# Patient Record
Sex: Male | Born: 1940 | Race: White | Hispanic: No | Marital: Married | Smoking: Never smoker
Health system: Southern US, Community
[De-identification: ages and names within clinical notes are randomized; demographics above are authoritative.]

## PROBLEM LIST (undated history)

## (undated) DIAGNOSIS — G600 Hereditary motor and sensory neuropathy: Secondary | ICD-10-CM

## (undated) DIAGNOSIS — IMO0002 Reserved for concepts with insufficient information to code with codable children: Secondary | ICD-10-CM

## (undated) DIAGNOSIS — E785 Hyperlipidemia, unspecified: Secondary | ICD-10-CM

## (undated) DIAGNOSIS — E119 Type 2 diabetes mellitus without complications: Secondary | ICD-10-CM

## (undated) DIAGNOSIS — N3281 Overactive bladder: Secondary | ICD-10-CM

## (undated) DIAGNOSIS — D509 Iron deficiency anemia, unspecified: Secondary | ICD-10-CM

## (undated) DIAGNOSIS — I10 Essential (primary) hypertension: Secondary | ICD-10-CM

## (undated) DIAGNOSIS — M1990 Unspecified osteoarthritis, unspecified site: Secondary | ICD-10-CM

## (undated) DIAGNOSIS — K259 Gastric ulcer, unspecified as acute or chronic, without hemorrhage or perforation: Secondary | ICD-10-CM

## (undated) DIAGNOSIS — K5790 Diverticulosis of intestine, part unspecified, without perforation or abscess without bleeding: Secondary | ICD-10-CM

## (undated) DIAGNOSIS — H9190 Unspecified hearing loss, unspecified ear: Secondary | ICD-10-CM

## (undated) DIAGNOSIS — I209 Angina pectoris, unspecified: Secondary | ICD-10-CM

## (undated) HISTORY — DX: Angina pectoris, unspecified: I20.9

## (undated) HISTORY — DX: Hyperlipidemia, unspecified: E78.5

## (undated) HISTORY — DX: Overactive bladder: N32.81

## (undated) HISTORY — DX: Iron deficiency anemia, unspecified: D50.9

## (undated) HISTORY — DX: Unspecified osteoarthritis, unspecified site: M19.90

## (undated) HISTORY — PX: FOOT AMPUTATION THROUGH METATARSAL: SHX644

## (undated) HISTORY — DX: Diverticulosis of intestine, part unspecified, without perforation or abscess without bleeding: K57.90

## (undated) HISTORY — PX: FOOT SURGERY: SHX648

## (undated) HISTORY — DX: Hereditary motor and sensory neuropathy: G60.0

## (undated) HISTORY — PX: TOTAL HIP ARTHROPLASTY: SHX124

## (undated) HISTORY — DX: Gastric ulcer, unspecified as acute or chronic, without hemorrhage or perforation: K25.9

## (undated) HISTORY — DX: Type 2 diabetes mellitus without complications: E11.9

## (undated) HISTORY — DX: Unspecified hearing loss, unspecified ear: H91.90

## (undated) HISTORY — DX: Essential (primary) hypertension: I10

## (undated) HISTORY — DX: Reserved for concepts with insufficient information to code with codable children: IMO0002

---

## 1997-09-11 HISTORY — PX: TOTAL KNEE ARTHROPLASTY: SHX125

## 1998-06-18 ENCOUNTER — Encounter: Payer: Self-pay | Admitting: Orthopedic Surgery

## 1998-06-18 ENCOUNTER — Inpatient Hospital Stay (HOSPITAL_COMMUNITY): Admission: RE | Admit: 1998-06-18 | Discharge: 1998-06-22 | Payer: Self-pay | Admitting: Orthopedic Surgery

## 1998-06-25 ENCOUNTER — Encounter (HOSPITAL_COMMUNITY): Admission: RE | Admit: 1998-06-25 | Discharge: 1998-09-23 | Payer: Self-pay | Admitting: Orthopedic Surgery

## 1998-07-21 ENCOUNTER — Encounter: Admission: RE | Admit: 1998-07-21 | Discharge: 1998-10-04 | Payer: Self-pay

## 1998-08-17 ENCOUNTER — Encounter: Payer: Self-pay | Admitting: Orthopedic Surgery

## 1998-08-24 ENCOUNTER — Inpatient Hospital Stay (HOSPITAL_COMMUNITY): Admission: RE | Admit: 1998-08-24 | Discharge: 1998-08-28 | Payer: Self-pay | Admitting: Orthopedic Surgery

## 1998-10-04 ENCOUNTER — Encounter: Admission: RE | Admit: 1998-10-04 | Discharge: 1998-11-05 | Payer: Self-pay | Admitting: Orthopedic Surgery

## 1999-10-14 ENCOUNTER — Encounter: Admission: RE | Admit: 1999-10-14 | Discharge: 1999-10-14 | Payer: Self-pay | Admitting: *Deleted

## 1999-10-14 ENCOUNTER — Encounter: Payer: Self-pay | Admitting: *Deleted

## 2000-04-25 ENCOUNTER — Encounter: Admission: RE | Admit: 2000-04-25 | Discharge: 2000-07-24 | Payer: Self-pay | Admitting: Internal Medicine

## 2000-06-12 ENCOUNTER — Encounter: Payer: Self-pay | Admitting: Surgery

## 2000-07-03 ENCOUNTER — Encounter: Payer: Self-pay | Admitting: Orthopedic Surgery

## 2000-07-17 ENCOUNTER — Encounter: Payer: Self-pay | Admitting: Orthopedic Surgery

## 2000-07-24 ENCOUNTER — Encounter: Admission: RE | Admit: 2000-07-24 | Discharge: 2000-10-22 | Payer: Self-pay | Admitting: Orthopedic Surgery

## 2000-10-18 ENCOUNTER — Encounter: Admission: RE | Admit: 2000-10-18 | Discharge: 2000-10-18 | Payer: Self-pay | Admitting: Internal Medicine

## 2000-10-18 ENCOUNTER — Encounter: Payer: Self-pay | Admitting: Internal Medicine

## 2000-10-23 ENCOUNTER — Encounter: Admission: RE | Admit: 2000-10-23 | Discharge: 2001-01-21 | Payer: Self-pay | Admitting: Orthopedic Surgery

## 2000-11-29 ENCOUNTER — Encounter: Payer: Self-pay | Admitting: Orthopedic Surgery

## 2000-11-29 ENCOUNTER — Ambulatory Visit (HOSPITAL_COMMUNITY): Admission: RE | Admit: 2000-11-29 | Discharge: 2000-11-29 | Payer: Self-pay | Admitting: Orthopedic Surgery

## 2001-01-20 ENCOUNTER — Encounter: Payer: Self-pay | Admitting: Orthopedic Surgery

## 2001-01-20 ENCOUNTER — Ambulatory Visit (HOSPITAL_COMMUNITY): Admission: RE | Admit: 2001-01-20 | Discharge: 2001-01-20 | Payer: Self-pay | Admitting: Orthopedic Surgery

## 2001-01-29 ENCOUNTER — Encounter: Admission: RE | Admit: 2001-01-29 | Discharge: 2001-04-10 | Payer: Self-pay | Admitting: Orthopedic Surgery

## 2001-02-01 ENCOUNTER — Encounter: Payer: Self-pay | Admitting: Orthopedic Surgery

## 2001-02-06 ENCOUNTER — Inpatient Hospital Stay (HOSPITAL_COMMUNITY): Admission: RE | Admit: 2001-02-06 | Discharge: 2001-02-08 | Payer: Self-pay | Admitting: Orthopedic Surgery

## 2001-02-06 ENCOUNTER — Encounter: Payer: Self-pay | Admitting: Orthopedic Surgery

## 2001-02-08 ENCOUNTER — Inpatient Hospital Stay (HOSPITAL_COMMUNITY)
Admission: RE | Admit: 2001-02-08 | Discharge: 2001-02-12 | Payer: Self-pay | Admitting: Physical Medicine & Rehabilitation

## 2001-02-20 ENCOUNTER — Encounter
Admission: RE | Admit: 2001-02-20 | Discharge: 2001-04-04 | Payer: Self-pay | Admitting: Physical Medicine & Rehabilitation

## 2001-05-09 ENCOUNTER — Ambulatory Visit (HOSPITAL_BASED_OUTPATIENT_CLINIC_OR_DEPARTMENT_OTHER): Admission: RE | Admit: 2001-05-09 | Discharge: 2001-05-09 | Payer: Self-pay | Admitting: Orthopedic Surgery

## 2001-08-06 ENCOUNTER — Ambulatory Visit: Admission: RE | Admit: 2001-08-06 | Discharge: 2001-08-06 | Payer: Self-pay | Admitting: Orthopedic Surgery

## 2001-08-16 ENCOUNTER — Encounter: Admission: RE | Admit: 2001-08-16 | Discharge: 2001-11-14 | Payer: Self-pay

## 2001-08-23 ENCOUNTER — Encounter: Admission: RE | Admit: 2001-08-23 | Discharge: 2001-11-21 | Payer: Self-pay

## 2001-10-31 ENCOUNTER — Encounter: Admission: RE | Admit: 2001-10-31 | Discharge: 2001-10-31 | Payer: Self-pay | Admitting: Internal Medicine

## 2001-10-31 ENCOUNTER — Encounter: Payer: Self-pay | Admitting: Internal Medicine

## 2002-01-20 ENCOUNTER — Encounter: Admission: RE | Admit: 2002-01-20 | Discharge: 2002-04-20 | Payer: Self-pay

## 2002-05-01 ENCOUNTER — Encounter: Admission: RE | Admit: 2002-05-01 | Discharge: 2002-06-10 | Payer: Self-pay

## 2002-06-18 ENCOUNTER — Encounter: Payer: Self-pay | Admitting: Orthopedic Surgery

## 2002-06-20 ENCOUNTER — Inpatient Hospital Stay (HOSPITAL_COMMUNITY): Admission: RE | Admit: 2002-06-20 | Discharge: 2002-06-25 | Payer: Self-pay | Admitting: Orthopedic Surgery

## 2002-06-20 ENCOUNTER — Encounter: Payer: Self-pay | Admitting: Orthopedic Surgery

## 2002-06-23 ENCOUNTER — Encounter: Payer: Self-pay | Admitting: Orthopedic Surgery

## 2003-06-09 ENCOUNTER — Encounter
Admission: RE | Admit: 2003-06-09 | Discharge: 2003-09-07 | Payer: Self-pay | Admitting: Physical Medicine & Rehabilitation

## 2003-06-16 ENCOUNTER — Encounter
Admission: RE | Admit: 2003-06-16 | Discharge: 2003-07-07 | Payer: Self-pay | Admitting: Physical Medicine & Rehabilitation

## 2003-11-16 ENCOUNTER — Encounter
Admission: RE | Admit: 2003-11-16 | Discharge: 2004-02-14 | Payer: Self-pay | Admitting: Physical Medicine & Rehabilitation

## 2003-12-30 ENCOUNTER — Encounter (HOSPITAL_BASED_OUTPATIENT_CLINIC_OR_DEPARTMENT_OTHER): Admission: RE | Admit: 2003-12-30 | Discharge: 2004-01-05 | Payer: Self-pay | Admitting: Internal Medicine

## 2004-03-11 ENCOUNTER — Encounter
Admission: RE | Admit: 2004-03-11 | Discharge: 2004-06-09 | Payer: Self-pay | Admitting: Physical Medicine & Rehabilitation

## 2008-03-06 ENCOUNTER — Encounter
Admission: RE | Admit: 2008-03-06 | Discharge: 2008-05-01 | Payer: Self-pay | Admitting: Physical Medicine & Rehabilitation

## 2008-03-10 ENCOUNTER — Ambulatory Visit: Payer: Self-pay | Admitting: Physical Medicine & Rehabilitation

## 2008-04-02 ENCOUNTER — Encounter
Admission: RE | Admit: 2008-04-02 | Discharge: 2008-04-02 | Payer: Self-pay | Admitting: Physical Medicine & Rehabilitation

## 2008-04-02 ENCOUNTER — Ambulatory Visit: Payer: Self-pay | Admitting: Physical Medicine & Rehabilitation

## 2008-05-01 ENCOUNTER — Ambulatory Visit (HOSPITAL_COMMUNITY)
Admission: RE | Admit: 2008-05-01 | Discharge: 2008-05-01 | Payer: Self-pay | Admitting: Physical Medicine & Rehabilitation

## 2008-05-01 ENCOUNTER — Ambulatory Visit: Payer: Self-pay | Admitting: Physical Medicine & Rehabilitation

## 2008-12-22 ENCOUNTER — Encounter: Admission: RE | Admit: 2008-12-22 | Discharge: 2008-12-22 | Payer: Self-pay | Admitting: Family Medicine

## 2009-12-03 ENCOUNTER — Encounter (INDEPENDENT_AMBULATORY_CARE_PROVIDER_SITE_OTHER): Payer: Self-pay | Admitting: Orthopedic Surgery

## 2009-12-03 ENCOUNTER — Inpatient Hospital Stay (HOSPITAL_COMMUNITY): Admission: RE | Admit: 2009-12-03 | Discharge: 2009-12-06 | Payer: Self-pay | Admitting: Orthopedic Surgery

## 2010-10-21 ENCOUNTER — Ambulatory Visit (HOSPITAL_BASED_OUTPATIENT_CLINIC_OR_DEPARTMENT_OTHER)
Admission: RE | Admit: 2010-10-21 | Discharge: 2010-10-21 | Disposition: A | Discharge status: Home / Self Care | Payer: MEDICARE | Source: Ambulatory Visit | Attending: Orthopedic Surgery | Admitting: Orthopedic Surgery

## 2010-10-21 DIAGNOSIS — M9080 Osteopathy in diseases classified elsewhere, unspecified site: Secondary | ICD-10-CM | POA: Insufficient documentation

## 2010-10-21 DIAGNOSIS — E1169 Type 2 diabetes mellitus with other specified complication: Secondary | ICD-10-CM | POA: Insufficient documentation

## 2010-10-21 DIAGNOSIS — M86649 Other chronic osteomyelitis, unspecified hand: Secondary | ICD-10-CM | POA: Insufficient documentation

## 2010-10-21 DIAGNOSIS — G600 Hereditary motor and sensory neuropathy: Secondary | ICD-10-CM | POA: Insufficient documentation

## 2010-10-24 LAB — WOUND CULTURE: Gram Stain: NONE SEEN

## 2010-10-26 LAB — ANAEROBIC CULTURE: Gram Stain: NONE SEEN

## 2010-10-31 NOTE — Op Note (Signed)
  NAMEILYAAS, MUSTO                ACCOUNT NO.:  [id number]  MEDICAL RECORD NO.:  [id number]          PATIENT TYPE:  LOCATION:                                 FACILITY:  PHYSICIAN:  Cindee Salt, M.D.            DATE OF BIRTH:  DATE OF PROCEDURE:  10/21/2010 DATE OF DISCHARGE:                              OPERATIVE REPORT   PREOPERATIVE DIAGNOSIS:  Chronic osteomyelitis, right middle finger.  POSTOPERATIVE DIAGNOSIS:  Chronic osteomyelitis, right middle finger.  OPERATION:  Incision and drainage with cultures, right middle finger.  SURGEON:  Cindee Salt, MD  ANESTHESIA:  Metacarpal block.  HISTORY:  The patient is a 70 year old male with a history of a puncture wound who has right middle finger.  He is diabetic and Charcot-Marie- Tooth, this has gone on to an open area with drainage.  A defect is present in his distal phalanx.  He is admitted now for I and D of the area with debridement, cultures for chronic osteomyelitis.  In the preoperative area, the patient was seen.  The extremity marked by both the patient and surgeon.  He was brought to the operating room where a metacarpal block was given with 1% Xylocaine without epinephrine after being prepped with ChloraPrep.  A Penrose drain was used for tourniquet control.  The area was opened, debrided with sharp dissection, the sinus track was debrided.  This was followed down to the distal phalanx.  A curette was then used to remove bone, cultures were taken, the area was copiously irrigated and packed open.  A sterile compressive dressing and splint of the finger was applied and the patient tolerated the procedure well.  Removal of the Penrose drain, he was taken to the recovery room for discharge to home on Vicodin and doxycycline which he has been taken.          ______________________________ Cindee Salt, M.D.     GK/MEDQ  D:  10/21/2010  T:  10/22/2010  Job:  [id number]  Electronically Signed by Cindee Salt M.D. on  10/31/2010 02:25:51 PM

## 2010-12-02 LAB — PROTIME-INR
INR: 1.12 (ref 0.00–1.49)
INR: 1.52 — ABNORMAL HIGH (ref 0.00–1.49)
Prothrombin Time: 13.8 seconds (ref 11.6–15.2)
Prothrombin Time: 14.3 seconds (ref 11.6–15.2)
Prothrombin Time: 15.4 seconds — ABNORMAL HIGH (ref 11.6–15.2)

## 2010-12-02 LAB — CBC
Hemoglobin: 13.1 g/dL (ref 13.0–17.0)
MCV: 87.8 fL (ref 78.0–100.0)
RBC: 4.45 MIL/uL (ref 4.22–5.81)
WBC: 12.8 10*3/uL — ABNORMAL HIGH (ref 4.0–10.5)

## 2010-12-02 LAB — COMPREHENSIVE METABOLIC PANEL
ALT: 25 U/L (ref 0–53)
AST: 24 U/L (ref 0–37)
CO2: 25 mEq/L (ref 19–32)
Chloride: 104 mEq/L (ref 96–112)
GFR calc Af Amer: >60 mL/min (ref >60)
GFR calc non Af Amer: >60 mL/min (ref >60)
Glucose, Bld: 154 mg/dL — ABNORMAL HIGH (ref 70–99)
Sodium: 137 mEq/L (ref 135–145)
Total Bilirubin: 0.6 mg/dL (ref 0.3–1.2)

## 2010-12-02 LAB — GLUCOSE, CAPILLARY
Glucose-Capillary: 118 mg/dL — ABNORMAL HIGH (ref 70–99)
Glucose-Capillary: 143 mg/dL — ABNORMAL HIGH (ref 70–99)
Glucose-Capillary: 151 mg/dL — ABNORMAL HIGH (ref 70–99)
Glucose-Capillary: 159 mg/dL — ABNORMAL HIGH (ref 70–99)
Glucose-Capillary: 195 mg/dL — ABNORMAL HIGH (ref 70–99)

## 2010-12-02 LAB — HEMOGLOBIN A1C
Hgb A1c MFr Bld: 10.5 % — ABNORMAL HIGH (ref 4.6–6.1)
Mean Plasma Glucose: 255 mg/dL

## 2010-12-07 LAB — AFB CULTURE WITH SMEAR (NOT AT ARMC)

## 2011-01-24 NOTE — Assessment & Plan Note (Signed)
HISTORY OF PRESENT ILLNESS:  Christian [name] is back after a 4-year absence.  I  followed him some time for his low back pain.  He had radiofrequency  ablations performed at L2-S1 bilaterally by Dr. Stevphen Rochester in the spring of  2005 and had excellent results.  I had him come back and see me on an as  needed basis.  His wife began to tell me that his pain has begun to  increase.  He reports over the last 2 years or so, the pain has  increased to the point where he is limited with his activities.  He has  packed off some of the things such as woodcutting and heavy-duty jobs,  but some other household activities and work in the yard seem to  irritate his back.  He also reports pain in both shoulders, particularly  with overhead movements, twisting of the shoulders, etc.  He had been  seen by Dr. Lajoyce Corners in the past for injections in the shoulders.  He rates  his pain as 7-9/10.  He describes it as sharp, tingling, aching, and  constant.  Pain interferes with his general activity, relations with  others, enjoyment of life on a moderate-to-severe level.  He tends to  have more pain with prolonged standing and then bending at times, as it  pertains to the low back.   CURRENT MEDICATIONS:  For pain include diclofenac 75 mg twice a day,  Flexeril 3 times a day as needed, Relafen 750 mg twice a day as needed,  and tramadol 50 mg 2 tablets 3 times a day as needed.   ALLERGIES:  None.   REVIEW OF SYSTEMS:  Notable for the above as well as occasional spasms,  high sugars, wheezing, and shortness of breath.  The patient remains  legally deaf.   SOCIAL HISTORY:  The patient lives with his wife, who is a patient of  mine, and they are very supportive to each other.   PHYSICAL EXAMINATION:  VITAL SIGNS:  Blood pressure is 147/66, pulse 85,  respiratory rate 20, and sating 96% on room air.  GENERAL:  The patient is pleasant, alert, and oriented x3.  Affect is  appropriate.  He has gained some weight, especially  abdominally, since I  last saw him.  EXTREMITIES:  Reflexes are 2+.  Sensation is intact.  Good coordination  overall.  The patient has definite pain with palpation of the paraspinal  facets of L4-L5 and L5-S1.  Facet maneuver seemed to provoke most of the  pain.  He had some pain with extension and mild pain with flexion today.  Strength was 5/5 in both legs with 2+ reflexes.  Sensation was normal.  On straight leg testing, he had tightness in his hamstring muscles only.  Pelvic, back posture was fair considering his gait deficits and stance  deficits.  The shoulders were slightly tender to palpation today,  particularly in the lateral/posterior aspect.  Left was more so than  right.  He had positive impingement signs in both shoulders today.  Strength was generally intact except for pain inhibition at the left  shoulder.  HEART:  Regular.  CHEST:  Clear.  ABDOMEN:  Soft and nontender.   ASSESSMENT:  1. History of lumbar spinal stenosis and facet arthropathy.  2. Charcot-Marie-Tooth disease.  3. Myofascial pain.  4. Leg length discrepancy.   PLAN:  1. We will set the patient up for medial branch blocks at L4-L5 and L5-      S1 bilaterally  with Dr. Wynn Banker.  I do not see the need to above      these levels at this time.  2. After informed consent, we injected both shoulders via posterior      approach with 40 mg of Kenalog and 3 mL of 1% lidocaine.  The      patient tolerated well.  3. Continue antiinflammatories and Ultram p.r.n.  4. I gave him shoulder exercises to proceed with.  He will likely      benefit from a specific low back therapy      program after his injections.  We will discuss this later.  5. I will see him back after his blocks with Dr. Wynn Banker.      Ranelle Oyster, M.D.  Electronically Signed     ZTS/MedQ  D:  03/10/2008 13:06:36  T:  03/11/2008 06:37:39  Job #:  [id number]

## 2011-01-24 NOTE — Assessment & Plan Note (Signed)
Christian [name] is back after his lumbar medial branch block at L4-L5 and L5-S1.  He has had great response.  His back pain is now 2/10.  He has had  resolution of his right shoulder pain but had return of his left  shoulder pain once again over the last few weeks.  He rates that pain  5/10.  He describes this as dull, constant.  Pain interferes with  general activity, relations with others, enjoyment of life on mild-to-  moderate level.  He does stay active, is outdoors a lot.  His wife is  involved with his every day activities as well.   REVIEW OF SYSTEMS:  The patient reports some trouble walking, weakness,  occasional high sugars, and urine retention.  Otherwise pertinent  positives as above and full review as in the written health and history  section.   SOCIAL HISTORY:  The patient is living with his wife who is supportive.   PHYSICAL EXAMINATION:  VITAL SIGNS:  Blood pressure is 129/60, pulse is  84, respiratory rate 18, and he is sating 95% on room air.  GENERAL:  The patient is pleasant, alert and oriented x3.  He remains  hard of hearing but is able to read lips.  Right shoulder is moving  nicely.  He is able to bend and extend the back without significant  tenderness.  He is walking without too much difficulty today with good  balance, weight shift, etc.  Strength is 5/5 throughout except the left  shoulder which is inhibited from pain.  I was able to passively move the  left shoulder to about 45-50 degrees of abduction.  He has minimal  external rotation and only 5-10 degrees of internal rotation today.  Extension is about 15 degrees.  He has pain over the medial and lateral  compartments of the shoulder.  HEART:  Regular.  CHEST:  Clear.  ABDOMEN:  Soft and nontender.   ASSESSMENT:  1. Lumbar spinal stenosis and facet arthropathy, responding well to      medial branch blocks at L4-L5 and L5-S1.  2. Charcot-Marie-Tooth disease.  3. 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   PLAN:  1. After informed consent, we injected via lateral approach, the left      shoulder to adjust the articular space as well as the subdeltoid      bursa.  The patient tolerated it well.  I will check x-rays as      well.  Depending upon his x-ray results, we will send him to      physical therapy to work on range of motion, as he needs to improve      this substantially.  I think this will improve his quality of life      overall.  2. Continue antiinflammatories and Ultram p.r.n. for breakthrough      pain.  3. I will see him back, pending his x-ray and therapy plan.      Christian [name], M.D.  Electronically Signed     ZTS/MedQ  D:  05/01/2008 13:54:25  T:  05/02/2008 03:50:08  Job #:  [id number]

## 2011-01-24 NOTE — Procedures (Signed)
NAMEDEJOHN, IBARRA              ACCOUNT NO.:  [id number]   MEDICAL RECORD NO.:  [id number]          PATIENT TYPE:  REC   LOCATION:  [facility]                          FACILITY:  [facility]   PHYSICIAN:  Erick Colace, M.D.DATE OF BIRTH:  05-Dec-1940   DATE OF PROCEDURE:  DATE OF DISCHARGE:                               OPERATIVE REPORT   PROCEDURE:  Bilateral L5 dorsal ramus injection, bilateral L4 medial  branch block, bilateral L3 medial branch block under fluoroscopic  guidance.   INDICATIONS:  Lumbar spondylosis without myelopathy.  Previous good  relief with radiofrequency procedures.  Pain persists despite  conservative care and interferes with self care and mobility.   Informed consent obtained after describing risks and benefits of the  procedure with the patient.  These include bleeding, bruising, and  infection.  He elected to proceed and has given written consent.  The  patient placed prone on fluoroscopy table.  Betadine prep, sterile drape  25-gauge, 1-1/2-inch needle was used to anesthetize the skin and subcu  tissue, 1% lidocaine x2 1.5 mL at each site.  Then a 22-gauge 5-inch  spinal needle was inserted under fluoroscopic guidance first starting  left S1 SAP sacral ala junction.  Bone contact made confirmed with  lateral imaging.  Omnipaque 180 x 0.5 mL demonstrated no intravascular  uptake.  Then 0.5 mL of solution containing 1 mL of 4 mg/mL,  dexamethasone 2 mL of 2% MPF lidocaine were injected.  In the left L5,  SAP transverse process junction targeted bone contact made confirmed  with lateral imaging.  Omnipaque 180 x 0.5 mL demonstrated no  intravascular uptake, then 0.5 mL of the dexamethasone lidocaine  solution was injected.  Then the left L4 SAP transverse process joint  junction targeted bone contact made confirmed with lateral imaging.  Omnipaque 180 x 0.5 mL demonstrated no intravascular uptake and 0.5 mL  dexamethasone lidocaine solution was injected.  The  same procedure was  repeated at the corresponding level on the right side using same needle,  injectate, and technique.  The patient tolerated procedure well.  Pre  and post injection vitals stable.  Postprocedure instructions are given.  Pre injection pain level 8 to 9 and post injection 7 to 8.  Follow up  Dr. Riley Kill.  Determine whether reinjection needed.  Previous good relief  with radiofrequency L2-S1.      Erick Colace, M.D.  Electronically Signed     AEK/MEDQ  D:  04/02/2008 13:27:46  T:  04/03/2008 04:55:58  Job:  [id number]

## 2011-01-27 NOTE — Consult Note (Signed)
[facility]  Patient:    Christian [name], Christian [name] Visit Number: [id number] MRN: [id number]          Service Type: PMG Location: [facility] Attending Physician:  Sondra Come Dictated by:   Sondra Come, D.O. Proc. Date: 02/17/02 Admit Date:  01/20/2002   CC:         Stefani Dama, M.D.   Consultation Report  HISTORY:  The patient returns to the clinic today as scheduled for lumbar epidural steroid injection for spinal stenosis of the lumbar spine without myelopathy.  The patient was last seen on Jan 27, 2002.  He has undergone three lumbar epidural steroid injections over the past six months, with significant improvement in his lower extremity neurogenic claudication.  he continues to complain of pain in his low back with lower extremity radicular pain on activity.  I reviewed the health and history form and 14-point review of systems.  The pain today is a 3-5/10 on a subjective scale.  Overall, his function and quality-of-life indices have improved, but he continues to have intermittent pain related to activity.  He continues taking hydrocodone 7.5 mg/325 mg up to three times daily as needed, and has plenty of pills remaining by his report.  He did not bring his medications with him today.  PHYSICAL EXAMINATION:  GENERAL:  Healthy male in no acute distress.  VITAL SIGNS:  Blood pressure 165/77, pulse 80, respirations 16, O2 saturation 96% on room air.  NEUROLOGIC:  No new changes in the lower extremities including motor, sensory and reflexes.  IMPRESSION: 1. 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 2. Degenerative disk disease of the lumbar spine, severe, without myelopathy. 3. Severe facet arthropathy of the lumbar spine with bilateral L5 pars    defects. 4. Charcot-Marie-Tooth disease with bilateral foot drop, stable.  PLAN: 1. Lumbar epidural steroid injection.  I discussed the implications of    repeated injections with the  patient and his wife to include    steroid-induced osteoporosis.  The patient understands the risks and wishes    to proceed. 2. Continue hydrocodone as needed. 3. The patient is to return to the clinic in one month for reevaluation.  DESCRIPTION OF PROCEDURE:  The procedure was described to the patient including the risks, benefits, limitations and alternatives.  The patient wished to proceed.  Informed consent was obtained.  The patient was brought back to the fluoroscopy suite and placed on the table in the prone position. The skin was prepped and draped in the usual sterile fashion.  The skin and subcutaneous tissues were anesthetized with 3 cc of preservative-free 1% lidocaine.  Under direct fluoroscopic guidance, an 18-gauge, 3-1/2-inch Tuohy needle was advanced into the left paramedian L5-S1 epidural space with loss of resistance technique.  No CSF, heme or paresthesias were noted.  This was then injected with 1 cc of Kenalog, 40 mg/cc plus 2 cc of normal saline with needle flush.  There were no complications.  The patient tolerated the procedure well.  Discharge instructions were given.  The patient was educated in the above findings and recommendations and understands.  There were no barriers to communication. Dictated by:   Sondra Come, D.O. Attending Physician:  Sondra Come DD:  02/17/02 TD:  02/19/02 Job: [id number]

## 2011-01-27 NOTE — Consult Note (Signed)
[facility]  Patient:    Christian [name], Christian [name] Visit Number: [id number] MRN: [id number]          Service Type: PMG Location: [facility] Attending Physician:  Sondra Come Dictated by:   Sondra Come, D.O. Proc. Date: 08/16/01 Admit Date:  08/16/2001   CC:         Christian [name], M.D.   Consultation Report  NEW PATIENT CONSULTATION  REFERRING Christian [name]:  Dr. Stefani [name].  Dear Dr. Danielle [name],  Thank you very much for kindly referring [name] to [facility] for pain management.  Mr. Colin was seen in our clinic today.  Please refer to the following details regarding the history, physical examination and treatment plan.  Once again, thank you for allowing [place] to participate in the care of [name].  CHIEF COMPLAINT:  Back pain.  HISTORY OF PRESENT ILLNESS:  [name] is a pleasant 70 year old right-hand-dominant male who has a significant hearing impairment and is accompanied by his wife who helps with signing.  Patient has a history of Charcot-Marie-Tooth disease with gait abnormality and a long history of low back pain.  Patients back pain is non-radicular in nature.  He has been evaluated by Dr. Danielle [name] and is not a surgical candidate at this point. Patients current symptoms are a 10/10 on a subjective scale and described as sharp.  His back pain is worse with bending, working and especially with standing and walking.  His symptoms are improved with rest and medications which include hydrocodone as needed, as well as OxyContin as needed.  The patient is unsure of the hydrocodone dose and did not bring his medicines with him.  He has taken OxyContin, approximately two pills per week.  He also takes diclofenac daily.  His function and quality-of-life indices have declined. His sleep is fair to poor.  He denies bowel and bladder dysfunction with the exception of urinary urgency.  Denies any radicular  pain, numbness or paresthesias.  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 There is mild-to-moderate spinal stenosis noted at L3-4 and L4-5.  There is facet joint degenerative changes bilaterally in the lower lumbar region.  I review health and history form and 14-point review of systems.  PAST MEDICAL HISTORY:  Charcot-Marie-Tooth disease with lower extremity weakness distally and history of falls.  Osteoarthritis, bilateral knees and ankles.  Diabetes and hypertension.  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  Bilateral first metatarsophalangeal fusions.  Bilateral Achilles lengthenings.  FAMILY HISTORY:  Charcot-Marie-Tooth disease, cancer, diabetes, disability and hypertension.  SOCIAL HISTORY:  Denies smoking or alcohol use.  He is married and is not currently working.  ALLERGIES:  No known drug allergies.  MEDICATIONS:  Glucophage, glyburide, Norvasc, diclofenac, lorazepam as needed, hydrocodone as needed and OxyContin as needed.  PHYSICAL EXAMINATION:  GENERAL:  Physical examination reveals a healthy male in no acute distress. He is able to read lips.  Mood and affect are appropriate.  VITAL SIGNS:  Blood pressure is 146/81, pulse 73, respirations 16, O2 saturation 97% on room air.  NEUROMUSCULAR:  Gait is wide-based and unsteady with mild toe drag bilaterally.  Patient has difficulty maintaining balance while standing. Examination of the back  reveals decreased lumbar lordosis with a level pelvis and no gross scoliosis.  Range of motion of the lumbar spine is somewhat difficult to evaluate secondary to poor balance, although patients range of motion is essentially full without  significant discomfort on flexion.  There is discomfort on extension.  Manual muscle testing is 5/5, bilateral hip flexors, knee extensors, with 1/5 ankle dorsiflexors, 3/6 ankle plantar flexors, and extensor hallucis longi are not tested secondary to toe fusions. Sensory examination reveals decreased light touch in a stocking distribution. Muscle stretch reflexes are 0/4, bilateral patellar, medial hamstrings and Achilles.  Straight leg raise is negative bilaterally.  FABER is negative bilaterally.  Distal pulses are diminished slightly bilaterally in the feet. There is no heat, erythema or edema in the lower extremities bilaterally. Range of motion of the ankles reveals 10 degrees of ankle dorsiflexion and 15 degrees of ankle plantar flexion bilaterally passively.  There is also noted intrinsic hand muscle atrophy bilaterally and intrinsic foot muscle atrophy.  IMPRESSION: 1. 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 2. 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 3. 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 4. Charcot-Marie-Tooth disease with bilateral foot drop and fall risk. 5. 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  PLAN: 1. Extensive consultation, greater than 45 minutes, with thorough discussion    regarding patients diagnoses and treatment options. 2. Will prescribe physical therapy for range of motion, neutral with    flexion-based lumbopelvic stabilization exercises, gait training and home    exercise program two or three times per week x 4 weeks. 3. Trial of lumbar epidural steroid injections.  Procedure was described to    patient in detail including risks, benefits, limitations and alternatives.    Patient understands and wishes to proceed in this fashion.  Will use    injections to help facilitate good therapy program. 4. Continue current medications at this time.  May be able to decrease    narcotic usage with the epidural steroids.   Would consider discontinuing     OxyContin in the future, since he is using it only on an as-needed basis.    Would consider alternative narcotic analgesia or nonnarcotic analgesia as    predicated upon patients response. 5. Recommend bilateral ankle orthoses, given patients ankle dorsiflexors    weakness and toe drag during gait.  Discussed this with patient at length    and he agrees to these.  Will give prescription for patient to be seen at    [facility] with fabrication of custom-molded ankle-foot orthoses. 6. Patient to return to clinic for trial of lumbar epidural steroid    injections.  Would also consider facet blocks in the future if he has no    significant response to the lumbar epidural steroids.  Patient was educated in the above findings and recommendations and understands.  Barriers to communication include impaired hearing, although patients wife was present and helped communicate with patient through sign language.  Patient also is able to read lips.  Patient had no further questions at the end of the exam and understood all that was discussed. Dictated by:   Sondra Come, D.O. Attending Physician:  Sondra Come DD:  08/16/01 TD:  08/17/01 Job: [id number]

## 2011-01-27 NOTE — Consult Note (Signed)
[place]. [facility]  Patient:    Christian [name], Christian [name] Visit Number: [id number] MRN: [id number]          Service Type: PMG Location: [facility] Attending Physician:  Sondra Come Dictated by:   Sondra Come, D.O. Proc. Date: 01/27/02 Admit Date:  01/20/2002   CC:         Stefani Dama, M.D.   Consultation Report  Christian [name] returns to clinic today as scheduled for reevaluation.  He was last seen on October 23, 2001.  In the interim he has been doing quite well in terms of his low back pain.  He has had significant improvement with three lumbar epidural steroid injections performed in December 2002 and January 2001.  Patients pain today is a 4-6/10 on a subjective scale.  His function and quality of life indexes have improved significantly, although he states he continues to get onset of his low back pain after walking approximately one-half mile.  He feels like his pain is slowly returning back towards baseline, although continues to be very much improved compared to his pain level prior to injections.  Patient requests to repeat his epidural steroid injections and we discussed this at length.  I think it would be beneficial to wait another three to four weeks which will place [place] at the sixth month mark. I reviewed the health and history form and 14 point review of systems.  No new neurologic complaints.  PHYSICAL EXAMINATION  GENERAL:  Healthy male in no acute distress.  VITAL SIGNS:  Blood pressure 166/90, pulse 81, respirations 16, O2 saturation 97% on room air.  BACK:  There is tenderness to palpation bilateral lumbar paraspinals.  NEUROLOGIC:  Manual muscle testing is 5/5 bilateral hip flexors, knee extensors, 1/5 bilateral ankle dorsiflexors, 3/5 ankle plantar flexors. Muscle stretch reflexes are 0/4 bilateral lower extremities.  Sensory examination reveals decreased light touch in the stocking distribution.  IMPRESSION: 1. 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 2. Degenerative disk disease of the lumbar spine, severe, without myelopathy. 3. 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 5. 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  PLAN: 1. Will hold off on lumbar epidural steroid injection at this time.  Patient    to return to clinic in three weeks for reevaluation.  Will consider    performing injection at that time if patients symptoms continue to worsen. 2. Continue hydrocodone just as needed for severe pain.  New prescription for    Norco 7.5/325 mg one p.o. up to t.i.d. p.r.n. #60 without refills.  Patient was educated on the above findings and recommendations and understands.  There were no barriers to communication. Dictated by:   Sondra Come, D.O. Attending Physician:  Sondra Come DD:  01/27/02 TD:  01/28/02 Job: [id number]

## 2011-01-27 NOTE — Discharge Summary (Signed)
[place]. [facility]  Patient:    Christian [name], Christian [name]                     MRN: [id number] Adm. Date:  [id number] Disc. Date: [id number] Attending:  Herold Harms Dictator:   Mcarthur Rossetti. Angiulli, P.A. CC:         Nadara Mustard, M.D.  Lilly Cove, M.D.   Discharge Summary  DISCHARGE DIAGNOSES: 1. 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 2. Anemia. 3. Charcot-Marie-Tooth disease. 4. Hypertension. 5. Diabetes mellitus. 6. Hard of hearing with deafness. 7. History of bilateral total knee replacements.  HISTORY OF PRESENT ILLNESS:  This is a 70 year old, white male with history of Charcot-Marie-Tooth bilateral total knee replacement three years ago who now presents with progressive right hip pain with no relief from conservative care.  She underwent a right total hip arthroplasty on May 29, per Dr. Lajoyce Corners. She was placed on Coumadin for deep venous thrombosis prophylaxis and touchdown weightbearing and postoperative fever with pain management.  She had minimal assistance for bed mobility, moderate assistance for ambulation, no chest pain or shortness of breath.  Latest INR was 1.6, hemoglobin 10.8. Chest x-ray with no active disease.  She was admitted for comprehensive rehabilitation program.  PAST MEDICAL HISTORY:  See discharge diagnoses.  PAST SURGICAL HISTORY: 1. Bilateral total knee replacement. 2. Tonsillectomy. 3. Appendectomy. 4. Primary doctor is Lilly Cove, M.D.  ALLERGIES:  No known drug allergies.  SOCIAL HISTORY:  No alcohol or tobacco use.  [place]  He used a walker prior to admission.  One-level home with three steps to entry.  Wife can assist on discharge.  MEDICATIONS: 1. Glucophage 850 mg daily. 2. Glyburide 5 mg daily. 3. Norvasc 5 mg daily. 4. Lorazepam 1 mg as needed. 5. Vioxx as needed.  HOSPITAL COURSE:  The patient did well on rehabilitation services with therapies initiated on  a b.i.d. basis.  The following issues were followed during patients rehabilitation course:  Pertaining to Mr. Speir right total hip arthroplasty, this remained stable.  Surgical site is healing and still had a small amount of drainage, no odor.  He remained afebrile.  His staples remained intact and he would follow up with Dr. Lajoyce Corners in one week.  He was touchdown weightbearing with hip precautions noted.  His wife had been through full family teaching.  He continued on Coumadin for deep venous thrombosis prophylaxis.  His primary physician, Dr. Karilyn Cota, had been contacted at [phone], who would follow his Coumadin per protocol.  He would follow up in his office for necessary prothrombin times.  His postoperative anemia was stable at 9.3 and 27 for hematocrit.  He continued on his iron supplement twice daily.  He had no nausea or vomiting while on this medication.  His blood pressures remained controlled on Norvasc 5 mg daily. This was his home regimen.  He would follow up with his primary M.D.  His blood sugars were relatively controlled at 118 and 121.  He was a diabetic diet.  His Glyburide and Glucophage were ongoing again as prior to hospital admission.  He will continue to check his blood sugars twice daily at Dr. Balinda Quails request.  He had no bowel or bladder disturbances.  He was using some laxatives felt to be constipation secondary to his narcotics that did resolve with laxative assistance.  The patient was hard of hearing and deafness.  He was able to read  lips to a certain extent.  His wife would provide the necessary care on discharge.  Overall for his functional mobility, he was ambulating independently in his room and essentially independent on standby assist with activities of daily living with dressing, grooming and homemaking.  The plan was for outpatient therapies as advised and follow with Dr. Lajoyce Corners for removal of staples.  LABORATORY DATA AND X-RAY FINDINGS:  On June 4,  labs showed INR of 2.6 with goal INR range of 2.0 to 3.0.  Chemistries with sodium 137, potassium 4.0, BUN 11, creatinine 0.8.  Liver function studies were unremarkable.  Latest hemoglobin was 9.3, hematocrit 27.  DISCHARGE MEDICATIONS: 1. Coumadin daily with dose to be established at the time of discharge.  He    would complete Coumadin protocol. 2. Norvasc 5 mg daily. 3. Trinsicon twice daily. 4. Glucophage 500 mg two tablets twice daily. 5. Glyburide 5 mg daily. 6. OxyContin CR 10 mg every 12 hours x 1 week. 7. Vicodin as needed for pain.  ACTIVITY:  Touchdown weightbearing with walker and hip precautions.  DIET:  1800 calorie ADA diet.  FOLLOWUP:  Follow up with Dr. Lajoyce Corners in one week for removal of staples. Dr. Balinda Quails office in three days for prothrombin time.  SPECIAL INSTRUCTIONS:  He was advised of no aspirin or ibuprofen while on Coumadin. DD:  02/12/01 TD:  02/12/01 Job: [id number]

## 2011-01-27 NOTE — Op Note (Signed)
[place]. [facility]  Patient:    Christian [name], Christian [name]                     MRN: [id number] Proc. Date: 02/06/01 Adm. Date:  [id number] Attending:  Aldean Baker V                           Operative Report  PREOPERATIVE DIAGNOSIS:  Osteoarthritis, right hip.  POSTOPERATIVE DIAGNOSIS:  Osteoarthritis, right hip.  OPERATION PERFORMED:  Right total hip arthroplasty with press-fit Osteonic secure-fit components with a 60 mm acetabulum, 10 degree poly liner, press-fit C-taper head, 32 mm Alumina ceramic head with a #9 press-fit stem.  SURGEON:  Nadara Mustard, M.D.  ANESTHESIA:  General endotracheal.  ESTIMATED BLOOD LOSS:  700 cc.  ANTIBIOTICS:  1 gm of Kefzol.  DRAINS:  None.  COMPLICATIONS:  None.  DISPOSITION:  To PACU in stable condition.  INDICATIONS FOR PROCEDURE:  The patient is a 70 year old gentleman who has had severe osteoarthritis in his right hip.  The patient was unable to work due to right hip pain.  He has had inability to ambulate without two crutches and presents at this time for total hip replacement.  The risks and benefits were discussed and the patient states that he understands and wishes to proceed at this time.  DESCRIPTION OF PROCEDURE:  The patient was brought to the operating room 5 and underwent a general anesthetic.  After adequate level of anesthesia was obtained, the patient was placed in the left lateral decubitus position with the right side up and the right lower extremity was prepped using DuraPrep and draped in a sterile field with Ioban covering all exposed skin.  A posterolateral incision was made and this was carried down to the tensor fascia lata.  A Charnley retractor was placed.  The piriformis was cut and tagged and retracted.  The short external rotators were cut and retracted. The capsule was Td and the capsule was tagged and preserved.  There was significant amount of large osteophytes laterally and these  were removed in order to dislocate the head.  Then a femoral neck cut was made and attention was first focused on the acetabulum.  There were large osteophytic bone spurs on both the anterior, posterior and superior.  The acetabulum was reamed to a 60 mm acetabulum.  The acetabulum was press-fit with a good fit.  The osteophytes were then removed flush with the acetabulum.  Attention was then focused on the femur.  The femur was sequentially reamed up to a 9.  This was distally reamed up to 15.5 for a 15 mm distal tip. The hip was placed with trials and was placed through a full range of motion.  He had 100 degrees of flexion, had full extension.  He had full extension.  He had full adduction and internal rotation of 45 degrees with a stable hip. He had external rotation, full extension.  The hip was stable.  There was a negative shuck. The temporary broaches were then removed.  The wound was irrigated with normal saline with pulse lavage.  The ____________ was placed center in the acetabular cap.  The 10 degree poly liner was placed.  Again the wound was irrigated with pulse lavage and normal saline.  The femoral component was placed with the ceramic 32 mm head.  The hip was reduced, placed through a full range of motion.  There was no  instability.  The remainder of the pulse lavage was used for irrigation.  The capsule was then repaired.  The short external rotators and the piriformis were repaired.  The tensor fascia lata was then repaired using #1 Vicryl.  Short external rotators were repair using #1 Ethibond.  The deep fascia was closed with #1 Vicryl.  Subcutaneous tissue was closed with 2-0 Vicryl.  Skin was closed with Proximate staples.  The wound was covered with Adaptic orthopedic sponges, ABD and Hypafix tape.  The patient was extubated and taken to PACU in stable condition. DD:  02/06/01 TD:  02/06/01 Job: [id number]

## 2011-01-27 NOTE — Procedures (Signed)
NAME:  Christian, [name]                        ACCOUNT NO.:  [id number]   MEDICAL RECORD NO.:  [id number]                   PATIENT TYPE:  REC   LOCATION:  [facility]                                  FACILITY:  [facility]   PHYSICIAN:  Celene Kras, MD                     DATE OF BIRTH:  1941-04-07   DATE OF PROCEDURE:  02/02/2004  DATE OF DISCHARGE:                                 OPERATIVE REPORT   [name].  I evaluated,  reviewed the health and history form and 14-point review of systems.   1. His wife is present, acting as an interpreter.  There are no barriers to     communication.  We have ascertained that he has improved significantly to     the left side, and we thought about holding off doing the right side but     he has problematic pain to the right.  He has noted degenerative disease     in the lower lumbar segments with a facetal overlay spurring, any     impairment in function and quality of life  indices secondary to pain.     He does not want to escalate narcotic based pain medications.  He wants     to remain active and asked about mowing his lawn today or tomorrow.  This     is the type of individual and will try to help with his function as much     as possible.  2. I will go ahead and proceed with radiofrequency neural ablation L5-S1, 4-     5, 3-4, 2-3, 1-2.  I choose 1-2 as  he does have contributory innervation     here, and reviewed this with him.  He does recall risks, complications     and options attached to chart.  3. I do not plan any more procedures, assisted by Dr. Wynn Banker.  Follow-up     with Dr. Wynn Banker and predicate any further injection based on need and     overall response, functional decline, and need for escalated control     substances.   OBJECTIVE:  No significant change in neurological, musculoskeletal  presentation with the exception of left-sided pain is improved, right side  is most  problematic.   IMPRESSION:  1. Degenerative spinal disease lumbar spine.  2. Facet syndrome.   PLAN:  Facet neurotomy, medial branch, radiofrequency neural ablation, L5-  S1, 4-5, 3-4, 2-3 and 1-2 independent needle access points, right side under  local anesthetic.  He is consented.  Dr. Wynn Banker assists.   The patient taken to fluoroscopy suite and placed in prone position with  back prepped and draped in usual fashion.  Using a 22-gauge 10 mm active tip  RF needle, I advanced the facet medial branch at L5-S1, 4-5, 3-4, 2-3 and 1-  2 right side independent  needle access points and confirmed placement in  multiple fluoroscopic positions.  Dr. Wynn Banker to assist at 1-2.  We used  Isovue 200.  We appropriately stimulate motor and sensory. We then follow  with 1 mL lidocaine, 1% NPF at each level with a total of 40 mg of  Aristocort in divided dose.   Lesion is performed at 70 degrees for 70 seconds with no patient discomfort,  and he tolerated this well.  Appropriate recovery.  No complications  identified.  We will see him in follow-up in one month.  Discharge  instructions communicated through wife and to the patient.                                                Celene Kras, MD    HH/MEDQ  D:  02/02/2004 12:06:06  T:  02/02/2004 13:40:41  Job:  [id number]

## 2011-01-27 NOTE — Discharge Summary (Signed)
   NAME:  Christian [name], Christian [name]                        ACCOUNT NO.:  [id number]   MEDICAL RECORD NO.:  [id number]                   PATIENT TYPE:  INP   LOCATION:  5037                                 FACILITY:  [facility]   PHYSICIAN:  Nadara Mustard, M.D.                DATE OF BIRTH:  07-30-1941   DATE OF ADMISSION:  06/20/2002  DATE OF DISCHARGE:  06/25/2002                                 DISCHARGE SUMMARY   DIAGNOSIS:  Left hip osteoarthritis.   PROCEDURE:  Left total hip arthroplasty.   DISPOSITION:  Discharged to home in stable condition.   FOLLOWUP:  In the office in two weeks.   HISTORY OF PRESENT ILLNESS:  The patient is a 70 year old gentleman with  chronic left hip pain.  He has failed conservative care.  He has been unable  to perform activities of daily living due to his left hip osteoarthritis and  presents at this time for a left total hip arthroplasty.  He is status post  a right total hip arthroplasty as well.   HOSPITAL COURSE:  Essentially unremarkable.  He underwent a left total hip  arthroplasty on 06/20/02, with a 36 mm ceramic head, 60 mm acetabular  component, with a 10 degree poly-line, #9 femur with a +0 neck.  The patient  was started on IV Kefzol for infection prophylaxis for 24 hours, and was  started on Coumadin for deep venous thrombosis prophylaxis.  The patient's  postoperative course was essentially unremarkable.  He progressed well with  physical therapy.  The patient did have an episode of temperature, and a  chest x-ray was ordered, and felt to be not consistent with pneumonia.  The  patient continued to progress well, was discharged to home in stable  condition on 06/25/02, with followup in the office in two weeks.                                                 Nadara Mustard, M.D.    MVD/MEDQ  D:  07/22/2002  T:  07/22/2002  Job:  [id number]

## 2011-01-27 NOTE — Consult Note (Signed)
[facility]  Patient:    ETAN, VASUDEVAN Visit Number: [id number] MRN: [id number]          Service Type: PMG Location: [facility] Attending Physician:  Sondra Come Dictated by:   Sondra Come, D.O. Proc. Date: 10/23/01 Admit Date:  08/16/2001   CC:         Stefani Dama, M.D.                          Consultation Report  REASON FOR FOLLOWUP:  Mr. Fecher returns to clinic today as scheduled for reevaluation.  He was last seen on September 19, 2001 and underwent his third lumbar epidural steroid injection.  Patient has had significant relief of his lower back and lower extremity pain overall.  Currently, his pain is a 2-3/10 on a subjective scale.  His function and quality-of-life indices have significantly improved.  His sleep is great.  He continues to take hydrocodone 7.5 mg infrequently; in fact, I gave him a prescription for 50 pills on September 05, 2001 and he states he has 12 to 14 of these pills remaining.  He typically takes the medication with increased activity as needed.  He denies any bowel and bladder dysfunction.  His gait is improved.  He still does not feel like he needs ankle-foot orthoses for his lower extremity weakness secondary to Charcot-Marie-Tooth but this should be a consideration in the future if his weakness progresses.  I review health and history form and 14-point review of systems.  PHYSICAL EXAMINATION:  GENERAL:  Physical examination reveals a healthy male in no acute distress. Patient is pleasant and happy.  VITAL SIGNS:  Blood pressure is 171/88, pulse 91, respirations 14, O2 saturation 97% on room air.  NEUROMUSCULAR:  Examination of the back revealed decreased lumbar lordosis with minimal tenderness to palpation in bilateral lumbar paraspinals.  Range of motion is somewhat limited by without significant pain.  Manual muscle testing reveals 5/5 hip flexion and knee extension with 1/5 ankle dorsiflexors and 3/5  ankle plantar flexors.  Muscle stretch reflexes are 0/4, bilateral lower extremities.  Sensory examination reveals decreased light touch in a stocking distribution.  IMPRESSION: 1. 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  Significantly    improved low back pain and lower extremity neurogenic claudication. 2. Degenerative disk disease of the lumbar spine, severe, without myelopathy. 3. 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 4. Charcot-Marie-Tooth disease with bilateral foot drop, stable. 5. 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  PLAN: 1. Patient to continue hydrocodone 7.5 mg as needed for severe pain.  I have    given him a prescription for one p.o. t.i.d., #90 without refills. 2. Patient to continue with activity modification and continue performing    home-based exercise program. 3. Patient to return to clinic in three months for reevaluation, sooner if    needed.  Patient was educated in the above findings and recommendations and understands.  There were no barriers to communication.Dictated by:   Sondra Come, D.O. Attending Physician:  Sondra Come DD:  10/23/01 TD:  10/24/01 Job: [id number]

## 2011-01-27 NOTE — Consult Note (Signed)
[facility]  Patient:    Christian [name], Christian [name] Visit Number: [id number] MRN: [id number]          Service Type: PMG Location: [facility] Attending Physician:  Sondra Come Dictated by:   Sondra Come, D.O. Proc. Date: 09/05/01 Admit Date:  08/16/2001   CC:         Stefani Dama, M.D.   Consultation Report  Mr. Croghan returns to clinic today as scheduled for reevaluation and possible second lumbar epidural steroid injection.  He underwent his first lumbar epidural steroid injection on August 22, 2001 and states he has had significant relief of pain.  His pain is currently a 4/10 on a subjective scale, previously 10/10.  His function and quality of life indexes have significantly improved.  His sleep is great.  He continues to take hydrocodone 10 mg just as needed and has quit taking OxyContin all together.  He has begun physical therapy and complains that some relative extension exercises of the lumbar spine are causing him some discomfort.  He has no problems with neutral spine or flexion based exercises.  His gait has improved significantly.  He was evaluated by the orthotist at [facility] and has decided to hold off on getting any braces at the current time, although we discussed this at length and I still recommend some ankle/foot orthoses for Mr. Cambre secondary to his Charcot Hilda Lias tooth disease and foot drop.  He has not fallen.  I review health and history form and 14 point review of systems.  No new neurologic complaints.  PHYSICAL EXAMINATION  GENERAL:  Healthy male in no acute distress.  VITAL SIGNS:  Blood pressure 163/91, pulse 81, respirations 16, O2 saturation 96%.  NEUROLOGIC:  No new neurologic findings in the lower extremities including motor, sensory, and reflexes.  IMPRESSION: 1. 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 2. Degenerative disk disease of the lumbar spine, severe, without myelopathy. 3. Facet  arthropathy with bilateral L5 pars defects. 4. Charcot Marie tooth disease with bilateral foot drop. 5. 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  PLAN: 1. Lumbar epidural steroid injection.  Procedure was described to the patient    and informed consent was obtained.  Patient was brought back to the    fluoroscopy suite and placed on the table in prone position.  Skin was    prepped and draped in the usual sterile fashion.  Skin and subcutaneous    tissue was anesthetized with 3 cc of preservative free 1% lidocaine.  Under    direct fluoroscopic guidance a 20 gauge 3.5 inch Tuohy needle was advanced    into the right paramedian L5-S1 epidural space with loss of resistance    technique.  No CSF, heme, or paraesthesias were noted.  This was injected    with 1 cc of preservative free Depo-Medrol 40 mg per cc plus 1 cc of    preservative free normal saline with needle flush.  No complications.    Patient tolerated the procedure well.  Discharge instructions given. 2. Continue hydrocodone as needed for pain.  New prescription for Lortab    7.5/500 mg one p.o. t.i.d. p.r.n. for pain #50 without refills. 3. Continue physical therapy.  A prescription written to avoid extension    exercises, flexion, and neutral exercises only. 4. Patient to return to clinic in two weeks for reevaluation and possible    third lumbar epidural steroid injection.  This will be predicated upon    patients response.  Patient was  educated on the above findings and recommendations and understands.  There were no barriers to communication. Dictated by:   Sondra Come, D.O. Attending Physician:  Sondra Come DD:  09/05/01 TD:  09/05/01 Job: [id number]

## 2011-01-27 NOTE — Assessment & Plan Note (Signed)
MEDICAL RECORD #[id number]   REASON FOR VISIT:  Christian [name] is here in follow-up after his lumbar facet  RF ablations essential at L2-S1 bilaterally.  The patient has had excellent  results with these ablations.  He reports his pain on average at a 2/10 at  this time.  He is walking and performing his activities of daily living  without any significant pain at this point.  He is pleased with his  progress.  He is walking a half to one mile every day.  He is using one to  two Ultram for pain.  He uses very rarely hydrocodone for pain at this  point.  He may use one hydrocodone a week, if that.  He uses Flexeril for  breakthrough spasms.  He notes that repetitive bending will exacerbate his  symptoms a bit but not nearly as much as before.   REVIEW OF SYSTEMS:  The patient denies any chest pain, shortness of breath,  cold, flu, wheezing, or coughing symptoms.  The patient notes occasional  numbness and spasms.  He has hearing loss as noted.  He reports occasional  fevers and sweating with exercise.  He has put on some weight.  The patient  denies any anxiety or depression, agitation, or headaches.  Denies nausea,  vomiting, reflux, diarrhea, or constipation.  No skin changes have been  noted.   PHYSICAL EXAMINATION TODAY:  Blood pressure is 146/81, pulse is 77,  respiratory rate is 18, he is saturating 98% on room air.  The patient had  excellent flexion today and was able to bend to near 90 degrees.  Extension  caused minimal discomfort today.  Facet maneuvers were negative to  equivocal.  The patient had mild palpatory pain along the lower lumbar  facets and paraspinal muscles.  There were no obvious muscle spasms noted  today.  Straight leg testing was negative, leg length was normal.  There was  some mild exoscoliosis of the lumbothoracic spine.   ASSESSMENT:  1. History of lumbar spinal stenosis.  2. 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  3. Degenerative disc disease.  4.  Charcot-Marie-Tooth disease.  5. Myofascial pain.  6. Leg length discrepancy corrected.   PLAN:  1. The patient has done extremely well at this point.  I believe he is ready     for discharge from the pain clinic at this point.  He may come back to     see [place] as needed in the future.  2. I stressed the importance of regular exercise and following his lower     back stretching program.  He is to continue with his shoe insert as well.  3. He may continue with his Ultram for breakthrough pain and use this on an     as-needed basis with hopeful taper to off over the next several months.  4. He may use the hydrocodone for severe pain although I discouraged this.  5. I will see the patient back on a p.r.n. basis.      Ranelle Oyster, M.D.   ZTS/MedQ  D:  03/16/2004 11:12:33  T:  03/16/2004 11:47:19  Job #:  [id number]

## 2011-01-27 NOTE — Consult Note (Signed)
NAME:  GRANT, SWAGER                        ACCOUNT NO.:  [id number]   MEDICAL RECORD NO.:  [id number]                   PATIENT TYPE:  REC   LOCATION:  FOOT                                 FACILITY:  [facility]   PHYSICIAN:  Jonelle Sports. Sevier, M.D.              DATE OF BIRTH:  08-31-1941   DATE OF CONSULTATION:  12/31/2003  DATE OF DISCHARGE:                                   CONSULTATION   REFERRING PHYSICIAN:  Dr. Demetrios Isaacs. [place]weck.   HISTORY:  This 70 year old male, who actually had been a patient in the foot  center a number of years ago, is re-referred now by Dr. Barbee Shropshire for  consideration of multiple calluses on the plantar aspects of the feet, one  of which recently contained a small ulcer which has healed.   The patient has a complex medical history centered around diabetes with  neuropathy and is said also to have the primary neurologic disorder of  Charcot-Marie-Tooth disease as well.  As a result, he has no protective  sensation in his lower extremities.  He has had some mechanical breakdown  secondary to this with Charcot-type deformities of his feet, but these are  currently stable.  He has remarkably had minimal difficulty with ulceration  and so forth in the past, being particularly careful in caring for his feet.   Recently, he has been only in over-the-counter-type inserts for his shoes  and has developed some plantar calluses.  One of these on the lateral aspect  of the right foot had ulcerated and drained briefly but is now covered by an  eschar and appears healed.   He is here now for our further evaluation and advise.   PAST MEDICAL HISTORY:  Past medical history is notable for those things  mentioned above as well as hyperlipidemia and hypertension.   ALLERGIES:  He is not noted to be allergic to any medications.   MEDICATIONS:  His regular medications include Crestor, Norvasc, Flexeril,  Glucophage, lorazepam, Avandia, tramadol, DiaBeta, diclofenac and  Nasacort.   EXAMINATION:  EXTREMITIES:  Examination today is limited to the distal lower  extremities.  The patient's feet are somewhat deformed with evidence of mild-  to-moderate mid-foot Charcot deformity which is inactive and stable  bilaterally.  There is no significant edema.  Skin temperatures are in the  low-normal range but symmetrical bilaterally.  All pulses are palpable and  adequate.  Monofilament testing shows that he is totally insensate  throughout both feet.  His toes are quite rigid and there is some  interdigital maceration.   On the plantar aspects of both feet are several callused areas in  approximately the first and third metatarsal head areas on the left and the  first and third and fifth metatarsal areas on the right, the latter of  which, that is, the fifth metatarsal head area on the right, is covered with  a small eschar.  DISPOSITION:  1. The patient is given a review of information regarding foot care and     diabetes with insensate feet by video with some nurse and physician     reinforcement.  2. The patient is given a prescription to get custom-molded inserts and     extra-depth shoes and we will pursue this.  It is also noted that he has     some inequality in leg length and he had the information to provide to     the orthotist to make appropriate correction for that as well.  3. The aforementioned calluses are all pared and then dremeled to smoothness     without difficulty.  Notably, the eschar underlying the fifth metatarsal     head area on the right, when pared away, reveals no persistent     ulceration.  4. The patient is given the recommendation to use lamb's wool interdigitally     because of his tendency towards stiffening of the toes and maceration in     those areas.  5. He is given nail care by the nurse and will be set up for followup here     on a every-58-month basis for ongoing nail care and reassessment of     calluses at those  times.                                               Jonelle Sports. Cheryll Cockayne, M.D.    RES/MEDQ  D:  12/31/2003  T:  01/01/2004  Job:  [id number]   cc:   Olene Craven, M.D.  [address]  [address]  [place]  [place] [address]  Fax: [phone]

## 2011-01-27 NOTE — Consult Note (Signed)
[facility]  Patient:    FAYETTE, HAMADA Visit Number: [id number] MRN: [id number]          Service Type: PMG Location: [facility] Attending Physician:  Sondra Come Dictated by:   Sondra Come, D.O. Admit Date:  08/16/2001   CC:         Stefani Dama, M.D.   Consultation Report  REASON FOR FOLLOWUP:  Mr. Dusenbery returns to clinic today as scheduled for reevaluation and possible third lumbar epidural steroid injection.  He was last seen on September 05, 2001 and underwent his second lumbar epidural steroid injection for spinal stenosis of the lumbar spine with neurogenic claudication.  Patient states that he has had significant relief with the two lumbar epidural steroid injections and requests a third.  His pain today is a 4/10 on a subjective scale.  His function and quality-of-life indices have significantly improved.  He states he is walking much better and tolerating it much better.  His sleep is great.  He continues to take Lortab as needed and typically with activity.  He has several pills remaining from a prescription written on September 05, 2001 for 50.  He has discontinued OxyContin.  I review health and history form and 14-point review of systems.  No new neurologic complaints.  PHYSICAL EXAMINATION:  GENERAL:  Physical examination reveals a healthy male in no acute distress. He is pleasant.  VITAL SIGNS:  Blood pressure is 151/90, pulse 81, respirations 16, O2 saturation 96% on room air.  NEUROLOGIC:  No new neurological findings in the lower extremities including motor, sensory and reflexes.  IMPRESSION: 1. 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  Improved    neurogenic claudication and low back pain. 2. Degenerative disk disease of the lumbar spine, severe, without myelopathy. 3. 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 4. Charcot-Marie-Tooth disease with bilateral foot drop. 5. 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  PLAN: 1. Lumbar epidural steroid injection.  Procedure was described to patient and    informed consent was obtained.  Patient was brought back to the fluoroscopy    suite and placed on the table in prone position.  Skin was prepped and    draped in the usual sterile fashion.  Skin and subcutaneous tissue were    anesthetized with 3 cc of preservative-free 1% lidocaine.  Under direct    fluoroscopic guidance, a 20-gauge 3-1/2-inch Tuohy needle was advanced into    the right paramedian L5-S1 epidural space with loss-of-resistance    technique.  No CSF, heme or paresthesias were noted.  This was injected    with 1 cc of preservative-free Depo-Medrol 40 mg/cc plus 1 cc of    preservative-free normal saline with needle flush.  No complications.    Patient tolerated the procedure well.  Discharge instructions given. 2. Continue hydrocodone as needed for pain. 3. Continue home exercise program. 4. Patient to return to clinic in four weeks for reevaluation.  If low back    pain symptoms persist or worsen, would consider a trial of lumbar facet    blocks to rule out facet component of patients pain.  Patient was educated in the above findings and recommendations and understands.  There were no barriers to communication.  Patients wife was present for interpretation through sign language as needed. Dictated by:   Sondra Come, D.O. Attending Physician:  Sondra Come DD:  09/19/01 TD:  09/19/01 Job: [id number]

## 2011-01-27 NOTE — H&P (Signed)
[facility]  Patient:    Christian [name], Christian [name]                       MRN: [id number] Attending:  Donnie Coffin. Samuella Cota, M.D.                         History and Physical  CHIEF COMPLAINT:  Neuropathic ulcer left foot.  HISTORY OF PRESENT ILLNESS:  This 70 year old white male is seen in the clinic because of a neurotropic ulcer of the plantar aspect of the left foot. The patient has been most recently treated by Richard C. Tuchman, D.P.M. The patient has had bilateral Charcot disease and has had surgery by Philips J. Montez Morita, M.D. in the past. He has also had bilateral knee surgery. The patient has had an ulcer now on the plantar surface of his left foot for perhaps two years. The patient has had some evidence of pressure point of the right foot, but he has had no breakdown of the skin on the right side. The patient is seen now regarding a total contact cast.  MEDICATIONS; 1. Glucophage 850 mg b.i.d. 2. Vioxx 25 mg daily. 3. Glyburide 5 mg daily. 4. Norvasc 5 mg daily. 5. Celebrex p.r.n.  ALLERGIES:  None known.  PAST SURGICAL HISTORY:  Bilateral knee replacements, multiple operations on his feet as noted in present illness.  PAST MEDICAL HISTORY:  The patient also is a non-insulin-dependent diabetic diagnosed in 28. He has had some bronchitis and hypertension.  PHYSICAL EXAMINATION:  MUSCULOSKELETAL:  Bilateral knee scars. His right foot is fairly rigid with several scars from previous surgery. He has an area on the plantar surface with a slight change in the skin with underlying bony prominence, but there is no breakdown of the skin. On the left foot, he has an ulcer which measures 20 x 18 x 1 cm. This was debrided. The patient also has an area of breakdown of the skin lateral to this which was debrided. There was really no fluid beneath that.  IMPRESSION: 1. Neurotrophic ulcers plantar surface left foot. 2. History of Charcot disease bilaterally in  the feet.  DISPOSITION:  The patient will be placed in a total contact cast and we will see him back in the office in one week. All of his nails were also trimmed while he was here today. DD:  04/25/00 TD:  04/25/00 Job: [id number]

## 2011-01-27 NOTE — Assessment & Plan Note (Signed)
Christian [name] is back regarding his low back pain.  He notes there has been some  moderate increase since we last talked.  Pain is mostly in the low back, is  worse with working and prolonged standing.  The pain is in the low back  region with some radiation to the flanks but not down to the legs or into  the buttock areas.  Pain improves with rest and with his pain medications  which include Tramadol and Flexeril p.r.n.  He rates his pain at a 4 to 6/10  overall.  He performs his stretches on a fairly regular basis.  He has not  been active with activities outside because of the weather recently.   REVIEW OF SYMPTOMS:  The patient denies any chest pain, shortness of breath,  wheezing or coughing symptoms.  He has had occasional spasms in the back.  He denies weakness and numbness, stroke, confusion, anxiety, depression,  problems with sleep.  He denies nausea, vomiting, reflux, heartburn or  constipation.  He has had no weight changes.  He has had occasional fever or  chills.   PHYSICAL EXAMINATION:  GENERAL APPEARANCE:  The patient is pleasant, in no  acute distress.  VITAL SIGNS:  Blood pressure 151/84, pulse 75, respiratory rate 16,  saturation 99% on room air.   The patient had good lumbar flexion today which was approximately 75 degrees  with minimal pain.  On extension, we were able to provoke low back pain in  his areas that have been generally symptomatic.  Facet maneuvers were  positive.  The patient had palpatory pain, primarily along L4-L5 but also L5-  S1.  Straight leg raising was negative.  Strength was normal.  The patient  had a elevation of his left hemipelvis when compared to the right side.  Lumbar and thoracic spine were bent to the right in compensation as well.  Noted leg length today from ASIS to medial malleolus on either leg and  measured approximately 95 cm on the left and 93 cm on the right side today.  Motor and sensory examinations were normal.   ASSESSMENT:  1. History of lumbar spinal stenosis.  2. Facet arthropathy at L4-L5, L5-S1, right greater than left.  3. Degenerative disc disease.  4. Charcot-Marie Tooth disease.  5. Myofascial pain.  6. Leg length discrepancy (right leg approximately 2 cm shorter than the     left).   PLAN:  1. I will send the patient to [facility] for a custom insert.  He     has an insert currently in the right shoe but essentially this is useless     as it compresses to his baseline  height when he puts weight through the     heel.  2. Will send the patient for facet injections in the low back at L4-L5, L5-     S1 bilaterally with Dr. Wynn Banker.  3. Will continue with Ultram for breakthrough pain at 50 mg q.6h. p.r.n. and     he also may use Flexeril for spasms 5 mg q.8h. p.r.n.  4. Will continue with his range of motion therapies.  5. I will see him back after completion of his injections.      Christian [name], M.D.   ZTS/MedQ  D:  11/18/2003 11:24:23  T:  11/18/2003 11:59:42  Job #:  [id number]   cc:   Christian [name], M.D.  [address]  [place]  [place] [address]  Fax: [phone]

## 2011-01-27 NOTE — H&P (Signed)
   NAME:  Christian [name], Christian [name]                        ACCOUNT NO.:  [id number]   MEDICAL RECORD NO.:  [id number]                   PATIENT TYPE:  INP   LOCATION:  5037                                 FACILITY:  [facility]   PHYSICIAN:  Nadara Mustard, M.D.                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  The patient has failed conservative  treatment and presents at this time for total hip arthroplasty. The patient  is status post bilateral total knees and a right total hip.  The patient  states he is unable to perform activities of daily living due to left hip  pain and presents at this time for left total hip arthroplasty.   ALLERGIES:  No known drug allergies.Marland Kitchen   MEDICATIONS:  Metformin 500 mg p.o. b.i.d., Norvasc 5 mg p.o. q.d., Tramadol  2 p.o. q.d., Glyburide 5 mg p.o. q.d. and diclofenac p.r.n.   PAST SURGICAL HISTORY:  Right total hip arthroplasty on 5/02 and metatarsal  head osteotomies on 04/12/01 for the first and fourth metatarsal head chronic  ulcers.   FAMILY HISTORY:  Positive for heart disease, lung cancer and hypertension.   SOCIAL HISTORY:  Negative for tobacco, negative for alcohol.   REVIEW OF SYSTEMS:  Positive for arthritis, bronchitis, diabetes,  hypertension.  Patient is hard of hearing.   PHYSICAL EXAMINATION:  VITALS : Temperature 98.3, pulse 80, respiratory rate  16, blood pressure 138/76.  GENERAL:  He is in no acute distress.  LUNGS:  Clear to auscultation.  CARDIOVASCULAR:  Regular rate and rhythm.  NECK:  Supple, no bruits.  LEFT HIP:  He does have an abductor lurch.  He has internal rotation of 0  degrees, external rotation to 20 degrees, extension to 0 degrees.  Radiographs show bone on bone contact of the left hip.   ASSESSMENT:  Left hip  osteoarthritis.   PLAN:  Patient is scheduled for a left total hip arthroplasty at this time.  The risks and benefits were discussed, including infection, neurovascular  injury, persistent pain, need for additional surgery, DVT, pulmonary embolus  and death, as well as the risks of dislocation.  Patient states he  understands and wishes to proceed at this time.                                                Nadara Mustard, M.D.    MVD/MEDQ  D:  06/20/2002  T:  06/23/2002  Job:  [id number]

## 2011-01-27 NOTE — Procedures (Signed)
NAME:  Christian [name], Christian [name]                        ACCOUNT NO.:  [id number]   MEDICAL RECORD NO.:  [id number]                   PATIENT TYPE:   LOCATION:                                       FACILITY:   PHYSICIAN:  Celene Kras, MD                     DATE OF BIRTH:  06/11/1941   DATE OF PROCEDURE:  01/05/2004  DATE OF DISCHARGE:                                 OPERATIVE REPORT   HISTORY:  The patient   is a patient referred to [place] by Dr. Erick Colace.  He is an individual with bilateral lumbar pain, left greater  than right, with a known facet syndrome and anterior compartment disease.  He is deaf, but has had a significant description of  the procedure, with  his wife acting as an interpreter, at Dr. Wynn Banker' elaboration.  Questions  were answered.  It was felt that there were no barriers to communication.  My goal today is to proceed with radiofrequency neural ablation, and he is  prepared for the procedure.  The risks, complications and options are  elaborated.  He is an individual who has had progressive paralumbar  discomfort with declining functional indices and quality of life indices,  demonstrating positive provocative block, bilateral, to the facet medial  branch.  The facet medial branch is blocked at L5-S1, L4-5, L3-4, with  contributory innervation questionably addressed.  This did relieve most of  his pain.  We will add L2-3 as a contributory component, as most of his  disease seems to be between the L3 and L5 level.  He relates no advancing  neurological features.   PHYSICAL EXAMINATION:  Objectively, concur with the previous examinations.  The pain is primarily paralumbar and myofascial.   IMPRESSION:  1. Degenerative spine disease of the lumbar spine.  2. Facet syndrome.   PLAN:  Facet neurotomy, most problematic side, left side at L5-S1, L4-5, L3-  4 and L2-3, contributory enervation addressed, independent needle access  points, under local anesthetic.   Predicate further contralateral injection  based on need.  He has consented.   DESCRIPTION OF PROCEDURE:  The patient is taken to the fluoroscopy suite and  placed in the prone position.  The back is prepped and draped in the usual  fashion using a #22 gauge 10 mm RF needle.  I advanced to L5-S1, L4-5, L3-4,  L2-3 medial branch and confirmed placement in multiple fluoroscopic  positions.  I used Isovue 200 to appropriate stimulate motor and sensory.  We then reconfirmed needle placement.  This was at multiple points during  the procedure.  I used Isovue 200.  I used 1 mL of lidocaine, 1% MPF was  introduced at each level, with a total of 40 mg of Aristocort in divided  dose.  The lesion is performed at 70 degrees for 70 seconds at each level,  and he tolerated this well.  Appropriate recovery, and no complications identified.   FOLLOWUP:  To follow up in one to two months for contralateral side if  warranted.  Discharged home.                                               Celene Kras, MD   HH/MEDQ  D:  01/05/2004 11:37:05  T:  01/05/2004 13:14:17  Job:  [id number]

## 2011-01-27 NOTE — Procedures (Signed)
NAME:  Christian [name], Christian [name]                        ACCOUNT NO.:  [id number]   MEDICAL RECORD NO.:  [id number]                   PATIENT TYPE:  REC   LOCATION:  TPC                                  FACILITY:  [facility]   PHYSICIAN:  Erick Colace, M.D.           DATE OF BIRTH:  1941/02/06   DATE OF PROCEDURE:  DATE OF DISCHARGE:                                 OPERATIVE REPORT   PROCEDURE:  Lumbar medial branch block, bilateral L3-4 and lumbar dorsal  ramus block, bilateral L5.   INDICATIONS:  Low back pain, worse with prolonged standing.  Unresponsive to  medications utilized.   Informed consent was obtained, and his wife, who is hearing impaired, as he  is but better at communicating, was able to reinforce instructions.  Patient  gave written informed consent.   Patient is not on any blood thinners and has no allergies to lidocaine or IV  contrast dye.   Patient was placed in a prone position on the fluoroscopy table.  Betadine  drape and sterile drape.  An AP view was utilized with a slight cephalad  tilt at 15 degrees to visualize the junction of the sacral ala with S1  periarticular process.  A 3-1/2 inch 22 gauge spinal needle was manipulated  under fluoroscopic guidance after skin and subcu tissues were infiltrated  with 2 cc of 1% lidocaine using a 25 gauge 1-1/4 needle.  The spinal needle  was then brought to the junction of the left S1 periarticular process at the  sacral ala.  Contrast injection confirmed no intravascular uptake.  Bony  contact was made, and 0.3 cc of 2% methylparaben and free lidocaine were  injected.  The same procedure was repeated exactly on the right side at the  same location.  Then an oblique image Scotty dog view of the right L5 SAP  transverse process junction was obtained.  Once again, skin and subcu  tissues anesthetized with lidocaine using the 25 gauge needle.  Then a 22  gauge 5 inch spinal needle was manipulated down to the eye of the  Scotty  dog.  Then 0.3 cc 2% methylparaben and free lidocaine were injected.  The  same procedure was repeated on the right side at L4 SAP transverse process  junction; however, at this site, intravascular uptake was noted, and the  needle repositioned so that no intravascular uptake was noted.  Negative  aspiration of blood was obtained.  The same 0.3 cc of 2% lidocaine was  injected.  Then the C arm was oblique towards the left side, and the left L4  SAP junction was identified.  A 5 inch 22 gauge spinal needle was  manipulated to that area under fluoroscopic guidance.  Bone contact was  made, and 3 cc of Omnipaque 180 was injected, demonstrating no intravascular  uptake and 0.3 cc of 2% methylparaben and free lidocaine were injected at  that site.  Last, the  same procedure was performed at left L5 SAP transverse  process junction.  Once again, Omnipaque 180 demonstrated no intravascular  uptake of the 0.3 cc injected and then 0.3 cc of 2% lidocaine was injected  after a negative drawback of blood.  Patient tolerated the procedure well.   Patient will follow up with Dr. Thersa Salt if he has positive results, that is,  greater than 50% improvement in his pain, then will set him up for lumbar  radiofrequency neurotomy with Dr. Suezanne Jacquet.                                                Erick Colace, M.D.   AEK/MEDQ  D:  12/03/2003 17:47:17  T:  12/04/2003 08:45:56  Job:  [id number]

## 2011-01-27 NOTE — Op Note (Signed)
NAME:  Christian [name], Christian [name]                        ACCOUNT NO.:  [id number]   MEDICAL RECORD NO.:  [id number]                   PATIENT TYPE:  INP   LOCATION:  5037                                 FACILITY:  [facility]   PHYSICIAN:  Christian [name] DICTATOR                    DATE OF BIRTH:  11-14-1940   DATE OF PROCEDURE:  06/20/2002  DATE OF DISCHARGE:                                 OPERATIVE REPORT   PREOPERATIVE DIAGNOSES:  Osteoarthritis of the left hip.   POSTOPERATIVE DIAGNOSES:  Osteoarthritis of the left hip.   PROCEDURE:  Left total hip arthroplasty with Osteonics components, 36 mm  ceramic head, 60 mm acetabular, 10-degree polyethylene lining, #9 femur with  a 15 mm distal tip and a +0 neck.   SURGEON:  Christian [name], M.D.   ANESTHESIA:  General.   ESTIMATED BLOOD LOSS:  300 cc.   ANTIBIOTICS:  1 gm of Kefzol.   DRAINS:  None.   COMPLICATIONS:  The patient had less than a 1 cm crack in the femoral neck  after impaction.  After the crack, the crack was stable and did not  propagate, and with rotation or torsion, the femoral component was stable  with no rotational instability, and this was left without any surgical  treatment.   DISPOSITION:  To PACU in stable condition.   INDICATIONS FOR PROCEDURE:  The patient is a 70 year old gentleman with  chronic osteoarthritis, who is status post bilateral total knee and a right  total hip and presents at this time for a left total hip arthroplasty.  The  risks and benefits were discussed.  The patient states that he understands  and wishes to proceed at this time.   DESCRIPTION OF PROCEDURE:  The patient was brought to OR room 5 and  underwent a general anesthetic.  After an adequate level of anesthesia was  obtained, the patient was placed in the right lateral decubitus position  with the left side up, and his left lower extremity was prepped using  DuraPrep and draped in a sterile field with Ioban covering all exposed skin.  A  posterolateral incision was made.  This was carried down through the  tensor fasciae latae, and a Charnley retractor was placed.  The piriformis  was tagged and retracted.  The short external rotators and the capsule were  also tagged, teed, and retracted.  The head was delivered.  The femoral neck  cut was made 1 cm proximal to the calcar.  Attention was then focused on the  acetabulum. The acetabulum rim was cleansed of labrum.  This was  sequentially reamed up to a 60 mm with good bleeding bone.  The trial and  then final acetabular component were inserted, and the trial 10-degree poly  liner was inserted.  Attention was then focused on the femur.  The femur was  sequentially reamed for a 9 stem.  This was broached up a 9 stem, and the 9  trial was placed with a 46 mm head, and the patient had a good range of  motion. He had flexion to 110 degrees, internal rotation of 60 degrees, with  full extension and external rotation with no impingement and no instability.  The trial components were removed.  The femur was then reamed up to a 15.5  for a 15 mm distal tip.  The centralizer plug was placed, and the 10-degree  pie liner was placed with the largest superoposteriorly.  The femoral  component was then impacted.  After impaction, a small 1 mm in diameter  crack had developed less than a cm in length.  After the crack developed,  further impaction was performed, and there was no movement of the femoral  component, and with rotational twisting, there was no rotational  instability. This was felt to be stable.  Again, the wound was irrigated.  A  36 mm ceramic head was inserted.  This was reduced and again placed through  a full range of motion.  He has full flexion, internal rotation, extension,  and external rotation with no instability.  Again, the wound was irrigated.  The capsule and the short external rotators were reapproximated using the #2  Ethibond.  The tensor fasciae latae was  closed using a running #1 Vicryl.  The deep fascia and subcu fascia were closed using 0 and 2-0 Vicryl,  respectively.  The skin was closed using proximate staples.  The wound was  covered with Adaptic orthopedic sponges, ABD dressing, and Hypofix tape.  A  knee immobilizer was applied.  The patient was extubated and taken to the  PACU in stable condition.                                               Christian [name] DICTATOR    DD/MEDQ  D:  06/20/2002  T:  06/20/2002  Job:  [id number]

## 2011-01-27 NOTE — Consult Note (Signed)
[facility]  Patient:    Christian [name], Christian [name] Visit Number: [id number] MRN: [id number]          Service Type: PMG Location: TPC Attending Physician:  Sondra Come Dictated by:   Sondra Come, D.O. Proc. Date: 08/22/01 Admit Date:  08/16/2001   CC:         Stefani Dama, M.D.   Consultation Report  [name] returns to clinic today as scheduled for a lumbar epidural steroid injection.  Patient was seen initially on August 16, 2001.  He has had no significant changes in the interim.  He complains of severe pain in his low back rating it a 10/10 on a subjective scale.  His function and quality of life indexes remain declined.  His sleep remains poor.  He denies any new neurologic complaints.  Denies bowel and bladder dysfunction.  Continues to take hydrocodone and OxyContin just as needed.  I review health and history form and 14 point review of systems.  PHYSICAL EXAMINATION  GENERAL:  Healthy male in no acute distress.  VITAL SIGNS:  Blood pressure 152/84, pulse 84, respirations 16, O2 saturation 97%.  NEUROLOGIC:  No new neurologic findings in the lower extremity including motor, sensory, reflexes.  IMPRESSION: 1. 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 2. Degenerative disk disease of the lumbar spine, severe, without myelopathy. 3. 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 4. 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 5. 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  PLAN: 1. Lumbar epidural steroid injection.  Procedure was described to patient and    informed consent was obtained.  Patient was brought back to the fluoroscopy    suite and placed on the table in prone position.  Skin was prepped and    draped in the usual sterile fashion.  Skin and subcutaneous tissue was    anesthetized with 3 cc of preservative free 1% lidocaine.  Under direct    fluoroscopic guidance an 18 gauge 3.5 inch Tuohy  needle was advanced into    the left paramedian L5-S1 epidural space with loss of resistance technique.    No CSF, heme, or paraesthesias were noted.  This was injected with 1 cc of    preservative free Depo-Medrol 40 mg/cc plus 2 cc of preservative free    normal saline with needle flush.  Patient tolerated the procedure well.  No    complications.  Discharge instructions given. 2. Patient to continue current medications for now.  Would consider    discontinuing OxyContin since the patient is only taking it as needed. 3. Patient to return to clinic in two weeks for reevaluation and possible    second lumbar epidural steroid injection. 4. Await physical therapy.  Patient was educated on the above findings and recommendations and understands.  Communication was with the assistance of his wife for sign language as well as lip reading.  The patient has no questions. Dictated by:   Sondra Come, D.O. Attending Physician:  Sondra Come DD:  08/22/01 TD:  08/22/01 Job: [id number]

## 2011-01-27 NOTE — H&P (Signed)
[place]  Patient:    Christian [name], Christian [name]                     MRN: [id number] Adm. Date:  [id number] Attending:  Nadara Mustard                         History and Physical  HISTORY OF PRESENT ILLNESS:  Patient is a 70 year old gentleman who is status post bilateral total knees with diabetic insensate neuropathy with severe spinal pathology as well who has severe osteoarthritis of his right hip. Patient is unable to ambulate without crutches.  He has been unable to work due to hip pain.  He has other complicating medical diseases which include Charcot-Marie-Tooth disease.  Patient has failed conservative care and presents at this time for surgical intervention.  The risks and benefits were discussed including infection, neurovascular injury, persistent pain, failure of the implant, fracture of the bone, injury to the sciatic nerve, DVT, pulmonary embolus and death as well as dislocation.  Patient states he understands and wishes to proceed at this time.  PAST MEDICAL HISTORY:  Significant for type 2 diabetes, Charcot-Marie-Tooth and hypertension.  ALLERGIES:  No known drug allergies.  PAST MEDICAL HISTORY:  Dr. Lilly Cove.  MEDICATIONS:  Glucophage, glyburide, Norvasc, cyclobenzaprine, lorazepam, Vioxx and Voltaren.  REVIEW OF SYSTEMS:  Negative for substernal chest pain.  FAMILY HISTORY:  Positive for Charcot-Marie-Tooth, cancer, heart disease and hypertension.  REVIEW OF SYSTEMS:  Positive for hypertension, diabetes, bronchitis and Charcot-Marie-Tooth.  MRI scan shows degenerative disk disease and spinal stenosis at L5-S1, L3-4 and T12 to L1.  Radiographs show severe osteoarthritis with bone-on-bone contact of the right hip.  PHYSICAL EXAMINATION:  VITAL SIGNS:  Temperature 97.6, pulse 100, respiratory rate 20, blood pressure 138/82.  GENERAL:  He is in no acute distress.  NECK:  Supple.  No bruits.  LUNGS:  Clear to  auscultation.  CARDIOVASCULAR:  Regular rate and rhythm.  EXTREMITIES:  Hips:  He has 10 degrees of external rotation on the right, internal rotation of 0 degrees.  He has full extension and flexion to 90 degrees.  ASSESSMENT:  Severe osteoarthritis, right hip.  PLAN:  The patient presents at this time for a right total hip arthroplasty. He currently works as a Counsellor and we are going to get him set up for disability paperwork.  With his multiple medical problems and severe arthritic joint problems, patient will be permanently disabled. DD:  02/06/01 TD:  02/06/01 Job: [id number]

## 2011-01-27 NOTE — Consult Note (Signed)
NAME:  Christian [name], Christian [name]                        ACCOUNT NO.:  [id number]   MEDICAL RECORD NO.:  [id number]                   PATIENT TYPE:  REC   LOCATION:  TPC                                  FACILITY:  [facility]   PHYSICIAN:  Sondra Come, D.O.                 DATE OF BIRTH:  07-24-1941   DATE OF CONSULTATION:  05/05/2002  DATE OF DISCHARGE:                  PHYSICAL MEDICINE & REHABILITATION CONSULTATION   The patient returns to clinic today as scheduled for reevaluation.  He was  last seen on March 03, 2002.  He has been doing very well in the interim.  He  continues on Ultram 50 mg two to three times per day.  He was taking  Ultracet prior to this and felt that the Ultracet helped him a little bit  more for his pain.  Unfortunately, his wife states they are unable to afford  the Ultracet.  She requests samples today and we discussed possibly getting  them on a patient assistance program.  His pain is primarily in his low  back.  He denies any radicular component at this time.  He has undergone  four lumbar epidural steroid injections since December 2002 with significant  improvement in neurogenic claudication.  Currently, his pain is between 2-  3/10 on a subjective scale.  He typically needs medications for pain when he  is active and working.  I review health and history form and 14 point review  of systems.  Function and quality of life indexes have improved.  Sleep is  great.  He denies any tripping, stumbling, or worsening of his weakness due  to the Charcot-Marie-Tooth disease.   PHYSICAL EXAMINATION:  GENERAL:  Healthy male in no acute distress.  VITAL SIGNS:  Blood pressure 149/82, pulse 82, respirations 18, O2  saturation 97% on room air.  NEUROLOGIC:  Gait is stable with single point cane.  He does have some foot  drop without dragging his toes.  He has a steppage type gait pattern  compensating for his ankle dorsiflexor weakness.  No new neurologic changes  in the lower  extremities including motor, sensory, and reflexes.   IMPRESSION:  1. Spinal stenosis of the lumbar spine with improved neurogenic     claudication.  2. Degenerative disk disease of the lumbar spine, severe, without     myelopathy.  3. Severe facet arthropathy of the lumbar spine with bilateral L5 pars     defects.  4. Charcot-Marie-Tooth disease with bilateral foot drop, stable.   PLAN:  1. Continue conservative measures for now including Ultracet one to two up     to q.i.d. or Ultram one to two up to q.i.d. for pain.  Number 30 Ultracet     samples given today.  Will check and see if patient qualifies for patient     assistance program.  If he does not we will continue with the Ultram     generic.  2. Consider  lumbar facet blocks if patient's symptoms seem to worsen.  Also,     would consider repeating lumbar epidural steroid injection at some point     if symptoms of neurogenic claudication return.  3. The patient is to return to clinic in six months for reevaluation or     sooner as needed.   The patient was educated on the above findings and recommendations and  understands.  No barriers on communication.                                              Sondra Come, D.O.   JJW/MEDQ  D:  05/05/2002  T:  05/05/2002  Job:  [id number]   cc:   Stefani Dama, M.D.

## 2011-01-27 NOTE — Consult Note (Signed)
[facility]  Patient:    RECARDO, LINN Visit Number: [id number] MRN: [id number]          Service Type: PMG Location: TPC Attending Physician:  Sondra Come Dictated by:   Sondra Come, D.O. Proc. Date: 03/03/02 Admit Date:  01/20/2002   CC:         Stefani Dama, M.D.   Consultation Report  Mr. Gary returns to clinic today as scheduled for reevaluation.  He underwent a lumbar epidural steroid injection on February 17, 2002 for spinal stenosis of the lumbar spine with neurogenic claudication.  The patient currently has essentially complete relief of his low back and lower extremity pain.  He rates his pain as a 0/10 on a subjective scale today.  His function and quality of life indexes have improved significantly.  His sleep is great. He continues taking hydrocodone 7.5/325 mg just as needed sparingly.  He did not bring his pill bottles with him today, but states he has pills remaining. I review health and history form and 14 point review of systems.  PHYSICAL EXAMINATION  GENERAL:  Healthy male in no acute distress.  VITAL SIGNS:  Blood pressure 162/82, pulse 78, respirations 12, O2 saturation 97% on room air.  NEUROLOGIC:  Gait is stable with single point cane.  The patient compensates for his distal lower extremity weakness from Charcot-Marie-Tooth disease with a steppage type gait.  No new neurologic findings in the lower extremities.  IMPRESSION: 1. Spinal stenosis of the lumbar spine with improved neurogenic claudication. 2. Degenerative disk disease of the lumbar spine, severe, without myelopathy. 3. Severe facet arthropathy of the lumbar spine with bilateral L5 pars    defects. 4. Charcot-Marie-Tooth disease with bilateral foot drop, stable.  PLAN: 1. Given patients significant improvement and essentially resolved symptoms,    will forego repeat lumbar epidural steroid injection at this time.  We do    have this as an option  if needed at a further date.  Again, I am cautious    in that patient has received four epidural steroid injections over the past    seven months.  Again, we discussed potential for steroid induced    osteoporosis. 2. Will give patient a trial of Ultracet one to two up to q.i.d. as needed for    pain.  I provide him with 10 sample pills.  If this is helpful, we will    call in a prescription for him.  If this is not helpful, he can resume    taking the hydrocodone 7.5 mg as needed. 3. The patient is to follow up in our clinic in two months for reevaluation,    sooner as needed.  The patient was educated on the above findings and recommendations and understands.  No barriers to communication. Dictated by:   Sondra Come, D.O. Attending Physician:  Sondra Come DD:  03/03/02 TD:  03/04/02 Job: [id number]

## 2011-01-27 NOTE — Op Note (Signed)
[place]. [facility]  Patient:    Christian [name], Christian [name] Visit Number: [id number] MRN: [id number]          Service Type: DSU Location: [facility] Attending Physician:  Nadara Mustard Proc. Date: 05/09/01 Admit Date:  05/09/2001                             Operative Report  PREOPERATIVE DIAGNOSIS: 1. 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 2. Heel cord contracture, left lower extremity.  POSTOPERATIVE DIAGNOSIS: 1. 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 2. Heel cord contracture, left lower extremity.  OPERATION PERFORMED: 1. Shortening osteotomy with open reduction internal fixation, left first and    fourth metatarsals. 2. Heel cord lengthening, left Achilles tendon.  SURGEON:  Nadara Mustard, M.D.  ANESTHESIA:  Ankle block.  ESTIMATED BLOOD LOSS:  Minimal.  ANTIBIOTICS:  1 gm of Kefzol.  DRAINS:  None.  COMPLICATIONS:  None.  TOURNIQUET TIME:  53 minutes at 250 mmHg.  DISPOSITION:  To PACU in stable condition.  INDICATIONS FOR PROCEDURE:  The patient is a 70 year old gentleman with diabetic insensate neuropathy who has had multiple surgical procedures on his feet, who has residual prominence underneath the first and fourth metatarsal heads, who has been treated conservatively with pressure unloading, wound care, antibiotics and has been treated for over six months to resolve the ulcers.  Once the ulcers have healed, the patient presents at this time for shortening osteotomies to prevent recurrent ulceration.  The risks and benefits were discussed, including infection, neurovascular injury, recurrence of the ulcers, fracture of the bone, nonhealing of the wound, need for additional surgery.  The patient states that he understands and wishes to proceed at this time.  DESCRIPTION OF PROCEDURE:  The patient was brought to operating room 5 after undergoing an  ankle block.  After adequate level of anesthesia obtained, patients left lower extremity was prepped first with Hibiclens scrub.  This was then dried and he was then prepped using DuraPrep and draped into sterile field.  A dorsal incision was made over the first metatarsal and this was made over a previous surgical incision.  He had other previous surgical incisions where approximately 1 cm away but these appeared to have all healed well. He had good pulses and it was felt that going through an old surgical incision did have a good chance of healing.  The incision was carried down sharply to bone.   Subperiosteal dissection was carried out.  An oblique 0.5 cm wedge was taken out of the first metatarsal.  This wedge was oblique so that there could be further shortening.  The metatarsal was then shortened so it felt level with the plantar aspect of the second and third metatarsal heads.  This then underwent lag screw fixation with a 40 mm 3.5 cortical screw.  AP and lateral radiographs verified reduction.  The patient then underwent this wound was then irrigated and closed using 2-0 nylon.  Attention was then focused over the fourth metatarsal.  An incision was made dorsally over the fourth metatarsal.  This was also made over a previous surgical incision.  There was another surgical incision that was close to this incision but it was felt that this did have a good chance to heal.  This was carried sharply down to bone. Subperiosteal dissection was carried out.  Again  a 0.5 cm oblique wedge was taken out of the bone and this was allowed to shorten to the level of the third metatarsal.  He has absence of the fifth metatarsal and it is felt that this would not do well with internal fixation and needed mobility.  He had ankylosis of the other joints and this would essentially act as a joint to minimize ulceration laterally.  The wound was irrigated and the skin was closed with 2-0 vertical  mattress.  The wounds were covered with Adaptic dressing.  Attention was then focused to the heel cord.  A z-lengthening of the heel cord was then performed using a percutaneous technique with a 64 Beaver blade.  Medial, distal, lateral and then proximal medial incisions were made at the Achilles.  The patient preoperatively had dorsiflexion to neutral, postoperatively had dorsiflexion to about 25 degrees.  These wounds were then covered with Adaptic as well.  The wounds were covered with 4 x 4s, sterile Webril, ABD dressing and a Coban dressing was applied.  The tourniquet was deflated after 53 minutes.  The patient was then taken to PACU in stable condition.  Plan for touch-down weightbearing with rest, ice and elevation. He received 1 gm of Kefzol intraoperatively. Attending Physician:  Nadara Mustard DD:  05/09/01 TD:  05/09/01 Job: [phone] [id number]

## 2013-05-15 ENCOUNTER — Other Ambulatory Visit: Payer: Self-pay | Admitting: Internal Medicine

## 2013-05-15 DIAGNOSIS — R109 Unspecified abdominal pain: Secondary | ICD-10-CM

## 2013-05-16 ENCOUNTER — Ambulatory Visit
Admission: RE | Admit: 2013-05-16 | Discharge: 2013-05-16 | Disposition: A | Discharge status: Home / Self Care | Payer: Medicare Other | Source: Ambulatory Visit | Attending: Internal Medicine | Admitting: Internal Medicine

## 2013-05-16 DIAGNOSIS — R109 Unspecified abdominal pain: Secondary | ICD-10-CM

## 2013-08-13 ENCOUNTER — Encounter: Payer: Self-pay | Admitting: Cardiology

## 2013-08-13 ENCOUNTER — Encounter: Payer: Self-pay | Admitting: Interventional Cardiology

## 2013-08-13 ENCOUNTER — Ambulatory Visit (INDEPENDENT_AMBULATORY_CARE_PROVIDER_SITE_OTHER): Payer: Medicare Other | Admitting: Interventional Cardiology

## 2013-08-13 VITALS — BP 140/78 | HR 80 | Ht 69.0 in | Wt 245.0 lb

## 2013-08-13 DIAGNOSIS — E782 Mixed hyperlipidemia: Secondary | ICD-10-CM

## 2013-08-13 DIAGNOSIS — I209 Angina pectoris, unspecified: Secondary | ICD-10-CM

## 2013-08-13 DIAGNOSIS — I10 Essential (primary) hypertension: Secondary | ICD-10-CM

## 2013-08-13 DIAGNOSIS — R079 Chest pain, unspecified: Secondary | ICD-10-CM

## 2013-08-13 LAB — CBC WITH DIFFERENTIAL/PLATELET
Basophils Absolute: 0 10*3/uL (ref 0.0–0.1)
Basophils Relative: 0 % (ref 0–1)
Eosinophils Absolute: 0.2 10*3/uL (ref 0.0–0.7)
Eosinophils Relative: 3 % (ref 0–5)
HCT: 31 % — ABNORMAL LOW (ref 39.0–52.0)
Hemoglobin: 9.8 g/dL — ABNORMAL LOW (ref 13.0–17.0)
Lymphs Abs: 2.7 10*3/uL (ref 0.7–4.0)
MCH: 27.5 pg (ref 26.0–34.0)
MCHC: 31.6 g/dL (ref 30.0–36.0)
MCV: 87.1 fL (ref 78.0–100.0)
Monocytes Absolute: 0.7 10*3/uL (ref 0.1–1.0)
Monocytes Relative: 8 % (ref 3–12)
Neutro Abs: 4.9 10*3/uL (ref 1.7–7.7)
RBC: 3.56 MIL/uL — ABNORMAL LOW (ref 4.22–5.81)
RDW: 14.5 % (ref 11.5–15.5)

## 2013-08-13 MED ORDER — ISOSORBIDE MONONITRATE ER 30 MG PO TB24: 30.0000 mg | ORAL_TABLET | Freq: Every day | ORAL | Status: DC

## 2013-08-13 NOTE — Progress Notes (Signed)
Patient ID: Christian [name], male   DOB: [birth year], 72 y.o.   MRN: [id number]     Patient ID: Christian [name] MRN: [id number] DOB/AGE: 08/17/1941 72 y.o.   Referring Physician  Dr.  Donette Larry   Reason for Consultation  Exertional chest pain  HPI: 72 y/o who has had some exertional chest pain with walking over the past 2 months.  It happens after 5 minutes of walking.  It is in the center of his cehst.  No associated sweating or nausea.  A few years ago, he had a negative LE arterial Doppler.  No chest pain at rest.  He had a negative cardiolite many years ago.  Short walks do not bother him if he goes slow.  Going from the parking lot to the ofice has not caused problems today, but my if he tried to walk faster.  Long walks caused more trouble.  If he stays still, he can avoid the chest discomfort. He has used sublingual nitroglycerin several times. It has always taken care of his chest discomfort. He knows that if he has discomfort that does not go away with 3 sublingual nitroglycerin, he needs to go to the hospital.   Current Outpatient Prescriptions  Medication Sig Dispense Refill  . amLODipine-benazepril (LOTREL) 5-20 MG per capsule Take 1 capsule by mouth daily.      Marland Kitchen aspirin 81 MG tablet Take 81 mg by mouth daily.      . cyclobenzaprine (FLEXERIL) 10 MG tablet Take 10 mg by mouth 3 (three) times daily.      . diclofenac (VOLTAREN) 75 MG EC tablet Take 75 mg by mouth 2 (two) times daily.      . fexofenadine (ALLEGRA) 180 MG tablet Take 180 mg by mouth daily.      . insulin glargine (LANTUS) 100 UNIT/ML injection Inject 100 Units into the skin at bedtime.      Marland Kitchen LORazepam (ATIVAN) 1 MG tablet Take 1 mg by mouth every 8 (eight) hours as needed for anxiety.      . metFORMIN (GLUCOPHAGE) 1000 MG tablet Take 1,000 mg by mouth 2 (two) times daily with a meal.      . NITROSTAT 0.4 MG SL tablet       . oxybutynin (DITROPAN-XL) 10 MG 24 hr tablet Take 10 mg by mouth 2 (two) times daily.        Marland Kitchen oxyCODONE-acetaminophen (ROXICET) 5-325 MG/5ML solution Take by mouth every 8 (eight) hours as needed for severe pain.      . pioglitazone (ACTOS) 30 MG tablet Take 30 mg by mouth daily.      . polyethylene glycol (MIRALAX / GLYCOLAX) packet Take 17 g by mouth daily.      . rosuvastatin (CRESTOR) 10 MG tablet Take 10 mg by mouth daily.       No current facility-administered medications for this visit.   No past medical history on file.  No family history on file.  History   Social History  . Marital Status: Married    Spouse Name: N/A    Number of Children: N/A  . Years of Education: N/A   Occupational History  . Not on file.   Social History Main Topics  . Smoking status: Not on file  . Smokeless tobacco: Not on file  . Alcohol Use: Not on file  . Drug Use: Not on file  . Sexual Activity: Not on file   Other Topics Concern  . Not on file  Social History Narrative  . No narrative on file    No past surgical history on file.    (Not in a hospital admission)  Review of systems complete and found to be negative unless listed above .  No nausea, vomiting.  No fever chills, No focal weakness,  No palpitations.  Physical Exam: Filed Vitals:   08/13/13 1155  BP: 140/78  Pulse: 80    Weight: 245 lb (111.131 kg)  Physical exam:  [place]/AT, uses sign language EOMI No JVD, No carotid bruit RRR S1S2  No wheezing Soft. NT, nondistended Bilateral lower extremity edema.  Brace on left leg No focal motor or sensory deficits Normal affect  Labs:   Lab Results  Component Value Date   WBC 12.8* 12/03/2009   HGB 13.1 12/03/2009   HCT 39.1 12/03/2009   MCV 87.8 12/03/2009   PLT 288 12/03/2009   No results found for this basename: NA, K, CL, CO2, BUN, CREATININE, CALCIUM, LABALBU, PROT, BILITOT, ALKPHOS, ALT, AST, GLUCOSE,  in the last 168 hours No results found for this basename: CKTOTAL, CKMB, CKMBINDEX, TROPONINI    No results found for this basename: CHOL   No results  found for this basename: HDL   No results found for this basename: LDLCALC   No results found for this basename: TRIG   No results found for this basename: CHOLHDL   No results found for this basename: LDLDIRECT       EKG: Normal sinus rhythm, nonspecific diffuse ST segment depressions  ASSESSMENT AND PLAN: Recent onset angina, class III 1) angina: We discussed stress test versus catheterization to evaluate. He had a negative stress test several years ago. He has multiple risk factors for coronary artery disease. I would not believe a negative stress test result. The risks and benefits of cardiac catheterization were explained to the patient and he is willing to proceed. We'll start him door 30 mg daily to try and decrease his symptoms. Could also add beta blocker if him door is unsuccessful at treating angina. Will give him a week to try medical therapy before the catheterization.  2) hypertension: Borderline control today. Continue current antihypertensive medicines. He is also on amlodipine which should help reduce angina.  3) hyperlipidemia: Continue lipid-lowering therapy. LDL target is less than 100. Based on the results of his catheterization, the target LDL may decrease. Signed:   Fredric Mare, MD, [facility] 08/13/2013, 12:17 PM

## 2013-08-13 NOTE — Patient Instructions (Addendum)
Your physician has requested that you have a cardiac catheterization. Cardiac catheterization is used to diagnose and/or treat various heart conditions. Doctors may recommend this procedure for a number of different reasons. The most common reason is to evaluate chest pain. Chest pain can be a symptom of coronary artery disease (CAD), and cardiac catheterization can show whether plaque is narrowing or blocking your heart's arteries. This procedure is also used to evaluate the valves, as well as measure the blood flow and oxygen levels in different parts of your heart. For further information please visit [email or web address] Please follow instruction sheet, as given.  Your physician has recommended you make the following change in your medication:   1. Start Imdur 30 mg 1 tablet by mouth daily.  Your physician recommends that you return for lab work today at [facility].

## 2013-08-14 ENCOUNTER — Encounter (HOSPITAL_COMMUNITY): Payer: Self-pay | Admitting: Pharmacy Technician

## 2013-08-14 LAB — BASIC METABOLIC PANEL
CO2: 25 mEq/L (ref 19–32)
Calcium: 9.3 mg/dL (ref 8.4–10.5)
Chloride: 106 mEq/L (ref 96–112)
Sodium: 140 mEq/L (ref 135–145)

## 2013-08-14 LAB — PROTIME-INR
INR: 1.04 (ref <1.50)
Prothrombin Time: 13.6 seconds (ref 11.6–15.2)

## 2013-08-18 ENCOUNTER — Telehealth: Payer: Self-pay | Admitting: Interventional Cardiology

## 2013-08-18 DIAGNOSIS — D649 Anemia, unspecified: Secondary | ICD-10-CM

## 2013-08-18 NOTE — Telephone Encounter (Signed)
New message    Pt having cath this week and was told that his iron was low.  He has been eating chicken livers.  Will he need to have another blood test prior to cath to see if iron is better?

## 2013-08-18 NOTE — Telephone Encounter (Signed)
lmtrc to see how pt is feeling per Dr. Eldridge Dace.

## 2013-08-18 NOTE — Telephone Encounter (Signed)
Follow up    Pt retuned your call please call him back.

## 2013-08-18 NOTE — Telephone Encounter (Deleted)
Pt denies CP and SOB

## 2013-08-18 NOTE — Telephone Encounter (Signed)
Pt has some tightness in his chest if he looses his balance or becomes startled, but otherise no pain or SOB.

## 2013-08-18 NOTE — Telephone Encounter (Addendum)
Returned pts call. Appt made for pt to see Bedelia Person NP at [facility] [place] at 10 am tomorrow morning. Darel Hong will call me with hemocult results.

## 2013-08-19 ENCOUNTER — Encounter: Payer: Self-pay | Admitting: Interventional Cardiology

## 2013-08-19 NOTE — Telephone Encounter (Signed)
A stent require use of blood thinners. Given that his blood count is dropping, it would not be safe to start him on a blood thinner at this time. We need to workup why his hemoglobin is dropping before doing the cardiac catheterization.

## 2013-08-19 NOTE — Telephone Encounter (Signed)
Noted, pt is being referred to GI. Awaiting appt.

## 2013-08-20 ENCOUNTER — Encounter: Payer: Self-pay | Admitting: Internal Medicine

## 2013-08-21 ENCOUNTER — Encounter: Payer: Self-pay | Admitting: Internal Medicine

## 2013-08-21 ENCOUNTER — Encounter (HOSPITAL_COMMUNITY): Admission: RE | Payer: Self-pay | Source: Ambulatory Visit

## 2013-08-21 ENCOUNTER — Ambulatory Visit (HOSPITAL_COMMUNITY)
Admission: RE | Admit: 2013-08-21 | Payer: Medicare Other | Source: Ambulatory Visit | Admitting: Interventional Cardiology

## 2013-08-21 SURGERY — LEFT HEART CATHETERIZATION WITH CORONARY ANGIOGRAM
Anesthesia: LOCAL

## 2013-08-21 NOTE — Telephone Encounter (Signed)
Pt has appt with Gi for 08/22/13.

## 2013-08-21 NOTE — Telephone Encounter (Signed)
Fyi to Dr. Varanasi.  

## 2013-08-22 ENCOUNTER — Encounter: Payer: Self-pay | Admitting: Internal Medicine

## 2013-08-22 ENCOUNTER — Other Ambulatory Visit (INDEPENDENT_AMBULATORY_CARE_PROVIDER_SITE_OTHER): Payer: Medicare Other

## 2013-08-22 ENCOUNTER — Ambulatory Visit (INDEPENDENT_AMBULATORY_CARE_PROVIDER_SITE_OTHER): Payer: Medicare Other | Admitting: Internal Medicine

## 2013-08-22 VITALS — BP 128/50 | HR 80 | Ht 68.0 in | Wt 250.4 lb

## 2013-08-22 DIAGNOSIS — D649 Anemia, unspecified: Secondary | ICD-10-CM

## 2013-08-22 DIAGNOSIS — I209 Angina pectoris, unspecified: Secondary | ICD-10-CM

## 2013-08-22 DIAGNOSIS — D509 Iron deficiency anemia, unspecified: Secondary | ICD-10-CM

## 2013-08-22 LAB — CBC
MCHC: 32.5 g/dL (ref 30.0–36.0)
Platelets: 283 10*3/uL (ref 150.0–400.0)
RDW: 15.9 % — ABNORMAL HIGH (ref 11.5–14.6)

## 2013-08-22 LAB — IBC PANEL
Saturation Ratios: 15.5 % — ABNORMAL LOW (ref 20.0–50.0)
Transferrin: 359.4 mg/dL (ref 212.0–360.0)

## 2013-08-22 MED ORDER — MOVIPREP 100 G PO SOLR: ORAL | Status: DC

## 2013-08-22 NOTE — Patient Instructions (Signed)
You have been scheduled for an endoscopy/Colonoscopy with propofol. Please follow written instructions given to you at your visit today. If you use inhalers (even only as needed), please bring them with you on the day of your procedure. Your physician has requested that you go to [email or web address] and enter the access code given to you at your visit today. This web site gives a general overview about your procedure. However, you should still follow specific instructions given to you by our office regarding your preparation for the procedure.   Your physician has requested that you go to the basement for  lab work before leaving today.  You have been given FOBT cards pleae follow the instructions and send them back to our lab                                               We are excited to introduce MyChart, a new best-in-class service that provides you online access to important information in your electronic medical record. We want to make it easier for you to view your health information - all in one secure location - when and where you need it. We expect MyChart will enhance the quality of care and service we provide.  When you register for MyChart, you can:    View your test results.    Request appointments and receive appointment reminders via email.    Request medication renewals.    View your medical history, allergies, medications and immunizations.    Communicate with your physician's office through a password-protected site.    Conveniently print information such as your medication lists.  To find out if MyChart is right for you, please talk to a member of our clinical staff today. We will gladly answer your questions about this free health and wellness tool.  If you are age 70 or older and want a member of your family to have access to your record, you must provide written consent by completing a proxy form available at our office. Please speak to our clinical staff about  guidelines regarding accounts for patients younger than age 26.  As you activate your MyChart account and need any technical assistance, please call the MyChart technical support line at [phone]) or email your question to [email or web address]. If you email your question(s), please include your name, a return phone number and the best time to reach you.  If you have non-urgent health-related questions, you can send a message to our office through MyChart at [email or web address]. If you have a medical emergency, call 911.  Thank you for using MyChart as your new health and wellness resource!   MyChart licensed from [employer],  [id number]. Patents Pending.

## 2013-08-25 ENCOUNTER — Encounter: Payer: Self-pay | Admitting: Internal Medicine

## 2013-08-25 DIAGNOSIS — D509 Iron deficiency anemia, unspecified: Secondary | ICD-10-CM | POA: Insufficient documentation

## 2013-08-25 NOTE — Progress Notes (Signed)
Patient ID: Christian [name], male   DOB: 09/15/1940, 72 y.o.   MRN: [id number] HPI: Christian [name] is a 72-year-old male with a past medical history of Charcot-Marie-Tooth disease, deafness, hypertension, hyperlipidemia, diabetes, arthritis, who is seen in consultation at the request of Dr. Husain for evaluation of iron deficiency anemia.  He is here today with his wife and a sign language interpreter.  He reports that he has developed anemia and was told he needs this further evaluated. Also with his anemia he has developed angina which for him is chest pain on exertion along with dyspnea. He has seen cardiology who has recommended cardiac catheterization, but given his iron deficiency anemia they would like his GI tract evaluated first. Overall he reports he feels well. He denies overt bleeding including melena or blood per rectum. He is having 1-2 bowel movements a day without diarrhea or significant constipation. He reports he is eating well. No nausea or vomiting. No heartburn. No dysphagia. Occasionally he does have regurgitation. He started on oral iron tablets over-the-counter 2 weeks ago.  He recalls a previous colonoscopy performed 7 or 8 years ago. He feels this was done with Dr. Hung, and he was told this was normal and recommended a 10 year followup.  Past Medical History  Diagnosis Date  . Arthritis   . DM (diabetes mellitus)   . HTN (hypertension)   . HLD (hyperlipidemia)   . Overactive bladder   . CMT (Charcot-Marie-Tooth disease)     Past Surgical History  Procedure Laterality Date  . Total knee arthroplasty Bilateral 1999  . Total hip arthroplasty Bilateral 2002, 2003  . Foot surgery      age 15  . Foot amputation through metatarsal Left     Current Outpatient Prescriptions  Medication Sig Dispense Refill  . amLODipine-benazepril (LOTREL) 5-20 MG per capsule Take 1 capsule by mouth daily.      . aspirin 81 MG chewable tablet Chew 81 mg by mouth daily.      .  Cholecalciferol (VITAMIN D) 1000 UNITS capsule Take 1,000 Units by mouth 3 (three) times a week. Monday, Wednesday, Saturday      . diclofenac (VOLTAREN) 75 MG EC tablet Take 75 mg by mouth 2 (two) times daily with a meal.       . fexofenadine (ALLEGRA) 180 MG tablet Take 180 mg by mouth daily as needed for allergies.       . GuaiFENesin (MUCINEX PO) Take 1 tablet by mouth daily as needed (to loosen phlegm).      . insulin glargine (LANTUS) 100 units/mL SOLN Inject 30 Units into the skin at bedtime.      . isosorbide mononitrate (IMDUR) 30 MG 24 hr tablet Take 1 tablet (30 mg total) by mouth daily.  90 tablet  3  . LORazepam (ATIVAN) 1 MG tablet Take 1 mg by mouth at bedtime.       . Magnesium Salicylate Tetrahyd 580 MG TABS Take 580 mg by mouth daily as needed (pain). Takes only once a week when mowing.      . metFORMIN (GLUCOPHAGE) 1000 MG tablet Take 1,000 mg by mouth 2 (two) times daily with a meal.      . NITROSTAT 0.4 MG SL tablet Place 0.4 mg under the tongue every 5 (five) minutes as needed for chest pain.       . oxybutynin (DITROPAN-XL) 10 MG 24 hr tablet Take 10 mg by mouth daily as needed (for overactive bladder.).      .   oxyCODONE-acetaminophen (PERCOCET/ROXICET) 5-325 MG per tablet Take 1 tablet by mouth daily as needed (back pain).      . pioglitazone (ACTOS) 30 MG tablet Take 30 mg by mouth daily.      . polyethylene glycol (MIRALAX / GLYCOLAX) packet Take 17 g by mouth 2 (two) times daily as needed (constipation).       . rosuvastatin (CRESTOR) 10 MG tablet Take 10 mg by mouth at bedtime.       . MOVIPREP 100 G SOLR Use per prep instruction  1 kit  0   No current facility-administered medications for this visit.    No Known Allergies  Family History  Problem Relation Age of Onset  . Cervical cancer Mother     mets  . Breast cancer Sister     mets  . Diabetes Sister   . Diabetes Brother   . Heart disease Sister   . Cervical cancer Sister     History  Substance Use  Topics  . Smoking status: Never Smoker   . Smokeless tobacco: Never Used  . Alcohol Use: No    ROS: As per history of present illness, otherwise negative  BP 128/50  Pulse 80  Ht 5' 8" (1.727 m)  Wt 250 lb 6 oz (113.569 kg)  BMI 38.08 kg/m2 Constitutional: Well-developed and well-nourished. No distress. HEENT: Normocephalic and atraumatic. Oropharynx is clear and moist. No oropharyngeal exudate. Conjunctivae are normal.  No scleral icterus. Neck: Neck supple. Trachea midline. Cardiovascular: Normal rate, regular rhythm and intact distal pulses. Pulmonary/chest: Effort normal and breath sounds normal. No wheezing, rales or rhonchi. Abdominal: Soft, nontender, nondistended. Bowel sounds active throughout Extremities: no clubbing, cyanosis, or edema Neurological: Alert and oriented to person place and time. Skin: Skin is warm and dry. No rashes noted. Psychiatric: Normal mood and affect. Behavior is normal.  RELEVANT LABS AND IMAGING: CBC    Component Value Date/Time   WBC 10.1 08/22/2013 1656   RBC 3.44* 08/22/2013 1656   HGB 9.5* 08/22/2013 1656   HCT 29.3* 08/22/2013 1656   PLT 283.0 08/22/2013 1656   MCV 85.1 08/22/2013 1656   MCH 27.5 08/13/2013 1330   MCHC 32.5 08/22/2013 1656   RDW 15.9* 08/22/2013 1656   LYMPHSABS 2.7 08/13/2013 1330   MONOABS 0.7 08/13/2013 1330   EOSABS 0.2 08/13/2013 1330   BASOSABS 0.0 08/13/2013 1330    CMP     Component Value Date/Time   NA 140 08/13/2013 1330   K 4.7 08/13/2013 1330   CL 106 08/13/2013 1330   CO2 25 08/13/2013 1330   GLUCOSE 54* 08/13/2013 1330   BUN 24* 08/13/2013 1330   CREATININE 1.08 08/13/2013 1330   CREATININE 0.94 12/03/2009 1122   CALCIUM 9.3 08/13/2013 1330   PROT 7.1 12/03/2009 1122   ALBUMIN 3.3* 12/03/2009 1122   AST 24 12/03/2009 1122   ALT 25 12/03/2009 1122   ALKPHOS 192* 12/03/2009 1122   BILITOT 0.6 12/03/2009 1122   GFRNONAA >60 12/03/2009 1122   GFRAA  Value: >60        The eGFR has been calculated using the  MDRD equation. This calculation has not been validated in all clinical situations. eGFR's persistently <60 mL/min signify possible Chronic Kidney Disease. 12/03/2009 1122   Iron/TIBC/Ferritin    Component Value Date/Time   IRON 78 08/22/2013 1656   FERRITIN 12.3* 08/22/2013 1656    ASSESSMENT/PLAN: 72-year-old male with a past medical history of Charcot-Marie-Tooth disease, deafness, hypertension, hyperlipidemia, diabetes, arthritis, who is seen   in consultation at the request of Dr. Husain for evaluation of iron deficiency anemia.  1.  Iron def. Anemia, new -- we have discussed the evaluation workup of iron deficiency anemia in a male such as he.  He has not had any overt symptoms other than intermittent angina which is nitroglycerin responsive to this point. This is likely secondary to the anemia and increase in demand associated with his anemia.  He has not had any overt bleeding to his knowledge. I have recommended an upper endoscopy and colonoscopy. We will perform these in the hospital setting, next week. Cardiology is planning cardiac catheterization after GI evaluation. I recommend he continue iron supplementation. I will also send him home with FOBT cards for completeness. If upper endoscopy and colonoscopy are negative, capsule endoscopy will be considered.  2.  Angina -- intermittent. He has nitroglycerin at home. He will followup with cardiology for this issue. Given the high probability of CAD and his comorbidities, we are performing his procedures in hospital setting with monitored anesthesia care. The procedures were discussed in detail including the risks and benefits and he is agreeable to proceed.      

## 2013-08-27 ENCOUNTER — Encounter (HOSPITAL_COMMUNITY): Payer: Self-pay

## 2013-08-27 ENCOUNTER — Other Ambulatory Visit: Payer: Self-pay | Admitting: Internal Medicine

## 2013-08-27 ENCOUNTER — Encounter (HOSPITAL_COMMUNITY)
Admission: RE | Disposition: A | Discharge status: Home / Self Care | Payer: Self-pay | Source: Ambulatory Visit | Attending: Internal Medicine

## 2013-08-27 ENCOUNTER — Ambulatory Visit (HOSPITAL_COMMUNITY): Admit: 2013-08-27 | Payer: Self-pay | Admitting: Internal Medicine

## 2013-08-27 ENCOUNTER — Ambulatory Visit (HOSPITAL_COMMUNITY)
Admission: RE | Admit: 2013-08-27 | Discharge: 2013-08-27 | Disposition: A | Discharge status: Home / Self Care | Payer: Medicare Other | Source: Ambulatory Visit | Attending: Internal Medicine | Admitting: Internal Medicine

## 2013-08-27 DIAGNOSIS — Z794 Long term (current) use of insulin: Secondary | ICD-10-CM | POA: Insufficient documentation

## 2013-08-27 DIAGNOSIS — D509 Iron deficiency anemia, unspecified: Secondary | ICD-10-CM | POA: Insufficient documentation

## 2013-08-27 DIAGNOSIS — Z96659 Presence of unspecified artificial knee joint: Secondary | ICD-10-CM | POA: Insufficient documentation

## 2013-08-27 DIAGNOSIS — E119 Type 2 diabetes mellitus without complications: Secondary | ICD-10-CM | POA: Insufficient documentation

## 2013-08-27 DIAGNOSIS — G600 Hereditary motor and sensory neuropathy: Secondary | ICD-10-CM | POA: Insufficient documentation

## 2013-08-27 DIAGNOSIS — Z96649 Presence of unspecified artificial hip joint: Secondary | ICD-10-CM | POA: Insufficient documentation

## 2013-08-27 DIAGNOSIS — K5730 Diverticulosis of large intestine without perforation or abscess without bleeding: Secondary | ICD-10-CM | POA: Insufficient documentation

## 2013-08-27 DIAGNOSIS — K2940 Chronic atrophic gastritis without bleeding: Secondary | ICD-10-CM | POA: Insufficient documentation

## 2013-08-27 DIAGNOSIS — K259 Gastric ulcer, unspecified as acute or chronic, without hemorrhage or perforation: Secondary | ICD-10-CM | POA: Insufficient documentation

## 2013-08-27 DIAGNOSIS — S98919A Complete traumatic amputation of unspecified foot, level unspecified, initial encounter: Secondary | ICD-10-CM | POA: Insufficient documentation

## 2013-08-27 DIAGNOSIS — R0609 Other forms of dyspnea: Secondary | ICD-10-CM | POA: Insufficient documentation

## 2013-08-27 DIAGNOSIS — R0989 Other specified symptoms and signs involving the circulatory and respiratory systems: Secondary | ICD-10-CM | POA: Insufficient documentation

## 2013-08-27 DIAGNOSIS — K269 Duodenal ulcer, unspecified as acute or chronic, without hemorrhage or perforation: Secondary | ICD-10-CM

## 2013-08-27 DIAGNOSIS — I10 Essential (primary) hypertension: Secondary | ICD-10-CM | POA: Insufficient documentation

## 2013-08-27 DIAGNOSIS — R0789 Other chest pain: Secondary | ICD-10-CM | POA: Insufficient documentation

## 2013-08-27 DIAGNOSIS — Z79899 Other long term (current) drug therapy: Secondary | ICD-10-CM | POA: Insufficient documentation

## 2013-08-27 DIAGNOSIS — H9190 Unspecified hearing loss, unspecified ear: Secondary | ICD-10-CM | POA: Insufficient documentation

## 2013-08-27 DIAGNOSIS — K253 Acute gastric ulcer without hemorrhage or perforation: Secondary | ICD-10-CM

## 2013-08-27 DIAGNOSIS — Q438 Other specified congenital malformations of intestine: Secondary | ICD-10-CM | POA: Insufficient documentation

## 2013-08-27 DIAGNOSIS — E785 Hyperlipidemia, unspecified: Secondary | ICD-10-CM | POA: Insufficient documentation

## 2013-08-27 HISTORY — PX: ESOPHAGOGASTRODUODENOSCOPY: SHX5428

## 2013-08-27 HISTORY — PX: COLONOSCOPY: SHX5424

## 2013-08-27 LAB — GLUCOSE, CAPILLARY: Glucose-Capillary: 79 mg/dL (ref 70–99)

## 2013-08-27 SURGERY — EGD (ESOPHAGOGASTRODUODENOSCOPY)
Anesthesia: Moderate Sedation

## 2013-08-27 SURGERY — COLONOSCOPY
Anesthesia: Moderate Sedation

## 2013-08-27 MED ORDER — FENTANYL CITRATE 0.05 MG/ML IJ SOLN
INTRAMUSCULAR | Status: DC | PRN
  Administered 2013-08-27 (×3): 25 ug via INTRAVENOUS

## 2013-08-27 MED ORDER — DEXTROSE 5 % IV SOLN: INTRAVENOUS | Status: DC

## 2013-08-27 MED ORDER — PANTOPRAZOLE SODIUM 40 MG PO TBEC: 40.0000 mg | DELAYED_RELEASE_TABLET | Freq: Two times a day (BID) | ORAL | Status: DC

## 2013-08-27 MED ORDER — MIDAZOLAM HCL 10 MG/2ML IJ SOLN
INTRAMUSCULAR | Status: AC
  Filled 2013-08-27: qty 2

## 2013-08-27 MED ORDER — FENTANYL CITRATE 0.05 MG/ML IJ SOLN
INTRAMUSCULAR | Status: AC
  Filled 2013-08-27: qty 2

## 2013-08-27 MED ORDER — DEXTROSE 50 % IV SOLN
50.0000 mL | Freq: Once | INTRAVENOUS | Status: AC
  Administered 2013-08-27: 25 mL via INTRAVENOUS
  Filled 2013-08-27: qty 50

## 2013-08-27 MED ORDER — SODIUM CHLORIDE 0.9 % IV SOLN
INTRAVENOUS | Status: DC
  Administered 2013-08-27: 11:00:00 via INTRAVENOUS

## 2013-08-27 MED ORDER — MIDAZOLAM HCL 10 MG/2ML IJ SOLN
INTRAMUSCULAR | Status: DC | PRN
  Administered 2013-08-27 (×4): 2 mg via INTRAVENOUS

## 2013-08-27 MED ORDER — BUTAMBEN-TETRACAINE-BENZOCAINE 2-2-14 % EX AERO
INHALATION_SPRAY | CUTANEOUS | Status: DC | PRN
  Administered 2013-08-27: 2 via TOPICAL

## 2013-08-27 NOTE — Op Note (Signed)
[facility] [address] [place], [address]   ENDOSCOPY PROCEDURE REPORT  PATIENT: Christian, [name]  MR#: [id number] BIRTHDATE: [birth year] , 72  yrs. old GENDER: Male ENDOSCOPIST: Beverley Fiedler, MD REFERRED BY:  Georgann Housekeeper PROCEDURE DATE:  08/27/2013 PROCEDURE:  EGD w/ biopsy ASA CLASS:     Class III INDICATIONS:  Iron deficiency anemia. MEDICATIONS: These medications were titrated to patient response per physician's verbal order, Versed 8 mg IV, and Fentanyl 75 mcg IV TOPICAL ANESTHETIC: Cetacaine Spray  DESCRIPTION OF PROCEDURE: After the risks benefits and alternatives of the procedure were thoroughly explained, informed consent was obtained.  The Pentax Gastroscope [id number] endoscope was introduced through the mouth and advanced to the second portion of the duodenum. Without limitations.  The instrument was slowly withdrawn as the mucosa was fully examined.      ESOPHAGUS: The mucosa of the esophagus appeared normal.  Z-line regular at 40 cm from the incisors.  STOMACH: Four, scattered, non-bleeding irregular shaped and clean-based ulcers, ranging between 3-6 mm in size, were found in the gastric antrum. There was associated gastritis in the gastric body and antrum.  Multiple biopsies were taken at the edges of several ulcers and also at the areas of gastritis.  DUODENUM: Two non-bleeding round and clean-based ulcers, ranging between 3-27mm in size, with surrounding edema were found in the duodenal sweep.  Minimal duodenitis in the duodenal bulb.  The second portion of the duodenum was unremarkable.  Retroflexed views revealed no abnormalities.     The scope was then withdrawn from the patient and the procedure completed.  COMPLICATIONS: There were no complications.  ENDOSCOPIC IMPRESSION: 1.   The mucosa of the esophagus appeared normal 2.   Scattered gastric ulcers, ranging between 3-5 mm in size, were found in the gastric antrum with  associated acute gastritis. Multiple biopsies obtained 3.   Two non-bleeding ulcers, ranging between 3-59mm in size, were found at the duodenal sweep with minimal associated duodenitis  RECOMMENDATIONS: 1.  Await pathology results; H. pylori followup, treat if positive 2.  Twice daily PPI 3.  Avoid NSAIDs 4.  Continued iron supplementation while following hemoglobin and iron stores to ensure improvement 5.  GI office followup in about 6 weeks  eSigned:  Beverley Fiedler, MD 08/27/2013 1:21 PM   CC:The Patient and Georgann Housekeeper MD; Everette Rank, MD  PATIENT NAME:  Christian, [name] MR#: [id number]

## 2013-08-27 NOTE — Interval H&P Note (Signed)
History and Physical Interval Note: Patient presents for upper endoscopy and colonoscopy to evaluate iron deficiency anemia The nature of the procedure, as well as the risks, benefits, and alternatives were carefully and thoroughly reviewed with the patient. Ample time for discussion and questions allowed. The patient understood, was satisfied, and agreed to proceed.     08/27/2013 12:09 PM  Christian [name]  has presented today for surgery, with the diagnosis of Anemia [285.9] Colon cancer screening [V76.51]  The various methods of treatment have been discussed with the patient and family. After consideration of risks, benefits and other options for treatment, the patient has consented to  Procedure(s): ESOPHAGOGASTRODUODENOSCOPY (EGD) (N/A) COLONOSCOPY (N/A) as a surgical intervention .  The patient's history has been reviewed, patient examined, no change in status, stable for surgery.  I have reviewed the patient's chart and labs.  Questions were answered to the patient's satisfaction.     PYRTLE, JAY M

## 2013-08-27 NOTE — H&P (View-Only) (Signed)
Patient ID: Christian [name], male   DOB: 04-13-1941, 72 y.o.   MRN: [id number] HPI: Christian [name] is a 72 year old male with a past medical history of Charcot-Marie-Tooth disease, deafness, hypertension, hyperlipidemia, diabetes, arthritis, who is seen in consultation at the request of Dr. Donette Larry for evaluation of iron deficiency anemia.  He is here today with his wife and a sign language interpreter.  He reports that he has developed anemia and was told he needs this further evaluated. Also with his anemia he has developed angina which for him is chest pain on exertion along with dyspnea. He has seen cardiology who has recommended cardiac catheterization, but given his iron deficiency anemia they would like his GI tract evaluated first. Overall he reports he feels well. He denies overt bleeding including melena or blood per rectum. He is having 1-2 bowel movements a day without diarrhea or significant constipation. He reports he is eating well. No nausea or vomiting. No heartburn. No dysphagia. Occasionally he does have regurgitation. He started on oral iron tablets over-the-counter 2 weeks ago.  He recalls a previous colonoscopy performed 7 or 8 years ago. He feels this was done with Dr. Elnoria Howard, and he was told this was normal and recommended a 10 year followup.  Past Medical History  Diagnosis Date  . Arthritis   . DM (diabetes mellitus)   . HTN (hypertension)   . HLD (hyperlipidemia)   . Overactive bladder   . CMT (Charcot-Marie-Tooth disease)     Past Surgical History  Procedure Laterality Date  . Total knee arthroplasty Bilateral 1999  . Total hip arthroplasty Bilateral 2002, 2003  . Foot surgery      age 59  . Foot amputation through metatarsal Left     Current Outpatient Prescriptions  Medication Sig Dispense Refill  . amLODipine-benazepril (LOTREL) 5-20 MG per capsule Take 1 capsule by mouth daily.      Marland Kitchen aspirin 81 MG chewable tablet Chew 81 mg by mouth daily.      .  Cholecalciferol (VITAMIN D) 1000 UNITS capsule Take 1,000 Units by mouth 3 (three) times a week. Monday, Wednesday, Saturday      . diclofenac (VOLTAREN) 75 MG EC tablet Take 75 mg by mouth 2 (two) times daily with a meal.       . fexofenadine (ALLEGRA) 180 MG tablet Take 180 mg by mouth daily as needed for allergies.       . GuaiFENesin (MUCINEX PO) Take 1 tablet by mouth daily as needed (to loosen phlegm).      . insulin glargine (LANTUS) 100 units/mL SOLN Inject 30 Units into the skin at bedtime.      . isosorbide mononitrate (IMDUR) 30 MG 24 hr tablet Take 1 tablet (30 mg total) by mouth daily.  90 tablet  3  . LORazepam (ATIVAN) 1 MG tablet Take 1 mg by mouth at bedtime.       . Magnesium Salicylate Tetrahyd 580 MG TABS Take 580 mg by mouth daily as needed (pain). Takes only once a week when mowing.      . metFORMIN (GLUCOPHAGE) 1000 MG tablet Take 1,000 mg by mouth 2 (two) times daily with a meal.      . NITROSTAT 0.4 MG SL tablet Place 0.4 mg under the tongue every 5 (five) minutes as needed for chest pain.       Marland Kitchen oxybutynin (DITROPAN-XL) 10 MG 24 hr tablet Take 10 mg by mouth daily as needed (for overactive bladder.).      Marland Kitchen  oxyCODONE-acetaminophen (PERCOCET/ROXICET) 5-325 MG per tablet Take 1 tablet by mouth daily as needed (back pain).      . pioglitazone (ACTOS) 30 MG tablet Take 30 mg by mouth daily.      . polyethylene glycol (MIRALAX / GLYCOLAX) packet Take 17 g by mouth 2 (two) times daily as needed (constipation).       . rosuvastatin (CRESTOR) 10 MG tablet Take 10 mg by mouth at bedtime.       Marland Kitchen MOVIPREP 100 G SOLR Use per prep instruction  1 kit  0   No current facility-administered medications for this visit.    No Known Allergies  Family History  Problem Relation Age of Onset  . Cervical cancer Mother     mets  . Breast cancer Sister     mets  . Diabetes Sister   . Diabetes Brother   . Heart disease Sister   . Cervical cancer Sister     History  Substance Use  Topics  . Smoking status: Never Smoker   . Smokeless tobacco: Never Used  . Alcohol Use: No    ROS: As per history of present illness, otherwise negative  BP 128/50  Pulse 80  Ht 5\' 8"  (1.727 m)  Wt 250 lb 6 oz (113.569 kg)  BMI 38.08 kg/m2 Constitutional: Well-developed and well-nourished. No distress. HEENT: Normocephalic and atraumatic. Oropharynx is clear and moist. No oropharyngeal exudate. Conjunctivae are normal.  No scleral icterus. Neck: Neck supple. Trachea midline. Cardiovascular: Normal rate, regular rhythm and intact distal pulses. Pulmonary/chest: Effort normal and breath sounds normal. No wheezing, rales or rhonchi. Abdominal: Soft, nontender, nondistended. Bowel sounds active throughout Extremities: no clubbing, cyanosis, or edema Neurological: Alert and oriented to person place and time. Skin: Skin is warm and dry. No rashes noted. Psychiatric: Normal mood and affect. Behavior is normal.  RELEVANT LABS AND IMAGING: CBC    Component Value Date/Time   WBC 10.1 08/22/2013 1656   RBC 3.44* 08/22/2013 1656   HGB 9.5* 08/22/2013 1656   HCT 29.3* 08/22/2013 1656   PLT 283.0 08/22/2013 1656   MCV 85.1 08/22/2013 1656   MCH 27.5 08/13/2013 1330   MCHC 32.5 08/22/2013 1656   RDW 15.9* 08/22/2013 1656   LYMPHSABS 2.7 08/13/2013 1330   MONOABS 0.7 08/13/2013 1330   EOSABS 0.2 08/13/2013 1330   BASOSABS 0.0 08/13/2013 1330    CMP     Component Value Date/Time   NA 140 08/13/2013 1330   K 4.7 08/13/2013 1330   CL 106 08/13/2013 1330   CO2 25 08/13/2013 1330   GLUCOSE 54* 08/13/2013 1330   BUN 24* 08/13/2013 1330   CREATININE 1.08 08/13/2013 1330   CREATININE 0.94 12/03/2009 1122   CALCIUM 9.3 08/13/2013 1330   PROT 7.1 12/03/2009 1122   ALBUMIN 3.3* 12/03/2009 1122   AST 24 12/03/2009 1122   ALT 25 12/03/2009 1122   ALKPHOS 192* 12/03/2009 1122   BILITOT 0.6 12/03/2009 1122   GFRNONAA >60 12/03/2009 1122   GFRAA  Value: >60        The eGFR has been calculated using the  MDRD equation. This calculation has not been validated in all clinical situations. eGFR's persistently <60 mL/min signify possible Chronic Kidney Disease. 12/03/2009 1122   Iron/TIBC/Ferritin    Component Value Date/Time   IRON 78 08/22/2013 1656   FERRITIN 12.3* 08/22/2013 1656    ASSESSMENT/PLAN: 72 year old male with a past medical history of Charcot-Marie-Tooth disease, deafness, hypertension, hyperlipidemia, diabetes, arthritis, who is seen  in consultation at the request of Dr. Donette Larry for evaluation of iron deficiency anemia.  1.  Iron def. Anemia, new -- we have discussed the evaluation workup of iron deficiency anemia in a male such as he.  He has not had any overt symptoms other than intermittent angina which is nitroglycerin responsive to this point. This is likely secondary to the anemia and increase in demand associated with his anemia.  He has not had any overt bleeding to his knowledge. I have recommended an upper endoscopy and colonoscopy. We will perform these in the hospital setting, next week. Cardiology is planning cardiac catheterization after GI evaluation. I recommend he continue iron supplementation. I will also send him home with FOBT cards for completeness. If upper endoscopy and colonoscopy are negative, capsule endoscopy will be considered.  2.  Angina -- intermittent. He has nitroglycerin at home. He will followup with cardiology for this issue. Given the high probability of CAD and his comorbidities, we are performing his procedures in hospital setting with monitored anesthesia care. The procedures were discussed in detail including the risks and benefits and he is agreeable to proceed.

## 2013-08-27 NOTE — Op Note (Signed)
[facility] [address] [place], [address]   COLONOSCOPY PROCEDURE REPORT  PATIENT: Christian [name], Christian [name]  MR#: [id number] BIRTHDATE: August 21, 1941 , 72  yrs. old GENDER: Male ENDOSCOPIST: Beverley Fiedler, MD REFERRED NG:EXBMWU Husain, M.D. PROCEDURE DATE:  08/27/2013 PROCEDURE:   Colonoscopy, diagnostic First Screening Colonoscopy - Avg.  risk and is 50 yrs.  old or older - No.  Prior Negative Screening - Now for repeat screening. N/A  History of Adenoma - Now for follow-up colonoscopy & has been > or = to 3 yrs.  N/A  Polyps Removed Today? No.  Recommend repeat exam, <10 yrs? No. ASA CLASS:   Class III INDICATIONS:Iron Deficiency Anemia. MEDICATIONS: Fentanyl 75 mcg IV, Versed 8 mg IV, and These medications were titrated to patient response per physician's verbal order  DESCRIPTION OF PROCEDURE:   After the risks benefits and alternatives of the procedure were thoroughly explained, informed consent was obtained.  A digital rectal exam revealed external hemorrhoids.   The Pentax adult colonoscope was introduced through the anus and advanced to the cecum, which was identified by both the appendix and ileocecal valve. No adverse events experienced. The quality of the prep was good, using MoviPrep  The instrument was then slowly withdrawn as the colon was fully examined.   COLON FINDINGS: Tortuous and redundant left colon with looping requiring manual pressure.  The cecal base was incompletely visualized due to inability to seat the scope in this area. Moderate diverticulosis was noted in the ascending colon, descending colon, and sigmoid colon.   The colon was otherwise normal.  There was no inflammation, polyps or cancers seen. Retroflexed views revealed external hemorrhoids.        The scope was withdrawn and the procedure completed.  COMPLICATIONS: There were no complications.  ENDOSCOPIC IMPRESSION: Moderate diverticulosis was noted in the ascending colon,  descending colon, and sigmoid colon Tortuous left colon; cecal base not completely visualized despite best attempt No colonic source found to explain iron deficiency (see EGD report)  RECOMMENDATIONS: 1.  High fiber diet 2.  Consider barium enema or CT colonography to examined portions of the cecum which were incompletely visualized due to tortuosity and redundancy. 3.  GI Office followup in 6 weeks  eSigned:  Beverley Fiedler, MD 08/27/2013 1:31 PM  cc: The Patient and Georgann Housekeeper MD; Everette Rank, MD

## 2013-08-28 ENCOUNTER — Encounter (HOSPITAL_COMMUNITY): Payer: Self-pay | Admitting: Internal Medicine

## 2013-09-01 ENCOUNTER — Telehealth: Payer: Self-pay | Admitting: Cardiology

## 2013-09-01 NOTE — Telephone Encounter (Addendum)
Per Dr. Eldridge Dace call pt and see how he is feeling after recent GI work-up.

## 2013-09-02 ENCOUNTER — Telehealth: Payer: Self-pay | Admitting: Interventional Cardiology

## 2013-09-02 NOTE — Telephone Encounter (Signed)
New Problem:  Pt has questions for Amy. Sign Language interpreter on the phone.. Trying to connect interpreter to Amy.

## 2013-09-02 NOTE — Telephone Encounter (Signed)
Spoke with pt and he is feeling fine. Pt denies CP and SOB. Pt hasn't been working he has been at home.

## 2013-09-02 NOTE — Telephone Encounter (Signed)
Spoke with pts wife and she would like to know if pt should continue imdur since he is not having pain. I told her to tell pt to continue imdur until I speak with Dr. Eldridge Dace next week in Clinic.

## 2013-09-03 NOTE — Telephone Encounter (Signed)
OK to stop imdur

## 2013-09-03 NOTE — Telephone Encounter (Signed)
See how [facility] is doing before pursuing cardiac w/u.

## 2013-09-08 NOTE — Telephone Encounter (Signed)
Pt notified to stop imdur.

## 2013-09-08 NOTE — Addendum Note (Signed)
Addended byOrlene Plum H on: 09/08/2013 08:03 AM   Modules accepted: Orders, Medications

## 2013-09-08 NOTE — Telephone Encounter (Signed)
Noted, pt has f/u with Dr. Donette Larry today. Hopefully he will check hgb.

## 2013-09-09 ENCOUNTER — Telehealth: Payer: Self-pay | Admitting: Internal Medicine

## 2013-09-09 MED ORDER — TRAMADOL HCL 50 MG PO TABS: ORAL_TABLET | ORAL | Status: DC

## 2013-09-09 NOTE — Telephone Encounter (Signed)
Pt is having joint pain; arthritis? He is off his NSAIDS d/t Ulcers. He has an old script for Percocet; has never taken Ultram before. Please advise. Thanks.

## 2013-09-09 NOTE — Telephone Encounter (Signed)
Per Dr Rhea Belton, please order Ultram for pt; 50 mg, 1-2 TID prn for pain. Informed pt thru the interpreter and he voiced understanding. Informed him we will close tomorrow at 2 pm until Friday, please call if no better.

## 2013-09-12 ENCOUNTER — Telehealth: Payer: Self-pay | Admitting: Internal Medicine

## 2013-09-15 ENCOUNTER — Telehealth: Payer: Self-pay | Admitting: Gastroenterology

## 2013-09-15 ENCOUNTER — Other Ambulatory Visit: Payer: Self-pay | Admitting: Gastroenterology

## 2013-09-15 MED ORDER — OXYCODONE-ACETAMINOPHEN 5-325 MG PO TABS: 1.0000 | ORAL_TABLET | Freq: Every day | ORAL | Status: DC | PRN

## 2013-09-15 NOTE — Telephone Encounter (Signed)
Through interpretor left voicemail letting pt know Dr. Hilarie Fredrickson will refill medication if he is experiencing joint pain.

## 2013-09-15 NOTE — Telephone Encounter (Signed)
Pt called back through interpretor, and said he was having a lot of back pain. i told him I will leave the Rx for him to pick up at our front desk. Pt verbalized understanding.

## 2013-09-18 ENCOUNTER — Encounter: Payer: Self-pay | Admitting: Interventional Cardiology

## 2013-09-18 ENCOUNTER — Encounter (INDEPENDENT_AMBULATORY_CARE_PROVIDER_SITE_OTHER): Payer: Self-pay

## 2013-09-18 ENCOUNTER — Ambulatory Visit (INDEPENDENT_AMBULATORY_CARE_PROVIDER_SITE_OTHER): Payer: Medicare Other | Admitting: Interventional Cardiology

## 2013-09-18 VITALS — BP 148/73 | HR 88 | Ht 70.0 in | Wt 225.0 lb

## 2013-09-18 DIAGNOSIS — R079 Chest pain, unspecified: Secondary | ICD-10-CM

## 2013-09-18 DIAGNOSIS — I10 Essential (primary) hypertension: Secondary | ICD-10-CM

## 2013-09-18 DIAGNOSIS — K253 Acute gastric ulcer without hemorrhage or perforation: Secondary | ICD-10-CM

## 2013-09-18 DIAGNOSIS — I209 Angina pectoris, unspecified: Secondary | ICD-10-CM

## 2013-09-18 NOTE — Progress Notes (Signed)
Patient ID: Christian [name], male   DOB: [birth year], 73 y.o.   MRN: [id number] ECG shows NSR, Non specific ST segment changes Serafina Topham S.

## 2013-09-18 NOTE — Patient Instructions (Signed)
Your physician wants you to follow-up in: 4 MONTHS with Dr. Irish Lack. You will receive a reminder letter in the mail two months in advance. If you don't receive a letter, please call our office to schedule the follow-up appointment.  Your physician recommends that you continue on your current medications as directed. Please refer to the Current Medication list given to you today.

## 2013-09-18 NOTE — Progress Notes (Signed)
Patient ID: Christian [name], male   DOB: [birth year], 73 y.o.   MRN: [id number]    [facility] [place] [place], [place]  [address] Phone: [phone] Fax:  [phone]  Date:  09/18/2013   ID:  Christian, [name] Nov 26, 1940, MRN [id number]  PCP:  Wenda Low, MD      History of Present Illness: Christian [name] is a 73 y.o. male who had angina several weeks ago. He was scheduled for cardiac catheterization. During the workup for cath, he was found to be anemic. He underwent a GI evaluation. He had Hemoccult positive stool. He underwent EGD which showed ulcers but there was no obvious source of bleeding noted. His hemoglobin has increased with iron supplementation.  Since his blood count has been back in the normal range, he has not had any exertional chest discomfort. He continues to walk. His wife reports that he is not walking as much as he use to. He has not had a use any nitroglycerin under his tongue. Overall, he is feeling better.   Wt Readings from Last 3 Encounters:  09/18/13 225 lb (102.059 kg)  08/22/13 250 lb 6 oz (113.569 kg)  08/13/13 245 lb (111.131 kg)     Past Medical History  Diagnosis Date  . Arthritis   . DM (diabetes mellitus)   . HTN (hypertension)   . HLD (hyperlipidemia)   . Overactive bladder   . CMT (Charcot-Marie-Tooth disease)     Current Outpatient Prescriptions  Medication Sig Dispense Refill  . amLODipine-benazepril (LOTREL) 5-20 MG per capsule Take 1 capsule by mouth daily.      Marland Kitchen aspirin 81 MG chewable tablet Chew 81 mg by mouth daily.      . Cholecalciferol (VITAMIN D) 1000 UNITS capsule Take 1,000 Units by mouth 3 (three) times a week. Monday, Wednesday, Saturday      . fexofenadine (ALLEGRA) 180 MG tablet Take 180 mg by mouth daily as needed for allergies.       . GuaiFENesin (MUCINEX PO) Take 1 tablet by mouth daily as needed (to loosen phlegm).      . insulin glargine (LANTUS) 100 units/mL SOLN Inject 30 Units into the skin at  bedtime.      . isosorbide mononitrate (IMDUR) 30 MG 24 hr tablet       . LORazepam (ATIVAN) 1 MG tablet Take 1 mg by mouth at bedtime.       . metFORMIN (GLUCOPHAGE) 1000 MG tablet Take 1,000 mg by mouth 2 (two) times daily with a meal.      . MOVIPREP 100 G SOLR Use per prep instruction  1 kit  0  . NITROSTAT 0.4 MG SL tablet Place 0.4 mg under the tongue every 5 (five) minutes as needed for chest pain.       Marland Kitchen oxybutynin (DITROPAN-XL) 10 MG 24 hr tablet Take 10 mg by mouth daily as needed (for overactive bladder.).      Marland Kitchen oxyCODONE-acetaminophen (PERCOCET/ROXICET) 5-325 MG per tablet Take 1 tablet by mouth daily as needed (back pain).  30 tablet  0  . pantoprazole (PROTONIX) 40 MG tablet Take 1 tablet (40 mg total) by mouth 2 (two) times daily before a meal.  120 tablet  2  . pioglitazone (ACTOS) 30 MG tablet Take 30 mg by mouth daily.      . polyethylene glycol (MIRALAX / GLYCOLAX) packet Take 17 g by mouth 2 (two) times daily as needed (constipation).       Marland Kitchen  rosuvastatin (CRESTOR) 10 MG tablet Take 10 mg by mouth at bedtime.       . traMADol (ULTRAM) 50 MG tablet Take 1-2 tablets by mouth up to 3 times daily when needed for pain.  100 tablet  0   No current facility-administered medications for this visit.    Allergies:   No Known Allergies  Social History:  The patient  reports that he has never smoked. He has never used smokeless tobacco. He reports that he does not drink alcohol or use illicit drugs.   Family History:  The patient's family history includes Breast cancer in his sister; Cervical cancer in his mother and sister; Diabetes in his brother and sister; Heart disease in his sister.   ROS:  Please see the history of present illness.  No nausea, vomiting.  No fevers, chills.  No focal weakness.  No dysuria.    All other systems reviewed and negative.   PHYSICAL EXAM: VS:  BP 148/73  Pulse 88  Ht _0  (1.778 m)  Wt 225 lb (102.059 kg)  BMI 32.28 kg/m2 Well nourished,  well developed, in no acute distress HEENT: normal Neck: no JVD, no carotid bruits Cardiac:  normal S1, S2; RRR;  Lungs:  clear to auscultation bilaterally, no wheezing, rhonchi or rales Abd: soft, nontender, no hepatomegaly Ext: Bilateral pitting edema, left leg brace Skin: warm and dry Neuro:   no focal abnormalities noted     ASSESSMENT AND PLAN:  1. Angina: We discussed cardiac cath again. Since his symptoms have resolved, we'll hold off. He will try to increase his activity level. If he has more symptoms, I would plan for cardiac cath given his multiple risk factors for coronary artery disease.  He will contact us if symptoms return. Otherwise, I will see him back in 4 months. He is following up with GI in the next couple of weeks. 2.  Hypertension: Borderline control today. Continue current antihypertensive medicines. He is also on amlodipine which should help reduce angina.  3. Ulcers: Would need to be sure that he is ok for dual antiplatelet therapy prior to a cath.   Signed, Mina Marble, MD, [facility] 09/18/2013 1:51 PM

## 2013-10-10 ENCOUNTER — Ambulatory Visit: Payer: Medicare Other | Admitting: Internal Medicine

## 2013-10-15 ENCOUNTER — Encounter: Payer: Self-pay | Admitting: Internal Medicine

## 2013-10-20 ENCOUNTER — Other Ambulatory Visit (INDEPENDENT_AMBULATORY_CARE_PROVIDER_SITE_OTHER): Payer: Medicare Other

## 2013-10-20 ENCOUNTER — Encounter: Payer: Self-pay | Admitting: Internal Medicine

## 2013-10-20 ENCOUNTER — Ambulatory Visit (INDEPENDENT_AMBULATORY_CARE_PROVIDER_SITE_OTHER): Payer: Medicare Other | Admitting: Internal Medicine

## 2013-10-20 VITALS — BP 158/68 | HR 100 | Ht 68.0 in | Wt 227.2 lb

## 2013-10-20 DIAGNOSIS — K259 Gastric ulcer, unspecified as acute or chronic, without hemorrhage or perforation: Secondary | ICD-10-CM

## 2013-10-20 DIAGNOSIS — D509 Iron deficiency anemia, unspecified: Secondary | ICD-10-CM

## 2013-10-20 LAB — CBC
HCT: 37.5 % — ABNORMAL LOW (ref 39.0–52.0)
HEMOGLOBIN: 11.8 g/dL — AB (ref 13.0–17.0)
MCHC: 31.6 g/dL (ref 30.0–36.0)
MCV: 82.3 fl (ref 78.0–100.0)
Platelets: 264 10*3/uL (ref 150.0–400.0)
RBC: 4.55 Mil/uL (ref 4.22–5.81)
RDW: 16 % — ABNORMAL HIGH (ref 11.5–14.6)
WBC: 11.1 10*3/uL — AB (ref 4.5–10.5)

## 2013-10-20 LAB — IBC PANEL
Iron: 33 ug/dL — ABNORMAL LOW (ref 42–165)
SATURATION RATIOS: 7.6 % — AB (ref 20.0–50.0)
Transferrin: 311.3 mg/dL (ref 212.0–360.0)

## 2013-10-20 LAB — FERRITIN: FERRITIN: 13.6 ng/mL — AB (ref 22.0–322.0)

## 2013-10-20 MED ORDER — PANTOPRAZOLE SODIUM 40 MG PO TBEC
40.0000 mg | DELAYED_RELEASE_TABLET | Freq: Two times a day (BID) | ORAL | Status: DC
Start: 2013-10-20 — End: 2013-12-24

## 2013-10-20 NOTE — Progress Notes (Signed)
Subjective:    Patient ID: Christian [name], male    DOB: June 09, 1941, 73 y.o.   MRN: [id number]  HPI Christian [name] is a 73 year old male with a past medical history of Charcot-Marie-Tooth disease, deafness, hypertension, hyperlipidemia, diabetes, arthritis, and iron deficiency anemia who is seen for followup. After being seen initially for iron deficiency anemia he came for upper endoscopy and colonoscopy performed on 08/27/2013. Upper endoscopy revealed scattered gastric ulcers with acute gastritis. There were 2 nonbleeding ulcers found in the duodenal sweep.  Colonoscopy performed on the same day showed moderate diverticulosis in the ascending and left colon, tortuous left colon and incompletely visualize cecal base. Biopsies of the stomach were negative for H. pylori. He was taking diclofenac which was discontinued. He was started on pantoprazole 40 mg twice daily. He returns today with his wife and 2 sign language medical interpreters.  He reports he is feeling well. He is using MiraLax off and on to help with history of constipation. Constipation is not an issue of late. He denies blood in his stool or melena. Energy levels have improved somewhat. He is using tramadol for joint pains and has avoided NSAIDs. He denies epigastric pain, nausea or vomiting. No significant heartburn. Using pantoprazole 40 mg twice daily.  For the last one to 2 days he's had a mild sore throat without significant cough.   Review of Systems As per history of present illness, otherwise negative    Objective:   Physical Exam BP 158/68  Pulse 100  Ht _0  (1.727 m)  Wt 227 lb 4 oz (103.08 kg)  BMI 34.56 kg/m2 Constitutional: Well-developed and well-nourished. No distress. HEENT: Normocephalic and atraumatic. Oropharynx is clear and moist. No oropharyngeal exudate. Conjunctivae are normal.  No scleral icterus. Neck: Neck supple. Trachea midline. Cardiovascular: Normal rate, regular rhythm and intact distal  pulses. No M/R/G Pulmonary/chest: Effort normal and breath sounds normal. No wheezing, rales or rhonchi. Abdominal: Soft, nontender, nondistended. Bowel sounds active throughout.  Extremities: no clubbing, cyanosis, or edema Neurological: Alert and oriented to person place and time. Skin: Skin is warm and dry. No rashes noted. Psychiatric: Normal mood and affect. Behavior is normal.  CBC    Component Value Date/Time   WBC 10.1 08/22/2013 1656   RBC 3.44* 08/22/2013 1656   HGB 9.5* 08/22/2013 1656   HCT 29.3* 08/22/2013 1656   PLT 283.0 08/22/2013 1656   MCV 85.1 08/22/2013 1656   MCH 27.5 08/13/2013 1330   MCHC 32.5 08/22/2013 1656   RDW 15.9* 08/22/2013 1656   LYMPHSABS 2.7 08/13/2013 1330   MONOABS 0.7 08/13/2013 1330   EOSABS 0.2 08/13/2013 1330   BASOSABS 0.0 08/13/2013 1330    CMP     Component Value Date/Time   NA 140 08/13/2013 1330   K 4.7 08/13/2013 1330   CL 106 08/13/2013 1330   CO2 25 08/13/2013 1330   GLUCOSE 54* 08/13/2013 1330   BUN 24* 08/13/2013 1330   CREATININE 1.08 08/13/2013 1330   CREATININE 0.94 12/03/2009 1122   CALCIUM 9.3 08/13/2013 1330   PROT 7.1 12/03/2009 1122   ALBUMIN 3.3* 12/03/2009 1122   AST 24 12/03/2009 1122   ALT 25 12/03/2009 1122   ALKPHOS 192* 12/03/2009 1122   BILITOT 0.6 12/03/2009 1122   GFRNONAA >60 12/03/2009 1122   GFRAA  Value: >60        The eGFR has been calculated using the MDRD equation. This calculation has not been validated in all clinical situations. eGFR's persistently <  60 mL/min signify possible Chronic Kidney Disease. 12/03/2009 1122    Iron/TIBC/Ferritin    Component Value Date/Time   IRON 78 08/22/2013 1656   FERRITIN 12.3* 08/22/2013 1656       Assessment & Plan:   73 year old male with a past medical history of Charcot-Marie-Tooth disease, deafness, hypertension, hyperlipidemia, diabetes, arthritis, and iron deficiency anemia who is seen for followup.  1.  IDA -- I do feel that his iron deficiency anemia was secondary  to gastric and duodenal ulcer disease. This is felt to be NSAID related as biopsies were negative for H. pylori. He has remained on twice a day PPI, which I recommended he continue for 12 weeks total. After this I recommended daily PPI. I would like for him to continue to avoid NSAIDs if possible. He is not convinced that these ulcers were definitely the cause of his anemia, and given the possibility of occult small bowel bleeding, we will proceed to be a capsule endoscopy. We discussed the risks and benefits and he is agreeable to proceed. I will repeat CBC and iron studies today.  2.  Gastric and duodenal ulcer disease -- see #1  3.  CRC screening -- colonoscopy recently completed. Cecal base is not completely visualized. Barium enema or CT colonoscopy discussed at this time he would like to complete the video capsule endoscopy before proceeding any further colonic testing.

## 2013-10-20 NOTE — Patient Instructions (Signed)
We have sent the following medications to your pharmacy for you to pick up at your convenience: Protonix, take 1 capsule twice daily for 30 days then start taking 1 capsule daily.    Your physician has requested that you go to the basement for the following lab work before leaving today: CBC, Iron panel

## 2013-10-20 NOTE — Addendum Note (Signed)
Addended by: Marlon Pel on: 10/20/2013 02:04 PM   Modules accepted: Orders

## 2013-11-12 ENCOUNTER — Ambulatory Visit (INDEPENDENT_AMBULATORY_CARE_PROVIDER_SITE_OTHER): Payer: Medicare Other | Admitting: Internal Medicine

## 2013-11-12 DIAGNOSIS — D500 Iron deficiency anemia secondary to blood loss (chronic): Secondary | ICD-10-CM

## 2013-11-12 NOTE — Progress Notes (Signed)
Pt here for capsule endo, completed prep as instructed. Pt tolerated procedure well without difficulty.   Capsule lot #[id number] Exp: 11/2014

## 2013-11-14 ENCOUNTER — Telehealth: Payer: Self-pay

## 2013-11-14 NOTE — Telephone Encounter (Signed)
I have left a message via the relay service to please call back to discuss results of capsule endoscopy

## 2013-11-14 NOTE — Telephone Encounter (Signed)
Patient aware of the results.  He is scheduled for follow up 12/24/13

## 2013-11-17 ENCOUNTER — Encounter: Payer: Self-pay | Admitting: Internal Medicine

## 2013-12-23 ENCOUNTER — Encounter: Payer: Self-pay | Admitting: Internal Medicine

## 2013-12-24 ENCOUNTER — Encounter: Payer: Self-pay | Admitting: Internal Medicine

## 2013-12-24 ENCOUNTER — Ambulatory Visit (INDEPENDENT_AMBULATORY_CARE_PROVIDER_SITE_OTHER): Payer: Medicare Other | Admitting: Internal Medicine

## 2013-12-24 ENCOUNTER — Other Ambulatory Visit (INDEPENDENT_AMBULATORY_CARE_PROVIDER_SITE_OTHER): Payer: Medicare Other

## 2013-12-24 VITALS — BP 130/78 | HR 71 | Ht 69.0 in | Wt 243.0 lb

## 2013-12-24 DIAGNOSIS — D509 Iron deficiency anemia, unspecified: Secondary | ICD-10-CM

## 2013-12-24 DIAGNOSIS — K259 Gastric ulcer, unspecified as acute or chronic, without hemorrhage or perforation: Secondary | ICD-10-CM

## 2013-12-24 DIAGNOSIS — Z1211 Encounter for screening for malignant neoplasm of colon: Secondary | ICD-10-CM

## 2013-12-24 LAB — CBC
HCT: 35.5 % — ABNORMAL LOW (ref 39.0–52.0)
Hemoglobin: 11.6 g/dL — ABNORMAL LOW (ref 13.0–17.0)
MCHC: 32.8 g/dL (ref 30.0–36.0)
MCV: 79 fl (ref 78.0–100.0)
Platelets: 250 10*3/uL (ref 150.0–400.0)
RBC: 4.49 Mil/uL (ref 4.22–5.81)
RDW: 20 % — ABNORMAL HIGH (ref 11.5–14.6)
WBC: 8.1 10*3/uL (ref 4.5–10.5)

## 2013-12-24 LAB — IBC PANEL
IRON: 75 ug/dL (ref 42–165)
Saturation Ratios: 16.1 % — ABNORMAL LOW (ref 20.0–50.0)
TRANSFERRIN: 333.3 mg/dL (ref 212.0–360.0)

## 2013-12-24 LAB — FERRITIN: Ferritin: 22.7 ng/mL (ref 22.0–322.0)

## 2013-12-24 MED ORDER — PANTOPRAZOLE SODIUM 40 MG PO TBEC: 40.0000 mg | DELAYED_RELEASE_TABLET | Freq: Every day | ORAL | Status: DC

## 2013-12-24 NOTE — Patient Instructions (Signed)
Your physician has requested that you go to the basement for the following lab work before leaving today: iron Studies  Continue taking Pantoprazole 40 mg daily  You have filled out a Cologuard from. [facility] will contact you to send out the kit you will need along with instructions.     

## 2013-12-24 NOTE — Progress Notes (Signed)
Subjective:    Patient ID: Christian [name], male    DOB: 1941-01-04, 73 y.o.   MRN: [id number]  HPI [name] is a 73 year old male with a past medical history of Charcot-Marie-Tooth disease, deafness, hypertension, hyperlipidemia, diabetes, arthritis, and gastric ulcers with iron deficiency anemia who is seen for followup. Since being seen here last in February he had a video capsule endoscopy which showed some very mild bulbar duodenitis but was otherwise normal throughout the small bowel. No other source for iron iron deficiency or chronic blood loss was seen. He reports he is feeling well. He has decreased pantoprazole 40 mg once daily. He denies abdominal pain. Reports a good appetite. Better energy level. No fatigue. No nausea or vomiting. No trouble with heartburn, dysphagia or odynophagia. Bowel habits have improved for him and he is no longer having constipation. He denies visible blood in his stool or melena. He is avoiding NSAIDs and using tramadol for chronic joint pains.  Review of Systems As per history of present illness, otherwise negative  Current Medications, Allergies, Past Medical History, Past Surgical History, Family History and Social History were reviewed in [employer] record.     Objective:   Physical Exam BP 130/78  Pulse 71  Ht 5\' 9"  (1.753 m)  Wt 243 lb (110.224 kg)  BMI 35.87 kg/m2 Constitutional: Well-developed and well-nourished. No distress. HEENT: Normocephalic and atraumatic. Oropharynx is clear and moist. No oropharyngeal exudate. Conjunctivae are normal.  No scleral icterus. Cardiovascular: Normal rate, regular rhythm and intact distal pulses.  Pulmonary/chest: Effort normal and breath sounds normal. No wheezing, rales or rhonchi. Abdominal: Soft, nontender, nondistended. Bowel sounds active throughout. There are no masses palpable. No hepatosplenomegaly. Extremities: no clubbing, cyanosis, or edema Neurological: Alert and  oriented to person place and time. Skin: Skin is warm and dry. No rashes noted. Psychiatric: Normal mood and affect. Behavior is normal.  CBC    Component Value Date/Time   WBC 11.1* 10/20/2013 1137   RBC 4.55 10/20/2013 1137   HGB 11.8* 10/20/2013 1137   HCT 37.5* 10/20/2013 1137   PLT 264.0 10/20/2013 1137   MCV 82.3 10/20/2013 1137   MCH 27.5 08/13/2013 1330   MCHC 31.6 10/20/2013 1137   RDW 16.0* 10/20/2013 1137   LYMPHSABS 2.7 08/13/2013 1330   MONOABS 0.7 08/13/2013 1330   EOSABS 0.2 08/13/2013 1330   BASOSABS 0.0 08/13/2013 1330    Iron/TIBC/Ferritin    Component Value Date/Time   IRON 33* 10/20/2013 1137   FERRITIN 13.6* 10/20/2013 1137       Assessment & Plan:  73 year old male with a past medical history of Charcot-Marie-Tooth disease, deafness, hypertension, hyperlipidemia, diabetes, arthritis, and gastric ulcers with iron deficiency anemia who is seen for followup.  1.  Gastric ulcers/IDA -- hopefully his iron deficiency anemia has improved further. His hemoglobin improved as of February 2015 but had not normalized. We will recheck CBC and iron studies today. He has remained on daily iron supplement and is trying to be an iron rich diet. Gastric ulcers is felt to explain his iron deficiency anemia, which have now been treated. This was H. pylori negative. I asked that he continued to avoid NSAIDs if possible.  2.  CRC screening/incomplete colonoscopy -- he had an incomplete colonoscopy due to tortuosity, and the cecum was incompletely visualized. He had moderate diverticulosis. Given his incomplete colonoscopy, while I feel it is low risk for a missed lesion of any significance, I recommended an additional screening test  with Cologuard.  This test was discussed, he is agreeable to proceed, and it will be mailed to his home.  Await labs, follow-up as needed.  He will return to PCP

## 2013-12-25 ENCOUNTER — Other Ambulatory Visit: Payer: Self-pay

## 2013-12-25 DIAGNOSIS — D649 Anemia, unspecified: Secondary | ICD-10-CM

## 2013-12-30 ENCOUNTER — Telehealth: Payer: Self-pay | Admitting: Internal Medicine

## 2013-12-30 NOTE — Telephone Encounter (Signed)
Left message for patient to call back  

## 2013-12-31 ENCOUNTER — Telehealth: Payer: Self-pay | Admitting: Internal Medicine

## 2013-12-31 NOTE — Telephone Encounter (Signed)
I have explained to the patient that the procedure was incomplete and why cologuard was ordered.  He verbalized understanding and will complete the test when he gets it in the mail.  We have refaxed the order to [facility].  He is not sure, but may have declined the test with the company

## 2013-12-31 NOTE — Telephone Encounter (Signed)
I have explained to the patient that he will need to contact his primary care MD about his actos.

## 2014-01-05 ENCOUNTER — Telehealth: Payer: Self-pay | Admitting: Interventional Cardiology

## 2014-01-05 NOTE — Telephone Encounter (Signed)
Called pt to let him know that his wife has an appt on Thursday not him.

## 2014-01-05 NOTE — Telephone Encounter (Signed)
New message    Patient calling stating he has an appt on Thursday . Please advise.

## 2014-01-08 ENCOUNTER — Other Ambulatory Visit: Payer: Self-pay | Admitting: Cardiology

## 2014-01-08 MED ORDER — NITROGLYCERIN 0.4 MG SL SUBL: 0.4000 mg | SUBLINGUAL_TABLET | SUBLINGUAL | Status: DC | PRN

## 2014-01-19 ENCOUNTER — Other Ambulatory Visit: Payer: Self-pay | Admitting: Internal Medicine

## 2014-01-21 ENCOUNTER — Other Ambulatory Visit: Payer: Self-pay | Admitting: Gastroenterology

## 2014-01-22 ENCOUNTER — Other Ambulatory Visit: Payer: Self-pay | Admitting: Gastroenterology

## 2014-01-22 ENCOUNTER — Other Ambulatory Visit: Payer: Self-pay | Admitting: Internal Medicine

## 2014-02-24 ENCOUNTER — Other Ambulatory Visit: Payer: Self-pay | Admitting: Internal Medicine

## 2014-02-27 ENCOUNTER — Other Ambulatory Visit: Payer: Self-pay | Admitting: *Deleted

## 2014-02-27 MED ORDER — TRAMADOL HCL 50 MG PO TABS: ORAL_TABLET | ORAL | Status: DC

## 2014-02-27 NOTE — Telephone Encounter (Signed)
Called the patient using the deaf service that answered.  I asked if he was completely out of the Tramadol 50 mg and he said yes.  I advised in Dr. Vena Rua abscence, Tye Savoy NP-C agreed to do a refill for # 30 only.  He will have to call our office for further refills from Dr. Hilarie Fredrickson.

## 2014-03-02 ENCOUNTER — Other Ambulatory Visit: Payer: Self-pay | Admitting: *Deleted

## 2014-03-10 ENCOUNTER — Other Ambulatory Visit: Payer: Self-pay | Admitting: Nurse Practitioner

## 2014-03-30 ENCOUNTER — Telehealth: Payer: Self-pay | Admitting: *Deleted

## 2014-03-30 MED ORDER — TRAMADOL HCL 50 MG PO TABS: ORAL_TABLET | ORAL | Status: DC

## 2014-03-30 NOTE — Telephone Encounter (Signed)
Dr Hilarie Fredrickson- Patient requests refills on tramadol 50 mg. It appears that this was originally given to him on 09/09/13 for joint pain. We were trying to avoid him taking NSAIDs due to his history of gastric and duodenal ulcers. He has got #100 on 09/09/13, #100 on 01/21/14 and #30 on 02/27/14, so he is not taking a ton of it, but just want to make sure you are okay with continuing to prescribe this rx for him.Marland KitchenMarland KitchenMarland Kitchen

## 2014-03-30 NOTE — Telephone Encounter (Signed)
I have spoken to patient (with help of a sign language interpreter) to advise that we have sent an additional script of tramadol to his pharmacy. However, Dr Hilarie Fredrickson would like for him to speak with his PCP or orthopedic specialist to see if there are any better options for him than tramadol for pain. Patient does not understand why Dr Hilarie Fredrickson told him he couldn't take the medication he was originally on for shoulder pain. I explained that due to his history of gastric ulcers, he should not take NSAIDs as they can cause worsening problems with ulcers and bleeding. He tells me he has never had an ulcer. I explained that his last endoscopy actually did show ulcerations. He also has anemia which indicates he could have some bleeding due to ulcers etc related to the medication he was taking. Patient verbalizes understanding and states that he is going to a pain clinic this week and will discuss options with them.

## 2014-03-30 NOTE — Telephone Encounter (Signed)
Thank you, okay to refill.  Please advise him to ask his PCP or orthopedic for another or potentially better option for chronic joint pain

## 2014-04-03 ENCOUNTER — Telehealth: Payer: Self-pay | Admitting: Internal Medicine

## 2014-04-03 NOTE — Telephone Encounter (Signed)
I have left a message that unfortunately, we are unable to give hydrocodone in place of Tramadol. He will need to get pain medications for his joint pain from a PCP or orthopedist as recommended by Dr Hilarie Fredrickson. Dr Hilarie Fredrickson is also out of the office for 10 days. I have advised that patient may call back with questions.

## 2014-05-06 ENCOUNTER — Ambulatory Visit (INDEPENDENT_AMBULATORY_CARE_PROVIDER_SITE_OTHER): Payer: Medicare Other | Admitting: Interventional Cardiology

## 2014-05-06 ENCOUNTER — Encounter: Payer: Self-pay | Admitting: Interventional Cardiology

## 2014-05-06 VITALS — BP 132/82 | HR 50 | Ht 69.0 in | Wt 245.8 lb

## 2014-05-06 DIAGNOSIS — K253 Acute gastric ulcer without hemorrhage or perforation: Secondary | ICD-10-CM

## 2014-05-06 DIAGNOSIS — D649 Anemia, unspecified: Secondary | ICD-10-CM | POA: Insufficient documentation

## 2014-05-06 DIAGNOSIS — I10 Essential (primary) hypertension: Secondary | ICD-10-CM

## 2014-05-06 DIAGNOSIS — R0602 Shortness of breath: Secondary | ICD-10-CM

## 2014-05-06 DIAGNOSIS — I209 Angina pectoris, unspecified: Secondary | ICD-10-CM

## 2014-05-06 LAB — BASIC METABOLIC PANEL
BUN: 23 mg/dL (ref 6–23)
CO2: 23 mEq/L (ref 19–32)
Calcium: 8.9 mg/dL (ref 8.4–10.5)
Chloride: 106 mEq/L (ref 96–112)
Creatinine, Ser: 1.2 mg/dL (ref 0.4–1.5)
GFR: 64.08 mL/min (ref >60.00)
GLUCOSE: 89 mg/dL (ref 70–99)
Potassium: 5.1 mEq/L (ref 3.5–5.1)
SODIUM: 138 meq/L (ref 135–145)

## 2014-05-06 LAB — CBC
HCT: 38.1 % — ABNORMAL LOW (ref 39.0–52.0)
Hemoglobin: 12.6 g/dL — ABNORMAL LOW (ref 13.0–17.0)
MCHC: 32.9 g/dL (ref 30.0–36.0)
MCV: 85.9 fl (ref 78.0–100.0)
Platelets: 252 10*3/uL (ref 150.0–400.0)
RBC: 4.44 Mil/uL (ref 4.22–5.81)
RDW: 14.8 % (ref 11.5–15.5)
WBC: 7.6 10*3/uL (ref 4.0–10.5)

## 2014-05-06 NOTE — Progress Notes (Signed)
Patient ID: RAMAJ FRANGOS, male   DOB: 10/22/1940, 73 y.o.   MRN: [id number] Patient ID: COPE MARTE, male   DOB: Feb 01, 1941, 73 y.o.   MRN: [id number]    [place], [place]  [address] Phone: [phone] Fax:  [phone]  Date:  05/06/2014   ID:  Emelio, Schneller 1940/11/03, MRN [id number]  PCP:  Wenda Low, MD      History of Present Illness: KINSLER SOEDER is a 73 y.o. male who had angina in 2014. He was scheduled for cardiac catheterization. During the workup for cath, he was found to be anemic. He underwent a GI evaluation. He had Hemoccult positive stool. He underwent EGD which showed ulcers but there was no obvious source of bleeding noted. His hemoglobin has increased with iron supplementation.  Since his blood count has been back in the normal range, he has not had any exertional chest discomfort. He continues to walk. His wife reports that he is not walking as much as he use to. He has not had a use any nitroglycerin under his tongue. Overall, he is feeling better.   Wt Readings from Last 3 Encounters:  05/06/14 245 lb 12.8 oz (111.494 kg)  12/24/13 243 lb (110.224 kg)  10/20/13 227 lb 4 oz (103.08 kg)     Past Medical History  Diagnosis Date  . Arthritis   . DM (diabetes mellitus)   . HTN (hypertension)   . HLD (hyperlipidemia)   . Overactive bladder   . CMT (Charcot-Marie-Tooth disease)   . Ulcer     Current Outpatient Prescriptions  Medication Sig Dispense Refill  . amLODipine-benazepril (LOTREL) 5-20 MG per capsule Take 1 capsule by mouth daily.      . Cholecalciferol (VITAMIN D) 1000 UNITS capsule Take 1,000 Units by mouth 3 (three) times a week. Monday, Wednesday, Saturday      . cyclobenzaprine (FLEXERIL) 10 MG tablet       . fexofenadine (ALLEGRA) 180 MG tablet Take 180 mg by mouth daily as needed for allergies.       . GuaiFENesin (MUCINEX PO) Take 1 tablet by mouth daily as needed (to loosen phlegm).      .  insulin glargine (LANTUS) 100 units/mL SOLN Inject 30 Units into the skin at bedtime.      . isosorbide mononitrate (IMDUR) 30 MG 24 hr tablet Taking one to two tablets daily      . LORazepam (ATIVAN) 1 MG tablet Take 1 mg by mouth 2 (two) times daily.       . metFORMIN (GLUCOPHAGE) 1000 MG tablet Take 1,000 mg by mouth 2 (two) times daily with a meal.      . nitroGLYCERIN (NITROSTAT) 0.4 MG SL tablet Place 1 tablet (0.4 mg total) under the tongue every 5 (five) minutes as needed for chest pain.  25 tablet  5  . oxybutynin (DITROPAN-XL) 10 MG 24 hr tablet Take 10 mg by mouth daily as needed (for overactive bladder.).      Marland Kitchen oxyCODONE-acetaminophen (PERCOCET) 10-325 MG per tablet Take 1 tablet by mouth as needed for pain.      . pantoprazole (PROTONIX) 40 MG tablet Take 1 tablet (40 mg total) by mouth daily.  90 tablet  3  . pioglitazone (ACTOS) 30 MG tablet Take 30 mg by mouth daily.      . polyethylene glycol (MIRALAX / GLYCOLAX) packet Take 17 g by mouth 2 (two) times daily as needed (constipation).       Marland Kitchen  rosuvastatin (CRESTOR) 10 MG tablet Take 10 mg by mouth at bedtime.       . traMADol (ULTRAM) 50 MG tablet Take 1-2 tablet up to 3 times daly as needed for pain  100 tablet  0   No current facility-administered medications for this visit.    Allergies:   No Known Allergies  Social History:  The patient  reports that he has never smoked. He has never used smokeless tobacco. He reports that he does not drink alcohol or use illicit drugs.   Family History:  The patient's family history includes Breast cancer in his sister; Cervical cancer in his mother and sister; Diabetes in his brother and sister; Heart disease in his sister.   ROS:  Please see the history of present illness.  No nausea, vomiting.  No fevers, chills.  No focal weakness.  No dysuria.    All other systems reviewed and negative.   PHYSICAL EXAM: VS:  BP 132/82  Pulse 50  Ht 5\' 9"  (1.753 m)  Wt 245 lb 12.8 oz (111.494 kg)   BMI 36.28 kg/m2 Well nourished, well developed, in no acute distress HEENT: normal Neck: no JVD, no carotid bruits Cardiac:  normal S1, S2; RRR;  Lungs:  clear to auscultation bilaterally, no wheezing, rhonchi or rales Abd: soft, nontender, no hepatomegaly Ext: Bilateral pitting edema-improved, left leg brace Skin: warm and dry Neuro:   no focal abnormalities noted     ASSESSMENT AND PLAN:  1. Angina: We discussed cardiac cath again. Since his symptoms have improved, we'll hold off. He has increased his activity level.He has used NTG several times since the last visit. He will contact us if symptoms worsen.  WIll check echo to evaluate LV function. I will see him back in 2 months.  2.  Hypertension: Controlled today. Continue current antihypertensive medicines. He is also on amlodipine which should help reduce angina.  3. Ulcers: Would need to be sure that he is ok for dual antiplatelet therapy prior to a cath.  Check CBC given persistent anemia, Hbg 11.6 in 4/15.  Continue iron supplementation.    History obtained with a sign language interpreter.   Signed, Mina Marble, MD, [facility] 05/06/2014 9:42 AM

## 2014-05-06 NOTE — Patient Instructions (Addendum)
Your physician recommends that you continue on your current medications as directed. Please refer to the Current Medication list given to you today.  Your physician recommends that you return for lab work today for [place] and B[place].   Your physician has requested that you have an echocardiogram. Echocardiography is a painless test that uses sound waves to create images of your heart. It provides your doctor with information about the size and shape of your heart and how well your heart's chambers and valves are working. This procedure takes approximately one hour. There are no restrictions for this procedure.  Your physician recommends that you schedule a follow-up appointment in 2 months with Dr. Irish Lack.

## 2014-05-07 ENCOUNTER — Other Ambulatory Visit: Payer: Self-pay | Admitting: Internal Medicine

## 2014-05-07 ENCOUNTER — Other Ambulatory Visit: Payer: Self-pay | Admitting: Nurse Practitioner

## 2014-05-07 DIAGNOSIS — R599 Enlarged lymph nodes, unspecified: Secondary | ICD-10-CM

## 2014-05-08 ENCOUNTER — Telehealth: Payer: Self-pay | Admitting: Interventional Cardiology

## 2014-05-08 NOTE — Telephone Encounter (Signed)
Spoke w/ pt via [employer].  States he has been having some chest tightness and congestion today.  Drinking a lot of water. Has a cough that is productive in AM's and is having some SOB when walks. No temp. No CP.  Advised for him to see PCP.  States he saw PCP yesterday for a pain in his neck.  Did not mention to him about the congestion.  Advised for him to call PCP back and advise him of the chest congestion. He will call his PCP and follow up with him.

## 2014-05-08 NOTE — Telephone Encounter (Signed)
New message    Want to make dr aware pt had chest pain this am--took a nitro.  He is fine now--having echo on Monday.  Going to [employer] now--please call after 2pm

## 2014-05-11 ENCOUNTER — Ambulatory Visit (HOSPITAL_COMMUNITY): Payer: Medicare Other | Attending: Cardiovascular Disease | Admitting: Cardiology

## 2014-05-11 ENCOUNTER — Telehealth: Payer: Self-pay | Admitting: Interventional Cardiology

## 2014-05-11 DIAGNOSIS — E119 Type 2 diabetes mellitus without complications: Secondary | ICD-10-CM | POA: Insufficient documentation

## 2014-05-11 DIAGNOSIS — E785 Hyperlipidemia, unspecified: Secondary | ICD-10-CM | POA: Diagnosis not present

## 2014-05-11 DIAGNOSIS — R0602 Shortness of breath: Secondary | ICD-10-CM | POA: Diagnosis not present

## 2014-05-11 DIAGNOSIS — I10 Essential (primary) hypertension: Secondary | ICD-10-CM | POA: Diagnosis not present

## 2014-05-11 NOTE — Progress Notes (Signed)
Echo performed. 

## 2014-05-11 NOTE — Telephone Encounter (Signed)
Pt here for echo with wife. Wife told registation personnel about episode of pain this AM as outlined below. I was called to evaluate. Pt, wife and sign language interpreter taken to exam room.  Pt reports he is feeling fine now.  No chest pain or shortness of breath. Through interpreter pt described pain this AM while bathing. This was worse when moving his arms to wash. Pain across chest and to both shoulders. Pain worse on right side. Felt very flushed with this. Felt short of breath--describes as feeling like he was underwater. He continued bathing and pain went away after about an hour. He did take tramadol after his bath. Per phone note on May 08, 2014 pt with recent congestion. He states congestion has improved. He reports pain this AM different than what he felt on Friday. Pt is feeling fine now and was taken to echo room.  Will forward to Dr. Irish Lack for recommendations.

## 2014-05-11 NOTE — Telephone Encounter (Signed)
Pt arms were hurting, different kind of pain then usually. Took a bath today and was having hard time breathing and wife ( was taking a nap) woke up from him having those pains in chest area. Wife is scared she would have taken him to ER but knew he was coming for Echo. When water came on body he felt like it was shocking.

## 2014-05-12 ENCOUNTER — Ambulatory Visit
Admission: RE | Admit: 2014-05-12 | Discharge: 2014-05-12 | Disposition: A | Discharge status: Home / Self Care | Payer: Medicare Other | Source: Ambulatory Visit | Attending: Internal Medicine | Admitting: Internal Medicine

## 2014-05-12 DIAGNOSIS — R599 Enlarged lymph nodes, unspecified: Secondary | ICD-10-CM

## 2014-05-13 NOTE — Telephone Encounter (Signed)
Spoke with pt today and gave him echo and lab results. Pt states he is doing fine without CP or SOB today.

## 2014-07-08 ENCOUNTER — Encounter: Payer: Self-pay | Admitting: Interventional Cardiology

## 2014-07-08 ENCOUNTER — Ambulatory Visit (INDEPENDENT_AMBULATORY_CARE_PROVIDER_SITE_OTHER): Payer: Medicare Other | Admitting: Interventional Cardiology

## 2014-07-08 VITALS — BP 120/76 | HR 89 | Ht 70.0 in | Wt 227.0 lb

## 2014-07-08 DIAGNOSIS — I209 Angina pectoris, unspecified: Secondary | ICD-10-CM

## 2014-07-08 DIAGNOSIS — E782 Mixed hyperlipidemia: Secondary | ICD-10-CM

## 2014-07-08 DIAGNOSIS — I10 Essential (primary) hypertension: Secondary | ICD-10-CM

## 2014-07-08 NOTE — Patient Instructions (Signed)
Your physician wants you to follow-up in: 6 months with Dr. Varanasi. You will receive a reminder letter in the mail two months in advance. If you don't receive a letter, please call our office to schedule the follow-up appointment.  Your physician recommends that you continue on your current medications as directed. Please refer to the Current Medication list given to you today.  

## 2014-07-08 NOTE — Progress Notes (Signed)
Patient ID: Christian [name], male   DOB: 06/25/1941, 73 y.o.   MRN: [id number]    [place], [address], [place]  [address] Phone: [phone] Fax:  [phone]  Date:  07/08/2014   ID:  Christian, [name] 1941/01/30, MRN [id number]  PCP:  Christian Low, MD      History of Present Illness: Christian [name] is a 73 y.o. male who had angina in 2014. He was scheduled for cardiac catheterization. During the workup for cath, he was found to be anemic. He underwent a GI evaluation. He had Hemoccult positive stool. He underwent EGD which showed ulcers but there was no obvious source of bleeding noted. His hemoglobin has increased with iron supplementation.  Since his blood count has been back in the normal range, he has not had any exertional chest discomfort. He continues to walk. His wife reports that he is not walking as much as he use to. He has not had a use any nitroglycerin under his tongue. Overall, he is feeling better.  [facility] did go [facility] few weeks [facility].  He was diagnosed with anxiety.  He felt that he was drowning.  He felt nervous and could not sleep.  Sx are much better on Anxiety meds.  No chest pain since that time.  He remains active working in the yard.  He does not want to pursue cath at this time.    Wt Readings from Last 3 Encounters:  07/08/14 227 lb (102.967 kg)  05/06/14 245 lb 12.8 oz (111.494 kg)  12/24/13 243 lb (110.224 kg)     Past Medical History  Diagnosis Date  . Arthritis   . DM (diabetes mellitus)   . HTN (hypertension)   . HLD (hyperlipidemia)   . Overactive bladder   . CMT (Charcot-Marie-Tooth disease)   . Ulcer     Current Outpatient Prescriptions  Medication Sig Dispense Refill  . amLODipine-benazepril (LOTREL) 5-20 MG per capsule Take 1 capsule by mouth daily.      . Cholecalciferol (VITAMIN D) 1000 UNITS capsule Take 1,000 Units by mouth 3 (three) times a week. Monday, Wednesday, Saturday      . cyclobenzaprine  (FLEXERIL) 10 MG tablet       . fexofenadine (ALLEGRA) 180 MG tablet Take 180 mg by mouth daily as needed for allergies.       . GuaiFENesin (MUCINEX PO) Take 1 tablet by mouth daily as needed (to loosen phlegm).      . insulin glargine (LANTUS) 100 units/mL SOLN Inject 30 Units into the skin at bedtime.      . isosorbide mononitrate (IMDUR) 30 MG 24 hr tablet Taking one to two tablets daily      . LORazepam (ATIVAN) 1 MG tablet Take 1 mg by mouth 2 (two) times daily.       . metFORMIN (GLUCOPHAGE) 1000 MG tablet Take 1,000 mg by mouth 2 (two) times daily with a meal.      . nitroGLYCERIN (NITROSTAT) 0.4 MG SL tablet Place 1 tablet (0.4 mg total) under the tongue every 5 (five) minutes as needed for chest pain.  25 tablet  5  . oxybutynin (DITROPAN-XL) 10 MG 24 hr tablet Take 10 mg by mouth daily as needed (for overactive bladder.).      Marland Kitchen oxyCODONE-acetaminophen (PERCOCET) 10-325 MG per tablet Take 1 tablet by mouth as needed for pain.      . pantoprazole (PROTONIX) 40 MG tablet Take 1  tablet (40 mg total) by mouth daily.  90 tablet  3  . pioglitazone (ACTOS) 30 MG tablet Take 30 mg by mouth daily.      . polyethylene glycol (MIRALAX / GLYCOLAX) packet Take 17 g by mouth 2 (two) times daily as needed (constipation).       . rosuvastatin (CRESTOR) 10 MG tablet Take 10 mg by mouth at bedtime.       . traMADol (ULTRAM) 50 MG tablet Take 1-2 tablet up to 3 times daly as needed for pain  100 tablet  0   No current facility-administered medications for this visit.    Allergies:   No Known Allergies  Social History:  The patient  reports that he has never smoked. He has never used smokeless tobacco. He reports that he does not drink alcohol or use illicit drugs.   Family History:  The patient's family history includes Breast cancer in his sister; Cervical cancer in his mother and sister; Diabetes in his brother and sister; Heart disease in his sister.   ROS:  Please see the history of present  illness.  No nausea, vomiting.  No fevers, chills.  No focal weakness.  No dysuria.    All other systems reviewed and negative.   PHYSICAL EXAM: VS:  BP 120/76  Pulse 89  Ht 5\' 10"  (1.778 m)  Wt 227 lb (102.967 kg)  BMI 32.57 kg/m2 Well nourished, well developed, in no acute distress HEENT: normal Neck: no JVD, no carotid bruits Cardiac:  normal S1, S2; RRR;  Lungs:  clear to auscultation bilaterally, no wheezing, rhonchi or rales Abd: soft, nontender, no hepatomegaly Ext: Bilateral pitting edema-improved, left leg brace Skin: warm and dry Neuro:   no focal abnormalities noted     ASSESSMENT AND PLAN:  1. Angina: We discussed cardiac cath again. Since his symptoms have improved, we'll hold off. He has increased his activity level.  No recent NTG use. He will contact us if symptoms worsen.  Sx better with higher Hbg. 2.  Hypertension: Controlled today. Continue current antihypertensive medicines. He is also on amlodipine which should help reduce angina.  3. Ulcers: Would need to be sure that he is ok for dual antiplatelet therapy prior to a cath if this was ever needed.  H/o anemia.  History obtained with a sign language interpreter.   Signed, Christian Marble, MD, [facility] 07/08/2014 3:05 PM

## 2014-09-25 ENCOUNTER — Telehealth: Payer: Self-pay | Admitting: Internal Medicine

## 2014-09-25 NOTE — Telephone Encounter (Signed)
No answer

## 2014-09-30 NOTE — Telephone Encounter (Signed)
Called pt 09/25/14 but there was no answer.  Called pt and left message for pt to call back 09/30/14 @11 :16am.  Pt has been taking pantoprazole, has history of stomach ulcers. Pt wants to know how long he needs to continue taking the pantoprazole. Please advise.

## 2014-10-06 NOTE — Telephone Encounter (Signed)
Pt aware.

## 2014-10-06 NOTE — Telephone Encounter (Signed)
Would continue daily PPI if he remains on NSAIDs of any type given history of ulcers otherwise PPI could be discontinued Blood counts should be monitored by his primary care provider

## 2014-10-08 ENCOUNTER — Other Ambulatory Visit: Payer: Self-pay | Admitting: Internal Medicine

## 2014-10-12 ENCOUNTER — Other Ambulatory Visit: Payer: Self-pay | Admitting: Internal Medicine

## 2015-01-28 ENCOUNTER — Ambulatory Visit (INDEPENDENT_AMBULATORY_CARE_PROVIDER_SITE_OTHER): Payer: Medicare Other | Admitting: Interventional Cardiology

## 2015-01-28 ENCOUNTER — Encounter: Payer: Self-pay | Admitting: Interventional Cardiology

## 2015-01-28 VITALS — BP 130/70 | HR 82 | Ht 70.0 in | Wt 226.0 lb

## 2015-01-28 DIAGNOSIS — I209 Angina pectoris, unspecified: Secondary | ICD-10-CM | POA: Diagnosis not present

## 2015-01-28 DIAGNOSIS — I10 Essential (primary) hypertension: Secondary | ICD-10-CM | POA: Diagnosis not present

## 2015-01-28 DIAGNOSIS — R600 Localized edema: Secondary | ICD-10-CM | POA: Diagnosis not present

## 2015-01-28 NOTE — Patient Instructions (Signed)

## 2015-01-28 NOTE — Progress Notes (Signed)
Patient ID: Christian [name], male   DOB: 03/20/1941, 74 y.o.   MRN: [id number]     Cardiology Office Note   Date:  01/28/2015   ID:  Christian [name], DOB 1940/11/05, MRN [id number]  PCP:  Wenda Low, MD    No chief complaint on file. RF for CAD   Wt Readings from Last 3 Encounters:  01/28/15 226 lb (102.513 kg)  07/08/14 227 lb (102.967 kg)  05/06/14 245 lb 12.8 oz (111.494 kg)       History of Present Illness: Christian [name] is a 74 y.o. male   who had angina in 2014. He was scheduled for cardiac catheterization. During the workup for cath, he was found to be anemic. He underwent a GI evaluation. He had Hemoccult positive stool. He underwent EGD which showed ulcers but there was no obvious source of bleeding noted. His hemoglobin has increased with iron supplementation.  Since his blood count has been back in the normal range, he has not had any exertional chest discomfort. He continues to walk. His wife reports that he is back to regular walking, as much as he use to. He has not had a use any nitroglycerin under his tongue. Overall, he is feeling better.     Past Medical History  Diagnosis Date  . Arthritis   . DM (diabetes mellitus)   . HTN (hypertension)   . HLD (hyperlipidemia)   . Overactive bladder   . CMT (Charcot-Marie-Tooth disease)   . Ulcer     Past Surgical History  Procedure Laterality Date  . Total knee arthroplasty Bilateral 1999  . Total hip arthroplasty Bilateral 2002, 2003  . Foot surgery      age 20  . Foot amputation through metatarsal Left   . Esophagogastroduodenoscopy N/A 08/27/2013    Procedure: ESOPHAGOGASTRODUODENOSCOPY (EGD);  Surgeon: Jerene Bears, MD;  Location: [facility];  Service: Gastroenterology;  Laterality: N/A;  . Colonoscopy N/A 08/27/2013    Procedure: COLONOSCOPY;  Surgeon: Jerene Bears, MD;  Location: [facility];  Service: Gastroenterology;  Laterality: N/A;     Current Outpatient Prescriptions  Medication Sig  Dispense Refill  . amLODipine-benazepril (LOTREL) 5-20 MG per capsule Take 1 capsule by mouth daily.    . Cholecalciferol (VITAMIN D) 1000 UNITS capsule Take 1,000 Units by mouth 3 (three) times a week. Monday, Wednesday, Saturday    . cyclobenzaprine (FLEXERIL) 10 MG tablet Take 10 mg by mouth 3 (three) times daily as needed for muscle spasms.     . fexofenadine (ALLEGRA) 180 MG tablet Take 180 mg by mouth daily as needed for allergies.     . GuaiFENesin (MUCINEX PO) Take 1 tablet by mouth daily as needed (to loosen phlegm).    . insulin glargine (LANTUS) 100 units/mL SOLN Inject 30 Units into the skin at bedtime.    . isosorbide mononitrate (IMDUR) 30 MG 24 hr tablet Taking one to two tablets daily    . LORazepam (ATIVAN) 1 MG tablet Take 1 mg by mouth 2 (two) times daily.     Marland Kitchen LORazepam (ATIVAN) 2 MG tablet Take 1 mg by mouth every 6 (six) hours as needed for anxiety. Take one half tablet by mouth in the morning and one half tablet by mouth in the evening    . metFORMIN (GLUCOPHAGE) 1000 MG tablet Take 1,000 mg by mouth 2 (two) times daily with a meal.    . nitroGLYCERIN (NITROSTAT) 0.4 MG SL tablet Place 1 tablet (0.4 mg total) under  the tongue every 5 (five) minutes as needed for chest pain. 25 tablet 5  . oxybutynin (DITROPAN-XL) 10 MG 24 hr tablet Take 10 mg by mouth daily as needed (for overactive bladder.). Take one tablet by mouth twice daily as needed for overactive bladder    . oxyCODONE-acetaminophen (PERCOCET) 10-325 MG per tablet Take 1 tablet by mouth as needed for pain.    . pantoprazole (PROTONIX) 40 MG tablet Take 1 tablet (40 mg total) by mouth daily. 90 tablet 3  . pioglitazone (ACTOS) 30 MG tablet Take 30 mg by mouth daily.    . polyethylene glycol (MIRALAX / GLYCOLAX) packet Take 17 g by mouth 2 (two) times daily as needed (constipation).     . rosuvastatin (CRESTOR) 10 MG tablet Take 10 mg by mouth at bedtime.     . traMADol (ULTRAM) 50 MG tablet Take 1-2 tablet up to 3  times daly as needed for pain 100 tablet 0   No current facility-administered medications for this visit.    Allergies:   Review of patient's allergies indicates no known allergies.    Social History:  The patient  reports that he has never smoked. He has never used smokeless tobacco. He reports that he does not drink alcohol or use illicit drugs.   Family History:  The patient's *family history includes Breast cancer in his sister; Cervical cancer in his mother and sister; Diabetes in his brother and sister; Heart disease in his sister.    ROS:  Please see the history of present illness.   Otherwise, review of systems are positive for lower extremity swelling*.   All other systems are reviewed and negative.    PHYSICAL EXAM: VS:  BP 130/70 mmHg  Pulse 82  Ht 5\' 10"  (1.778 m)  Wt 226 lb (102.513 kg)  BMI 32.43 kg/m2 , BMI Body mass index is 32.43 kg/(m^2). GEN: Well nourished, well developed, in no acute distress HEENT: normal Neck: no JVD, carotid bruits, or masses Cardiac: RRR; no murmurs, rubs, or gallops,no edema  Respiratory:  clear to auscultation bilaterally, normal work of breathing GI: soft, nontender, nondistended, + BS MS: no deformity or atrophy Skin: warm and dry, no rash Neuro:  Strength and sensation are intact Psych: euthymic mood, full affect   EKG:   The ekg ordered today demonstrates NSR. NSST   Recent Labs: 05/06/2014: BUN 23; Creatinine 1.2; Hemoglobin 12.6*; Platelets 252.0; Potassium 5.1; Sodium 138   Lipid Panel No results found for: CHOL, TRIG, HDL, CHOLHDL, VLDL, LDLCALC, LDLDIRECT   Other studies Reviewed: Additional studies/ records that were reviewed today with results demonstrating: .   ASSESSMENT AND PLAN:  1. Angina: Since his symptoms have improved, we'll hold off. He has increased his activity level. No recent NTG use. He will contact us if symptoms worsen. Sx better with higher Hbg. 2.  Hypertension: Controlled today. Continue  current antihypertensive medicines. He is also on amlodipine which should help reduce angina.  3. Ulcers: Would need to be sure that he is ok for dual antiplatelet therapy prior to a cath if this was ever needed. H/o anemia.  4. Lower extremity edema: Bilateral. Recommended elevating his legs whenever possible.  History obtained with a sign language interpreter.   Current medicines are reviewed at length with the patient today.  The patient concerns regarding his medicines were addressed.  The following changes have been made:  No change  Labs/ tests ordered today include:  No orders of the defined types were placed  in this encounter.    Recommend 150 minutes/week of aerobic exercise Low fat, low carb, high fiber diet recommended  Disposition:   FU in 1 yr   Teresita Madura., MD  01/28/2015 12:05 [facility] [address], [place], [place]  [address] Phone: [phone]; Fax: [phone]

## 2015-01-30 ENCOUNTER — Other Ambulatory Visit: Payer: Self-pay | Admitting: Internal Medicine

## 2015-08-10 ENCOUNTER — Other Ambulatory Visit: Payer: Self-pay | Admitting: Interventional Cardiology

## 2015-09-03 ENCOUNTER — Other Ambulatory Visit: Payer: Self-pay | Admitting: Interventional Cardiology

## 2016-01-06 ENCOUNTER — Ambulatory Visit: Payer: Medicare Other | Admitting: Physical Therapy

## 2016-07-19 ENCOUNTER — Other Ambulatory Visit: Payer: Self-pay | Admitting: Dermatology

## 2016-07-19 ENCOUNTER — Other Ambulatory Visit: Payer: Self-pay | Admitting: Interventional Cardiology

## 2016-07-20 ENCOUNTER — Telehealth (INDEPENDENT_AMBULATORY_CARE_PROVIDER_SITE_OTHER): Payer: Self-pay | Admitting: *Deleted

## 2016-07-20 NOTE — Telephone Encounter (Signed)
Pt. Called stat ing the tramadol is not working and is needing something different for pain. Best call back number is [phone].

## 2016-07-21 NOTE — Telephone Encounter (Signed)
Called and lm for pt through service to advise that the pt needs to call the office and make an appt for eval so we can assess what is going

## 2016-07-24 ENCOUNTER — Ambulatory Visit (INDEPENDENT_AMBULATORY_CARE_PROVIDER_SITE_OTHER): Payer: Medicare Other | Admitting: Family

## 2016-07-24 ENCOUNTER — Encounter (INDEPENDENT_AMBULATORY_CARE_PROVIDER_SITE_OTHER): Payer: Self-pay | Admitting: Orthopedic Surgery

## 2016-07-24 VITALS — Ht 70.0 in | Wt 226.0 lb

## 2016-07-24 DIAGNOSIS — M5442 Lumbago with sciatica, left side: Secondary | ICD-10-CM

## 2016-07-24 DIAGNOSIS — G8929 Other chronic pain: Secondary | ICD-10-CM | POA: Diagnosis not present

## 2016-07-24 NOTE — Progress Notes (Signed)
Office Visit Note   Patient: Christian [name]           Date of Birth: [birth year]           MRN: [id number] Visit Date: 07/24/2016              Requested by: Wenda Low, MD [address] [place], [place] [address] PCP: Wenda Low, MD   Assessment & Plan: Visit Diagnoses: No diagnosis found.  Plan: Declined prednisone today. We will have him get an with Dr. Ernestina Patches for [facility] of the lumbar spine.  Follow-Up Instructions: No Follow-up on file.   Orders:  No orders of the defined types were placed in this encounter.  No orders of the defined types were placed in this encounter.     Procedures: No procedures performed   Clinical Data: No additional findings.   Subjective: Chief Complaint  Patient presents with  . Lower Back - Pain    Patient is here to discuss pain management. He does not want to take Vicodin 5/325 or tramadol 50 mg because " it doesn't work" Pt states that he needs Percocet 10/325 mg and that this is the only thing that works. He states that the back pain left sided tightness and numbness that is radicular down the left side. He wants to work and states that he is not able to do this because he is in so much pain. That he is having balance loss and is suing a cane for ambulation because of the instabiity due to the pain.  Requesting more percocet today for low back pain. Reports increased low back pain with standing.  States no relief from different muscle relaxers. States has tried Prednisone in the past for low back pain. This was not helpful. Does report historical ESI which was helpful, many years ago. Is interested in trying this again.  No recent injury.  Last MRI about a year ago.  Review of Systems  Constitutional: Negative for chills and fever.  Musculoskeletal: Positive for back pain and gait problem.     Objective: Vital Signs: Ht 5\' 10"  (1.778 m)   Wt 226 lb (102.5 kg)   BMI 32.43 kg/m   Physical Exam    Constitutional: He is oriented to person, place, and time. He appears well-developed and well-nourished.  Pulmonary/Chest: Effort normal.  Neurological: He is alert and oriented to person, place, and time.  Psychiatric: He has a normal mood and affect.  Nursing note reviewed.   Back Exam   Tenderness  The patient is experiencing tenderness in the lumbar.  Range of Motion  The patient has normal back ROM.  Muscle Strength  The patient has normal back strength.  Tests  Straight leg raise right: negative Straight leg raise left: negative      Specialty Comments:  No specialty comments available.  Imaging: No results found. 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 PLEASE SEE ABOVE WITH REGARD TO DETAIL AT [facility] OF THESE LEVELS. IN SUMMARY, THE MOST SIGNIFICANT NEURAL FORAMINAL NARROWING IS AT THE L5-S1 LEVEL WHERE BILATERAL PARS DEFECTS AND ANTERIOR SLIPPAGE CONTRIBUTE TO THIS NARROWING AND NERVE ROOT COMPRESSION.  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  PMFS History: Patient Active Problem List   Diagnosis Date Noted  . Lower extremity edema 01/28/2015  . Angina pectoris ([place]) 07/08/2014  . Anemia, unspecified 05/06/2014  . Acute gastric ulcer 08/27/2013  .  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 IDA (iron deficiency anemia) 08/25/2013  . Mixed hyperlipidemia 08/13/2013  . Essential hypertension, benign 08/13/2013  . Other and unspecified angina pectoris 08/13/2013   Past Medical History:  Diagnosis Date  . Arthritis   . CMT (Charcot-Marie-Tooth disease)   . DM (diabetes mellitus) ([place])   . HLD (hyperlipidemia)   . HTN (hypertension)   . Overactive bladder   . Ulcer ([place])     Family History  Problem Relation Age of Onset  . Cervical cancer Mother     mets  . Breast cancer Sister     mets  . Diabetes Sister   . Diabetes Brother   . Heart  disease Sister   . Cervical cancer Sister     Past Surgical History:  Procedure Laterality Date  . COLONOSCOPY N/A 08/27/2013   Procedure: COLONOSCOPY;  Surgeon: Jerene Bears, MD;  Location: [facility];  Service: Gastroenterology;  Laterality: N/A;  . ESOPHAGOGASTRODUODENOSCOPY N/A 08/27/2013   Procedure: ESOPHAGOGASTRODUODENOSCOPY (EGD);  Surgeon: Jerene Bears, MD;  Location: [facility];  Service: Gastroenterology;  Laterality: N/A;  . FOOT AMPUTATION THROUGH METATARSAL Left   . FOOT SURGERY     age 12  . TOTAL HIP ARTHROPLASTY Bilateral 2002, 2003  . TOTAL KNEE ARTHROPLASTY Bilateral 1999   Social History   Occupational History  . retired Personal assistant    Social History Main Topics  . Smoking status: Never Smoker  . Smokeless tobacco: Never Used  . Alcohol use No  . Drug use: No  . Sexual activity: Not on file

## 2016-07-26 ENCOUNTER — Telehealth (INDEPENDENT_AMBULATORY_CARE_PROVIDER_SITE_OTHER): Payer: Self-pay | Admitting: *Deleted

## 2016-07-26 NOTE — Telephone Encounter (Signed)
Pt. Wife called stating there was a missed call from our office. She asked if next time we can leave a message since they are using a translator service it will be less headache for her if she knows why someone is calling.

## 2016-07-27 NOTE — Telephone Encounter (Signed)
I think you were calling this pt to make appt for [facility]

## 2016-08-07 ENCOUNTER — Encounter (INDEPENDENT_AMBULATORY_CARE_PROVIDER_SITE_OTHER): Payer: Self-pay | Admitting: Physical Medicine and Rehabilitation

## 2016-08-07 ENCOUNTER — Ambulatory Visit (INDEPENDENT_AMBULATORY_CARE_PROVIDER_SITE_OTHER): Payer: Medicare Other | Admitting: Physical Medicine and Rehabilitation

## 2016-08-07 VITALS — BP 152/85 | HR 79 | Temp 98.2°F

## 2016-08-07 DIAGNOSIS — Q762 Congenital spondylolisthesis: Secondary | ICD-10-CM | POA: Diagnosis not present

## 2016-08-07 DIAGNOSIS — M5416 Radiculopathy, lumbar region: Secondary | ICD-10-CM | POA: Diagnosis not present

## 2016-08-07 DIAGNOSIS — M4316 Spondylolisthesis, lumbar region: Secondary | ICD-10-CM | POA: Diagnosis not present

## 2016-08-07 MED ORDER — LIDOCAINE HCL (PF) 1 % IJ SOLN
0.3300 mL | Freq: Once | INTRAMUSCULAR | Status: AC
  Administered 2016-08-07: 0.3 mL

## 2016-08-07 MED ORDER — METHYLPREDNISOLONE ACETATE 80 MG/ML IJ SUSP
80.0000 mg | Freq: Once | INTRAMUSCULAR | Status: AC
  Administered 2016-08-07: 80 mg

## 2016-08-07 NOTE — Procedures (Signed)
Lumbosacral Transforaminal Epidural Steroid Injection - Infraneural Approach with Fluoroscopic Guidance  Patient: Christian [name]      Date of Birth: 06/26/1941 MRN: [id number] PCP: Wenda Low, MD      Visit Date: 08/07/2016   Universal Protocol:    Date/Time: 11/27/178:43 AM  Consent Given By: the patient  Position: PRONE   Additional Comments: Vital signs were monitored before and after the procedure. Patient was prepped and draped in the usual sterile fashion. The correct patient, procedure, and site was verified.   Injection Procedure Details:  Procedure Site One Meds Administered:  Meds ordered this encounter  Medications  . lidocaine (PF) (XYLOCAINE) 1 % injection 0.3 mL  . methylPREDNISolone acetate (DEPO-MEDROL) injection 80 mg      Laterality: Left  Location/Site:  L5-S1  Needle size: 22 G  Needle type: Spinal  Needle Placement: Transforaminal  Findings:  -Contrast Used: 1 mL iohexol 180 mg iodine/mL   -Comments: Excellent flow of contrast along the nerve and into the epidural space.  Procedure Details: After squaring off the end-plates of the desired vertebral level to get a true AP view, the C-arm was obliqued to the painful side so that the superior articulating process is positioned about 1/3 the length of the inferior endplate.  The needle was aimed toward the junction of the superior articular process and the transverse process of the inferior vertebrae. The needle's initial entry is in the lower third of the foramen through Kambin's triangle. The soft tissues overlying this target were infiltrated with 2-3 ml. of 1% Lidocaine without Epinephrine.  The spinal needle was then inserted and advanced toward the target using a "trajectory" view along the fluoroscope beam.  Under AP and lateral visualization, the needle was advanced so it did not puncture dura and did not traverse medially beyond the 6 o'clock position of the pedicle. Bi-planar projections  were used to confirm position. Aspiration was confirmed to be negative for CSF and/or blood. A 1-2 ml. volume of Isovue-250 was injected and flow of contrast was noted at each level. Radiographs were obtained for documentation purposes.   After attaining the desired flow of contrast documented above, a 0.5 to 1.0 ml test dose of 0.25% Marcaine was injected into each respective transforaminal space.  The patient was observed for 90 seconds post injection.  After no sensory deficits were reported, and normal lower extremity motor function was noted,   the above injectate was administered so that equal amounts of the injectate were placed at each foramen (level) into the transforaminal epidural space.   Additional Comments:  The patient tolerated the procedure well Dressing: Band-Aid    Post-procedure details: Patient was observed during the procedure. Post-procedure instructions were reviewed.  Patient left the clinic in stable condition.

## 2016-08-07 NOTE — Progress Notes (Signed)
2 [id number] Christian [name] - 75 y.o. male MRN [id number]  Date of birth: 12/01/1940  Office Visit Note: Visit Date: 08/07/2016 PCP: Wenda Low, MD Referred by: Wenda Low, MD  Subjective: Chief Complaint  Patient presents with  . Lower Back - Pain   HPI: Christian [name] is a 75 year old gentleman with hearing impairment who is present today with his wife who also is hearing impaired. He has been having low back and radicular leg pain left more than right. He has failed conservative care including opioids. He is taking oxycodone and hydrocodone and tramadol at various times. The tramadol is from his primary care physician and Dr. Sharol Given has been given him oxycodone off and on. It looks like recently he had some hydrocodone from the emergency room and [place]. He has had epidural injections in the past with some relief. MRIs have shown bilateral pars defects as well as chronic disc herniation.    Lower back pain sometimes radiating down legs- left more than right. Loss of balance- ambulates with cane. Sometimes numbness in left leg if sitting too long. Back pain for years.  No blood thinners, no contrast allergy, driver present. ROS Otherwise per HPI.  Assessment & Plan: Visit Diagnoses:  1. Lumbar radiculopathy   2. Spondylolisthesis of lumbar region   3. Congenital spondylolysis     Plan: Findings:  I am going to complete a diagnostic of the therapeutic left L5-S1 intralaminar epidural steroid injection. If he does not get much relief from a transforaminal approach would be considered. He is on opioids as well as anti-anxiety medications which are fairly high risk. If his opioids were to increase he would likely be better served in a comprehensive pain management program.    Meds & Orders:  Meds ordered this encounter  Medications  . lidocaine (PF) (XYLOCAINE) 1 % injection 0.3 mL  . methylPREDNISolone acetate (DEPO-MEDROL) injection 80 mg    Orders Placed  This Encounter  Procedures  . Epidural Steroid injection    Follow-up: Return if symptoms worsen or fail to improve.   Procedures: No procedures performed  Lumbosacral Transforaminal Epidural Steroid Injection - Infraneural Approach with Fluoroscopic Guidance  Patient: Christian [name]      Date of Birth: [birth year] MRN: [id number] PCP: Wenda Low, MD      Visit Date: 08/07/2016   Universal Protocol:    Date/Time: 11/27/738:43 AM  Consent Given By: the patient  Position: PRONE   Additional Comments: Vital signs were monitored before and after the procedure. Patient was prepped and draped in the usual sterile fashion. The correct patient, procedure, and site was verified.   Injection Procedure Details:  Procedure Site One Meds Administered:  Meds ordered this encounter  Medications  . lidocaine (PF) (XYLOCAINE) 1 % injection 0.3 mL  . methylPREDNISolone acetate (DEPO-MEDROL) injection 80 mg      Laterality: Left  Location/Site:  L5-S1  Needle size: 22 G  Needle type: Spinal  Needle Placement: Transforaminal  Findings:  -Contrast Used: 1 mL iohexol 180 mg iodine/mL   -Comments: Excellent flow of contrast along the nerve and into the epidural space.  Procedure Details: After squaring off the end-plates of the desired vertebral level to get a true AP view, the C-arm was obliqued to the painful side so that the superior articulating process is positioned about 1/3 the length of the inferior endplate.  The needle was aimed toward the junction of the superior articular process and the transverse process of the inferior vertebrae. The needle's  initial entry is in the lower third of the foramen through Kambin's triangle. The soft tissues overlying this target were infiltrated with 2-3 ml. of 1% Lidocaine without Epinephrine.  The spinal needle was then inserted and advanced toward the target using a "trajectory" view along the fluoroscope beam.  Under AP and lateral  visualization, the needle was advanced so it did not puncture dura and did not traverse medially beyond the 6 o'clock position of the pedicle. Bi-planar projections were used to confirm position. Aspiration was confirmed to be negative for CSF and/or blood. A 1-2 ml. volume of Isovue-250 was injected and flow of contrast was noted at each level. Radiographs were obtained for documentation purposes.   After attaining the desired flow of contrast documented above, a 0.5 to 1.0 ml test dose of 0.25% Marcaine was injected into each respective transforaminal space.  The patient was observed for 90 seconds post injection.  After no sensory deficits were reported, and normal lower extremity motor function was noted,   the above injectate was administered so that equal amounts of the injectate were placed at each foramen (level) into the transforaminal epidural space.   Additional Comments:  The patient tolerated the procedure well Dressing: Band-Aid    Post-procedure details: Patient was observed during the procedure. Post-procedure instructions were reviewed.  Patient left the clinic in stable condition.    Clinical History: No specialty comments available.  He reports that he has never smoked. He has never used smokeless tobacco. No results for input(s): HGBA1C, LABURIC in the last 8760 hours.  Objective:  VS:  HT:    WT:   BMI:     BP:(!) 152/85  HR:79bpm  TEMP:98.2 F (36.8 C)(Oral)  RESP:98 % Physical Exam  Musculoskeletal:  Patient ambulates with a cane. Hands show some wasting of FDI bilaterally and some intrinsic hand atrophy. He has good distal lower extremity strength.    Ortho Exam Imaging: No results found.  Past Medical/Family/Surgical/Social History: Medications & Allergies reviewed per EMR Patient Active Problem List   Diagnosis Date Noted  . Chronic left-sided low back pain with left-sided sciatica 07/24/2016  . Lower extremity edema 01/28/2015  . Angina pectoris  ([place]) 07/08/2014  . Anemia, unspecified 05/06/2014  . Acute gastric ulcer 08/27/2013  . 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 IDA (iron deficiency anemia) 08/25/2013  . Mixed hyperlipidemia 08/13/2013  . Essential hypertension, benign 08/13/2013  . Other and unspecified angina pectoris 08/13/2013   Past Medical History:  Diagnosis Date  . Arthritis   . CMT (Charcot-Marie-Tooth disease)   . DM (diabetes mellitus) ([place])   . HLD (hyperlipidemia)   . HTN (hypertension)   . Overactive bladder   . Ulcer ([place])    Family History  Problem Relation Age of Onset  . Cervical cancer Mother     mets  . Breast cancer Sister     mets  . Diabetes Sister   . Diabetes Brother   . Heart disease Sister   . Cervical cancer Sister    Past Surgical History:  Procedure Laterality Date  . COLONOSCOPY N/A 08/27/2013   Procedure: COLONOSCOPY;  Surgeon: Jerene Bears, MD;  Location: [facility];  Service: Gastroenterology;  Laterality: N/A;  . ESOPHAGOGASTRODUODENOSCOPY N/A 08/27/2013   Procedure: ESOPHAGOGASTRODUODENOSCOPY (EGD);  Surgeon: Jerene Bears, MD;  Location: [facility];  Service: Gastroenterology;  Laterality: N/A;  . FOOT AMPUTATION THROUGH METATARSAL Left   . FOOT SURGERY     age 78  . TOTAL HIP ARTHROPLASTY  Bilateral 2002, 2003  . TOTAL KNEE ARTHROPLASTY Bilateral 1999   Social History   Occupational History  . retired Personal assistant    Social History Main Topics  . Smoking status: Never Smoker  . Smokeless tobacco: Never Used  . Alcohol use No  . Drug use: No  . Sexual activity: Not on file

## 2016-08-07 NOTE — Patient Instructions (Signed)

## 2016-08-17 ENCOUNTER — Telehealth (INDEPENDENT_AMBULATORY_CARE_PROVIDER_SITE_OTHER): Payer: Self-pay | Admitting: *Deleted

## 2016-08-17 NOTE — Telephone Encounter (Incomplete)
Please tell him that the injection was done at L5-S1 level on the left for left low back hip and leg pain with imaging showing pars defect at that level with slippage. If the ER doctor can tell us which spot is better I would be happy to listen.  He can also return to see Theodosia Paling. For f/up if no help from injection

## 2016-08-17 NOTE — Telephone Encounter (Signed)
Called and left message through sign language interpreter for patient to call back to discuss.

## 2016-08-17 NOTE — Telephone Encounter (Signed)
Please tell him that the injection was done at L5-S1 level on the left for left low back hip and leg pain with imaging showing pars defect at that level with slippage. If the ER doctor can tell us which spot is better I would be happy to listen.  He can also return to see Theodosia Paling. For f/up if no help from injection. The other area of stenosis is L3-4 and L4-5 and an injection there might help if the first did not.

## 2016-08-17 NOTE — Telephone Encounter (Signed)
Pt called stating he got an inj. About a month ago and pt stated he was still in pain and went to the ER. He stated the dr there said the shot was given in the wrong place? And pt is asking why it was put where it was. Pt requesting call back [phone] pt stated you can leave a voice mail if he is not there.

## 2016-08-28 NOTE — Telephone Encounter (Signed)
Patient has not returned my call. Will wait for him to call back.

## 2016-10-21 ENCOUNTER — Other Ambulatory Visit: Payer: Self-pay | Admitting: Interventional Cardiology

## 2016-12-05 ENCOUNTER — Telehealth: Payer: Self-pay | Admitting: Interventional Cardiology

## 2016-12-05 ENCOUNTER — Other Ambulatory Visit: Payer: Self-pay | Admitting: Interventional Cardiology

## 2016-12-05 NOTE — Telephone Encounter (Signed)
New Message     *STAT* If patient is at the pharmacy, call can be transferred to refill team.   1. Which medications need to be refilled? (please list name of each medication and dose if known) nitroGLYCERIN (NITROSTAT) 0.4 MG SL tablet  2. Which pharmacy/location (including street and city if local pharmacy) is medication to be sent to? [id number] - [place], [place] - [address]  3. Do they need a 30 day or 90 day supply? 90 day supply

## 2016-12-06 MED ORDER — NITROGLYCERIN 0.4 MG SL SUBL: SUBLINGUAL_TABLET | SUBLINGUAL | 1 refills | Status: DC

## 2017-04-17 ENCOUNTER — Telehealth: Payer: Self-pay | Admitting: Interventional Cardiology

## 2017-04-17 MED ORDER — NITROGLYCERIN 0.4 MG SL SUBL: SUBLINGUAL_TABLET | SUBLINGUAL | 0 refills | Status: DC

## 2017-04-17 NOTE — Telephone Encounter (Signed)
New Message      *STAT* If patient is at the pharmacy, call can be transferred to refill team.   1. Which medications need to be refilled? (please list name of each medication and dose if known)  nitroGLYCERIN (NITROSTAT) 0.4 MG SL tablet PLACE 1 TABLET (0.4 MG TOTAL) UNDER THE TONGUE EVERY 5 (FIVE) MINUTES AS NEEDED FOR CHEST PAIN.      2. Which pharmacy/location (including street and city if local pharmacy) is medication to [facility] sent to? [facility] [address]   3. Do they need a 30 day or 90 day supply? [place]

## 2017-04-17 NOTE — Telephone Encounter (Signed)
Pt's medication was sent to pt's pharmacy as requested. Confirmation received.  °

## 2017-04-18 ENCOUNTER — Ambulatory Visit (INDEPENDENT_AMBULATORY_CARE_PROVIDER_SITE_OTHER): Payer: Medicare Other | Admitting: Orthopedic Surgery

## 2017-04-18 ENCOUNTER — Telehealth (INDEPENDENT_AMBULATORY_CARE_PROVIDER_SITE_OTHER): Payer: Self-pay

## 2017-04-18 DIAGNOSIS — S92514A Nondisplaced fracture of proximal phalanx of right lesser toe(s), initial encounter for closed fracture: Secondary | ICD-10-CM | POA: Diagnosis not present

## 2017-04-18 NOTE — Telephone Encounter (Signed)
Patient coming in today for right toe.  Patient had a fall on Monday night and think he may have broken his right toe.

## 2017-04-19 NOTE — Progress Notes (Signed)
Office Visit Note   Patient: Christian [name]           Date of Birth: 1940/11/09           MRN: [id number] Visit Date: 04/18/2017              Requested by: Wenda Low, MD [address] [place], [place] [address] PCP: Wenda Low, MD  No chief complaint on file.     HPI: Patient is a 76 year old gentleman who states that Monday night he was walking in the kitchen and lost his balance and fell. He states that the fourth toe popped and was straight up and down he reduced this himself he is presenting at this time for evaluation of the fourth toe fracture.  Assessment & Plan: Visit Diagnoses:  1. Closed nondisplaced fracture of proximal phalanx of lesser toe of right foot, initial encounter     Plan: Recommended he use his closed toed shoe wear at all times the toe is straight no need for further reduction or realignment.  Follow-Up Instructions: Return if symptoms worsen or fail to improve.   Ortho Exam  Patient is alert, oriented, no adenopathy, well-dressed, normal affect, normal respiratory effort. Examination patient ambulates with an extra-depth shoe the bleb} full weightbearing with a cane. Patient does have an antalgic gait. Examination his toe is straight there is some mild ecchymosis and bruising.  Two-view radiographs of the right foot shows fusion throughout the PIP joints of all toes however there is a fracture through the PIP fusion of the fourth toe. Patient has severe deformities to the midfoot with no acute fractures.  Imaging: No results found.  Labs: Lab Results  Component Value Date   HGBA1C (H) 12/04/2009    10.5 (NOTE) The ADA recommends the following therapeutic goal for glycemic control related to Hgb A1c measurement: Goal of therapy: <6.5 Hgb A1c  Reference: American Diabetes Association: Clinical Practice Recommendations 2010, Diabetes Care, 2010, 33: (Suppl  1).   REPTSTATUS 12/07/2010 FINAL 10/21/2010   REPTSTATUS  10/24/2010 FINAL 10/21/2010   REPTSTATUS 10/26/2010 FINAL 10/21/2010   GRAMSTAIN  10/21/2010    NO WBC SEEN NO SQUAMOUS EPITHELIAL CELLS SEEN NO ORGANISMS SEEN   GRAMSTAIN  10/21/2010    NO WBC SEEN NO SQUAMOUS EPITHELIAL CELLS SEEN NO ORGANISMS SEEN   CULT NO ACID FAST BACILLI ISOLATED IN 6 WEEKS 10/21/2010   CULT  10/21/2010    FEW STAPHYLOCOCCUS AUREUS Note: RIFAMPIN AND GENTAMICIN SHOULD NOT BE USED AS SINGLE DRUGS FOR TREATMENT OF STAPH INFECTIONS. DOXYCLINE SENSITIVE   CULT NO ANAEROBES ISOLATED 10/21/2010   LABORGA STAPHYLOCOCCUS AUREUS 10/21/2010    Orders:  No orders of the defined types were placed in this encounter.  No orders of the defined types were placed in this encounter.    Procedures: No procedures performed  Clinical Data: No additional findings.  ROS:  All other systems negative, except as noted in the HPI. Review of Systems  Objective: Vital Signs: There were no vitals taken for this visit.  Specialty Comments:  No specialty comments available.  PMFS History: Patient Active Problem List   Diagnosis Date Noted  . Chronic left-sided low back pain with left-sided sciatica 07/24/2016  . Lower extremity edema 01/28/2015  . Angina pectoris ([place]) 07/08/2014  . Anemia, unspecified 05/06/2014  . Acute gastric ulcer 08/27/2013  . 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 IDA (iron deficiency anemia) 08/25/2013  . Mixed hyperlipidemia 08/13/2013  . Essential hypertension, benign 08/13/2013  .  Other and unspecified angina pectoris 08/13/2013   Past Medical History:  Diagnosis Date  . Arthritis   . CMT (Charcot-Marie-Tooth disease)   . DM (diabetes mellitus) ([place])   . HLD (hyperlipidemia)   . HTN (hypertension)   . Overactive bladder   . Ulcer ([place])     Family History  Problem Relation Age of Onset  . Cervical cancer Mother        mets  . Breast cancer Sister        mets  . Diabetes Sister   . Diabetes Brother   . Heart disease Sister   .  Cervical cancer Sister     Past Surgical History:  Procedure Laterality Date  . COLONOSCOPY N/A 08/27/2013   Procedure: COLONOSCOPY;  Surgeon: Jerene Bears, MD;  Location: [facility];  Service: Gastroenterology;  Laterality: N/A;  . ESOPHAGOGASTRODUODENOSCOPY N/A 08/27/2013   Procedure: ESOPHAGOGASTRODUODENOSCOPY (EGD);  Surgeon: Jerene Bears, MD;  Location: [facility];  Service: Gastroenterology;  Laterality: N/A;  . FOOT AMPUTATION THROUGH METATARSAL Left   . FOOT SURGERY     age 33  . TOTAL HIP ARTHROPLASTY Bilateral 2002, 2003  . TOTAL KNEE ARTHROPLASTY Bilateral 1999   Social History   Occupational History  . retired Personal assistant    Social History Main Topics  . Smoking status: Never Smoker  . Smokeless tobacco: Never Used  . Alcohol use No  . Drug use: No  . Sexual activity: Not on file

## 2017-04-24 ENCOUNTER — Ambulatory Visit (INDEPENDENT_AMBULATORY_CARE_PROVIDER_SITE_OTHER): Payer: Medicare Other | Admitting: Physician Assistant

## 2017-04-24 ENCOUNTER — Encounter: Payer: Self-pay | Admitting: Physician Assistant

## 2017-04-24 ENCOUNTER — Encounter (INDEPENDENT_AMBULATORY_CARE_PROVIDER_SITE_OTHER): Payer: Self-pay

## 2017-04-24 VITALS — BP 138/62 | HR 87 | Ht 70.0 in | Wt 211.9 lb

## 2017-04-24 DIAGNOSIS — E782 Mixed hyperlipidemia: Secondary | ICD-10-CM | POA: Diagnosis not present

## 2017-04-24 DIAGNOSIS — Z794 Long term (current) use of insulin: Secondary | ICD-10-CM | POA: Diagnosis not present

## 2017-04-24 DIAGNOSIS — E119 Type 2 diabetes mellitus without complications: Secondary | ICD-10-CM | POA: Diagnosis not present

## 2017-04-24 DIAGNOSIS — I209 Angina pectoris, unspecified: Secondary | ICD-10-CM

## 2017-04-24 DIAGNOSIS — I701 Atherosclerosis of renal artery: Secondary | ICD-10-CM

## 2017-04-24 DIAGNOSIS — I10 Essential (primary) hypertension: Secondary | ICD-10-CM

## 2017-04-24 DIAGNOSIS — R0602 Shortness of breath: Secondary | ICD-10-CM

## 2017-04-24 MED ORDER — ISOSORBIDE MONONITRATE ER 60 MG PO TB24: 60.0000 mg | ORAL_TABLET | Freq: Every day | ORAL | 3 refills | Status: DC

## 2017-04-24 NOTE — Patient Instructions (Signed)
Medication Instructions:  1. INCREASE IMDUR TO 60 MG  DAILY  Labwork: TODAY BMET, CBC, PRO BNP  Testing/Procedures: Your physician has requested that you have a lexiscan myoview. For further information please visit [email or web address]. Please follow instruction sheet, as given.    Follow-Up: DR. VARANASI IN 3-4 WEEKS   Any Other Special Instructions Will Be Listed Below (If Applicable).     If you need a refill on your cardiac medications before your next appointment, please call your pharmacy.

## 2017-04-24 NOTE — Progress Notes (Signed)
Cardiology Office Note:    Date:  04/24/2017   ID:  Christian [name], DOB [birth year], MRN [id number]  PCP:  Christian Low, MD  Cardiologist:  Dr. Casandra [name]    Referring MD: Christian Low, MD   Chief Complaint  Patient presents with  . Follow-up    hx of angina tx medically  . Chest Pain  . 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  He was evaluated for angina in 2014. He was set up for cardiac catheterization but was found to be anemic. Instead, he underwent GI evaluation with EGD which demonstrated gastric ulcers. His symptoms improved with improved hemoglobin. Last seen by Dr. Irish [name] 5/16.  Christian [name] is here for Cardiology follow up.  He is seen along with a sign language interpreter.  He has a lot of issues with his balance and weakness.  He has a hx of L transmet amputation.  He is having progressive issues with ambulating.  He has noted chest pain and shortness of breath with certain activities.  He denies associated symptoms.  He takes NTG with relief at times. He does not feel that his symptoms are getting any worse.  He denies paroxysmal nocturnal dyspnea, syncope.  He does have some LE edema that is worse with standing and better when he props up his legs.  He denies any bleeding issues.  He does not smoke.  He does have a hx of productive cough in the mornings for years.   Prior CV studies:   The following studies were reviewed today:  Echo 8/15 Mild LVH, EF 55-60, normal wall motion, grade 2 diastolic dysfunction  Past Medical History:  Diagnosis Date  . Arthritis   . CMT (Charcot-Marie-Tooth disease)   . DM (diabetes mellitus) ([place])   . HLD (hyperlipidemia)   . HTN (hypertension)   . Overactive bladder   . Ulcer     Past Surgical History:  Procedure Laterality Date  . COLONOSCOPY N/A 08/27/2013   Procedure:  COLONOSCOPY;  [name]: Christian Bears, MD;  Location: [facility];  Service: Gastroenterology;  Laterality: N/A;  . ESOPHAGOGASTRODUODENOSCOPY N/A 08/27/2013   Procedure: ESOPHAGOGASTRODUODENOSCOPY (EGD);  [name]: Christian Bears, MD;  Location: [facility];  Service: Gastroenterology;  Laterality: N/A;  . FOOT AMPUTATION THROUGH METATARSAL Left   . FOOT SURGERY     age 73  . TOTAL HIP ARTHROPLASTY Bilateral 2002, 2003  . TOTAL KNEE ARTHROPLASTY Bilateral 1999    Current Medications: Current Meds  Medication Sig  . amLODipine-benazepril (LOTREL) 5-20 MG per capsule Take 1 capsule by mouth daily.  . Cholecalciferol (VITAMIN D) 1000 UNITS capsule Take 1,000 Units by mouth 3 (three) times a week. Monday, Wednesday, Saturday  . cyclobenzaprine (FLEXERIL) 10 MG tablet Take 10 mg by mouth 3 (three) times daily as needed for muscle spasms.   . fexofenadine (ALLEGRA) 180 MG tablet Take 180 mg by mouth daily as needed for allergies.   . GuaiFENesin (MUCINEX PO) Take 1 tablet by mouth daily as needed (to loosen phlegm).  . insulin glargine (LANTUS) 100 units/mL SOLN Inject 30 Units into the skin at bedtime.  Marland Kitchen LORazepam (ATIVAN) 2 MG tablet Take 1 mg by mouth every 6 (six) hours as needed for anxiety. Take one half tablet by mouth in the morning and one half tablet by mouth in the evening  .  metFORMIN (GLUCOPHAGE) 1000 MG tablet Take 1,000 mg by mouth 2 (two) times daily with a meal.  . nitroGLYCERIN (NITROSTAT) 0.4 MG SL tablet PLACE 1 TABLET (0.4 MG TOTAL) UNDER THE TONGUE EVERY 5 (FIVE) MINUTES AS NEEDED FOR CHEST PAIN.  Marland Kitchen oxybutynin (DITROPAN-XL) 10 MG 24 hr tablet Take 10 mg by mouth daily as needed (for overactive bladder.). Take one tablet by mouth twice daily as needed for overactive bladder  . pantoprazole (PROTONIX) 40 MG tablet Take 1 tablet (40 mg total) by mouth daily.  . pioglitazone (ACTOS) 30 MG tablet Take 30 mg by mouth daily.  . polyethylene glycol (MIRALAX / GLYCOLAX) packet Take 17  g by mouth 2 (two) times daily as needed (constipation).   . rosuvastatin (CRESTOR) 10 MG tablet Take 10 mg by mouth at bedtime.   . traMADol (ULTRAM) 50 MG tablet Take 1-2 tablet up to 3 times daly as needed for pain  . [DISCONTINUED] isosorbide mononitrate (IMDUR) 30 MG 24 hr tablet TAKE 1 TABLET EVERY DAY     Allergies:   Atorvastatin   Social History   Social History  . Marital status: Married    Spouse name: N/A  . Number of children: 4  . Years of education: N/A   Occupational History  . retired Personal assistant    Social History Main Topics  . Smoking status: Never Smoker  . Smokeless tobacco: Never Used  . Alcohol use No  . Drug use: No  . Sexual activity: Not Asked   Other Topics Concern  . None   Social History Narrative  . None     Family Hx: The patient's family history includes Breast cancer in his sister; Cervical cancer in his mother and sister; Diabetes in his brother and sister; Heart disease in his sister.  ROS:   Please see the history of present illness.    ROS All other systems reviewed and are negative.   EKGs/Labs/Other Test Reviewed:    EKG:  EKG is  ordered today.  The ekg ordered today demonstrates NSR, HR 87, normal axis, nonspecific ST-T wave changes, QTc 423 ms, no change since prior tracing  Recent Labs: No results found for requested labs within last 8760 hours.   Recent Lipid Panel No results found for: CHOL, TRIG, HDL, CHOLHDL, LDLCALC, LDLDIRECT  Physical Exam:    VS:  BP 138/62   Pulse 87   Ht 5\' 10"  (1.778 m)   Wt 211 lb 14.4 oz (96.1 kg)   SpO2 96%   BMI 30.40 kg/m     Wt Readings from Last 3 Encounters:  04/24/17 211 lb 14.4 oz (96.1 kg)  07/24/16 226 lb (102.5 kg)  01/28/15 226 lb (102.5 kg)     Physical Exam  Constitutional: He is oriented to person, place, and time. He appears well-developed and well-nourished. No distress.  HENT:  Head: Normocephalic and atraumatic.  Eyes: No scleral icterus.  Neck: No JVD  present.  Cardiovascular: Normal rate and regular rhythm.   No murmur heard. Pulmonary/Chest: He has no wheezes. He has no rales.  Abdominal: Soft. There is no tenderness.  Musculoskeletal: He exhibits edema (trace - 1+ bilateral LEedema. ).  Neurological: He is alert and oriented to person, place, and time.  Skin: Skin is warm and dry.  Psychiatric: He has a normal mood and affect.    ASSESSMENT:    1. Angina pectoris ([place])   2. Shortness of breath   3. 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 Essential hypertension, benign   5. Mixed hyperlipidemia   6. Type 2 diabetes mellitus without complication, with long-term current use of insulin (HCC)    PLAN:    In order of problems listed above:  1. Angina pectoris ([place]) -  He has a prior hx of angina that improved with improved Hgb.  He has been tx medically since that time.  He notes exertional chest pain and shortness of breath over the past several months.  It is not getting any worse.  His ECG does not show any acute changes.  He has been taking NTG with relief of his symptoms.  He has a CAD risk equivalent with diabetes and has a prior CT with evidence of diffuse atherosclerosis especially in the R renal artery.  Therefore, he is high risk for CAD. However, his symptoms have typical and atypical features.  He also has symptoms of COPD.    -  Arrange Lexiscan Myoview.  If high risk, he will need Cardiac Catheterization   -  BMET, BNP, CBC today  -  Increase Imdur to 60 mg QD.  -  Consider beta-blocker if BP will allow at follow up   -  He is not on ASA - I presume b/c of prior suspected GI bleed  2. Shortness of breath -  He notes fairly chronic shortness of breath with exertion and a long hx of a morning cough with sputum production.  He smoked once when he was a teenager.  But, he has had a hx of 2nd hand smoke exposure.  His last echo did demonstrate mod diastolic dysfunction.  He also notes a hx of snoring.  His wife has not  witnessed apnea.    -  Proceed with Nuclear stress test as noted   -  Obtain BMET, BNP, CBC  -  Add Lasix if BNP elevated  -  If cardiac w/u unremarkable or dyspnea and cough continue, consider referral to Pulmonology  -  If his wife notes apnea, he will need sleep study  3. 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  Continue statin.   4. Essential hypertension, benign The patient's blood pressure is controlled on his current regimen.     5. Mixed hyperlipidemia Managed by PCP. Continue statin.  6. Type 2 diabetes mellitus without complication, with long-term current use of insulin (HCC)  A1c 6.7 in 1/18.  FU with PCP.   Dispo:  Return in about 4 weeks (around 05/22/2017) for Follow up after testing w/ Dr. Irish [name].   Medication Adjustments/Labs and Tests Ordered: Current medicines are reviewed at length with the patient today.  Concerns regarding medicines are outlined above.  Tests Ordered: Orders Placed This Encounter  Procedures  . Basic Metabolic Panel (BMET)  . CBC  . Pro b natriuretic peptide  . Myocardial Perfusion Imaging  . EKG 12-Lead   Medication Changes: Meds ordered this encounter  Medications  . isosorbide mononitrate (IMDUR) 60 MG 24 hr tablet    Sig: Take 1 tablet (60 mg total) by mouth daily.    Dispense:  90 tablet    Refill:  3    Signed, Richardson Dopp, PA-C  04/24/2017 5:06 PM    [facility] [place], [place], [place]  [address] Phone: [phone]; Fax: [phone]

## 2017-04-25 ENCOUNTER — Telehealth: Payer: Self-pay | Admitting: *Deleted

## 2017-04-25 ENCOUNTER — Telehealth: Payer: Self-pay | Admitting: Interventional Cardiology

## 2017-04-25 DIAGNOSIS — I10 Essential (primary) hypertension: Secondary | ICD-10-CM

## 2017-04-25 LAB — BASIC METABOLIC PANEL
BUN/Creatinine Ratio: 21 (ref 10–24)
BUN: 32 mg/dL — ABNORMAL HIGH (ref 8–27)
CALCIUM: 9 mg/dL (ref 8.6–10.2)
CHLORIDE: 104 mmol/L (ref 96–106)
CO2: 19 mmol/L — AB (ref 20–29)
Creatinine, Ser: 1.52 mg/dL — ABNORMAL HIGH (ref 0.76–1.27)
GFR calc Af Amer: 52 mL/min/{1.73_m2} — ABNORMAL LOW (ref >59)
GFR calc non Af Amer: 45 mL/min/{1.73_m2} — ABNORMAL LOW (ref >59)
GLUCOSE: 95 mg/dL (ref 65–99)
POTASSIUM: 5.4 mmol/L — AB (ref 3.5–5.2)
SODIUM: 139 mmol/L (ref 134–144)

## 2017-04-25 LAB — CBC
HEMOGLOBIN: 13.1 g/dL (ref 13.0–17.7)
Hematocrit: 40 % (ref 37.5–51.0)
MCH: 29.2 pg (ref 26.6–33.0)
MCHC: 32.8 g/dL (ref 31.5–35.7)
MCV: 89 fL (ref 79–97)
PLATELETS: 301 10*3/uL (ref 150–379)
RBC: 4.49 x10E6/uL (ref 4.14–5.80)
RDW: 14.4 % (ref 12.3–15.4)
WBC: 8.8 10*3/uL (ref 3.4–10.8)

## 2017-04-25 LAB — PRO B NATRIURETIC PEPTIDE: NT-Pro BNP: 44 pg/mL (ref 0–376)

## 2017-04-25 NOTE — Telephone Encounter (Signed)
-----   Message from Liliane Shi, [place] sent at 04/25/2017  4:49 PM EDT ----- Please call the patient Kidney function is worse compared to 2 years ago (creatinine 1.2 >> 1.52). Potassium elevated. Hemoglobin, BNP normal PLAN:  1. Stop Lotrel 2. Start Amlodipine 5 mg QD. 3. BMET 1 week. 4. Fax labs to PCP.  He should follow up with PCP for elevated Creatinine/discontinuation of ACE inhibitor in setting of diabetes. 5. Monitor BP and call if consistently > 140/90. Richardson Dopp, PA-C    04/25/2017 4:48 PM

## 2017-04-25 NOTE — Telephone Encounter (Signed)
New message      Wife is calling because pt has a nuclear stress test scheduled on 05-01-17.  She states that he cannot lift his arm over his head.  Is this going to be a problem?  Please call

## 2017-04-25 NOTE — Telephone Encounter (Signed)
Called patient through hearing impairment interpreter and advised that per Pam in nuclear medicine at our [place] office, the test can be completed if the patient is unable to raise his arm above his head. Per the interpreter, patient verbalized understanding.

## 2017-04-25 NOTE — Telephone Encounter (Signed)
Lmtcb to go over lab results and recommendations.  

## 2017-04-26 ENCOUNTER — Telehealth (HOSPITAL_COMMUNITY): Payer: Self-pay | Admitting: *Deleted

## 2017-04-26 MED ORDER — AMLODIPINE BESYLATE 5 MG PO TABS: 5.0000 mg | ORAL_TABLET | Freq: Every day | ORAL | 3 refills | Status: DC

## 2017-04-26 NOTE — Telephone Encounter (Signed)
-----   Message from Liliane Shi, [place] sent at 04/25/2017  4:49 PM EDT ----- Please call the patient Kidney function is worse compared to 2 years ago (creatinine 1.2 >> 1.52). Potassium elevated. Hemoglobin, BNP normal PLAN:  1. Stop Lotrel 2. Start Amlodipine 5 mg QD. 3. BMET 1 week. 4. Fax labs to PCP.  He should follow up with PCP for elevated Creatinine/discontinuation of ACE inhibitor in setting of diabetes. 5. Monitor BP and call if consistently > 140/90. Richardson Dopp, PA-C    04/25/2017 4:48 PM

## 2017-04-26 NOTE — Telephone Encounter (Signed)
Follow up    Pt is calling back for [facility]. He asked when she calls back please leave a message cause he is outside working in the yard. He said if he doesn't answer today the best time to reach him is between 8-9am.

## 2017-04-26 NOTE — Telephone Encounter (Signed)
Lmtcb to go over lab results. Lmom that I will call pt back later after I have finished clinic. Relay interpreter [phone]

## 2017-04-26 NOTE — Telephone Encounter (Signed)
Pt and I kept missing each other's calls to go over pt's lab results and medication changes. Pt's wife answered and said pt was still out and that I could talk with her. I explained to her that I know she is on as emergency contact, though I saw on the DPR for their daughter Christian [name]. I asked if Christian [name] was available and she said no. I explained to her since we needed to make these medication changes and it has been hard to reach the pt I will go over the results though advised Christian [name] is going to need to update his DPR to also give permission ok to with her (wife) as well. Wife was agreeable to this and will let the pt know as well.  Pt's wife has been notified through interpreter for the [place] of results and medication changes and recommendations.  PLAN:  1. Stop Lotrel 2. Start Amlodipine 5 mg QD. 3. BMET 1 week. This will be done when pt comes in for his                              Myoview 8/21 4. Fax labs to PCP. He should follow up with PCP for elevated Creatinine/discontinuation of ACE inhibitor in setting of diabetes. 5. Monitor BP and call if consistently > 140/90.  Pt's wife thanked me for the call and is agreeable to plan of care for the pt. I advised if pt has any questions after she goes over this with him to please call the office [phone]. I told her that I will not be in the office tomorrow though they can speak with any of the nurses in Triage. Pt's wife thanked me again for my help. I reminded her again to make sure pt fills out new DPR to include her name on there as well.

## 2017-04-26 NOTE — Telephone Encounter (Signed)
Left message on voicemail 05/01/17 in reference to upcoming appointment scheduled on  with detailed instructions given per Myocardial Perfusion Study Information Sheet for the test. LM to arrive 15 minutes early, and that it is imperative to arrive on time for appointment to keep from having the test rescheduled. If you need to cancel or reschedule your appointment, please call the office within 24 hours of your appointment. Failure to do so may result in a cancellation of your appointment, and a $50 no show fee. Phone number given for call back for any questions.  Kirstie Peri

## 2017-04-26 NOTE — Telephone Encounter (Signed)
Patient calling, states that he is returning your call. Thanks.

## 2017-05-01 ENCOUNTER — Other Ambulatory Visit: Payer: Medicare Other | Admitting: *Deleted

## 2017-05-01 ENCOUNTER — Ambulatory Visit (HOSPITAL_COMMUNITY): Payer: Medicare Other | Attending: Cardiovascular Disease

## 2017-05-01 DIAGNOSIS — R0602 Shortness of breath: Secondary | ICD-10-CM | POA: Insufficient documentation

## 2017-05-01 DIAGNOSIS — R9431 Abnormal electrocardiogram [ECG] [EKG]: Secondary | ICD-10-CM | POA: Diagnosis not present

## 2017-05-01 DIAGNOSIS — I10 Essential (primary) hypertension: Secondary | ICD-10-CM | POA: Diagnosis not present

## 2017-05-01 DIAGNOSIS — R9439 Abnormal result of other cardiovascular function study: Secondary | ICD-10-CM | POA: Diagnosis not present

## 2017-05-01 DIAGNOSIS — I209 Angina pectoris, unspecified: Secondary | ICD-10-CM | POA: Diagnosis present

## 2017-05-01 DIAGNOSIS — E119 Type 2 diabetes mellitus without complications: Secondary | ICD-10-CM | POA: Insufficient documentation

## 2017-05-01 LAB — MYOCARDIAL PERFUSION IMAGING
CHL CUP NUCLEAR SRS: 8
CHL CUP NUCLEAR SSS: 8
LHR: 0.27
LV sys vol: 41 mL
LVDIAVOL: 101 mL (ref 62–150)
NUC STRESS TID: 0.96
Peak HR: 94 {beats}/min
Rest HR: 80 {beats}/min
SDS: 0

## 2017-05-01 LAB — BASIC METABOLIC PANEL
BUN / CREAT RATIO: 27 — AB (ref 10–24)
BUN: 35 mg/dL — AB (ref 8–27)
CALCIUM: 8.9 mg/dL (ref 8.6–10.2)
CHLORIDE: 103 mmol/L (ref 96–106)
CO2: 17 mmol/L — ABNORMAL LOW (ref 20–29)
Creatinine, Ser: 1.29 mg/dL — ABNORMAL HIGH (ref 0.76–1.27)
GFR, EST AFRICAN AMERICAN: 63 mL/min/{1.73_m2} (ref >59)
GFR, EST NON AFRICAN AMERICAN: 55 mL/min/{1.73_m2} — AB (ref >59)
Glucose: 106 mg/dL — ABNORMAL HIGH (ref 65–99)
POTASSIUM: 4.9 mmol/L (ref 3.5–5.2)
Sodium: 136 mmol/L (ref 134–144)

## 2017-05-01 MED ORDER — TECHNETIUM TC 99M TETROFOSMIN IV KIT
10.1000 | PACK | Freq: Once | INTRAVENOUS | Status: AC | PRN
  Administered 2017-05-01: 10.1 via INTRAVENOUS
  Filled 2017-05-01: qty 11

## 2017-05-01 MED ORDER — TECHNETIUM TC 99M TETROFOSMIN IV KIT
32.7000 | PACK | Freq: Once | INTRAVENOUS | Status: AC | PRN
  Administered 2017-05-01: 32.7 via INTRAVENOUS
  Filled 2017-05-01: qty 33

## 2017-05-01 MED ORDER — REGADENOSON 0.4 MG/5ML IV SOLN
0.4000 mg | Freq: Once | INTRAVENOUS | Status: AC
  Administered 2017-05-01: 0.4 mg via INTRAVENOUS

## 2017-05-01 NOTE — Progress Notes (Unsigned)
Christian [name] here as interpreter.  Christian [name]

## 2017-05-02 ENCOUNTER — Encounter: Payer: Self-pay | Admitting: Physician Assistant

## 2017-05-02 ENCOUNTER — Telehealth: Payer: Self-pay | Admitting: Interventional Cardiology

## 2017-05-02 NOTE — Telephone Encounter (Signed)
Follow up    Pt returning call about lab work.

## 2017-05-02 NOTE — Telephone Encounter (Signed)
-----   Message from Liliane Shi, [place] sent at 05/01/2017  5:19 PM EDT ----- Please call the patient Kidney function is somewhat improved. Continue with current treatment plan. Richardson Dopp, PA-C   05/01/2017 5:19 PM

## 2017-05-02 NOTE — Telephone Encounter (Signed)
-----   Message from Liliane Shi, [place] sent at 05/02/2017  7:16 AM EDT ----- Please call the patient. The stress test is normal.  Continue current treatment plan.  Please route a copy of this study result to his PCP:  Wenda Low, MD  Richardson Dopp, PA-C    05/02/2017 7:14 AM

## 2017-05-02 NOTE — Telephone Encounter (Signed)
Patient made aware of results via sign language interpreter. Patient understands per interpreter.   Also had a staff message stating that the patient needed a F/U appointment with Dr. Irish Lack after his testing. Appointment made for patient on 8/24 at 1:40 PM. Patient made aware via interpreter.

## 2017-05-02 NOTE — Telephone Encounter (Signed)
Christian [name] is returning a call about his test and labs . Please call

## 2017-05-04 ENCOUNTER — Encounter: Payer: Self-pay | Admitting: Interventional Cardiology

## 2017-05-04 ENCOUNTER — Ambulatory Visit (INDEPENDENT_AMBULATORY_CARE_PROVIDER_SITE_OTHER): Payer: Medicare Other | Admitting: Interventional Cardiology

## 2017-05-04 VITALS — BP 130/70 | HR 79 | Ht 70.0 in | Wt 206.0 lb

## 2017-05-04 DIAGNOSIS — E118 Type 2 diabetes mellitus with unspecified complications: Secondary | ICD-10-CM | POA: Diagnosis not present

## 2017-05-04 DIAGNOSIS — E782 Mixed hyperlipidemia: Secondary | ICD-10-CM

## 2017-05-04 DIAGNOSIS — Z794 Long term (current) use of insulin: Secondary | ICD-10-CM

## 2017-05-04 DIAGNOSIS — R0602 Shortness of breath: Secondary | ICD-10-CM | POA: Diagnosis not present

## 2017-05-04 DIAGNOSIS — I10 Essential (primary) hypertension: Secondary | ICD-10-CM

## 2017-05-04 NOTE — Progress Notes (Signed)
Cardiology Office Note   Date:  05/04/2017   ID:  Christian, [name] 02-24-1941, MRN [id number]  PCP:  Christian Low, MD    Chief Complaint  Patient presents with  . Follow-up    after test per Christian Dopp, PA-C     Wt Readings from Last 3 Encounters:  05/04/17 206 lb (93.4 kg)  05/01/17 211 lb (95.7 kg)  04/24/17 211 lb 14.4 oz (96.1 kg)       History of Present Illness: Christian [name] is a 76 y.o. male  with a hx of Diabetes, HTN, HL, Charcot-Marie-Tooth disease, peptic ulcer disease.  He was evaluated for angina in 2014. He was set up for cardiac catheterization but was found to be anemic. Instead, he underwent GI evaluation with EGD which demonstrated gastric ulcers. His symptoms improved with improved hemoglobin.  History per last visit with Christian [name].   He reported some chest pain and SHOB and was seen by Christian [name] a week ago.  A stress test was done in 8/18 which was negative for ischemia.   The left ventricular ejection fraction is normal (55-65%).  Nuclear stress EF: 59%.  There was no ST segment deviation noted during stress.  This is a [name] risk study.  Denies : Chest pain. Dizziness.  Orthopnea. Palpitations. Paroxysmal nocturnal dyspnea. Syncope.      Past Medical History:  Diagnosis Date  . Angina pectoris ([place])    Nuclear stress test 8/18: EF 59, fixed inf-lat defect likely attenuation, no ischemia, [name] Risk  . Arthritis   . CMT (Charcot-Marie-Tooth disease)   . DM (diabetes mellitus) ([place])   . HLD (hyperlipidemia)   . HTN (hypertension)   . Overactive bladder   . Ulcer     Past Surgical History:  Procedure Laterality Date  . COLONOSCOPY N/A 08/27/2013   Procedure: COLONOSCOPY;  Surgeon: Christian Bears, MD;  Location: [facility];  Service: Gastroenterology;  Laterality: N/A;  . ESOPHAGOGASTRODUODENOSCOPY N/A 08/27/2013   Procedure: ESOPHAGOGASTRODUODENOSCOPY (EGD);  Surgeon: Christian Bears, MD;  Location: [facility];  Service:  Gastroenterology;  Laterality: N/A;  . FOOT AMPUTATION THROUGH METATARSAL Left   . FOOT SURGERY     age 88  . TOTAL HIP ARTHROPLASTY Bilateral 2002, 2003  . TOTAL KNEE ARTHROPLASTY Bilateral 1999     Current Outpatient Prescriptions  Medication Sig Dispense Refill  . amLODipine (NORVASC) 5 MG tablet Take 1 tablet (5 mg total) by mouth daily. 90 tablet 3  . Cholecalciferol (VITAMIN D) 1000 UNITS capsule Take 1,000 Units by mouth 3 (three) times a week. Monday, Wednesday, Saturday    . cyclobenzaprine (FLEXERIL) 10 MG tablet Take 10 mg by mouth 3 (three) times daily as needed for muscle spasms.     . diclofenac (VOLTAREN) 75 MG EC tablet Take 75 mg by mouth 2 (two) times daily as needed for pain.    . fexofenadine (ALLEGRA) 180 MG tablet Take 180 mg by mouth daily as needed for allergies.     . GuaiFENesin (MUCINEX PO) Take 1 tablet by mouth daily as needed (to loosen phlegm).    . insulin glargine (LANTUS) 100 units/mL SOLN Inject 20 Units into the skin at bedtime.     . isosorbide mononitrate (IMDUR) 60 MG 24 hr tablet Take 1 tablet (60 mg total) by mouth daily. 90 tablet 3  . LORazepam (ATIVAN) 2 MG tablet Take 2 mg by mouth at bedtime. Take one half tablet by mouth in the morning and  one half tablet by mouth in the evening     . metFORMIN (GLUCOPHAGE) 1000 MG tablet Take 1,000 mg by mouth 2 (two) times daily with a meal.    . nitroGLYCERIN (NITROSTAT) 0.4 MG SL tablet PLACE 1 TABLET (0.4 MG TOTAL) UNDER THE TONGUE EVERY 5 (FIVE) MINUTES AS NEEDED FOR CHEST PAIN. 25 tablet 0  . pioglitazone (ACTOS) 30 MG tablet Take 30 mg by mouth daily.    . polyethylene glycol (MIRALAX / GLYCOLAX) packet Take 17 g by mouth 2 (two) times daily as needed (constipation).     . rosuvastatin (CRESTOR) 10 MG tablet Take 10 mg by mouth at bedtime.     . traMADol (ULTRAM) 50 MG tablet Take 1-2 tablet up to 3 times daly as needed for pain 100 tablet 0   No current facility-administered medications for this  visit.     Allergies:   Atorvastatin    Social History:  The patient  reports that he has never smoked. He has never used smokeless tobacco. He reports that he does not drink alcohol or use drugs.   Family History:  The patient's family history includes Breast cancer in his sister; Cervical cancer in his mother and sister; Diabetes in his brother and sister; Heart disease in his sister.    ROS:  Please see the history of present illness.   Otherwise, review of systems are positive for .   All other systems are reviewed and negative.    PHYSICAL EXAM: VS:  BP 130/70   Pulse 79   Ht 5\' 10"  (1.778 m)   Wt 206 lb (93.4 kg)   BMI 29.56 kg/m  , BMI Body mass index is 29.56 kg/m. GEN: Well nourished, well developed, in no acute distress  HEENT: normal  Neck: no JVD, carotid bruits, or masses Cardiac: RRR; no murmurs, rubs, or gallops,; bilateral lower extremity pitting edema  Respiratory:  clear to auscultation bilaterally, normal work of breathing GI: soft, nontender, nondistended, + BS MS: no deformity or atrophy  Skin: warm and dry, no rash Neuro:  Strength and sensation are intact Psych: euthymic mood, full affect     Recent Labs: 04/24/2017: Hemoglobin 13.1; NT-Pro BNP 44; Platelets 301 05/01/2017: BUN 35; Creatinine, Ser 1.29; Potassium 4.9; Sodium 136   Lipid Panel No results found for: CHOL, TRIG, HDL, CHOLHDL, VLDL, LDLCALC, LDLDIRECT   Other studies Reviewed: Additional studies/ records that were reviewed today with results demonstrating: stress test reviewed.   ASSESSMENT AND PLAN:  1. Angina pectoris: If sx arise, would have to pursue cath.  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  Cath if he has worsening sx.  2. SHOB: Sx stable.  He is able to work outside.  Normal LV function by stress test.   3. HTN: BP controlled.  Continue current meds. 4. Hyperlipidemia: LDL 88  in May 2018.  5. 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 COntinue current therapy.     Current medicines are reviewed at length with the patient today.  The patient concerns regarding his medicines were addressed.  The following changes have been made:  No change  Labs/ tests ordered today include:  No orders of the defined types were placed in this encounter.   Recommend 150 minutes/week of aerobic exercise [name] fat, [name] carb, high fiber diet recommended  Disposition:   FU in 1 year   Signed, Conception Oms  Irish Lack, MD  05/04/2017 2:14 PM    [facility] [place], [place], [place]  [address] Phone: [phone]; Fax: [phone]

## 2017-05-04 NOTE — Patient Instructions (Signed)

## 2017-05-16 ENCOUNTER — Other Ambulatory Visit: Payer: Self-pay | Admitting: Interventional Cardiology

## 2017-05-23 ENCOUNTER — Ambulatory Visit: Payer: Medicare Other | Admitting: Interventional Cardiology

## 2017-07-12 ENCOUNTER — Other Ambulatory Visit: Payer: Self-pay | Admitting: Interventional Cardiology

## 2017-07-12 MED ORDER — AMLODIPINE BESYLATE 5 MG PO TABS: 5.0000 mg | ORAL_TABLET | Freq: Every day | ORAL | 2 refills | Status: DC

## 2017-08-09 ENCOUNTER — Encounter (INDEPENDENT_AMBULATORY_CARE_PROVIDER_SITE_OTHER): Payer: Self-pay | Admitting: Orthopedic Surgery

## 2017-08-09 ENCOUNTER — Ambulatory Visit (INDEPENDENT_AMBULATORY_CARE_PROVIDER_SITE_OTHER): Payer: Medicare Other | Admitting: Orthopedic Surgery

## 2017-08-09 DIAGNOSIS — M25531 Pain in right wrist: Secondary | ICD-10-CM | POA: Diagnosis not present

## 2017-08-09 DIAGNOSIS — M25512 Pain in left shoulder: Secondary | ICD-10-CM

## 2017-08-09 DIAGNOSIS — G8929 Other chronic pain: Secondary | ICD-10-CM | POA: Diagnosis not present

## 2017-08-09 DIAGNOSIS — M5442 Lumbago with sciatica, left side: Secondary | ICD-10-CM | POA: Diagnosis not present

## 2017-08-09 DIAGNOSIS — M25511 Pain in right shoulder: Secondary | ICD-10-CM | POA: Diagnosis not present

## 2017-08-09 MED ORDER — PREDNISONE 10 MG PO TABS: 20.0000 mg | ORAL_TABLET | Freq: Every day | ORAL | 3 refills | Status: DC

## 2017-08-09 NOTE — Progress Notes (Signed)
Office Visit Note   Patient: Christian [name]           Date of Birth: [birth year]           MRN: [id number] Visit Date: 08/09/2017              Requested by: Wenda Low, MD [address] [place], [place] [address] PCP: Wenda Low, MD  Chief Complaint  Patient presents with  . Lower Back - Pain  . Right Shoulder - Pain  . Left Shoulder - Pain  . Right Wrist - Pain      HPI: Patient is a 76 year old gentleman with multiple medical conditions he complains of bilateral shoulder pain from chronic rotator cuff pathology and osteoarthritis he complains of right wrist pain lower back pain he states that currently is midline and getting more painful he states he can only stand for about 15 minutes at a time he states that he is fallen several times secondary to loss of balance currently ambulating with a cane  Assessment & Plan: Visit Diagnoses:  1. Chronic left-sided low back pain with left-sided sciatica   2. Chronic pain of both shoulders   3. Pain in right wrist     Plan: We will try him on a low-dose prednisone.  He will take 20 mg a day with breakfast recommended once his symptoms improved to decrease to 10 mg and then wean off.  Patient states he would like to follow-up as needed.  Patient is seen with a sign language interpreter  Follow-Up Instructions: Return if symptoms worsen or fail to improve.   Ortho Exam  Patient is alert, oriented, no adenopathy, well-dressed, normal affect, normal respiratory effort. Examination patient has an antalgic gait he is double upright brace on the left.  He has destructive arthritic changes throughout the PIP and DIP joints of his hand he has extension of the wrist with destructive changes across the wrist he does have a lower back pain but he has a negative sciatic tension sign.  He has no acute focal motor weakness.  Examination of both shoulders he only has about 45 degrees of abduction and flexion active range of  motion there is pain with range of motion of both shoulders.  There is crepitation with range of motion of both shoulders.  Imaging: No results found. No images are attached to the encounter.  Labs: Lab Results  Component Value Date   HGBA1C (H) 12/04/2009    10.5 (NOTE) The ADA recommends the following therapeutic goal for glycemic control related to Hgb A1c measurement: Goal of therapy: <6.5 Hgb A1c  Reference: American Diabetes Association: Clinical Practice Recommendations 2010, Diabetes Care, 2010, 33: (Suppl  1).   REPTSTATUS 12/07/2010 FINAL 10/21/2010   REPTSTATUS 10/24/2010 FINAL 10/21/2010   REPTSTATUS 10/26/2010 FINAL 10/21/2010   GRAMSTAIN  10/21/2010    NO WBC SEEN NO SQUAMOUS EPITHELIAL CELLS SEEN NO ORGANISMS SEEN   GRAMSTAIN  10/21/2010    NO WBC SEEN NO SQUAMOUS EPITHELIAL CELLS SEEN NO ORGANISMS SEEN   CULT NO ACID FAST BACILLI ISOLATED IN 6 WEEKS 10/21/2010   CULT  10/21/2010    FEW STAPHYLOCOCCUS AUREUS Note: RIFAMPIN AND GENTAMICIN SHOULD NOT BE USED AS SINGLE DRUGS FOR TREATMENT OF STAPH INFECTIONS. DOXYCLINE SENSITIVE   CULT NO ANAEROBES ISOLATED 10/21/2010   LABORGA STAPHYLOCOCCUS AUREUS 10/21/2010    @LABSALLVALUES (HGBA1)@  @BMI1 @  Orders:  No orders of the defined types were placed in this encounter.  No orders of  the defined types were placed in this encounter.    Procedures: No procedures performed  Clinical Data: No additional findings.  ROS:  All other systems negative, except as noted in the HPI. Review of Systems  Objective: Vital Signs: There were no vitals taken for this visit.  Specialty Comments:  No specialty comments available.  PMFS History: Patient Active Problem List   Diagnosis Date Noted  . Chronic pain of both shoulders 08/09/2017  . Pain in right wrist 08/09/2017  . Atherosclerosis of renal artery ([place]) 04/24/2017  . Type 2 diabetes mellitus without complications ([place]) 17/61/6073  . Chronic left-sided low  back pain with left-sided sciatica 07/24/2016  . Lower extremity edema 01/28/2015  . Angina pectoris ([place]) 07/08/2014  . Anemia, unspecified 05/06/2014  . Acute gastric ulcer 08/27/2013  . 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 IDA (iron deficiency anemia) 08/25/2013  . Mixed hyperlipidemia 08/13/2013  . Essential hypertension, benign 08/13/2013   Past Medical History:  Diagnosis Date  . Angina pectoris ([place])    Nuclear stress test 8/18: EF 59, fixed inf-lat defect likely attenuation, no ischemia, Low Risk  . Arthritis   . CMT (Charcot-Marie-Tooth disease)   . DM (diabetes mellitus) ([place])   . HLD (hyperlipidemia)   . HTN (hypertension)   . Overactive bladder   . Ulcer     Family History  Problem Relation Age of Onset  . Cervical cancer Mother        mets  . Breast cancer Sister        mets  . Diabetes Sister   . Diabetes Brother   . Heart disease Sister   . Cervical cancer Sister     Past Surgical History:  Procedure Laterality Date  . COLONOSCOPY N/A 08/27/2013   Procedure: COLONOSCOPY;  Surgeon: Jerene Bears, MD;  Location: [facility];  Service: Gastroenterology;  Laterality: N/A;  . ESOPHAGOGASTRODUODENOSCOPY N/A 08/27/2013   Procedure: ESOPHAGOGASTRODUODENOSCOPY (EGD);  Surgeon: Jerene Bears, MD;  Location: [facility];  Service: Gastroenterology;  Laterality: N/A;  . FOOT AMPUTATION THROUGH METATARSAL Left   . FOOT SURGERY     age 45  . TOTAL HIP ARTHROPLASTY Bilateral 2002, 2003  . TOTAL KNEE ARTHROPLASTY Bilateral 1999   Social History   Occupational History  . Occupation: retired Personal assistant  Tobacco Use  . Smoking status: Never Smoker  . Smokeless tobacco: Never Used  Substance and Sexual Activity  . Alcohol use: No  . Drug use: No  . Sexual activity: Not on file

## 2017-08-22 ENCOUNTER — Telehealth (INDEPENDENT_AMBULATORY_CARE_PROVIDER_SITE_OTHER): Payer: Self-pay | Admitting: Orthopedic Surgery

## 2017-08-22 NOTE — Telephone Encounter (Signed)
Patient called asking for a [place] plate faxed to [phone].

## 2017-08-24 NOTE — Telephone Encounter (Signed)
Faxed with demographics to [place].

## 2017-10-19 ENCOUNTER — Telehealth: Payer: Self-pay | Admitting: Internal Medicine

## 2017-10-19 ENCOUNTER — Other Ambulatory Visit: Payer: Self-pay | Admitting: Interventional Cardiology

## 2017-10-19 NOTE — Telephone Encounter (Signed)
Left voicemail through sign language interpreter that we are unable to fill pantoprazole as we have not seen him in 3 years (it also appears patient saw another provider in 2017 in the interm of seeing Dr Hilarie Fredrickson) but that if appropriate when we see him at his upcoming visit, we can provide him refills at that time.

## 2017-11-14 ENCOUNTER — Telehealth: Payer: Self-pay | Admitting: Internal Medicine

## 2017-11-14 NOTE — Telephone Encounter (Signed)
Left sign language assisted voicemail advising that we cannot refill pantoprazole until patient is actually seen in the office in April as we have not seen patient in over 3 years. Patient may call back with any questions.

## 2017-11-21 ENCOUNTER — Ambulatory Visit (INDEPENDENT_AMBULATORY_CARE_PROVIDER_SITE_OTHER): Payer: Medicare Other | Admitting: Orthopedic Surgery

## 2017-11-22 ENCOUNTER — Ambulatory Visit (INDEPENDENT_AMBULATORY_CARE_PROVIDER_SITE_OTHER): Payer: Medicare Other | Admitting: Orthopedic Surgery

## 2017-11-22 ENCOUNTER — Encounter (INDEPENDENT_AMBULATORY_CARE_PROVIDER_SITE_OTHER): Payer: Self-pay | Admitting: Orthopedic Surgery

## 2017-11-22 VITALS — Ht 70.0 in | Wt 206.0 lb

## 2017-11-22 DIAGNOSIS — M25511 Pain in right shoulder: Secondary | ICD-10-CM

## 2017-11-22 DIAGNOSIS — G8929 Other chronic pain: Secondary | ICD-10-CM

## 2017-11-22 DIAGNOSIS — M25512 Pain in left shoulder: Secondary | ICD-10-CM

## 2017-11-22 NOTE — Progress Notes (Signed)
Office Visit Note   Patient: Christian [name]           Date of Birth: 1941/08/09           MRN: [id number] Visit Date: 11/22/2017              Requested by: Wenda Low, MD [address] [place], [place] [address] PCP: Wenda Low, MD  Chief Complaint  Patient presents with  . Right Shoulder - Pain  . Left Shoulder - Pain  . Lower Back - Pain    S/p ESI with Dr. Ernestina Patches 08/07/16      HPI: Patient is a 77 year old gentleman who presents with chronic bilateral shoulder pain as well as lower back pain.  Patient requests Percocet to help with his back pain.  He states he works in his yard a lot and he states the pain medicine should help.  He has had epidural steroid injections with Dr. Ernestina Patches however at this time he denies any radicular symptoms.  Patient states he has pain in both shoulders which radiates into his back.  Patient states he is taken prednisone 20 mg in the morning and this does help with some of his symptoms but does not help with his back pain.  Assessment & Plan: Visit Diagnoses:  1. Chronic pain of both shoulders     Plan: The both shoulders were injected from the posterior portal he tolerated this well recommended against Percocet for chronic lower back pain.  Recommended heat and weight loss.  Follow-up as needed.  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    Follow-Up Instructions: Return if symptoms worsen or fail to improve.   Ortho Exam  Patient is alert, oriented, no adenopathy, well-dressed, normal affect, normal respiratory effort. On examination patient has abduction and flexion of both shoulders to about 70 degrees.  He has pain with Neer and Hawkins impingement test bilaterally.  Patient has no radicular symptoms in either lower extremity.  He has multiple scratches on his legs and arms.  No focal motor weakness.  Imaging: No results found. No images are attached to the encounter.  Labs: Lab Results  Component Value Date   HGBA1C  (H) 12/04/2009    10.5 (NOTE) The ADA recommends the following therapeutic goal for glycemic control related to Hgb A1c measurement: Goal of therapy: <6.5 Hgb A1c  Reference: American Diabetes Association: Clinical Practice Recommendations 2010, Diabetes Care, 2010, 33: (Suppl  1).   REPTSTATUS 12/07/2010 FINAL 10/21/2010   REPTSTATUS 10/24/2010 FINAL 10/21/2010   REPTSTATUS 10/26/2010 FINAL 10/21/2010   GRAMSTAIN  10/21/2010    NO WBC SEEN NO SQUAMOUS EPITHELIAL CELLS SEEN NO ORGANISMS SEEN   GRAMSTAIN  10/21/2010    NO WBC SEEN NO SQUAMOUS EPITHELIAL CELLS SEEN NO ORGANISMS SEEN   CULT NO ACID FAST BACILLI ISOLATED IN 6 WEEKS 10/21/2010   CULT  10/21/2010    FEW STAPHYLOCOCCUS AUREUS Note: RIFAMPIN AND GENTAMICIN SHOULD NOT BE USED AS SINGLE DRUGS FOR TREATMENT OF STAPH INFECTIONS. DOXYCLINE SENSITIVE   CULT NO ANAEROBES ISOLATED 10/21/2010   LABORGA STAPHYLOCOCCUS AUREUS 10/21/2010    @LABSALLVALUES (HGBA1)@  Body mass index is 29.56 kg/m.  Orders:  No orders of the defined types were placed in this encounter.  No orders of the defined types were placed in this encounter.    Procedures: Large Joint Inj: bilateral subacromial bursa on 11/22/2017 1:35 PM Indications: diagnostic evaluation and pain Details: 22 G 1.5 in needle, posterior approach  Arthrogram: No  Outcome: tolerated well, no immediate complications Procedure, treatment alternatives, risks and benefits explained, specific risks discussed. Consent was given by the patient. Immediately prior to procedure a time out was called to verify the correct patient, procedure, equipment, support staff and site/side marked as required. Patient was prepped and draped in the usual sterile fashion.      Clinical Data: No additional findings.  ROS:  All other systems negative, except as noted in the HPI. Review of Systems  Objective: Vital Signs: Ht 5\' 10"  (1.778 m)   Wt 206 lb (93.4 kg)   BMI 29.56 kg/m    Specialty Comments:  No specialty comments available.  PMFS History: Patient Active Problem List   Diagnosis Date Noted  . Chronic pain of both shoulders 08/09/2017  . Pain in right wrist 08/09/2017  . 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 Type 2 diabetes mellitus without complications ([place]) 01/65/5374  . Chronic left-sided low back pain with left-sided sciatica 07/24/2016  . Lower extremity edema 01/28/2015  . Angina pectoris ([place]) 07/08/2014  . Anemia, unspecified 05/06/2014  . Acute gastric ulcer 08/27/2013  . 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 IDA (iron deficiency anemia) 08/25/2013  . Mixed hyperlipidemia 08/13/2013  . Essential hypertension, benign 08/13/2013   Past Medical History:  Diagnosis Date  . Angina pectoris ([place])    Nuclear stress test 8/18: EF 59, fixed inf-lat defect likely attenuation, no ischemia, Low Risk  . Arthritis   . CMT (Charcot-Marie-Tooth disease)   . DM (diabetes mellitus) ([place])   . HLD (hyperlipidemia)   . HTN (hypertension)   . Overactive bladder   . Ulcer     Family History  Problem Relation Age of Onset  . Cervical cancer Mother        mets  . Breast cancer Sister        mets  . Diabetes Sister   . Diabetes Brother   . Heart disease Sister   . Cervical cancer Sister     Past Surgical History:  Procedure Laterality Date  . COLONOSCOPY N/A 08/27/2013   Procedure: COLONOSCOPY;  Surgeon: Jerene Bears, MD;  [facility];  Service: Gastroenterology;  Laterality: N/A;  . ESOPHAGOGASTRODUODENOSCOPY N/A 08/27/2013   Procedure: ESOPHAGOGASTRODUODENOSCOPY (EGD);  Surgeon: Jerene Bears, MD;  Location: [facility];  Service: Gastroenterology;  Laterality: N/A;  . FOOT AMPUTATION THROUGH METATARSAL Left   . FOOT SURGERY     age 29  . TOTAL HIP ARTHROPLASTY Bilateral 2002, 2003  . TOTAL KNEE ARTHROPLASTY Bilateral 1999   Social History   Occupational History  . Occupation: retired Personal assistant  Tobacco Use  .  Smoking status: Never Smoker  . Smokeless tobacco: Never Used  Substance and Sexual Activity  . Alcohol use: No  . Drug use: No  . Sexual activity: Not on file

## 2017-12-17 ENCOUNTER — Encounter: Payer: Self-pay | Admitting: *Deleted

## 2017-12-27 ENCOUNTER — Ambulatory Visit: Payer: Medicare Other | Admitting: Internal Medicine

## 2017-12-27 ENCOUNTER — Encounter: Payer: Self-pay | Admitting: Internal Medicine

## 2017-12-27 VITALS — BP 136/80 | HR 84 | Ht 68.0 in | Wt 230.0 lb

## 2017-12-27 DIAGNOSIS — Z8711 Personal history of peptic ulcer disease: Secondary | ICD-10-CM

## 2017-12-27 DIAGNOSIS — Z791 Long term (current) use of non-steroidal anti-inflammatories (NSAID): Secondary | ICD-10-CM | POA: Diagnosis not present

## 2017-12-27 DIAGNOSIS — Z8719 Personal history of other diseases of the digestive system: Secondary | ICD-10-CM

## 2017-12-27 DIAGNOSIS — Z8639 Personal history of other endocrine, nutritional and metabolic disease: Secondary | ICD-10-CM | POA: Diagnosis not present

## 2017-12-27 DIAGNOSIS — Z1211 Encounter for screening for malignant neoplasm of colon: Secondary | ICD-10-CM | POA: Diagnosis not present

## 2017-12-27 MED ORDER — PANTOPRAZOLE SODIUM 40 MG PO TBEC: 40.0000 mg | DELAYED_RELEASE_TABLET | Freq: Every day | ORAL | 2 refills | Status: DC

## 2017-12-27 NOTE — Patient Instructions (Signed)
We have sent the following medications to your pharmacy for you to pick up at your convenience: Pantoprazole 40 mg daily  Your provider has ordered Cologuard testing as an option for colon cancer screening. This is performed by [employer] and may be out of network with your insurance. PRIOR to completing the test, it is YOUR responsibility to contact your insurance about covered benefits for this test. Your out of pocket expense could be anywhere from $0.00 to $649.00.   When you call to check coverage with your insurer, please provide the following information:   -The ONLY provider of Cologuard is [place] code for Cologuard is [phone].  [employer] NPI # [id number]  -[employer] Tax ID # [id number]   We have already sent your demographic and insurance information to [employer] [facility] (phone number [phone]) and they should contact you within the next week regarding your test. If you have not heard from them within the next week, please call our office at [phone].   If you are age 80 or older, your body mass index should be between 23-30. Your Body mass index is 34.97 kg/m. If this is out of the aforementioned range listed, please consider follow up with your Primary Care Provider.  If you are age 51 or younger, your body mass index should be between 19-25. Your Body mass index is 34.97 kg/m. If this is out of the aformentioned range listed, please consider follow up with your Primary Care Provider.

## 2017-12-28 ENCOUNTER — Encounter: Payer: Self-pay | Admitting: Internal Medicine

## 2017-12-28 NOTE — Progress Notes (Signed)
Patient ID: Christian [name], male   DOB: 29-Aug-1941, 77 y.o.   MRN: [id number] HPI: Christian [name] is a 77 year old male with a past medical history of iron deficiency anemia found to be due to gastric ulcer disease in 2015, history of Charcot-Marie-Tooth disease, arthritis, diabetes, hypertension, hyperlipidemia who is seen in follow-up.  He has not been seen in our office since April 2015 and presents today with his wife and a sign language interpreter.  He reports he has been feeling well and wishes to discuss whether or not he needs pantoprazole.  He has remained on this regularly since being diagnosed with gastric ulcer disease and 2015.  This did lead to an iron deficiency and was felt secondary to NSAIDs.  For his arthritis he has continued diclofenac 75 mg twice daily.  Today he denies GI complaint.  Denies heartburn, dysphagia, nausea, vomiting.  Denies abdominal pain.  Denies bowel habit change including diarrhea and constipation.  He will occasionally use MiraLAX may be 1 or 2 times per month if he feels his stools are "too hard".  He denies seeing red blood per rectum, blood in his stool and melena.  On review of records it appears that he was seen in [facility] gastroenterology in November 2017 for abdominal pain.  He does not recall this visit but per this record he was felt to have a functional type abdominal pain at that time.  He had an upper endoscopy which diagnosed the ulcer disease in 2015 but also a colonoscopy and video capsule endoscopy.  The colonoscopy was incomplete and that the base of the cecum was not seen due to tortuosity.  There was moderate diverticulosis in the ascending colon, descending colon and sigmoid.  I had recommended barium enema or CT colonoscopy due to incomplete colonoscopy but he preferred Cologuard.  I ordered Cologuard but this was never returned.  There is also  Report in the chart about possible blood per rectum.  We discussed this and sometime ago he had  bright red blood with wiping only after hard stool which she attributes to hemorrhoids.  He reports that no further bleeding.  Past Medical History:  Diagnosis Date  . Angina pectoris ([place])    Nuclear stress test 8/18: EF 59, fixed inf-lat defect likely attenuation, no ischemia, Low Risk  . Arthritis   . CMT (Charcot-Marie-Tooth disease)   . Deafness   . Diverticulosis   . DM (diabetes mellitus) ([place])   . HLD (hyperlipidemia)   . HTN (hypertension)   . Iron deficiency anemia   . Multiple gastric ulcers   . Overactive bladder   . Ulcer     Past Surgical History:  Procedure Laterality Date  . COLONOSCOPY N/A 08/27/2013   Procedure: COLONOSCOPY;  Surgeon: Jerene Bears, MD;  [facility];  Service: Gastroenterology;  Laterality: N/A;  . ESOPHAGOGASTRODUODENOSCOPY N/A 08/27/2013   Procedure: ESOPHAGOGASTRODUODENOSCOPY (EGD);  Surgeon: Jerene Bears, MD;  Location: [facility];  Service: Gastroenterology;  Laterality: N/A;  . FOOT AMPUTATION THROUGH METATARSAL Left   . FOOT SURGERY     age 16  . TOTAL HIP ARTHROPLASTY Bilateral 2002, 2003  . TOTAL KNEE ARTHROPLASTY Bilateral 1999    Outpatient Medications Prior to Visit  Medication Sig Dispense Refill  . Cholecalciferol (VITAMIN D) 1000 UNITS capsule Take 1,000 Units by mouth 3 (three) times a week. Monday, Wednesday, Saturday    . cyclobenzaprine (FLEXERIL) 10 MG tablet Take 10 mg by mouth 3 (three) times daily as  needed for muscle spasms.     . diclofenac (VOLTAREN) 75 MG EC tablet Take 75 mg by mouth 2 (two) times daily as needed for pain.    Marland Kitchen dicyclomine (BENTYL) 20 MG tablet Take 1 tablet by mouth daily.    . fexofenadine (ALLEGRA) 180 MG tablet Take 180 mg by mouth daily as needed for allergies.     . GuaiFENesin (MUCINEX PO) Take 1 tablet by mouth daily as needed (to loosen phlegm).    . insulin glargine (LANTUS) 100 units/mL SOLN Inject 10 Units into the skin at bedtime.     Marland Kitchen LORazepam (ATIVAN) 2 MG tablet Take  2 mg by mouth at bedtime. Take one half tablet by mouth in the morning and one half tablet by mouth in the evening     . metFORMIN (GLUCOPHAGE) 1000 MG tablet Take 1,000 mg by mouth 2 (two) times daily with a meal.    . nitroGLYCERIN (NITROSTAT) 0.4 MG SL tablet PLACE 1 TABLET (0.4 MG TOTAL) UNDER THE TONGUE EVERY 5 (FIVE) MINUTES AS NEEDED FOR CHEST PAIN. 25 tablet 4  . pioglitazone (ACTOS) 30 MG tablet Take 30 mg by mouth daily.    . polyethylene glycol (MIRALAX / GLYCOLAX) packet Take 17 g by mouth 2 (two) times daily as needed (constipation).     . predniSONE (DELTASONE) 10 MG tablet Take 2 tablets (20 mg total) by mouth daily with breakfast. 60 tablet 3  . rosuvastatin (CRESTOR) 10 MG tablet Take 10 mg by mouth at bedtime.     . traMADol (ULTRAM) 50 MG tablet Take 1-2 tablet up to 3 times daly as needed for pain 696 tablet 0  . TRULICITY 2.95 MW/4.1LK SOPN Inject 1 Dose into the skin once a week.    Marland Kitchen amLODipine (NORVASC) 5 MG tablet Take 1 tablet (5 mg total) by mouth daily. 90 tablet 2  . isosorbide mononitrate (IMDUR) 60 MG 24 hr tablet Take 1 tablet (60 mg total) by mouth daily. 90 tablet 3   No facility-administered medications prior to visit.     Allergies  Allergen Reactions  . Lipitor [Atorvastatin] Other (See Comments)    Family History  Problem Relation Age of Onset  . Cervical cancer Mother        mets  . Breast cancer Sister        mets  . Diabetes Sister   . Diabetes Brother   . Heart disease Sister   . Cervical cancer Sister     Social History   Tobacco Use  . Smoking status: Never Smoker  . Smokeless tobacco: Never Used  Substance Use Topics  . Alcohol use: No  . Drug use: No    ROS: As per history of present illness, otherwise negative  BP 136/80 (Cuff Size: Normal)   Pulse 84   Ht 5\' 8"  (1.727 m)   Wt 230 lb (104.3 kg)   BMI 34.97 kg/m  Constitutional: Well-developed and well-nourished. No distress. HEENT: Normocephalic and atraumatic.   conjunctivae are normal.  No scleral icterus. Neck: Neck supple. Trachea midline. Cardiovascular: Normal rate, regular rhythm and intact distal pulses. No M/R/G Pulmonary/chest: Effort normal and breath sounds normal. No wheezing, rales or rhonchi. Abdominal: Soft, nontender, nondistended. Bowel sounds active throughout.  Extremities: no clubbing, cyanosis, or edema, changes of arthritis bilateral hands Neurological: Alert and oriented to person place and time. Skin: Skin is warm and dry.  Psychiatric: Normal mood and affect. Behavior is normal.  RELEVANT LABS AND IMAGING: CBC  Component Value Date/Time   WBC 8.8 04/24/2017 1704   WBC 7.6 05/06/2014 1007   RBC 4.49 04/24/2017 1704   RBC 4.44 05/06/2014 1007   HGB 13.1 04/24/2017 1704   HCT 40.0 04/24/2017 1704   PLT 301 04/24/2017 1704   MCV 89 04/24/2017 1704   MCH 29.2 04/24/2017 1704   MCH 27.5 08/13/2013 1330   MCHC 32.8 04/24/2017 1704   MCHC 32.9 05/06/2014 1007   RDW 14.4 04/24/2017 1704   LYMPHSABS 2.7 08/13/2013 1330   MONOABS 0.7 08/13/2013 1330   EOSABS 0.2 08/13/2013 1330   BASOSABS 0.0 08/13/2013 1330    CMP     Component Value Date/Time   NA 136 05/01/2017 1032   K 4.9 05/01/2017 1032   CL 103 05/01/2017 1032   CO2 17 (L) 05/01/2017 1032   GLUCOSE 106 (H) 05/01/2017 1032   GLUCOSE 89 05/06/2014 1007   BUN 35 (H) 05/01/2017 1032   CREATININE 1.29 (H) 05/01/2017 1032   CREATININE 1.08 08/13/2013 1330   CALCIUM 8.9 05/01/2017 1032   PROT 7.1 12/03/2009 1122   ALBUMIN 3.3 (L) 12/03/2009 1122   AST 24 12/03/2009 1122   ALT 25 12/03/2009 1122   ALKPHOS 192 (H) 12/03/2009 1122   BILITOT 0.6 12/03/2009 1122   GFRNONAA 55 (L) 05/01/2017 1032   GFRAA 63 05/01/2017 1032    ASSESSMENT/PLAN: 77 year old male with a past medical history of iron deficiency anemia found to be due to gastric ulcer disease in 2015, history of Charcot-Marie-Tooth disease, arthritis, diabetes, hypertension, hyperlipidemia who is  seen in follow-up.  1.  History of gastric ulcers/ongoing NSAID use --we had a discussion regarding continue pantoprazole therapy.  He is not having significant GERD or peptic/dyspeptic symptoms.  However, given daily ongoing NSAID use and his history of NSAID induced gastric ulcers which led to an iron deficiency anemia, I recommended he continue pantoprazole 40 mg once daily.  We discussed the risk, benefits and alternatives to this medication and after our discussion he understands and wishes to continue pantoprazole on a daily basis.  2.  history of incomplete colonoscopy --his colonoscopy was incomplete but only the base of the cecum was not visualized.  There were no other polyps or neoplasia found in the remaining colon.  We discussed additional screening based on incomplete colonoscopy and after our discussion he prefers to try Cologuard.  We reviewed this test again together.  He understands that if this is positive he would need a colonoscopy and should this be the case he would be willing to proceed with colonoscopy.  He can return to the office every 1-2 years and as needed    PN:TIRWER, [place], [place] [address] [place], [place] [id number]

## 2018-01-10 ENCOUNTER — Encounter (INDEPENDENT_AMBULATORY_CARE_PROVIDER_SITE_OTHER): Payer: Self-pay | Admitting: Orthopedic Surgery

## 2018-01-10 ENCOUNTER — Ambulatory Visit (INDEPENDENT_AMBULATORY_CARE_PROVIDER_SITE_OTHER): Payer: Medicare Other | Admitting: Orthopedic Surgery

## 2018-01-10 VITALS — Ht 68.0 in | Wt 230.0 lb

## 2018-01-10 DIAGNOSIS — I87332 Chronic venous hypertension (idiopathic) with ulcer and inflammation of left lower extremity: Secondary | ICD-10-CM

## 2018-01-10 DIAGNOSIS — L97521 Non-pressure chronic ulcer of other part of left foot limited to breakdown of skin: Secondary | ICD-10-CM | POA: Diagnosis not present

## 2018-01-10 DIAGNOSIS — L97929 Non-pressure chronic ulcer of unspecified part of left lower leg with unspecified severity: Principal | ICD-10-CM

## 2018-01-10 NOTE — Progress Notes (Signed)
Office Visit Note   Patient: Christian [name]           Date of Birth: August 17, 1941           MRN: [id number] Visit Date: 01/10/2018              Requested by: Wenda Low, MD [address] [place], [place] [address] PCP: Wenda Low, MD  Chief Complaint  Patient presents with  . Left Foot - Wound Check      HPI: Patient is a 77 year old gentleman left transmetatarsal amputation with venous insufficiency with traumatic venous ulcers to the left leg.  Patient states developed an ulcer after mowing his yard.  Patient states he has been very active around the house states his wife had total knee replacement she went to skilled nursing for postoperative care and fell and broke her hip the day she went to skilled nursing.  Assessment & Plan: Visit Diagnoses:  1. 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 Ulcer of toe of left foot, limited to breakdown of skin ([place])     Plan: The ulcer was debrided of skin and soft tissue Iodosorb dressing was applied we will apply Dynaflex wrap to the left leg.  Patient does not have the ability to apply compression stockings or wound care.  Follow-Up Instructions: Return in about 1 week (around 01/17/2018).   Ortho Exam  Patient is alert, oriented, no adenopathy, well-dressed, normal affect, normal respiratory effort. 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  After informed consent a 10 blade knife was used to debride the skin and soft tissue back to healthy viable granulation tissue this was touched with silver nitrate the ulcer is 2 cm in diameter and 5 mm deep there is no exposed bone or tendon no abscess no cellulitis.  Examination patient has pitting edema up to the tibial tubercle with venous insufficiency and traumatic venous stasis ulcers x2.  Imaging: No results found. No images are attached to the  encounter.  Labs: Lab Results  Component Value Date   HGBA1C (H) 12/04/2009    10.5 (NOTE) The ADA recommends the following therapeutic goal for glycemic control related to Hgb A1c measurement: Goal of therapy: <6.5 Hgb A1c  Reference: American Diabetes Association: Clinical Practice Recommendations 2010, Diabetes Care, 2010, 33: (Suppl  1).   REPTSTATUS 12/07/2010 FINAL 10/21/2010   REPTSTATUS 10/24/2010 FINAL 10/21/2010   REPTSTATUS 10/26/2010 FINAL 10/21/2010   GRAMSTAIN  10/21/2010    NO WBC SEEN NO SQUAMOUS EPITHELIAL CELLS SEEN NO ORGANISMS SEEN   GRAMSTAIN  10/21/2010    NO WBC SEEN NO SQUAMOUS EPITHELIAL CELLS SEEN NO ORGANISMS SEEN   CULT NO ACID FAST BACILLI ISOLATED IN 6 WEEKS 10/21/2010   CULT  10/21/2010    FEW STAPHYLOCOCCUS AUREUS Note: RIFAMPIN AND GENTAMICIN SHOULD NOT BE USED AS SINGLE DRUGS FOR TREATMENT OF STAPH INFECTIONS. DOXYCLINE SENSITIVE   CULT NO ANAEROBES ISOLATED 10/21/2010   LABORGA STAPHYLOCOCCUS AUREUS 10/21/2010    Lab Results  Component Value Date/Time   HGBA1C (H) 12/04/2009 05:18 AM    10.5 (NOTE) The ADA recommends the following therapeutic goal for glycemic control related to Hgb A1c measurement: Goal of therapy: <6.5 Hgb A1c  Reference: American Diabetes Association: Clinical Practice Recommendations 2010, Diabetes Care, 2010, 33: (Suppl  1).    Body mass index is 34.97 kg/m.  Orders:  No orders of the defined types were  placed in this encounter.  No orders of the defined types were placed in this encounter.    Procedures: No procedures performed  Clinical Data: No additional findings.  ROS:  All other systems negative, except as noted in the HPI. Review of Systems  Objective: Vital Signs: Ht 5\' 8"  (1.727 m)   Wt 230 lb (104.3 kg)   BMI 34.97 kg/m   Specialty Comments:  No specialty comments available.  PMFS History: Patient Active Problem List   Diagnosis Date Noted  . Chronic pain of both shoulders 08/09/2017   . Pain in right wrist 08/09/2017  . Atherosclerosis of renal artery ([place]) 04/24/2017  . 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 Chronic left-sided low back pain with left-sided sciatica 07/24/2016  . Lower extremity edema 01/28/2015  . Angina pectoris ([place]) 07/08/2014  . Anemia, unspecified 05/06/2014  . Acute gastric ulcer 08/27/2013  . 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 IDA (iron deficiency anemia) 08/25/2013  . Mixed hyperlipidemia 08/13/2013  . Essential hypertension, benign 08/13/2013   Past Medical History:  Diagnosis Date  . Angina pectoris ([place])    Nuclear stress test 8/18: EF 59, fixed inf-lat defect likely attenuation, no ischemia, Low Risk  . Arthritis   . CMT (Charcot-Marie-Tooth disease)   . Deafness   . Diverticulosis   . DM (diabetes mellitus) ([place])   . HLD (hyperlipidemia)   . HTN (hypertension)   . Iron deficiency anemia   . Multiple gastric ulcers   . Overactive bladder   . Ulcer     Family History  Problem Relation Age of Onset  . Cervical cancer Mother        mets  . Breast cancer Sister        mets  . Diabetes Sister   . Diabetes Brother   . Heart disease Sister   . Cervical cancer Sister     Past Surgical History:  Procedure Laterality Date  . COLONOSCOPY N/A 08/27/2013   Procedure: COLONOSCOPY;  Surgeon: Jerene Bears, MD;  Location: [facility];  Service: Gastroenterology;  Laterality: N/A;  . ESOPHAGOGASTRODUODENOSCOPY N/A 08/27/2013   Procedure: ESOPHAGOGASTRODUODENOSCOPY (EGD);  Surgeon: Jerene Bears, MD;  Location: [facility];  Service: Gastroenterology;  Laterality: N/A;  . FOOT AMPUTATION THROUGH METATARSAL Left   . FOOT SURGERY     age 33  . TOTAL HIP ARTHROPLASTY Bilateral 2002, 2003  . TOTAL KNEE ARTHROPLASTY Bilateral 1999   Social History   Occupational History  . Occupation: retired Personal assistant  Tobacco Use  . Smoking status: Never Smoker  . Smokeless tobacco: Never Used  Substance and Sexual  Activity  . Alcohol use: No  . Drug use: No  . Sexual activity: Not on file

## 2018-01-11 ENCOUNTER — Other Ambulatory Visit: Payer: Self-pay | Admitting: Interventional Cardiology

## 2018-01-17 ENCOUNTER — Encounter (INDEPENDENT_AMBULATORY_CARE_PROVIDER_SITE_OTHER): Payer: Self-pay | Admitting: Orthopedic Surgery

## 2018-01-17 ENCOUNTER — Ambulatory Visit (INDEPENDENT_AMBULATORY_CARE_PROVIDER_SITE_OTHER): Payer: Medicare Other | Admitting: Orthopedic Surgery

## 2018-01-17 DIAGNOSIS — L97521 Non-pressure chronic ulcer of other part of left foot limited to breakdown of skin: Secondary | ICD-10-CM | POA: Diagnosis not present

## 2018-01-17 DIAGNOSIS — L97929 Non-pressure chronic ulcer of unspecified part of left lower leg with unspecified severity: Principal | ICD-10-CM

## 2018-01-17 DIAGNOSIS — I87332 Chronic venous hypertension (idiopathic) with ulcer and inflammation of left lower extremity: Secondary | ICD-10-CM

## 2018-01-17 NOTE — Progress Notes (Signed)
Office Visit Note   Patient: Christian [name]           Date of Birth: 18-Jan-1941           MRN: [id number] Visit Date: 01/17/2018              Requested by: Wenda Low, MD [address] [place], [place] [address] PCP: Wenda Low, MD  Chief Complaint  Patient presents with  . Left Foot - Wound Check  . Left Leg - Wound Check      HPI: Patient is a 77 year old gentleman diabetic insensate neuropathy venous insufficiency left lower extremity with venous stasis ulcers with a Waggoner grade 1 ulcer over the plantar aspect of the transmetatarsal amputation.  Patient states that his sugars have been doing much better recently and that they are running in the 80s.  Assessment & Plan: Visit Diagnoses:  1. 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 Ulcer of toe of left foot, limited to breakdown of skin ([place])     Plan: Ulcer was debrided of skin and soft tissue.  Will apply a felt relieving donut apply a Dynaflex wrap to the left leg he is to keep the dressing dry.  Follow-Up Instructions: Return in about 1 week (around 01/24/2018).   Ortho Exam  Patient is alert, oriented, no adenopathy, well-dressed, normal affect, normal respiratory effort. Examination patient has significant decreased swelling in the left leg with the compression wrap.  The skin is wrinkling well the venous ulcers are healing nicely.  He has a Waggoner grade 1 ulcer on the plantar aspect of the left foot.  After informed consent a 10 blade knife was used to debride the skin and soft tissue back to bleeding viable granulation tissue this was touched with silver nitrate the ulcer is 15 mm in diameter 1 mm deep there is no exposed bone or tendon no signs of infection no drainage.  Imaging: No results found. No images are attached to the encounter.  Labs: Lab Results  Component Value Date   HGBA1C (H) 12/04/2009    10.5 (NOTE) The  ADA recommends the following therapeutic goal for glycemic control related to Hgb A1c measurement: Goal of therapy: <6.5 Hgb A1c  Reference: American Diabetes Association: Clinical Practice Recommendations 2010, Diabetes Care, 2010, 33: (Suppl  1).   REPTSTATUS 12/07/2010 FINAL 10/21/2010   REPTSTATUS 10/24/2010 FINAL 10/21/2010   REPTSTATUS 10/26/2010 FINAL 10/21/2010   GRAMSTAIN  10/21/2010    NO WBC SEEN NO SQUAMOUS EPITHELIAL CELLS SEEN NO ORGANISMS SEEN   GRAMSTAIN  10/21/2010    NO WBC SEEN NO SQUAMOUS EPITHELIAL CELLS SEEN NO ORGANISMS SEEN   CULT NO ACID FAST BACILLI ISOLATED IN 6 WEEKS 10/21/2010   CULT  10/21/2010    FEW STAPHYLOCOCCUS AUREUS Note: RIFAMPIN AND GENTAMICIN SHOULD NOT BE USED AS SINGLE DRUGS FOR TREATMENT OF STAPH INFECTIONS. DOXYCLINE SENSITIVE   CULT NO ANAEROBES ISOLATED 10/21/2010   LABORGA STAPHYLOCOCCUS AUREUS 10/21/2010     Lab Results  Component Value Date   ALBUMIN 3.3 (L) 12/03/2009    There is no height or weight on file to calculate BMI.  Orders:  No orders of the defined types were placed in this encounter.  No orders of the defined types were placed in this encounter.    Procedures: No procedures performed  Clinical Data: No additional findings.  ROS:  All other systems negative, except as noted in the HPI.  Review of Systems  Objective: Vital Signs: There were no vitals taken for this visit.  Specialty Comments:  No specialty comments available.  PMFS History: Patient Active Problem List   Diagnosis Date Noted  . Chronic pain of both shoulders 08/09/2017  . Pain in right wrist 08/09/2017  . Atherosclerosis of renal artery ([place]) 04/24/2017  . 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 Chronic left-sided low back pain with left-sided sciatica 07/24/2016  . Lower extremity edema 01/28/2015  . Angina pectoris ([place]) 07/08/2014  . Anemia, unspecified 05/06/2014  . Acute gastric ulcer 08/27/2013  .  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 IDA (iron deficiency anemia) 08/25/2013  . Mixed hyperlipidemia 08/13/2013  . Essential hypertension, benign 08/13/2013   Past Medical History:  Diagnosis Date  . Angina pectoris ([place])    Nuclear stress test 8/18: EF 59, fixed inf-lat defect likely attenuation, no ischemia, Low Risk  . Arthritis   . CMT (Charcot-Marie-Tooth disease)   . Deafness   . Diverticulosis   . DM (diabetes mellitus) ([place])   . HLD (hyperlipidemia)   . HTN (hypertension)   . Iron deficiency anemia   . Multiple gastric ulcers   . Overactive bladder   . Ulcer     Family History  Problem Relation Age of Onset  . Cervical cancer Mother        mets  . Breast cancer Sister        mets  . Diabetes Sister   . Diabetes Brother   . Heart disease Sister   . Cervical cancer Sister     Past Surgical History:  Procedure Laterality Date  . COLONOSCOPY N/A 08/27/2013   Procedure: COLONOSCOPY;  Surgeon: Jerene Bears, MD;  [facility];  Service: Gastroenterology;  Laterality: N/A;  . ESOPHAGOGASTRODUODENOSCOPY N/A 08/27/2013   Procedure: ESOPHAGOGASTRODUODENOSCOPY (EGD);  Surgeon: Jerene Bears, MD;  Location: [facility];  Service: Gastroenterology;  Laterality: N/A;  . FOOT AMPUTATION THROUGH METATARSAL Left   . FOOT SURGERY     age 86  . TOTAL HIP ARTHROPLASTY Bilateral 2002, 2003  . TOTAL KNEE ARTHROPLASTY Bilateral 1999   Social History   Occupational History  . Occupation: retired Personal assistant  Tobacco Use  . Smoking status: Never Smoker  . Smokeless tobacco: Never Used  Substance and Sexual Activity  . Alcohol use: No  . Drug use: No  . Sexual activity: Not on file

## 2018-01-28 ENCOUNTER — Ambulatory Visit (INDEPENDENT_AMBULATORY_CARE_PROVIDER_SITE_OTHER): Payer: Medicare Other | Admitting: Orthopedic Surgery

## 2018-01-28 ENCOUNTER — Encounter (INDEPENDENT_AMBULATORY_CARE_PROVIDER_SITE_OTHER): Payer: Self-pay | Admitting: Orthopedic Surgery

## 2018-01-28 DIAGNOSIS — I87332 Chronic venous hypertension (idiopathic) with ulcer and inflammation of left lower extremity: Secondary | ICD-10-CM | POA: Diagnosis not present

## 2018-01-28 DIAGNOSIS — L97929 Non-pressure chronic ulcer of unspecified part of left lower leg with unspecified severity: Secondary | ICD-10-CM

## 2018-01-28 DIAGNOSIS — L97521 Non-pressure chronic ulcer of other part of left foot limited to breakdown of skin: Secondary | ICD-10-CM

## 2018-01-28 NOTE — Progress Notes (Signed)
Office Visit Note   Patient: Christian [name]           Date of Birth: Jun 25, 1941           MRN: [id number] Visit Date: 01/28/2018              Requested by: Wenda Low, MD [address] [place], [place] [address] PCP: Wenda Low, MD  Chief Complaint  Patient presents with  . Left Foot - Follow-up, Wound Check, Pain      HPI: Patient is a 77 year old gentleman transmetatarsal amputation the left who developed an ulcer on the midfoot left foot as well as ulceration from venous insufficiency.  Patient has been in a compression wrap.  Assessment & Plan: Visit Diagnoses:  1. 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 Ulcer of toe of left foot, limited to breakdown of skin ([place])     Plan: We will plan to replace a felt relieving donut to unload pressure from the ulcer a silver alginate dressing over the wound a Dynaflex wrap for the leg follow-up in 1 week.  Follow-Up Instructions: Return in about 1 week (around 02/04/2018).   Ortho Exam  Patient is alert, oriented, no adenopathy, well-dressed, normal affect, normal respiratory effort. Examination patient has brawny skin color changes from the venous insufficiency left leg the venous ulcers are healing nicely.  Patient has a persistent Waggoner grade 1 ulcer with healthy granulation tissue the base this does not probe to bone there is no cellulitis.  The ulcer is 10 x 13 mm and 3 mm deep.  Imaging: No results found. No images are attached to the encounter.  Labs: Lab Results  Component Value Date   HGBA1C (H) 12/04/2009    10.5 (NOTE) The ADA recommends the following therapeutic goal for glycemic control related to Hgb A1c measurement: Goal of therapy: <6.5 Hgb A1c  Reference: American Diabetes Association: Clinical Practice Recommendations 2010, Diabetes Care, 2010, 33: (Suppl  1).   REPTSTATUS 12/07/2010 FINAL 10/21/2010   REPTSTATUS  10/24/2010 FINAL 10/21/2010   REPTSTATUS 10/26/2010 FINAL 10/21/2010   GRAMSTAIN  10/21/2010    NO WBC SEEN NO SQUAMOUS EPITHELIAL CELLS SEEN NO ORGANISMS SEEN   GRAMSTAIN  10/21/2010    NO WBC SEEN NO SQUAMOUS EPITHELIAL CELLS SEEN NO ORGANISMS SEEN   CULT NO ACID FAST BACILLI ISOLATED IN 6 WEEKS 10/21/2010   CULT  10/21/2010    FEW STAPHYLOCOCCUS AUREUS Note: RIFAMPIN AND GENTAMICIN SHOULD NOT BE USED AS SINGLE DRUGS FOR TREATMENT OF STAPH INFECTIONS. DOXYCLINE SENSITIVE   CULT NO ANAEROBES ISOLATED 10/21/2010   LABORGA STAPHYLOCOCCUS AUREUS 10/21/2010     Lab Results  Component Value Date   ALBUMIN 3.3 (L) 12/03/2009    There is no height or weight on file to calculate BMI.  Orders:  No orders of the defined types were placed in this encounter.  No orders of the defined types were placed in this encounter.    Procedures: No procedures performed  Clinical Data: No additional findings.  ROS:  All other systems negative, except as noted in the HPI. Review of Systems  Objective: Vital Signs: There were no vitals taken for this visit.  Specialty Comments:  No specialty comments available.  PMFS History: Patient Active Problem List   Diagnosis Date Noted  . Chronic pain of both shoulders 08/09/2017  . Pain in right wrist 08/09/2017  . Atherosclerosis of renal artery ([place]) 04/24/2017  .  Type 2 diabetes mellitus without complications ([place]) 52/77/8242  . Chronic left-sided low back pain with left-sided sciatica 07/24/2016  . Lower extremity edema 01/28/2015  . Angina pectoris ([place]) 07/08/2014  . Anemia, unspecified 05/06/2014  . Acute gastric ulcer 08/27/2013  . 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 IDA (iron deficiency anemia) 08/25/2013  . Mixed hyperlipidemia 08/13/2013  . Essential hypertension, benign 08/13/2013   Past Medical History:  Diagnosis Date  . Angina pectoris ([place])    Nuclear stress test 8/18: EF 59, fixed inf-lat defect likely  attenuation, no ischemia, Low Risk  . Arthritis   . CMT (Charcot-Marie-Tooth disease)   . Deafness   . Diverticulosis   . DM (diabetes mellitus) ([place])   . HLD (hyperlipidemia)   . HTN (hypertension)   . Iron deficiency anemia   . Multiple gastric ulcers   . Overactive bladder   . Ulcer     Family History  Problem Relation Age of Onset  . Cervical cancer Mother        mets  . Breast cancer Sister        mets  . Diabetes Sister   . Diabetes Brother   . Heart disease Sister   . Cervical cancer Sister     Past Surgical History:  Procedure Laterality Date  . COLONOSCOPY N/A 08/27/2013   Procedure: COLONOSCOPY;  Surgeon: Jerene Bears, MD;  Location: [facility];  Service: Gastroenterology;  Laterality: N/A;  . ESOPHAGOGASTRODUODENOSCOPY N/A 08/27/2013   Procedure: ESOPHAGOGASTRODUODENOSCOPY (EGD);  Surgeon: Jerene Bears, MD;  Location: [facility];  Service: Gastroenterology;  Laterality: N/A;  . FOOT AMPUTATION THROUGH METATARSAL Left   . FOOT SURGERY     age 51  . TOTAL HIP ARTHROPLASTY Bilateral 2002, 2003  . TOTAL KNEE ARTHROPLASTY Bilateral 1999   Social History   Occupational History  . Occupation: retired Personal assistant  Tobacco Use  . Smoking status: Never Smoker  . Smokeless tobacco: Never Used  Substance and Sexual Activity  . Alcohol use: No  . Drug use: No  . Sexual activity: Not on file

## 2018-02-06 ENCOUNTER — Encounter (INDEPENDENT_AMBULATORY_CARE_PROVIDER_SITE_OTHER): Payer: Self-pay | Admitting: Family

## 2018-02-06 ENCOUNTER — Ambulatory Visit (INDEPENDENT_AMBULATORY_CARE_PROVIDER_SITE_OTHER): Payer: Medicare Other | Admitting: Family

## 2018-02-06 VITALS — Ht 68.0 in | Wt 230.0 lb

## 2018-02-06 DIAGNOSIS — L97521 Non-pressure chronic ulcer of other part of left foot limited to breakdown of skin: Secondary | ICD-10-CM

## 2018-02-06 DIAGNOSIS — R600 Localized edema: Secondary | ICD-10-CM

## 2018-02-07 ENCOUNTER — Encounter (INDEPENDENT_AMBULATORY_CARE_PROVIDER_SITE_OTHER): Payer: Self-pay | Admitting: Family

## 2018-02-07 DIAGNOSIS — L97521 Non-pressure chronic ulcer of other part of left foot limited to breakdown of skin: Secondary | ICD-10-CM | POA: Insufficient documentation

## 2018-02-07 NOTE — Progress Notes (Signed)
Office Visit Note   Patient: Christian [name]           Date of Birth: 10/08/1940           MRN: [id number] Visit Date: 02/06/2018              Requested by: Wenda Low, MD [address] [place] [address] [place], [place] [address] PCP: Wenda Low, MD  Chief Complaint  Patient presents with  . Left Foot - Follow-up    dynaflex compression wrap left transmet.       HPI: Patient is a 77 year old gentleman transmetatarsal amputation the left who developed an ulcer on the midfoot left foot as well as ulceration from venous insufficiency.  Patient has been in a compression wrap.  The lower extremity ulceration has healed however he continues to have chronic ulceration to his foot.  He is with resolution of swelling.  Today is in a double upright brace on the left.  Does use a doughnut to offload pressure in his shoe.  Assessment & Plan: Visit Diagnoses:  No diagnosis found.  Plan: He will continue with his double upright brace as well as with donuts for pressure relieving.  Daily dressing changes.  Minimize weightbearing on the left ulceration.  We will follow-up in the office in 4 weeks with Dr. Sharol Given.  Advised to call or return sooner for any worsening.  Follow-Up Instructions: No follow-ups on file.   Ortho Exam  Patient is alert, oriented, no adenopathy, well-dressed, normal affect, normal respiratory effort. Examination patient has brawny skin color changes from the venous insufficiency left leg the venous ulcers are well-healed.  Great wrinkling of the skin of the left lower extremity with resolution of edema. patient has a persistent Wagner grade 1 ulcer with healthy granulation tissue the base this does not probe to bone there is no cellulitis.  The ulcer is 10 x 13 mm and 3 mm deep.  There is surrounding tissue this was debrided with a 10 blade knife purulence no surrounding erythema no sign of infection.  Imaging: No results found. No images are attached to the  encounter.  Labs: Lab Results  Component Value Date   HGBA1C (H) 12/04/2009    10.5 (NOTE) The ADA recommends the following therapeutic goal for glycemic control related to Hgb A1c measurement: Goal of therapy: <6.5 Hgb A1c  Reference: American Diabetes Association: Clinical Practice Recommendations 2010, Diabetes Care, 2010, 33: (Suppl  1).   REPTSTATUS 12/07/2010 FINAL 10/21/2010   REPTSTATUS 10/24/2010 FINAL 10/21/2010   REPTSTATUS 10/26/2010 FINAL 10/21/2010   GRAMSTAIN  10/21/2010    NO WBC SEEN NO SQUAMOUS EPITHELIAL CELLS SEEN NO ORGANISMS SEEN   GRAMSTAIN  10/21/2010    NO WBC SEEN NO SQUAMOUS EPITHELIAL CELLS SEEN NO ORGANISMS SEEN   CULT NO ACID FAST BACILLI ISOLATED IN 6 WEEKS 10/21/2010   CULT  10/21/2010    FEW STAPHYLOCOCCUS AUREUS Note: RIFAMPIN AND GENTAMICIN SHOULD NOT BE USED AS SINGLE DRUGS FOR TREATMENT OF STAPH INFECTIONS. DOXYCLINE SENSITIVE   CULT NO ANAEROBES ISOLATED 10/21/2010   LABORGA STAPHYLOCOCCUS AUREUS 10/21/2010     Lab Results  Component Value Date   ALBUMIN 3.3 (L) 12/03/2009    Body mass index is 34.97 kg/m.  Orders:  No orders of the defined types were placed in this encounter.  No orders of the defined types were placed in this encounter.    Procedures: No procedures performed  Clinical Data: No additional findings.  ROS:  All other  systems negative, except as noted in the HPI. Review of Systems  Constitutional: Negative for chills and fever.  Cardiovascular: Positive for leg swelling.    Objective: Vital Signs: Ht 5\' 8"  (1.727 m)   Wt 230 lb (104.3 kg)   BMI 34.97 kg/m   Specialty Comments:  No specialty comments available.  PMFS History: Patient Active Problem List   Diagnosis Date Noted  . Chronic pain of both shoulders 08/09/2017  . Pain in right wrist 08/09/2017  . Atherosclerosis of renal artery ([place]) 04/24/2017  . Type 2 diabetes mellitus without complications ([place]) 62/37/6283  . Chronic left-sided  low back pain with left-sided sciatica 07/24/2016  . Lower extremity edema 01/28/2015  . Angina pectoris ([place]) 07/08/2014  . Anemia, unspecified 05/06/2014  . Acute gastric ulcer 08/27/2013  . 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 IDA (iron deficiency anemia) 08/25/2013  . Mixed hyperlipidemia 08/13/2013  . Essential hypertension, benign 08/13/2013   Past Medical History:  Diagnosis Date  . Angina pectoris ([place])    Nuclear stress test 8/18: EF 59, fixed inf-lat defect likely attenuation, no ischemia, Low Risk  . Arthritis   . CMT (Charcot-Marie-Tooth disease)   . Deafness   . Diverticulosis   . DM (diabetes mellitus) ([place])   . HLD (hyperlipidemia)   . HTN (hypertension)   . Iron deficiency anemia   . Multiple gastric ulcers   . Overactive bladder   . Ulcer     Family History  Problem Relation Age of Onset  . Cervical cancer Mother        mets  . Breast cancer Sister        mets  . Diabetes Sister   . Diabetes Brother   . Heart disease Sister   . Cervical cancer Sister     Past Surgical History:  Procedure Laterality Date  . COLONOSCOPY N/A 08/27/2013   Procedure: COLONOSCOPY;  Surgeon: Jerene Bears, MD;  [facility];  Service: Gastroenterology;  Laterality: N/A;  . ESOPHAGOGASTRODUODENOSCOPY N/A 08/27/2013   Procedure: ESOPHAGOGASTRODUODENOSCOPY (EGD);  Surgeon: Jerene Bears, MD;  Location: [facility];  Service: Gastroenterology;  Laterality: N/A;  . FOOT AMPUTATION THROUGH METATARSAL Left   . FOOT SURGERY     age 15  . TOTAL HIP ARTHROPLASTY Bilateral 2002, 2003  . TOTAL KNEE ARTHROPLASTY Bilateral 1999   Social History   Occupational History  . Occupation: retired Personal assistant  Tobacco Use  . Smoking status: Never Smoker  . Smokeless tobacco: Never Used  Substance and Sexual Activity  . Alcohol use: No  . Drug use: No  . Sexual activity: Not on file

## 2018-02-22 ENCOUNTER — Other Ambulatory Visit: Payer: Self-pay | Admitting: Physician Assistant

## 2018-02-27 ENCOUNTER — Telehealth: Payer: Self-pay | Admitting: *Deleted

## 2018-02-27 NOTE — Telephone Encounter (Signed)
Per [employer], patient has not returned cologaurd kit. I contacted patient who stated at first that he had heard nothing from [facility] although [facility] indicates that they made several attempts to contact patient and that patient told them on 01/23/18 that his wife recently had hip surgery and he was taking care of her so Cologaurd was not a priority at that time but he would do it when he could. He also contacted them on 01/28/18 asking about a [employer] pick up site and that was the last indication of any contact with patient. In addition, they indicate that 3 letters were sent to patient. I called patient back and he states he sent box back 1 month ago. [employer] states [employer] tracking number shows no movement.

## 2018-02-28 NOTE — Telephone Encounter (Signed)
Patient has been advised that Colog[employer]ard has not been received through [employer]. He indicates that he will contact them (I have given him the phone number) and will request a new test.

## 2018-03-07 ENCOUNTER — Ambulatory Visit (INDEPENDENT_AMBULATORY_CARE_PROVIDER_SITE_OTHER): Payer: Medicare Other | Admitting: Orthopedic Surgery

## 2018-03-07 ENCOUNTER — Encounter (INDEPENDENT_AMBULATORY_CARE_PROVIDER_SITE_OTHER): Payer: Self-pay | Admitting: Orthopedic Surgery

## 2018-03-07 DIAGNOSIS — I87332 Chronic venous hypertension (idiopathic) with ulcer and inflammation of left lower extremity: Secondary | ICD-10-CM

## 2018-03-07 DIAGNOSIS — L97929 Non-pressure chronic ulcer of unspecified part of left lower leg with unspecified severity: Secondary | ICD-10-CM

## 2018-03-07 DIAGNOSIS — L97521 Non-pressure chronic ulcer of other part of left foot limited to breakdown of skin: Secondary | ICD-10-CM | POA: Diagnosis not present

## 2018-03-07 MED ORDER — CADEXOMER IODINE 0.9 % EX GEL: CUTANEOUS | Status: DC

## 2018-03-07 NOTE — Progress Notes (Signed)
Office Visit Note   Patient: Christian [name]           Date of Birth: 1940-11-07           MRN: [id number] Visit Date: 03/07/2018              Requested by: Wenda Low, MD [address] [place], [place] [address] PCP: Wenda Low, MD  No chief complaint on file.     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  Patient presents in follow-up for ulceration the plantar aspect the left foot as well as venous stasis ulcers.  Assessment & Plan: Visit Diagnoses:  1. Ulcer of left foot, limited to breakdown of skin ([facility])   2. 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  Recommended minimizing weightbearing for the ulcer to heal discussed that with his current weightbearing and large ulcer he is at increased risk of infection and potential transtibial amputation.  Follow-Up Instructions: Return in about 1 month (around 04/04/2018).   Ortho Exam  Patient is alert, oriented, no adenopathy, well-dressed, normal affect, normal respiratory effort. Examination patient has brawny skin color changes pitting edema and ulceration in the left leg.  He has a large Waggoner grade 1 ulcer to the plantar aspect of the transmetatarsal amputation.  After informed consent a 10 blade knife was used to debride the skin and soft tissue back to bleeding viable granulation tissue.  Silver nitrate was used for hemostasis Iodosorb and gauze was applied.  The ulcer is 3 cm in diameter and 2 mm deep.  A prescription is sent in for Iodosorb.  Imaging: No results found. No images are attached to the encounter.  Labs: Lab Results  Component Value Date   HGBA1C (H) 12/04/2009    10.5 (NOTE) The ADA  recommends the following therapeutic goal for glycemic control related to Hgb A1c measurement: Goal of therapy: <6.5 Hgb A1c  Reference: American Diabetes Association: Clinical Practice Recommendations 2010, Diabetes Care, 2010, 33: (Suppl  1).   REPTSTATUS 12/07/2010 FINAL 10/21/2010   REPTSTATUS 10/24/2010 FINAL 10/21/2010   REPTSTATUS 10/26/2010 FINAL 10/21/2010   GRAMSTAIN  10/21/2010    NO WBC SEEN NO SQUAMOUS EPITHELIAL CELLS SEEN NO ORGANISMS SEEN   GRAMSTAIN  10/21/2010    NO WBC SEEN NO SQUAMOUS EPITHELIAL CELLS SEEN NO ORGANISMS SEEN   CULT NO ACID FAST BACILLI ISOLATED IN 6 WEEKS 10/21/2010   CULT  10/21/2010    FEW STAPHYLOCOCCUS AUREUS Note: RIFAMPIN AND GENTAMICIN SHOULD NOT BE USED AS SINGLE DRUGS FOR TREATMENT OF STAPH INFECTIONS. DOXYCLINE SENSITIVE   CULT NO ANAEROBES ISOLATED 10/21/2010   LABORGA STAPHYLOCOCCUS AUREUS 10/21/2010     Lab Results  Component Value Date   ALBUMIN 3.3 (L) 12/03/2009    There is no height or weight on file to calculate BMI.  Orders:  Orders Placed This Encounter  Procedures  . AMB referral to wound care center   No orders of the defined types were placed in this encounter.    Procedures: No procedures performed  Clinical Data: No additional findings.  ROS:  All other systems negative, except as noted in the HPI. Review of Systems  Objective: Vital Signs: There were no vitals taken for this visit.  Specialty Comments:  No specialty comments available.  PMFS History: Patient Active Problem List   Diagnosis Date Noted  . Ulcer of left foot, limited to breakdown of skin ([facility]) 02/07/2018  . Chronic pain of both shoulders 08/09/2017  . Pain in right wrist 08/09/2017  . Atherosclerosis of renal artery ([place]) 04/24/2017  . Type 2 diabetes mellitus without complications ([place]) 47/82/9562  . Chronic left-sided low back pain with left-sided sciatica 07/24/2016  . Lower extremity edema 01/28/2015  . Angina pectoris ([facility])  07/08/2014  . Anemia, unspecified 05/06/2014  . Acute gastric ulcer 08/27/2013  . 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 IDA (iron deficiency anemia) 08/25/2013  . Mixed hyperlipidemia 08/13/2013  . Essential hypertension, benign 08/13/2013   Past Medical History:  Diagnosis Date  . Angina pectoris ([place])    Nuclear stress test 8/18: EF 59, fixed inf-lat defect likely attenuation, no ischemia, Low Risk  . Arthritis   . CMT (Charcot-Marie-Tooth disease)   . Deafness   . Diverticulosis   . DM (diabetes mellitus) ([place])   . HLD (hyperlipidemia)   . HTN (hypertension)   . Iron deficiency anemia   . Multiple gastric ulcers   . Overactive bladder   . Ulcer     Family History  Problem Relation Age of Onset  . Cervical cancer Mother        mets  . Breast cancer Sister        mets  . Diabetes Sister   . Diabetes Brother   . Heart disease Sister   . Cervical cancer Sister     Past Surgical History:  Procedure Laterality Date  . COLONOSCOPY N/A 08/27/2013   Procedure: COLONOSCOPY;  Surgeon: Jerene Bears, MD;  Location: [facility];  Service: Gastroenterology;  Laterality: N/A;  . ESOPHAGOGASTRODUODENOSCOPY N/A 08/27/2013   Procedure: ESOPHAGOGASTRODUODENOSCOPY (EGD);  Surgeon: Jerene Bears, MD;  Location: [facility];  Service: Gastroenterology;  Laterality: N/A;  . FOOT AMPUTATION THROUGH METATARSAL Left   . FOOT SURGERY     age 76  . TOTAL HIP ARTHROPLASTY Bilateral 2002, 2003  . TOTAL KNEE ARTHROPLASTY Bilateral 1999   Social History   Occupational History  . Occupation: retired Personal assistant  Tobacco Use  . Smoking status: Never Smoker  . Smokeless tobacco: Never Used  Substance and Sexual Activity  . Alcohol use: No  . Drug use: No  . Sexual activity: Not on file

## 2018-03-21 ENCOUNTER — Other Ambulatory Visit: Payer: Self-pay | Admitting: Interventional Cardiology

## 2018-03-21 NOTE — Telephone Encounter (Signed)
Prescribing Provider Encounter Provider  Jettie Booze, MD Jettie Booze, MD  Outpatient Medication Detail    Disp Refills Start End   amLODipine (NORVASC) 5 MG tablet 90 tablet 0 01/11/2018    Sig - Route: Take 1 tablet (5 mg total) by mouth daily. Please make yearly appt with Dr. Irish Lack for August before anymore refills. 1st attempt - Oral   Sent to pharmacy as: amLODipine (NORVASC) 5 MG tablet   Notes to Pharmacy: Please call our office to schedule an yearly appointment with Dr. Irish Lack for August before anymore refills. [phone]. Thank you 1st attempt   E-Prescribing Status: Receipt confirmed by pharmacy (01/11/2018 1:28 PM EDT)   Pharmacy   [facility] - [place], [place]

## 2018-03-23 ENCOUNTER — Other Ambulatory Visit: Payer: Self-pay | Admitting: Interventional Cardiology

## 2018-04-02 ENCOUNTER — Telehealth: Payer: Self-pay | Admitting: Internal Medicine

## 2018-04-02 NOTE — Telephone Encounter (Signed)
Pt wants to do cologuard and was calling to see when his kit would be sent out. Per [employer]olog[employer] ([employer]) the kit was delivered 01/01/18@12 :31 on his porch. Attempted to call pt back several times and receive message pt is unavailable at this time. Will try again later.

## 2018-04-02 NOTE — Telephone Encounter (Signed)
Returned call and left message for pt to call back. Cologuard on hold due to inactivity.

## 2018-04-03 NOTE — Telephone Encounter (Signed)
Attempted to call pt again, still unable to reach pt.

## 2018-04-05 NOTE — Telephone Encounter (Signed)
Still unable to reach pt by phone.

## 2018-04-08 NOTE — Telephone Encounter (Signed)
After multiple attempts have been unable to reach pt by phone. Letter mailed to pt.

## 2018-06-03 ENCOUNTER — Ambulatory Visit (INDEPENDENT_AMBULATORY_CARE_PROVIDER_SITE_OTHER): Payer: Medicare Other | Admitting: Internal Medicine

## 2018-06-03 ENCOUNTER — Ambulatory Visit
Admission: RE | Admit: 2018-06-03 | Discharge: 2018-06-03 | Disposition: A | Discharge status: Home / Self Care | Payer: Medicare Other | Source: Ambulatory Visit | Attending: Internal Medicine | Admitting: Internal Medicine

## 2018-06-03 ENCOUNTER — Encounter: Payer: Self-pay | Admitting: Internal Medicine

## 2018-06-03 VITALS — BP 159/75 | HR 78 | Temp 97.5°F | Wt 220.8 lb

## 2018-06-03 DIAGNOSIS — L97521 Non-pressure chronic ulcer of other part of left foot limited to breakdown of skin: Secondary | ICD-10-CM

## 2018-06-03 NOTE — Progress Notes (Signed)
[facility]      Reason for Consult: diabetic foot ulcer    Referring Physician: Dr. Jerilee Hoh    Patient ID: Christian [name], male    DOB: 1941-07-27, 77 y.o.   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  He was followed by Dr. Sharol Given but last saw him in June 2019.  It was recommended he wear knee-high compression stockings daily.  Does not appear he returned for follow up there.  Now seeing wound care with Dr. Jerilee Hoh and sent here with findings of an MRI report as above.  He was put on 6 weeks of oral doxycycline at the time, though no deep culture had been done to my knowledge.  There are multiple wound cultures, presumably swabs.  He has noted his plantar wound has been improving, no issues at the site of 3rd metatarsal area.  No pus. Serosanguinous drainage.  Previous record reviewed and summarized above along with input from patient.    Past Medical History:  Diagnosis Date  . Angina pectoris ([place])    Nuclear stress test 8/18: EF 59, fixed inf-lat defect likely attenuation, no ischemia, Low Risk  . Arthritis   . CMT (Charcot-Marie-Tooth disease)   . Deafness   . Diverticulosis   . DM (diabetes mellitus) ([place])   . HLD (hyperlipidemia)   . HTN (hypertension)   . Iron deficiency anemia   . Multiple gastric ulcers   . Overactive bladder   . Ulcer     Prior to Admission medications   Medication Sig Start Date End Date Taking? Authorizing Provider  amLODipine (NORVASC) 5 MG tablet Take 1 tablet (5 mg total) by mouth daily. Please schedule yearly appt for more refills, thanks! [id number] [phone] 03/21/18  Yes Jettie Booze, MD  amLODipine-benazepril (LOTREL) 5-20 MG capsule Take by mouth.   Yes [provider]  Cholecalciferol (VITAMIN D) 1000 UNITS capsule Take 1,000 Units by mouth 3 (three) times  a week. Monday, Wednesday, Saturday   Yes [provider]  cyclobenzaprine (FLEXERIL) 10 MG tablet Take 10 mg by mouth 3 (three) times daily as needed for muscle spasms.  03/04/14  Yes [provider]  diclofenac (VOLTAREN) 75 MG EC tablet Take 75 mg by mouth 2 (two) times daily as needed for pain. 04/29/17  Yes [provider]  dicyclomine (BENTYL) 20 MG tablet Take 1 tablet by mouth daily. 07/09/16  Yes [provider]  fexofenadine (ALLEGRA) 180 MG tablet Take 180 mg by mouth daily as needed for allergies.    Yes [provider]  GuaiFENesin (MUCINEX PO) Take 1 tablet by mouth daily as needed (to loosen phlegm).   Yes [provider]  insulin glargine (LANTUS) 100 units/mL SOLN Inject 10 Units into the skin at bedtime.    Yes [provider]  isosorbide mononitrate (IMDUR) 60 MG 24 hr tablet TAKE 1 TABLET BY MOUTH  DAILY 02/22/18  Yes Weaver, Scott T, PA-C  LORazepam (ATIVAN) 2 MG tablet Take 2 mg by mouth at bedtime. Take one half tablet by mouth in the morning and one half tablet by mouth in the evening    Yes [provider]  metFORMIN (GLUCOPHAGE) 1000 MG tablet Take 1,000 mg by mouth 2 (two) times daily with a meal.   Yes [provider]  nitroGLYCERIN (NITROSTAT) 0.4 MG SL tablet  Place 1 tablet (0.4 mg total) under the tongue every 5 (five) minutes as needed for chest pain. Please schedule appt for further refills 03/25/18  Yes Jettie Booze, MD  pantoprazole (PROTONIX) 40 MG tablet Take 1 tablet (40 mg total) by mouth daily. 12/27/17  Yes Pyrtle, Lajuan Lines, MD  pioglitazone (ACTOS) 30 MG tablet Take 30 mg by mouth daily.   Yes [provider]  polyethylene glycol (MIRALAX / GLYCOLAX) packet Take 17 g by mouth 2 (two) times daily as needed (constipation).    Yes [provider]  predniSONE (DELTASONE) 10 MG tablet Take 2 tablets (20 mg total) by mouth daily with breakfast. 08/09/17  Yes [name], MD  traMADol (ULTRAM) 50 MG tablet Take 1-2 tablet up to 3 times daly as needed for pain 03/30/14  Yes Pyrtle, Lajuan Lines, MD  TRULICITY 2.26 JF/3.5KT SOPN Inject 1 Dose into the skin once a week. 12/22/17  Yes [provider]  rosuvastatin (CRESTOR) 10 MG tablet Take 10 mg by mouth at bedtime.     [provider]    Allergies  Allergen Reactions  . Lipitor [Atorvastatin] Other (See Comments)    Social History   Tobacco Use  . Smoking status: Never Smoker  . Smokeless tobacco: Never Used  Substance Use Topics  . Alcohol use: No  . Drug use: No    Family History  Problem Relation Age of Onset  . Cervical cancer Mother        mets  . Breast cancer Sister        mets  . Diabetes Sister   . Diabetes Brother   . Heart disease Sister   . Cervical cancer Sister     Review of Systems  Constitutional: negative for fevers, chills and malaise Gastrointestinal: negative for nausea and diarrhea Integument/breast: negative for rash All other systems reviewed and are negative    Constitutional: in no apparent distress and alert There were no vitals filed for this visit. EYES: anicteric ENMT: no thrush Cardiovascular: Cor RRR Respiratory: CTA B; normal respiratory effort GI: Bowel sounds are normal, liver is not enlarged, spleen is not enlarged Musculoskeletal: no pedal edema noted; large plantar ulcer, stage 1-2, no surrounding erythema, +serosanuinous drainage; closed metatarsal amputation site otherwise.   Skin: negatives: no rash Hematologic: no cervical lad  Labs: Lab Results  Component Value Date   WBC 8.8 04/24/2017   HGB 13.1 04/24/2017   HCT 40.0 04/24/2017   MCV 89 04/24/2017   PLT 301 04/24/2017    Lab Results  Component Value Date   CREATININE 1.29 (H) 05/01/2017   BUN 35 (H) 05/01/2017   NA 136 05/01/2017   K 4.9 05/01/2017   CL 103 05/01/2017   CO2 17 (L) 05/01/2017    Lab Results  Component Value Date   ALT 25 12/03/2009   AST  24 12/03/2009   ALKPHOS 192 (H) 12/03/2009   BILITOT 0.6 12/03/2009   INR 1.04 08/13/2013     Assessment: Diabetic foot ulcer.  Ulcer examined, no concerns.  MRI with possible osteomyelitis in the past but no obvious concerns now and no indication for continued antibiotics.  I will check an xray to be sure no significant changes compared to previous MRI.    Plan: 1) foot xray rtc prn unless concerns.    ADDENDUM: xray reviewed and no erosive changes noted, no concerns.  No treatment for osteomyelitis indicated.

## 2018-08-19 ENCOUNTER — Ambulatory Visit (INDEPENDENT_AMBULATORY_CARE_PROVIDER_SITE_OTHER): Payer: Medicare Other | Admitting: Orthopedic Surgery

## 2018-08-26 ENCOUNTER — Encounter (INDEPENDENT_AMBULATORY_CARE_PROVIDER_SITE_OTHER): Payer: Self-pay | Admitting: Orthopedic Surgery

## 2018-08-26 ENCOUNTER — Ambulatory Visit (INDEPENDENT_AMBULATORY_CARE_PROVIDER_SITE_OTHER): Payer: Medicare Other | Admitting: Orthopedic Surgery

## 2018-08-26 VITALS — Ht 68.0 in | Wt 220.8 lb

## 2018-08-26 DIAGNOSIS — M25531 Pain in right wrist: Secondary | ICD-10-CM | POA: Diagnosis not present

## 2018-08-26 DIAGNOSIS — M7541 Impingement syndrome of right shoulder: Secondary | ICD-10-CM | POA: Diagnosis not present

## 2018-08-26 DIAGNOSIS — M7542 Impingement syndrome of left shoulder: Secondary | ICD-10-CM | POA: Diagnosis not present

## 2018-08-26 MED ORDER — METHYLPREDNISOLONE ACETATE 40 MG/ML IJ SUSP
40.0000 mg | INTRAMUSCULAR | Status: AC | PRN
  Administered 2018-08-26: 40 mg via INTRA_ARTICULAR

## 2018-08-26 MED ORDER — LIDOCAINE HCL 1 % IJ SOLN
1.0000 mL | INTRAMUSCULAR | Status: AC | PRN
  Administered 2018-08-26: 1 mL

## 2018-08-26 NOTE — Progress Notes (Signed)
Office Visit Note   Patient: Christian [name]           Date of Birth: 08-16-1941           MRN: [id number] Visit Date: 08/26/2018              Requested by: Wenda Low, MD [address] [place], [place] [address] PCP: Wenda Low, MD  Chief Complaint  Patient presents with  . Left Shoulder - Pain    Cortisone Inj preferred  . Right Shoulder - Pain  . Right Wrist - Pain      HPI: Patient is a 77 year old gentleman with chronic arthritis of multiple joints.  Patient is seen today with his sign language interpreter with complaints of bilateral shoulder pain and right wrist pain.  Patient states the pain is severe denies any injuries denies any falls.  He states he has pain with trying to reach overhead or reach out in front of him.  Patient complains of pain over the scapholunate interval of the right wrist.  Assessment & Plan: Visit Diagnoses:  1. Impingement syndrome of both shoulders   2. Pain in right wrist     Plan: Both shoulders were injected the subacromial space the wrist was injected through the scapholunate interval.  Follow-Up Instructions: Return if symptoms worsen or fail to improve.   Ortho Exam  Patient is alert, oriented, no adenopathy, well-dressed, normal affect, normal respiratory effort. Examination patient has an antalgic gait he uses a cane he is very unsteady on his feet.  Examination of both shoulders he has abduction flexion of 70 degrees.  He has pain with Neer and Hawkins impingement test pain with drop arm test bilaterally.  Examination of the wrist there is no redness no cellulitis he is tender to palpation over the scapholunate interval.  The scaphoid is nontender to palpation the TFCC is minimally tender to palpation.  Imaging: No results found. No images are attached to the encounter.  Labs: Lab Results  Component Value Date   HGBA1C (H) 12/04/2009    10.5 (NOTE) The ADA recommends the following therapeutic goal for  glycemic control related to Hgb A1c measurement: Goal of therapy: <6.5 Hgb A1c  Reference: American Diabetes Association: Clinical Practice Recommendations 2010, Diabetes Care, 2010, 33: (Suppl  1).   REPTSTATUS 12/07/2010 FINAL 10/21/2010   REPTSTATUS 10/24/2010 FINAL 10/21/2010   REPTSTATUS 10/26/2010 FINAL 10/21/2010   GRAMSTAIN  10/21/2010    NO WBC SEEN NO SQUAMOUS EPITHELIAL CELLS SEEN NO ORGANISMS SEEN   GRAMSTAIN  10/21/2010    NO WBC SEEN NO SQUAMOUS EPITHELIAL CELLS SEEN NO ORGANISMS SEEN   CULT NO ACID FAST BACILLI ISOLATED IN 6 WEEKS 10/21/2010   CULT  10/21/2010    FEW STAPHYLOCOCCUS AUREUS Note: RIFAMPIN AND GENTAMICIN SHOULD NOT BE USED AS SINGLE DRUGS FOR TREATMENT OF STAPH INFECTIONS. DOXYCLINE SENSITIVE   CULT NO ANAEROBES ISOLATED 10/21/2010   LABORGA STAPHYLOCOCCUS AUREUS 10/21/2010     Lab Results  Component Value Date   ALBUMIN 3.3 (L) 12/03/2009    Body mass index is 33.57 kg/m.  Orders:  Orders Placed This Encounter  Procedures  . Large Joint Inj  . Medium Joint Inj   No orders of the defined types were placed in this encounter.    Procedures: Large Joint Inj: bilateral subacromial bursa on 08/26/2018 4:20 PM Indications: diagnostic evaluation and pain Details: 22 G 1.5 in needle, posterior approach  Arthrogram: No  Outcome: tolerated well, no immediate  complications Procedure, treatment alternatives, risks and benefits explained, specific risks discussed. Consent was given by the patient. Immediately prior to procedure a time out was called to verify the correct patient, procedure, equipment, support staff and site/side marked as required. Patient was prepped and draped in the usual sterile fashion.   Medium Joint Inj: R radiocarpal on 08/26/2018 4:21 PM Indications: pain and diagnostic evaluation Details: 22 G 1.5 in needle, dorsal approach Medications: 1 mL lidocaine 1 %; 40 mg methylPREDNISolone acetate 40 MG/ML Outcome: tolerated well,  no immediate complications Procedure, treatment alternatives, risks and benefits explained, specific risks discussed. Consent was given by the patient. Immediately prior to procedure a time out was called to verify the correct patient, procedure, equipment, support staff and site/side marked as required. Patient was prepped and draped in the usual sterile fashion.      Clinical Data: No additional findings.  ROS:  All other systems negative, except as noted in the HPI. Review of Systems  Objective: Vital Signs: Ht 5\' 8"  (1.727 m)   Wt 220 lb 12.8 oz (100.2 kg)   BMI 33.57 kg/m   Specialty Comments:  No specialty comments available.  PMFS History: Patient Active Problem List   Diagnosis Date Noted  . Ulcer of left foot, limited to breakdown of skin ([place]) 02/07/2018  . Chronic pain of both shoulders 08/09/2017  . Pain in right wrist 08/09/2017  . 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 Chronic left-sided low back pain with left-sided sciatica 07/24/2016  . Lower extremity edema 01/28/2015  . Angina pectoris ([place]) 07/08/2014  . Anemia, unspecified 05/06/2014  . Acute gastric ulcer 08/27/2013  . 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 IDA (iron deficiency anemia) 08/25/2013  . Mixed hyperlipidemia 08/13/2013  . Essential hypertension, benign 08/13/2013   Past Medical History:  Diagnosis Date  . Angina pectoris ([facility])    Nuclear stress test 8/18: EF 59, fixed inf-lat defect likely attenuation, no ischemia, Low Risk  . Arthritis   . CMT (Charcot-Marie-Tooth disease)   . Deafness   . Diverticulosis   . DM (diabetes mellitus) ([place])   . HLD (hyperlipidemia)   . HTN (hypertension)   . Iron deficiency anemia   . Multiple gastric ulcers   . Overactive bladder   . Ulcer     Family History  Problem Relation Age of Onset  . Cervical cancer Mother        mets  . Breast cancer Sister        mets    . Diabetes Sister   . Diabetes Brother   . Heart disease Sister   . Cervical cancer Sister     Past Surgical History:  Procedure Laterality Date  . COLONOSCOPY N/A 08/27/2013   Procedure: COLONOSCOPY;  Surgeon: Jerene Bears, MD;  Location: [facility];  Service: Gastroenterology;  Laterality: N/A;  . ESOPHAGOGASTRODUODENOSCOPY N/A 08/27/2013   Procedure: ESOPHAGOGASTRODUODENOSCOPY (EGD);  Surgeon: Jerene Bears, MD;  Location: [facility];  Service: Gastroenterology;  Laterality: N/A;  . FOOT AMPUTATION THROUGH METATARSAL Left   . FOOT SURGERY     age 47  . TOTAL HIP ARTHROPLASTY Bilateral 2002, 2003  . TOTAL KNEE ARTHROPLASTY Bilateral 1999   Social History   Occupational History  . Occupation: retired Personal assistant  Tobacco Use  . Smoking status: Never Smoker  . Smokeless tobacco: Never Used  Substance and Sexual Activity  . Alcohol use: No  . Drug use:  No  . Sexual activity: Not on file

## 2018-09-07 ENCOUNTER — Other Ambulatory Visit: Payer: Self-pay | Admitting: Interventional Cardiology

## 2018-10-25 ENCOUNTER — Other Ambulatory Visit: Payer: Self-pay | Admitting: Interventional Cardiology

## 2018-11-08 ENCOUNTER — Other Ambulatory Visit: Payer: Self-pay | Admitting: Interventional Cardiology

## 2018-11-08 MED ORDER — AMLODIPINE BESYLATE 5 MG PO TABS: 5.0000 mg | ORAL_TABLET | Freq: Every day | ORAL | 0 refills | Status: DC

## 2018-11-08 NOTE — Telephone Encounter (Signed)
°*  STAT* If patient is at the pharmacy, call can be transferred to refill team.   1. Which medications need to be refilled? (please list name of each medication and dose if known) Amlodipine  2. Which pharmacy/location (including street and city if local pharmacy) is medication to be sent to? [facility]  3. Do they need a 30 day or 90 day supply? 90 and refills

## 2018-11-18 ENCOUNTER — Other Ambulatory Visit: Payer: Self-pay

## 2018-11-18 MED ORDER — AMLODIPINE BESYLATE 5 MG PO TABS: 5.0000 mg | ORAL_TABLET | Freq: Every day | ORAL | 0 refills | Status: DC

## 2018-12-03 ENCOUNTER — Telehealth: Payer: Self-pay | Admitting: Interventional Cardiology

## 2018-12-03 ENCOUNTER — Other Ambulatory Visit: Payer: Self-pay

## 2018-12-03 MED ORDER — AMLODIPINE BESYLATE 5 MG PO TABS: 5.0000 mg | ORAL_TABLET | Freq: Every day | ORAL | 0 refills | Status: DC

## 2018-12-03 NOTE — Telephone Encounter (Signed)
New message    *STAT* If patient is at the pharmacy, call can be transferred to refill team.   1. Which medications need to be refilled? (please list name of each medication and dose if known) amLODipine (NORVASC) 5 MG tablet  2. Which pharmacy/location (including street and city if local pharmacy) is medication to be sent to?[place], [place]  3. Do they need a 30 day or 90 day supply? [place]

## 2019-01-07 ENCOUNTER — Telehealth: Payer: Self-pay

## 2019-01-07 NOTE — Telephone Encounter (Signed)

## 2019-01-14 ENCOUNTER — Telehealth (INDEPENDENT_AMBULATORY_CARE_PROVIDER_SITE_OTHER): Payer: Medicare Other | Admitting: Interventional Cardiology

## 2019-01-14 ENCOUNTER — Other Ambulatory Visit: Payer: Self-pay

## 2019-01-14 ENCOUNTER — Encounter: Payer: Self-pay | Admitting: Interventional Cardiology

## 2019-01-14 VITALS — Ht 68.0 in | Wt 204.0 lb

## 2019-01-14 DIAGNOSIS — I10 Essential (primary) hypertension: Secondary | ICD-10-CM

## 2019-01-14 DIAGNOSIS — E119 Type 2 diabetes mellitus without complications: Secondary | ICD-10-CM | POA: Diagnosis not present

## 2019-01-14 DIAGNOSIS — R0602 Shortness of breath: Secondary | ICD-10-CM

## 2019-01-14 DIAGNOSIS — I209 Angina pectoris, unspecified: Secondary | ICD-10-CM | POA: Diagnosis not present

## 2019-01-14 DIAGNOSIS — Z794 Long term (current) use of insulin: Secondary | ICD-10-CM

## 2019-01-14 DIAGNOSIS — E782 Mixed hyperlipidemia: Secondary | ICD-10-CM | POA: Diagnosis not present

## 2019-01-14 NOTE — Patient Instructions (Signed)

## 2019-01-14 NOTE — Progress Notes (Signed)
Virtual Visit via Telephone Note   This visit type was conducted due to national recommendations for restrictions regarding the COVID-19 Pandemic (e.g. social distancing) in an effort to limit this patient's exposure and mitigate transmission in our community.  Due to his co-morbid illnesses, this patient is at least at moderate risk for complications without adequate follow up.  This format is felt to be most appropriate for this patient at this time.  The patient did not have access to video technology/had technical difficulties with video requiring transitioning to audio format only (telephone).  All issues noted in this document were discussed and addressed.  No physical exam could be performed with this format.  Please refer to the patient's chart for his  consent to telehealth for [facility].   History obtained with video sign language interpreter Date:  01/14/2019   ID:  ETHEL VERONICA, DOB Feb 01, 1941, MRN [id number]  Patient Location: Home Provider Location: Home  PCP:  Wenda Low, MD  Cardiologist:  No primary care provider on file. [place] Electrophysiologist:  None   Evaluation Performed:  Follow-Up Visit  Chief Complaint:  Angina pectoris  History of Present Illness:    TALIESIN HARTLAGE is a 78 y.o. male with with a hx of Diabetes, HTN, HL, Charcot-Marie-Tooth disease, peptic ulcer disease. He was evaluated for angina in 2014. He was set up for cardiac catheterization but was found to be anemic. Instead, he underwent GI evaluation with EGD which demonstrated gastric ulcers. His symptoms improved with improved hemoglobin.    A stress test was done in 8/18 which was negative for ischemia.   The left ventricular ejection fraction is normal (55-65%).  Nuclear stress EF: 59%.  There was no ST segment deviation noted during stress.  This is a low risk study.   He walks 20 minutes at a time, then gets back pain for which he stops.  He does check BP at home.   Systolics typically in the 137-150 range/80-85.  Denies : Chest pain. Dizziness. Leg edema. Nitroglycerin use. Orthopnea. Palpitations. Paroxysmal nocturnal dyspnea. Shortness of breath. Syncope.   The patient does not have symptoms concerning for COVID-19 infection (fever, chills, cough, or new shortness of breath).    Past Medical History:  Diagnosis Date   Angina pectoris [facility])    Nuclear stress test 8/18: EF 59, fixed inf-lat defect likely attenuation, no ischemia, Low Risk   Arthritis    CMT (Charcot-Marie-Tooth disease)    Deafness    Diverticulosis    DM (diabetes mellitus) ([place])    HLD (hyperlipidemia)    HTN (hypertension)    Iron deficiency anemia    Multiple gastric ulcers    Overactive bladder    Ulcer    Past Surgical History:  Procedure Laterality Date   COLONOSCOPY N/A 08/27/2013   Procedure: COLONOSCOPY;  Surgeon: Jerene Bears, MD;  Location: [facility];  Service: Gastroenterology;  Laterality: N/A;   ESOPHAGOGASTRODUODENOSCOPY N/A 08/27/2013   Procedure: ESOPHAGOGASTRODUODENOSCOPY (EGD);  Surgeon: Jerene Bears, MD;  Location: [facility];  Service: Gastroenterology;  Laterality: N/A;   FOOT AMPUTATION THROUGH METATARSAL Left    FOOT SURGERY     age 40   TOTAL HIP ARTHROPLASTY Bilateral 2002, 2003   TOTAL KNEE ARTHROPLASTY Bilateral 1999     Current Meds  Medication Sig   amLODipine (NORVASC) 5 MG tablet Take 1 tablet (5 mg total) by mouth daily.   amLODipine-benazepril (LOTREL) 5-20 MG capsule Take by mouth.   Cholecalciferol (VITAMIN D)  1000 UNITS capsule Take 1,000 Units by mouth 3 (three) times a week. Monday, Wednesday, Saturday   cyclobenzaprine (FLEXERIL) 10 MG tablet Take 10 mg by mouth 3 (three) times daily as needed for muscle spasms.    diclofenac (VOLTAREN) 75 MG EC tablet Take 75 mg by mouth 2 (two) times daily as needed for pain.   dicyclomine (BENTYL) 20 MG tablet Take 1 tablet by mouth daily.   fexofenadine  (ALLEGRA) 180 MG tablet Take 180 mg by mouth daily as needed for allergies.    GuaiFENesin (MUCINEX PO) Take 1 tablet by mouth daily as needed (to loosen phlegm).   insulin glargine (LANTUS) 100 units/mL SOLN Inject 10 Units into the skin at bedtime.    isosorbide mononitrate (IMDUR) 60 MG 24 hr tablet TAKE 1 TABLET BY MOUTH  DAILY   LORazepam (ATIVAN) 2 MG tablet Take 2 mg by mouth at bedtime. Take one half tablet by mouth in the morning and one half tablet by mouth in the evening    metFORMIN (GLUCOPHAGE) 1000 MG tablet Take 1,000 mg by mouth 2 (two) times daily with a meal.   nitroGLYCERIN (NITROSTAT) 0.4 MG SL tablet Place 1 tablet (0.4 mg total) under the tongue every 5 (five) minutes as needed for chest pain. Please schedule appt for further refills   pantoprazole (PROTONIX) 40 MG tablet Take 1 tablet (40 mg total) by mouth daily.   pioglitazone (ACTOS) 30 MG tablet Take 30 mg by mouth daily.   polyethylene glycol (MIRALAX / GLYCOLAX) packet Take 17 g by mouth 2 (two) times daily as needed (constipation).    predniSONE (DELTASONE) 10 MG tablet Take 2 tablets (20 mg total) by mouth daily with breakfast.   rosuvastatin (CRESTOR) 10 MG tablet Take 10 mg by mouth at bedtime.    traMADol (ULTRAM) 50 MG tablet Take 1-2 tablet up to 3 times daly as needed for pain   Current Facility-Administered Medications for the 01/14/19 encounter (Telemedicine) with Jettie Booze, MD  Medication   cadexomer iodine (IODOSORB) 0.9 % gel     Allergies:   Lipitor [atorvastatin]   Social History   Tobacco Use   Smoking status: Never Smoker   Smokeless tobacco: Never Used  Substance Use Topics   Alcohol use: No   Drug use: No     Family Hx: The patient's family history includes Breast cancer in his sister; Cervical cancer in his mother and sister; Diabetes in his brother and sister; Heart disease in his sister.  ROS:   Please see the history of present illness.    Blood pressure  can fluctuate All other systems reviewed and are negative.   Prior CV studies:   The following studies were reviewed today:  As above  Labs/Other Tests and Data Reviewed:    EKG:  An ECG dated 8/18 was personally reviewed today and demonstrated:  NSR  Recent Labs: No results found for requested labs within last 8760 hours.   Recent Lipid Panel No results found for: CHOL, TRIG, HDL, CHOLHDL, LDLCALC, LDLDIRECT  Wt Readings from Last 3 Encounters:  01/14/19 204 lb (92.5 kg)  08/26/18 220 lb 12.8 oz (100.2 kg)  06/03/18 220 lb 12.8 oz (100.2 kg)     Objective:    Vital Signs:  Ht 5\' 8"  (1.727 m)    Wt 204 lb (92.5 kg)    BMI 31.02 kg/m    VITAL SIGNS:  reviewed exam limited by phone format  ASSESSMENT & PLAN:  1. Angina pectoris: Sx controlled with medications.  Known PAD. 2. SHOB: stable.  Not having this problem with walking.  3. HTN: Fairly well controlled.  Decrease salt intake.  He has been eating out several times a week.  Avoid processed foods.  Renal artery stenosis noted int he past.  Cr 1.5 4. Hyperlipidemia: LDL 70 in 10/2018. 5. Type 2 DM: A1C 6.6 in 10/2018.  COVID-19 Education: The signs and symptoms of COVID-19 were discussed with the patient and how to seek care for testing (follow up with PCP or arrange E-visit).  The importance of social distancing was discussed today.  Time:   Today, I have spent 25 minutes with the patient with telehealth technology discussing the above problems.     Medication Adjustments/Labs and Tests Ordered: Current medicines are reviewed at length with the patient today.  Concerns regarding medicines are outlined above.   Tests Ordered: No orders of the defined types were placed in this encounter.   Medication Changes: No orders of the defined types were placed in this encounter.   Disposition:  Follow up in 1 year(s)  Signed, Larae Grooms, MD  01/14/2019 10:36 AM    [place]

## 2019-02-07 ENCOUNTER — Other Ambulatory Visit: Payer: Self-pay | Admitting: Physician Assistant

## 2019-03-11 ENCOUNTER — Other Ambulatory Visit: Payer: Self-pay | Admitting: Interventional Cardiology

## 2019-03-24 ENCOUNTER — Other Ambulatory Visit: Payer: Self-pay | Admitting: Interventional Cardiology

## 2019-04-11 ENCOUNTER — Telehealth: Payer: Self-pay | Admitting: Interventional Cardiology

## 2019-04-11 MED ORDER — AMLODIPINE BESYLATE 10 MG PO TABS: 10.0000 mg | ORAL_TABLET | Freq: Every day | ORAL | 3 refills | Status: DC

## 2019-04-11 NOTE — Telephone Encounter (Signed)
[facility] mail order pharmacy is requesting clarification on pt's medication amlodipine. Pt has two Rx on med list, Amlodipine 5 mg tablet and Amlodipine/Benaspril 5-20 mg capsule. Does pt suppose to be taking both of these medications? [facility] ph# [phone], order [id number]. Please address

## 2019-04-11 NOTE — Telephone Encounter (Signed)
Per pt is taking Amlodpine not the combo med and was told to increase Amlodipine to 10 mg latest B/P was 140/79 Reviewed pt's records and no documentation noted for increase Will forward to Dr Irish Lack for review and recommendations ./cy

## 2019-04-11 NOTE — Telephone Encounter (Signed)
Lm to call back ./cy 

## 2019-04-11 NOTE — Telephone Encounter (Signed)
Per pt has been taking 10 mg of the Amlodipine Refill sent to [facility] as requested ./cy

## 2019-04-11 NOTE — Telephone Encounter (Signed)
If he has been taking amlodipine 10 mg daily, ok to refill.

## 2019-04-29 ENCOUNTER — Encounter: Payer: Self-pay | Admitting: Orthopedic Surgery

## 2019-04-29 ENCOUNTER — Ambulatory Visit (INDEPENDENT_AMBULATORY_CARE_PROVIDER_SITE_OTHER): Payer: Medicare Other | Admitting: Orthopedic Surgery

## 2019-04-29 VITALS — Ht 68.0 in | Wt 204.0 lb

## 2019-04-29 DIAGNOSIS — L97521 Non-pressure chronic ulcer of other part of left foot limited to breakdown of skin: Secondary | ICD-10-CM

## 2019-04-29 NOTE — Progress Notes (Signed)
Office Visit Note   Patient: Christian [name]           Date of Birth: 12-Feb-1941           MRN: [id number] Visit Date: 04/29/2019              Requested by: Wenda Low, MD [address] [place],  [place] [address] PCP: Wenda Low, MD  Chief Complaint  Patient presents with  . 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  He has had custom orthotics and spacers fabricated by [employer] but patient has cut away the orthotic and is having increasing pressure in the cut out area.  Assessment & Plan: Visit Diagnoses: No diagnosis found.  Plan: Patient was given a new prescription for [employer] for custom orthotics and a spacer for the transmetatarsal amputation on the left.  Patient will require this for safe ambulation and without the orthotic patient is at risk of loss of foot.  Follow-Up Instructions: Return if symptoms worsen or fail to improve.   Ortho Exam  Patient is alert, oriented, no adenopathy, well-dressed, normal affect, normal respiratory effort. Examination patient's foot is plantigrade no equinus contracture he has a Wegner grade 1 ulcer on the plantar aspect of the left transmetatarsal amputation this is 10 mm in diameter 1 mm deep there is good granulation tissue at the base no infection no drainage no exposed bone or tendon.  His orthotics are completely disheveled and not functional.  Imaging: No results found. No images are attached to the encounter.  Labs: Lab Results  Component Value Date   HGBA1C (H) 12/04/2009    10.5 (NOTE) The ADA recommends the following therapeutic goal for glycemic control related to Hgb A1c measurement: Goal of therapy: <6.5 Hgb A1c  Reference: American Diabetes Association: Clinical Practice Recommendations 2010, Diabetes Care, 2010, 33: (Suppl  1).   REPTSTATUS 12/07/2010 FINAL  10/21/2010   REPTSTATUS 10/24/2010 FINAL 10/21/2010   REPTSTATUS 10/26/2010 FINAL 10/21/2010   GRAMSTAIN  10/21/2010    NO WBC SEEN NO SQUAMOUS EPITHELIAL CELLS SEEN NO ORGANISMS SEEN   GRAMSTAIN  10/21/2010    NO WBC SEEN NO SQUAMOUS EPITHELIAL CELLS SEEN NO ORGANISMS SEEN   CULT NO ACID FAST BACILLI ISOLATED IN 6 WEEKS 10/21/2010   CULT  10/21/2010    FEW STAPHYLOCOCCUS AUREUS Note: RIFAMPIN AND GENTAMICIN SHOULD NOT BE USED AS SINGLE DRUGS FOR TREATMENT OF STAPH INFECTIONS. DOXYCLINE SENSITIVE   CULT NO ANAEROBES ISOLATED 10/21/2010   LABORGA STAPHYLOCOCCUS AUREUS 10/21/2010     Lab Results  Component Value Date   ALBUMIN 3.3 (L) 12/03/2009    No results found for: MG No results found for: VD25OH  No results found for: PREALBUMIN CBC EXTENDED Latest Ref Rng & Units 04/24/2017 05/06/2014 12/24/2013  WBC 3.4 - 10.8 x10E3/uL 8.8 7.6 8.1  RBC 4.14 - 5.80 x10E6/uL 4.49 4.44 4.49  HGB 13.0 - 17.7 g/dL 13.1 12.6(L) 11.6(L)  HCT 37.5 - 51.0 % 40.0 38.1(L) 35.5(L)  PLT 150 - 379 x10E3/uL 301 252.0 250.0  NEUTROABS 1.7 - 7.7 K/uL - - -  LYMPHSABS 0.7 - 4.0 K/uL - - -     Body mass index is 31.02 kg/m.  Orders:  No orders of the defined types were placed in this encounter.  No orders of the defined types were placed in this  encounter.    Procedures: No procedures performed  Clinical Data: No additional findings.  ROS:  All other systems negative, except as noted in the HPI. Review of Systems  Objective: Vital Signs: Ht 5\' 8"  (1.727 m)   Wt 204 lb (92.5 kg)   BMI 31.02 kg/m   Specialty Comments:  No specialty comments available.  PMFS History: Patient Active Problem List   Diagnosis Date Noted  . Ulcer of left foot, limited to breakdown of skin ([place]) 02/07/2018  . Chronic pain of both shoulders 08/09/2017  . Pain in right wrist 08/09/2017  . Atherosclerosis of renal artery ([place]) 04/24/2017  . 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 Chronic left-sided low back pain with left-sided sciatica 07/24/2016  . Lower extremity edema 01/28/2015  . Angina pectoris ([place]) 07/08/2014  . Anemia, unspecified 05/06/2014  . Acute gastric ulcer 08/27/2013  . 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 IDA (iron deficiency anemia) 08/25/2013  . Mixed hyperlipidemia 08/13/2013  . Essential hypertension, benign 08/13/2013   Past Medical History:  Diagnosis Date  . Angina pectoris ([employer])    Nuclear stress test 8/18: EF 59, fixed inf-lat defect likely attenuation, no ischemia, Low Risk  . Arthritis   . CMT (Charcot-Marie-Tooth disease)   . Deafness   . Diverticulosis   . DM (diabetes mellitus) ([place])   . HLD (hyperlipidemia)   . HTN (hypertension)   . Iron deficiency anemia   . Multiple gastric ulcers   . Overactive bladder   . Ulcer     Family History  Problem Relation Age of Onset  . Cervical cancer Mother        mets  . Breast cancer Sister        mets  . Diabetes Sister   . Diabetes Brother   . Heart disease Sister   . Cervical cancer Sister     Past Surgical History:  Procedure Laterality Date  . COLONOSCOPY N/A 08/27/2013   Procedure: COLONOSCOPY;  Surgeon: Jerene Bears, MD;  Location: [facility];  Service: Gastroenterology;  Laterality: N/A;  . ESOPHAGOGASTRODUODENOSCOPY N/A 08/27/2013   Procedure: ESOPHAGOGASTRODUODENOSCOPY (EGD);  Surgeon: Jerene Bears, MD;  Location: [facility];  Service: Gastroenterology;  Laterality: N/A;  . FOOT AMPUTATION THROUGH METATARSAL Left   . FOOT SURGERY     age 35  . TOTAL HIP ARTHROPLASTY Bilateral 2002, 2003  . TOTAL KNEE ARTHROPLASTY Bilateral 1999   Social History   Occupational History  . Occupation: retired Personal assistant  Tobacco Use  . Smoking status: Never Smoker  . Smokeless tobacco: Never Used  Substance and Sexual Activity  . Alcohol use: No  . Drug use: No  . Sexual activity: Not on file

## 2019-06-09 ENCOUNTER — Telehealth: Payer: Self-pay | Admitting: Interventional Cardiology

## 2019-06-09 DIAGNOSIS — R0602 Shortness of breath: Secondary | ICD-10-CM

## 2019-06-09 NOTE — Telephone Encounter (Signed)
I think we need to do an echo on him for shortness of breath.

## 2019-06-09 NOTE — Telephone Encounter (Signed)
  Pt c/o Shortness Of Breath: STAT if SOB developed within the last 24 hours or pt is noticeably SOB on the phone  1. Are you currently SOB (can you hear that pt is SOB on the phone)? interpreter  2. How long have you been experiencing SOB? Periodic over last 3 months but today has been a lot worse  3. Are you SOB when sitting or when up moving around? Moving around  4. Are you currently experiencing any other symptoms? Feeling shaky, off balance, dizziness, also has some mucus coming up

## 2019-06-09 NOTE — Telephone Encounter (Signed)
Received call transferred directly from operator and spoke with sign language interpreter. She reports pt has been having shortness of breath for last 3 months. Has a cough at times with this and spits up whitish, green or yellow mucous.  No fever.   Has been having balance issues for about a year.  Was seen by primary care about a month ago. Recent weight loss from 225 lb to 192 lbs.  Today pt is having increased shortness of breath and difficulty walking due to balance issues and shortness of breath.  It is difficult to evaluate shortness of breath as information is being provided by interpreter.  Pt feels he needs to be seen today.  I advised pt to go to ED for evaluation of acute symptoms.  I advised him to call EMS if he is having ongoing issues with balance.  Pt does not want to do this and will go to Urgent Care.

## 2019-06-10 NOTE — Telephone Encounter (Signed)
Called and spoke to the patient via sign language interpreter. He states that he has been SOB for the past 3 months with an intermittent productive cough. He states that he has lost weight. Denies swelling. Denies excessive salt intake. Patient states that he did not go to the ER or urgent care because he did not feel that it was that bad. Made patient aware that Dr. Irish Lack would like for him to have an echo done. Made patient aware that I will discuss with Dr. Irish Lack the timing of when his echo will be and if he will need a COVID test done prior to then. Instructed patient to call back if his Sx change or worsen and I will be in contact with him soon.

## 2019-06-10 NOTE — Telephone Encounter (Signed)
If sx are not acute, he could wait for 2-3 weeks and then get the echo.

## 2019-06-10 NOTE — Telephone Encounter (Signed)
°  Patient called via sign language interpretor. Patient has NOT had covid19 test. He has only had temperature checks from the home health nurse.  Please return call to [phone] to discus plan for testing and echo

## 2019-06-10 NOTE — Telephone Encounter (Signed)
Left a message via sign language interpreter for patient to call back.  Since patient's wife recently passed away d/t COVID will check with patient to see if he has been tested. I do not see any testing that has been performed in our system.

## 2019-06-13 NOTE — Telephone Encounter (Signed)
Per Dr. Irish Lack patient can have an echo done in 2-3 weeks without being tested. Attempted to contact patient but there was no answer. Left message via sign language interpreter for patient to call back and schedule echo.

## 2019-06-17 ENCOUNTER — Encounter (HOSPITAL_COMMUNITY): Payer: Self-pay | Admitting: Interventional Cardiology

## 2019-06-20 ENCOUNTER — Telehealth: Payer: Self-pay | Admitting: Interventional Cardiology

## 2019-06-20 NOTE — Telephone Encounter (Signed)
New message:     Patient calling stating that some called him. I did not see a note. Please call patient. Patient states that a message can be left.

## 2019-07-09 DIAGNOSIS — G600 Hereditary motor and sensory neuropathy: Secondary | ICD-10-CM | POA: Insufficient documentation

## 2019-07-09 DIAGNOSIS — G9389 Other specified disorders of brain: Secondary | ICD-10-CM | POA: Insufficient documentation

## 2019-07-10 ENCOUNTER — Other Ambulatory Visit: Payer: Self-pay | Admitting: Interventional Cardiology

## 2019-07-14 NOTE — Telephone Encounter (Signed)
Echo has been scheduled for 11/13

## 2019-07-21 ENCOUNTER — Other Ambulatory Visit (HOSPITAL_COMMUNITY): Payer: Medicare Other

## 2019-07-23 DIAGNOSIS — D329 Benign neoplasm of meninges, unspecified: Secondary | ICD-10-CM | POA: Insufficient documentation

## 2019-07-23 DIAGNOSIS — K219 Gastro-esophageal reflux disease without esophagitis: Secondary | ICD-10-CM | POA: Insufficient documentation

## 2019-07-25 ENCOUNTER — Other Ambulatory Visit (HOSPITAL_COMMUNITY): Payer: Medicare Other

## 2019-07-28 ENCOUNTER — Other Ambulatory Visit (HOSPITAL_COMMUNITY): Payer: Medicare Other

## 2019-08-06 ENCOUNTER — Other Ambulatory Visit (HOSPITAL_COMMUNITY): Payer: Medicare Other

## 2019-08-11 ENCOUNTER — Telehealth (HOSPITAL_COMMUNITY): Payer: Self-pay

## 2019-08-11 NOTE — Telephone Encounter (Signed)
New message    Just an FYI. We have made several attempts to contact this patient including sending a letter to schedule or reschedule their echocardiogram. We will be removing the patient from the echo [place].   11.30.20 @ 9;52am lm on home vm - Ikaika Showers  11.25.20 no show  11.16.20 @ 10:26am lm on home vm - Tamala Manzer  10.9.20 @ 9:30am lm on home vm - Antwone Capozzoli  10.6.20 mail reminder letter Jari Sportsman

## 2019-08-27 ENCOUNTER — Ambulatory Visit: Payer: Medicare Other | Admitting: Interventional Cardiology

## 2019-08-29 ENCOUNTER — Ambulatory Visit (HOSPITAL_COMMUNITY): Payer: Medicare Other | Attending: Cardiology

## 2019-08-29 ENCOUNTER — Other Ambulatory Visit: Payer: Self-pay

## 2019-08-29 DIAGNOSIS — R0602 Shortness of breath: Secondary | ICD-10-CM | POA: Diagnosis present

## 2019-09-01 ENCOUNTER — Telehealth: Payer: Self-pay | Admitting: Interventional Cardiology

## 2019-09-01 DIAGNOSIS — F411 Generalized anxiety disorder: Secondary | ICD-10-CM | POA: Insufficient documentation

## 2019-09-01 DIAGNOSIS — F5101 Primary insomnia: Secondary | ICD-10-CM | POA: Insufficient documentation

## 2019-10-15 NOTE — Progress Notes (Deleted)
Cardiology Office Note   Date:  10/15/2019   ID:  Christian [name], Christian [name] 10-Dec-1940, MRN [id number]  PCP:  Wenda Low, MD    No chief complaint on file.  Angina pectoris  Wt Readings from Last 3 Encounters:  04/29/19 204 lb (92.5 kg)  01/14/19 204 lb (92.5 kg)  08/26/18 220 lb 12.8 oz (100.2 kg)       History of Present Illness: Christian [name] is a 79 y.o. male  male  with a hx of Diabetes, HTN, HL, Charcot-Marie-Tooth disease, peptic ulcer disease. He was evaluated for angina in 14-Dec-2012. He was set up for cardiac catheterization but was found to be anemic. Instead, he underwent GI evaluation with EGD which demonstrated gastric ulcers. His symptoms improved with improved hemoglobin.   A stress test was done in 8/18 which was negative for ischemia.   The left ventricular ejection fraction is normal (55-65%).  Nuclear stress EF: 59%.  There was no ST segment deviation noted during stress.  This is a low risk study.  Unfortunately, his wife passed away from [place] in Dec 15, 2018.  The patient is hearing impaired.   Past Medical History:  Diagnosis Date  . Angina pectoris ([place])    Nuclear stress test 8/18: EF 59, fixed inf-lat defect likely attenuation, no ischemia, Low Risk  . Arthritis   . CMT (Charcot-Marie-Tooth disease)   . Deafness   . Diverticulosis   . DM (diabetes mellitus) ([place])   . HLD (hyperlipidemia)   . HTN (hypertension)   . Iron deficiency anemia   . Multiple gastric ulcers   . Overactive bladder   . Ulcer     Past Surgical History:  Procedure Laterality Date  . COLONOSCOPY N/A 08/27/2013   Procedure: COLONOSCOPY;  Surgeon: Jerene Bears, MD;  Location: [facility];  Service: Gastroenterology;  Laterality: N/A;  . ESOPHAGOGASTRODUODENOSCOPY N/A 08/27/2013   Procedure: ESOPHAGOGASTRODUODENOSCOPY (EGD);  Surgeon: Jerene Bears, MD;  Location: [facility];  Service: Gastroenterology;  Laterality: N/A;  . FOOT AMPUTATION THROUGH METATARSAL Left   .  FOOT SURGERY     age 83  . TOTAL HIP ARTHROPLASTY Bilateral 2002, 2003  . TOTAL KNEE ARTHROPLASTY Bilateral 1999     Current Outpatient Medications  Medication Sig Dispense Refill  . amLODipine (NORVASC) 10 MG tablet Take 1 tablet (10 mg total) by mouth daily. 90 tablet 3  . Cholecalciferol (VITAMIN D) 1000 UNITS capsule Take 1,000 Units by mouth 3 (three) times a week. Dec 15, 2022, Wednesday, Saturday    . cyclobenzaprine (FLEXERIL) 10 MG tablet Take 10 mg by mouth 3 (three) times daily as needed for muscle spasms.     . diclofenac (VOLTAREN) 75 MG EC tablet Take 75 mg by mouth 2 (two) times daily as needed for pain.    Marland Kitchen dicyclomine (BENTYL) 20 MG tablet Take 1 tablet by mouth daily.    . fexofenadine (ALLEGRA) 180 MG tablet Take 180 mg by mouth daily as needed for allergies.     . GuaiFENesin (MUCINEX PO) Take 1 tablet by mouth daily as needed (to loosen phlegm).    . insulin glargine (LANTUS) 100 units/mL SOLN Inject 10 Units into the skin at bedtime.     . isosorbide mononitrate (IMDUR) 60 MG 24 hr tablet TAKE 1 TABLET BY MOUTH  DAILY 90 tablet 2  . LORazepam (ATIVAN) 2 MG tablet Take 2 mg by mouth at bedtime. Take one half tablet by mouth in the morning and one half tablet by  mouth in the evening     . metFORMIN (GLUCOPHAGE) 1000 MG tablet Take 1,000 mg by mouth 2 (two) times daily with a meal.    . nitroGLYCERIN (NITROSTAT) 0.4 MG SL tablet PLACE 1 TABLET (0.4 MG TOTAL) UNDER THE TONGUE EVERY 5 (FIVE) MINUTES AS NEEDED FOR CHEST PAIN. 75 tablet 1  . pantoprazole (PROTONIX) 40 MG tablet Take 1 tablet (40 mg total) by mouth daily. 90 tablet 2  . pioglitazone (ACTOS) 30 MG tablet Take 30 mg by mouth daily.    . polyethylene glycol (MIRALAX / GLYCOLAX) packet Take 17 g by mouth 2 (two) times daily as needed (constipation).     . predniSONE (DELTASONE) 10 MG tablet Take 2 tablets (20 mg total) by mouth daily with breakfast. 60 tablet 3  . rosuvastatin (CRESTOR) 10 MG tablet Take 10 mg by mouth  at bedtime.     . traMADol (ULTRAM) 50 MG tablet Take 1-2 tablet up to 3 times daly as needed for pain 100 tablet 0   Current Facility-Administered Medications  Medication Dose Route Frequency Provider Last Rate Last Admin  . cadexomer iodine (IODOSORB) 0.9 % gel   Topical Once per day on Mon Wed Fri [name], MD        Allergies:   Lipitor [atorvastatin]    Social History:  The patient  reports that he has never smoked. He has never used smokeless tobacco. He reports that he does not drink alcohol or use drugs.   Family History:  The patient's ***family history includes Breast cancer in his sister; Cervical cancer in his mother and sister; Diabetes in his brother and sister; Heart disease in his sister.    ROS:  Please see the history of present illness.   Otherwise, review of systems are positive for ***.   All other systems are reviewed and negative.    PHYSICAL EXAM: VS:  There were no vitals taken for this visit. , BMI There is no height or weight on file to calculate BMI. GEN: Well nourished, well developed, in no acute distress  HEENT: normal  Neck: no JVD, carotid bruits, or masses Cardiac: ***RRR; no murmurs, rubs, or gallops,no edema  Respiratory:  clear to auscultation bilaterally, normal work of breathing GI: soft, nontender, nondistended, + BS MS: no deformity or atrophy  Skin: warm and dry, no rash Neuro:  Strength and sensation are intact Psych: euthymic mood, full affect   EKG:   The ekg ordered today demonstrates ***   Recent Labs: No results found for requested labs within last 8760 hours.   Lipid Panel No results found for: CHOL, TRIG, HDL, CHOLHDL, VLDL, LDLCALC, LDLDIRECT   Other studies Reviewed: Additional studies/ records that were reviewed today with results demonstrating: ***.   ASSESSMENT AND PLAN:  1.   Angina pectoris: He has some known atherosclerosis.  He had a low risk stress test in 2018.  If he had further anginal symptoms while  his hemoglobin was in the normal range, would have to consider cardiac catheterization.  Prior angina was likely related to anemia. 2.   Hypertension: 3.   Hyperlipidemia: 4.   Type 2 diabetes:   Current medicines are reviewed at length with the patient today.  The patient concerns regarding his medicines were addressed.  The following changes have been made:  No change***  Labs/ tests ordered today include: *** No orders of the defined types were placed in this encounter.   Recommend 150 minutes/week of aerobic exercise  Low fat, low carb, high fiber diet recommended  Disposition:   FU in ***   Signed, Larae Grooms, MD  10/15/2019 11:02 PM    [facility] [place], [place], [place]  [address] Phone: [phone]; Fax: [phone]

## 2019-10-17 ENCOUNTER — Ambulatory Visit: Payer: Medicare Other | Admitting: Interventional Cardiology

## 2019-10-21 DIAGNOSIS — A419 Sepsis, unspecified organism: Secondary | ICD-10-CM | POA: Insufficient documentation

## 2019-10-27 DIAGNOSIS — J9601 Acute respiratory failure with hypoxia: Secondary | ICD-10-CM | POA: Insufficient documentation

## 2020-01-09 ENCOUNTER — Other Ambulatory Visit: Payer: Self-pay | Admitting: Interventional Cardiology

## 2020-01-29 NOTE — Progress Notes (Deleted)
Cardiology Office Note   Date:  01/29/2020   ID:  Christian [name], Christian [name] 08-Jan-1941, MRN [id number]  PCP:  Wenda Low, MD    No chief complaint on file.  Angina pectoris  Wt Readings from Last 3 Encounters:  04/29/19 204 lb (92.5 kg)  01/14/19 204 lb (92.5 kg)  08/26/18 220 lb 12.8 oz (100.2 kg)       History of Present Illness: Christian [name] is a 79 y.o. male  with a hx of Diabetes, HTN, HL, Charcot-Marie-Tooth disease, peptic ulcer disease. He was evaluated for angina in 12/03/2012. He was set up for cardiac catheterization but was found to be anemic. Instead, he underwent GI evaluation with EGD which demonstrated gastric ulcers. His symptoms improved with improved hemoglobin.  A stress test was done in 8/18 which was negative for ischemia.   The left ventricular ejection fraction is normal (55-65%).  Nuclear stress EF: 59%.  There was no ST segment deviation noted during stress. This is a low risk study  In 12/04/18, his wife passed away from [place].  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  He also had renal failure in 12/04/19.     Past Medical History:  Diagnosis Date  . Angina pectoris ([place])    Nuclear stress test 8/18: EF 59, fixed inf-lat defect likely attenuation, no ischemia, Low Risk  . Arthritis   . CMT (Charcot-Marie-Tooth disease)   . Deafness   . Diverticulosis   . DM (diabetes mellitus) ([place])   . HLD (hyperlipidemia)   . HTN (hypertension)   . Iron deficiency anemia   . Multiple gastric ulcers   . Overactive bladder   . Ulcer     Past Surgical History:  Procedure Laterality Date  . COLONOSCOPY N/A 08/27/2013   Procedure: COLONOSCOPY;  Surgeon: Jerene Bears, MD;  [facility];  Service: Gastroenterology;  Laterality: N/A;  . ESOPHAGOGASTRODUODENOSCOPY N/A 08/27/2013   Procedure: ESOPHAGOGASTRODUODENOSCOPY (EGD);  Surgeon: Jerene Bears, MD;  Location: [facility];  Service: Gastroenterology;   Laterality: N/A;  . FOOT AMPUTATION THROUGH METATARSAL Left   . FOOT SURGERY     age 16  . TOTAL HIP ARTHROPLASTY Bilateral 2002, 2003  . TOTAL KNEE ARTHROPLASTY Bilateral 1999     Current Outpatient Medications  Medication Sig Dispense Refill  . amLODipine (NORVASC) 10 MG tablet Take 1 tablet (10 mg total) by mouth daily. 90 tablet 3  . Cholecalciferol (VITAMIN D) 1000 UNITS capsule Take 1,000 Units by mouth 3 (three) times a week. Monday, Wednesday, Saturday    . cyclobenzaprine (FLEXERIL) 10 MG tablet Take 10 mg by mouth 3 (three) times daily as needed for muscle spasms.     . diclofenac (VOLTAREN) 75 MG EC tablet Take 75 mg by mouth 2 (two) times daily as needed for pain.    Marland Kitchen dicyclomine (BENTYL) 20 MG tablet Take 1 tablet by mouth daily.    . fexofenadine (ALLEGRA) 180 MG tablet Take 180 mg by mouth daily as needed for allergies.     . GuaiFENesin (MUCINEX PO) Take 1 tablet by mouth daily as needed (to loosen phlegm).    . insulin glargine (LANTUS) 100 units/mL SOLN Inject 10 Units into the skin at bedtime.     . isosorbide mononitrate (IMDUR) 60 MG 24 hr tablet TAKE 1 TABLET BY MOUTH  DAILY 90 tablet 2  . LORazepam (ATIVAN) 2 MG tablet Take 2 mg by mouth at bedtime.  Take one half tablet by mouth in the morning and one half tablet by mouth in the evening     . metFORMIN (GLUCOPHAGE) 1000 MG tablet Take 1,000 mg by mouth 2 (two) times daily with a meal.    . nitroGLYCERIN (NITROSTAT) 0.4 MG SL tablet Place 1 tablet (0.4 mg total) under the tongue every 5 (five) minutes as needed for chest pain. Please keep upcoming appt in May with Dr. Irish Lack. Thank you 75 tablet 0  . pantoprazole (PROTONIX) 40 MG tablet Take 1 tablet (40 mg total) by mouth daily. 90 tablet 2  . pioglitazone (ACTOS) 30 MG tablet Take 30 mg by mouth daily.    . polyethylene glycol (MIRALAX / GLYCOLAX) packet Take 17 g by mouth 2 (two) times daily as needed (constipation).     . predniSONE (DELTASONE) 10 MG tablet Take  2 tablets (20 mg total) by mouth daily with breakfast. 60 tablet 3  . rosuvastatin (CRESTOR) 10 MG tablet Take 10 mg by mouth at bedtime.     . traMADol (ULTRAM) 50 MG tablet Take 1-2 tablet up to 3 times daly as needed for pain 100 tablet 0   Current Facility-Administered Medications  Medication Dose Route Frequency Provider Last Rate Last Admin  . cadexomer iodine (IODOSORB) 0.9 % gel   Topical Once per day on Mon Wed Fri [name], MD        Allergies:   Lipitor [atorvastatin]    Social History:  The patient  reports that he has never smoked. He has never used smokeless tobacco. He reports that he does not drink alcohol or use drugs.   Family History:  The patient's ***family history includes Breast cancer in his sister; Cervical cancer in his mother and sister; Diabetes in his brother and sister; Heart disease in his sister.    ROS:  Please see the history of present illness.   Otherwise, review of systems are positive for ***.   All other systems are reviewed and negative.    PHYSICAL EXAM: VS:  There were no vitals taken for this visit. , BMI There is no height or weight on file to calculate BMI. GEN: Well nourished, well developed, in no acute distress  HEENT: normal  Neck: no JVD, carotid bruits, or masses Cardiac: ***RRR; no murmurs, rubs, or gallops,no edema  Respiratory:  clear to auscultation bilaterally, normal work of breathing GI: soft, nontender, nondistended, + BS MS: no deformity or atrophy  Skin: warm and dry, no rash Neuro:  Strength and sensation are intact Psych: euthymic mood, full affect   EKG:   The ekg ordered today demonstrates ***   Recent Labs: No results found for requested labs within last 8760 hours.   Lipid Panel No results found for: CHOL, TRIG, HDL, CHOLHDL, VLDL, LDLCALC, LDLDIRECT   Other studies Reviewed: Additional studies/ records that were reviewed today with results demonstrating: ***.   ASSESSMENT AND PLAN:  1. Angina  pectoris:  2. *** 3. ***   Current medicines are reviewed at length with the patient today.  The patient concerns regarding his medicines were addressed.  The following changes have been made:  No change***  Labs/ tests ordered today include: *** No orders of the defined types were placed in this encounter.   Recommend 150 minutes/week of aerobic exercise Low fat, low carb, high fiber diet recommended  Disposition:   FU in ***   Signed, Larae Grooms, MD  01/29/2020 11:30 PM    [facility] [place], [place], [place]  [address] Phone: [phone]; Fax: [phone]

## 2020-01-30 ENCOUNTER — Ambulatory Visit: Payer: Medicare Other | Admitting: Interventional Cardiology

## 2020-02-02 ENCOUNTER — Other Ambulatory Visit: Payer: Self-pay | Admitting: Interventional Cardiology

## 2020-02-12 NOTE — Progress Notes (Deleted)
Cardiology Office Note   Date:  02/12/2020   ID:  [name], DOB Jan 01, 1941, MRN [id number]  PCP:  Christian Low, MD    No chief complaint on file.  Angina pectoris  Wt Readings from Last 3 Encounters:  04/29/19 204 lb (92.5 kg)  01/14/19 204 lb (92.5 kg)  08/26/18 220 lb 12.8 oz (100.2 kg)       History of Present Illness: Christian [name] is a 79 y.o. male  with a hx of Diabetes, HTN, HL, Charcot-Marie-Tooth disease, peptic ulcer disease. He was evaluated for angina in 2012/12/12. He was set up for cardiac catheterization but was found to be anemic. Instead, he underwent GI evaluation with EGD which demonstrated gastric ulcers. His symptoms improved with improved hemoglobin.  A stress test was done in 8/18 which was negative for ischemia.   The left ventricular ejection fraction is normal (55-65%).  Nuclear stress EF: 59%.  There was no ST segment deviation noted during stress. This is a [name] risk study  In Dec 13, 2018, his wife passed away from [place].  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  He also had renal failure in Dec 13, 2019.     Past Medical History:  Diagnosis Date  . Angina pectoris ([place])    Nuclear stress test 8/18: EF 59, fixed inf-lat defect likely attenuation, no ischemia, [name] Risk  . Arthritis   . CMT (Charcot-Marie-Tooth disease)   . Deafness   . Diverticulosis   . DM (diabetes mellitus) ([place])   . HLD (hyperlipidemia)   . HTN (hypertension)   . Iron deficiency anemia   . Multiple gastric ulcers   . Overactive bladder   . Ulcer     Past Surgical History:  Procedure Laterality Date  . COLONOSCOPY N/A 08/27/2013   Procedure: COLONOSCOPY;  Surgeon: Jerene Bears, MD;  [facility]: [facility];  Service: Gastroenterology;  Laterality: N/A;  . ESOPHAGOGASTRODUODENOSCOPY N/A 08/27/2013   Procedure: ESOPHAGOGASTRODUODENOSCOPY (EGD);  Surgeon: Jerene Bears, MD;  Location: [facility];  Service: Gastroenterology;   Laterality: N/A;  . FOOT AMPUTATION THROUGH METATARSAL Left   . FOOT SURGERY     age 32  . TOTAL HIP ARTHROPLASTY Bilateral 2002, 2003  . TOTAL KNEE ARTHROPLASTY Bilateral 1999     Current Outpatient Medications  Medication Sig Dispense Refill  . amLODipine (NORVASC) 10 MG tablet TAKE 1 TABLET BY MOUTH  DAILY 90 tablet 3  . Cholecalciferol (VITAMIN D) 1000 UNITS capsule Take 1,000 Units by mouth 3 (three) times a week. Monday, Wednesday, Saturday    . cyclobenzaprine (FLEXERIL) 10 MG tablet Take 10 mg by mouth 3 (three) times daily as needed for muscle spasms.     . diclofenac (VOLTAREN) 75 MG EC tablet Take 75 mg by mouth 2 (two) times daily as needed for pain.    Marland Kitchen dicyclomine (BENTYL) 20 MG tablet Take 1 tablet by mouth daily.    . fexofenadine (ALLEGRA) 180 MG tablet Take 180 mg by mouth daily as needed for allergies.     . GuaiFENesin (MUCINEX PO) Take 1 tablet by mouth daily as needed (to loosen phlegm).    . insulin glargine (LANTUS) 100 units/mL SOLN Inject 10 Units into the skin at bedtime.     . isosorbide mononitrate (IMDUR) 60 MG 24 hr tablet TAKE 1 TABLET BY MOUTH  DAILY 90 tablet 2  . LORazepam (ATIVAN) 2 MG tablet Take 2 mg by mouth at bedtime. Take one  half tablet by mouth in the morning and one half tablet by mouth in the evening     . metFORMIN (GLUCOPHAGE) 1000 MG tablet Take 1,000 mg by mouth 2 (two) times daily with a meal.    . nitroGLYCERIN (NITROSTAT) 0.4 MG SL tablet Place 1 tablet (0.4 mg total) under the tongue every 5 (five) minutes as needed for chest pain. Please keep upcoming appt in May with Dr. Irish Lack. Thank you 75 tablet 0  . pantoprazole (PROTONIX) 40 MG tablet Take 1 tablet (40 mg total) by mouth daily. 90 tablet 2  . pioglitazone (ACTOS) 30 MG tablet Take 30 mg by mouth daily.    . polyethylene glycol (MIRALAX / GLYCOLAX) packet Take 17 g by mouth 2 (two) times daily as needed (constipation).     . predniSONE (DELTASONE) 10 MG tablet Take 2 tablets (20  mg total) by mouth daily with breakfast. 60 tablet 3  . rosuvastatin (CRESTOR) 10 MG tablet Take 10 mg by mouth at bedtime.     . traMADol (ULTRAM) 50 MG tablet Take 1-2 tablet up to 3 times daly as needed for pain 100 tablet 0   Current Facility-Administered Medications  Medication Dose Route Frequency Provider Last Rate Last Admin  . cadexomer iodine (IODOSORB) 0.9 % gel   Topical Once per day on Mon Wed Fri [name], MD        Allergies:   Lipitor [atorvastatin]    Social History:  The patient  reports that he has never smoked. He has never used smokeless tobacco. He reports that he does not drink alcohol or use drugs.   Family History:  The patient's ***family history includes Breast cancer in his sister; Cervical cancer in his mother and sister; Diabetes in his brother and sister; Heart disease in his sister.    ROS:  Please see the history of present illness.   Otherwise, review of systems are positive for ***.   All other systems are reviewed and negative.    PHYSICAL EXAM: VS:  There were no vitals taken for this visit. , BMI There is no height or weight on file to calculate BMI. GEN: Well nourished, well developed, in no acute distress  HEENT: normal  Neck: no JVD, carotid bruits, or masses Cardiac: ***RRR; no murmurs, rubs, or gallops,no edema  Respiratory:  clear to auscultation bilaterally, normal work of breathing GI: soft, nontender, nondistended, + BS MS: no deformity or atrophy  Skin: warm and dry, no rash Neuro:  Strength and sensation are intact Psych: euthymic mood, full affect   EKG:   The ekg ordered today demonstrates ***   Recent Labs: No results found for requested labs within last 8760 hours.   Lipid Panel No results found for: CHOL, TRIG, HDL, CHOLHDL, VLDL, LDLCALC, LDLDIRECT   Other studies Reviewed: Additional studies/ records that were reviewed today with results demonstrating: ***.   ASSESSMENT AND PLAN:  1. Angina pectoris:   2. *** 3. ***   Current medicines are reviewed at length with the patient today.  The patient concerns regarding his medicines were addressed.  The following changes have been made:  No change***  Labs/ tests ordered today include: *** No orders of the defined types were placed in this encounter.   Recommend 150 minutes/week of aerobic exercise [name] fat, [name] carb, high fiber diet recommended  Disposition:   FU in ***   Signed, Larae Grooms, MD  02/12/2020 4:52 PM    [facility]  [place], [place], [place]  [address] Phone: [phone]; Fax: [phone]

## 2020-02-13 ENCOUNTER — Ambulatory Visit: Payer: Medicare Other | Admitting: Interventional Cardiology

## 2020-02-26 ENCOUNTER — Telehealth: Payer: Self-pay | Admitting: Podiatry

## 2020-02-26 DIAGNOSIS — R1310 Dysphagia, unspecified: Secondary | ICD-10-CM | POA: Insufficient documentation

## 2020-02-26 DIAGNOSIS — Z789 Other specified health status: Secondary | ICD-10-CM | POA: Insufficient documentation

## 2020-02-26 DIAGNOSIS — Z7409 Other reduced mobility: Secondary | ICD-10-CM | POA: Insufficient documentation

## 2020-02-26 NOTE — Telephone Encounter (Signed)
Pt daughter was returning [employer].

## 2020-02-26 NOTE — Telephone Encounter (Signed)
I don't see that this patient has been seen here?!?

## 2020-02-27 ENCOUNTER — Other Ambulatory Visit: Payer: Self-pay | Admitting: Sports Medicine

## 2020-02-27 ENCOUNTER — Ambulatory Visit: Payer: Medicare Other | Admitting: Sports Medicine

## 2020-02-27 ENCOUNTER — Other Ambulatory Visit: Payer: Self-pay

## 2020-02-27 ENCOUNTER — Encounter: Payer: Self-pay | Admitting: Sports Medicine

## 2020-02-27 DIAGNOSIS — L97521 Non-pressure chronic ulcer of other part of left foot limited to breakdown of skin: Secondary | ICD-10-CM | POA: Diagnosis not present

## 2020-02-27 DIAGNOSIS — B351 Tinea unguium: Secondary | ICD-10-CM | POA: Diagnosis not present

## 2020-02-27 DIAGNOSIS — I739 Peripheral vascular disease, unspecified: Secondary | ICD-10-CM

## 2020-02-27 DIAGNOSIS — Z89432 Acquired absence of left foot: Secondary | ICD-10-CM

## 2020-02-27 DIAGNOSIS — M79674 Pain in right toe(s): Secondary | ICD-10-CM

## 2020-02-27 DIAGNOSIS — E1142 Type 2 diabetes mellitus with diabetic polyneuropathy: Secondary | ICD-10-CM

## 2020-02-27 DIAGNOSIS — M79672 Pain in left foot: Secondary | ICD-10-CM

## 2020-02-27 NOTE — Progress Notes (Signed)
Subjective: Christian [name] is a 79 y.o. male patient seen in office for evaluation of ulceration of the left foot. Patient has a history of diabetes and a blood glucose level today of 89 mg/dl, last A1c 6.2, PCP Dr. Radford Pax 12/12/19.   Patient is changing the dressing using medihoney and foam padding with help of nursing. Denies nausea/fever/vomiting/chills/night sweats/shortness of breath/pain. Patient is also assisted by daughter who reports that he needs the nails on right foot trimmed and needs to discuss shoes because his current shoes are really worn. Patient has no other pedal complaints at this time.  Sign language interpreter present.   Review of Systems  All other systems reviewed and are negative.    Patient Active Problem List   Diagnosis Date Noted  . Decreased activities of daily living (ADL) 02/26/2020  . Dysphagia 02/26/2020  . Impaired functional mobility, balance, gait, and endurance 02/26/2020  . Acute respiratory failure with hypoxia ([place]) 10/27/2019  . Sepsis ([place]) 10/21/2019  . Generalized anxiety disorder 09/01/2019  . Primary insomnia 09/01/2019  . GERD without esophagitis 07/23/2019  . Meningioma ([place]) 07/23/2019  . 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  . Ulcer of left foot, limited to breakdown of skin ([place]) 02/07/2018  . Chronic pain of both shoulders 08/09/2017  . Pain in right wrist 08/09/2017  . Atherosclerosis of renal artery ([place]) 04/24/2017  . Type 2 diabetes mellitus without complications ([place]) 93/81/8299  . Chronic left-sided low back pain with left-sided sciatica 07/24/2016  . Lower extremity edema 01/28/2015  . Angina pectoris ([place]) 07/08/2014  . Anemia, unspecified 05/06/2014  . Acute gastric ulcer 08/27/2013  . 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 IDA (iron deficiency anemia) 08/25/2013  . Mixed hyperlipidemia 08/13/2013  . Essential hypertension, benign 08/13/2013   Current Outpatient  Medications on File Prior to Visit  Medication Sig Dispense Refill  . iron polysaccharides (NIFEREX) 150 MG capsule Take by mouth.    . torsemide (DEMADEX) 20 MG tablet Take by mouth.    Marland Kitchen ACCU-CHEK SMARTVIEW test strip USE 1 STRIP TO CHECK GLUCOSE TWICE DAILY AS DIRECTED    . amLODipine (NORVASC) 10 MG tablet TAKE 1 TABLET BY MOUTH  DAILY 90 tablet 3  . Cholecalciferol (VITAMIN D) 1000 UNITS capsule Take 1,000 Units by mouth 3 (three) times a week. Monday, Wednesday, Saturday    . cyclobenzaprine (FLEXERIL) 10 MG tablet Take 10 mg by mouth 3 (three) times daily as needed for muscle spasms.     . diclofenac (VOLTAREN) 75 MG EC tablet Take 75 mg by mouth 2 (two) times daily as needed for pain.    Marland Kitchen dicyclomine (BENTYL) 20 MG tablet Take 1 tablet by mouth daily.    . fexofenadine (ALLEGRA) 180 MG tablet Take 180 mg by mouth daily as needed for allergies.     . GuaiFENesin (MUCINEX PO) Take 1 tablet by mouth daily as needed (to loosen phlegm).    . insulin glargine (LANTUS) 100 units/mL SOLN Inject 10 Units into the skin at bedtime.     . isosorbide mononitrate (IMDUR) 60 MG 24 hr tablet TAKE 1 TABLET BY MOUTH  DAILY 90 tablet 2  . LORazepam (ATIVAN) 1 MG tablet Take 1 mg by mouth daily as needed.    Marland Kitchen LORazepam (ATIVAN) 2 MG tablet Take 2 mg by mouth at bedtime. Take one half tablet by mouth in the morning and one half tablet by mouth in the evening     .  metFORMIN (GLUCOPHAGE) 1000 MG tablet Take 1,000 mg by mouth 2 (two) times daily with a meal.    . nitroGLYCERIN (NITROSTAT) 0.4 MG SL tablet Place 1 tablet (0.4 mg total) under the tongue every 5 (five) minutes as needed for chest pain. Please keep upcoming appt in May with Dr. Irish Lack. Thank you 75 tablet 0  . oxybutynin (DITROPAN-XL) 5 MG 24 hr tablet Take 5 mg by mouth daily.    . pantoprazole (PROTONIX) 40 MG tablet Take 1 tablet (40 mg total) by mouth daily. 90 tablet 2  . pioglitazone (ACTOS) 30 MG tablet Take 30 mg by mouth daily.    .  polyethylene glycol (MIRALAX / GLYCOLAX) packet Take 17 g by mouth 2 (two) times daily as needed (constipation).     . pravastatin (PRAVACHOL) 40 MG tablet Take 40 mg by mouth daily.    . predniSONE (DELTASONE) 10 MG tablet Take 2 tablets (20 mg total) by mouth daily with breakfast. 60 tablet 3  . rosuvastatin (CRESTOR) 10 MG tablet Take 10 mg by mouth at bedtime.     . traMADol (ULTRAM) 50 MG tablet Take 1-2 tablet up to 3 times daly as needed for pain 100 tablet 0   Current Facility-Administered Medications on File Prior to Visit  Medication Dose Route Frequency Provider Last Rate Last Admin  . cadexomer iodine (IODOSORB) 0.9 % gel   Topical Once per day on Mon Wed Fri [name], MD       Allergies  Allergen Reactions  . Lipitor [Atorvastatin] Other (See Comments)    No results found for this or any previous visit (from the past 2160 hour(s)).  Objective: There were no vitals filed for this visit.  General: Patient is awake, alert, oriented x 3 and in no acute distress.  Dermatology: Skin is warm and dry bilateral with a partial thickness ulceration present plantar left foot TMA stump site . Ulceration measures 2.5cm x 2.5 cm x 0.2 cm. There is a keratotic border with a granular base. The ulceration does not probe to bone. There is no malodor, no active drainage, no erythema, +1 edema. No acute signs of infection.   Vascular: Dorsalis Pedis pulse = 0/4 Bilateral,  Posterior Tibial pulse = 0/4 Bilateral,  Capillary Fill Time < 5 seconds on right, +1 pitting edema bilateral.  Neurologic: Protective sensation absent bilateral..  Musculosketal: Prominent metatarsal heads right foot, s/p L TMA. No Pain with palpation to ulcerated area on left. No pain with compression to calves bilateral.  No results for input(s): GRAMSTAIN, LABORGA in the last 8760 hours.  Assessment and Plan:  Problem List Items Addressed This Visit      Musculoskeletal and Integument   Ulcer of left foot,  limited to breakdown of skin ([place]) - Primary   Relevant Orders   WOUND CULTURE    Other Visit Diagnoses    PAD (peripheral artery disease) (HCC)       Relevant Medications   pravastatin (PRAVACHOL) 40 MG tablet   torsemide (DEMADEX) 20 MG tablet   Diabetic polyneuropathy associated with type 2 diabetes mellitus (HCC)       Relevant Medications   LORazepam (ATIVAN) 1 MG tablet   pravastatin (PRAVACHOL) 40 MG tablet   History of transmetatarsal amputation of left foot (HCC)       Pain due to onychomycosis of toenail of right foot           -Examined patient and discussed the progression of the wound and  treatment alternatives. -Xrays reviewed - Excisionally dedbrided ulceration at left plantar TMA stump site to healthy bleeding borders removing nonviable tissue using a sterile tissue nipper. Wound measures post debridement as above. Wound was debrided to the level of the dermis with viable wound base exposed to promote healing. Hemostasis was achieved with manuel pressure. Minimal bleeding noted. Patient tolerated procedure well without any discomfort or anesthesia necessary for this wound debridement.  -Wound culture obtained will call patient if need oral antibiotics -Applied PRISMA and dry sterile dressing and instructed patient to continue with dressings at home consisting of same with help from home nursing 2x per week; Orders sent to [facility] -Rx ABIs for surviellence in the setting of wound with nonpapable pulses - Advised patient to go to the ER or return to office if the wound worsens or if constitutional symptoms are present. -Mechanically debrided nails x 5 on right using sterile nail nipper without incident -Advised patient and daughter that at next visit we will arrange for him to meet Liliane Channel to see how we can better offload left foot ulcer and address the need for new shoes -Patient to return to office in 1 week for follow up care and evaluation or sooner if problems  arise.  Landis Martins, DPM

## 2020-03-01 ENCOUNTER — Telehealth: Payer: Self-pay | Admitting: *Deleted

## 2020-03-01 DIAGNOSIS — Z89432 Acquired absence of left foot: Secondary | ICD-10-CM

## 2020-03-01 DIAGNOSIS — I739 Peripheral vascular disease, unspecified: Secondary | ICD-10-CM

## 2020-03-01 DIAGNOSIS — L97521 Non-pressure chronic ulcer of other part of left foot limited to breakdown of skin: Secondary | ICD-10-CM

## 2020-03-01 NOTE — Telephone Encounter (Signed)
Faxed Dr. Leeanne Rio 02/27/2020 9:26pm orders to [facility]. [place] scheduled ABI with [facility] [phone] on 03/05/2020 arrive 1:30pm for 2:00pm testing.

## 2020-03-01 NOTE — Telephone Encounter (Signed)
-----   Message from Landis Martins, [place] sent at 02/27/2020  9:28 PM EDT ----- Regarding: ABIs at [facility] Left foot ulcer recurrent diabetic with history of TMA Nonpapable pulses

## 2020-03-01 NOTE — Telephone Encounter (Signed)
-----   Message from Landis Martins, [place] sent at 02/27/2020  9:26 PM EDT ----- Regarding: [facility] home nursing wound care Fax orders to [facility]: Prisma, 4x4, abd pad, and kerlix and coban or ace wrap 2x per week

## 2020-03-01 NOTE — Telephone Encounter (Signed)
I informed pt through interpreter of [facility] 03/05/2020 appt.

## 2020-03-02 ENCOUNTER — Telehealth: Payer: Self-pay

## 2020-03-02 ENCOUNTER — Other Ambulatory Visit: Payer: Self-pay | Admitting: Sports Medicine

## 2020-03-02 MED ORDER — SULFAMETHOXAZOLE-TRIMETHOPRIM 400-80 MG PO TABS: 1.0000 | ORAL_TABLET | Freq: Two times a day (BID) | ORAL | 0 refills | Status: DC

## 2020-03-02 NOTE — Telephone Encounter (Signed)
Called pt's number and LVM stating we have pt's wound cx results and Dr's recommendations and Rx, if they can return our phone call

## 2020-03-02 NOTE — Telephone Encounter (Signed)
-----   Message from Landis Martins, [place] sent at 03/02/2020  7:55 AM EDT ----- Will you let patient or his daughter know that I sent Bactrim antibiotic to his pharmacy, wound culture was + for MRSA (staph infection) Thanks Dr. Cannon Kettle

## 2020-03-02 NOTE — Telephone Encounter (Signed)
Called pt to review results and was able to give Dr's indications with the help of interpreter. Pt was advised of abx and wound cx results

## 2020-03-02 NOTE — Progress Notes (Signed)
Sent bactrim for + MRSA on wound culture. -Dr. Cannon Kettle

## 2020-03-05 ENCOUNTER — Ambulatory Visit (INDEPENDENT_AMBULATORY_CARE_PROVIDER_SITE_OTHER): Payer: Medicare Other | Admitting: Sports Medicine

## 2020-03-05 ENCOUNTER — Other Ambulatory Visit: Payer: Self-pay

## 2020-03-05 ENCOUNTER — Ambulatory Visit: Payer: Medicare Other | Admitting: Orthotics

## 2020-03-05 ENCOUNTER — Encounter: Payer: Self-pay | Admitting: Sports Medicine

## 2020-03-05 ENCOUNTER — Telehealth: Payer: Self-pay | Admitting: *Deleted

## 2020-03-05 DIAGNOSIS — I739 Peripheral vascular disease, unspecified: Secondary | ICD-10-CM

## 2020-03-05 DIAGNOSIS — Z89432 Acquired absence of left foot: Secondary | ICD-10-CM

## 2020-03-05 DIAGNOSIS — E1142 Type 2 diabetes mellitus with diabetic polyneuropathy: Secondary | ICD-10-CM

## 2020-03-05 DIAGNOSIS — L97521 Non-pressure chronic ulcer of other part of left foot limited to breakdown of skin: Secondary | ICD-10-CM | POA: Diagnosis not present

## 2020-03-05 LAB — WOUND CULTURE

## 2020-03-05 MED ORDER — GENTAMICIN SULFATE 0.1 % EX OINT: TOPICAL_OINTMENT | CUTANEOUS | 0 refills | Status: DC

## 2020-03-05 NOTE — Progress Notes (Signed)
Will not be able to cast today, has an active sub met ulcer on residual foot.

## 2020-03-05 NOTE — Telephone Encounter (Signed)
-----   Message from Landis Martins, [place] sent at 03/05/2020 12:39 PM EDT ----- Regarding: [facility] orders Gentamycin and dry dressing to left foot ulcer 2x per week and use offloading padding to help keep pressure off the wound (stick around wound) before covering with dry dressings -Dr Chauncey Cruel

## 2020-03-05 NOTE — Telephone Encounter (Signed)
Faxed copy of Dr. Leeanne Rio 03/05/2020 12:39pm orders to [facility].

## 2020-03-05 NOTE — Progress Notes (Signed)
Subjective: Christian [name] is a 79 y.o. male patient seen in office for follow-up evaluation of left foot ulcer.  Patient is by daughter and interpreter is present and has been seen by Liliane Channel as well.  Patient denies constitutional symptoms at this time. No other pedal complaints noted.  Patient Active Problem List   Diagnosis Date Noted  . Decreased activities of daily living (ADL) 02/26/2020  . Dysphagia 02/26/2020  . Impaired functional mobility, balance, gait, and endurance 02/26/2020  . Acute respiratory failure with hypoxia ([place]) 10/27/2019  . Sepsis ([place]) 10/21/2019  . Generalized anxiety disorder 09/01/2019  . Primary insomnia 09/01/2019  . GERD without esophagitis 07/23/2019  . Meningioma ([place]) 07/23/2019  . 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  . Ulcer of left foot, limited to breakdown of skin ([place]) 02/07/2018  . Chronic pain of both shoulders 08/09/2017  . Pain in right wrist 08/09/2017  . Atherosclerosis of renal artery ([place]) 04/24/2017  . Type 2 diabetes mellitus without complications ([place]) 76/54/6503  . Chronic left-sided low back pain with left-sided sciatica 07/24/2016  . Lower extremity edema 01/28/2015  . Angina pectoris ([place]) 07/08/2014  . Anemia, unspecified 05/06/2014  . Acute gastric ulcer 08/27/2013  . 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 IDA (iron deficiency anemia) 08/25/2013  . Mixed hyperlipidemia 08/13/2013  . Essential hypertension, benign 08/13/2013   Current Outpatient Medications on File Prior to Visit  Medication Sig Dispense Refill  . ACCU-CHEK SMARTVIEW test strip USE 1 STRIP TO CHECK GLUCOSE TWICE DAILY AS DIRECTED    . amLODipine (NORVASC) 10 MG tablet TAKE 1 TABLET BY MOUTH  DAILY 90 tablet 3  . Cholecalciferol (VITAMIN D) 1000 UNITS capsule Take 1,000 Units by mouth 3 (three) times a week. Monday, Wednesday, Saturday    . cyclobenzaprine (FLEXERIL) 10 MG tablet Take 10 mg by mouth 3 (three)  times daily as needed for muscle spasms.     . diclofenac (VOLTAREN) 75 MG EC tablet Take 75 mg by mouth 2 (two) times daily as needed for pain.    Marland Kitchen dicyclomine (BENTYL) 20 MG tablet Take 1 tablet by mouth daily.    . fexofenadine (ALLEGRA) 180 MG tablet Take 180 mg by mouth daily as needed for allergies.     . GuaiFENesin (MUCINEX PO) Take 1 tablet by mouth daily as needed (to loosen phlegm).    . insulin glargine (LANTUS) 100 units/mL SOLN Inject 10 Units into the skin at bedtime.     . iron polysaccharides (NIFEREX) 150 MG capsule Take by mouth.    . isosorbide mononitrate (IMDUR) 60 MG 24 hr tablet TAKE 1 TABLET BY MOUTH  DAILY 90 tablet 2  . LORazepam (ATIVAN) 1 MG tablet Take 1 mg by mouth daily as needed.    Marland Kitchen LORazepam (ATIVAN) 2 MG tablet Take 2 mg by mouth at bedtime. Take one half tablet by mouth in the morning and one half tablet by mouth in the evening     . metFORMIN (GLUCOPHAGE) 1000 MG tablet Take 1,000 mg by mouth 2 (two) times daily with a meal.    . nitroGLYCERIN (NITROSTAT) 0.4 MG SL tablet Place 1 tablet (0.4 mg total) under the tongue every 5 (five) minutes as needed for chest pain. Please keep upcoming appt in May with Dr. Irish Lack. Thank you 75 tablet 0  . oxybutynin (DITROPAN-XL) 5 MG 24 hr tablet Take 5 mg by mouth daily.    . pantoprazole (PROTONIX) 40 MG  tablet Take 1 tablet (40 mg total) by mouth daily. 90 tablet 2  . pioglitazone (ACTOS) 30 MG tablet Take 30 mg by mouth daily.    . polyethylene glycol (MIRALAX / GLYCOLAX) packet Take 17 g by mouth 2 (two) times daily as needed (constipation).     . pravastatin (PRAVACHOL) 40 MG tablet Take 40 mg by mouth daily.    . predniSONE (DELTASONE) 10 MG tablet Take 2 tablets (20 mg total) by mouth daily with breakfast. 60 tablet 3  . rosuvastatin (CRESTOR) 10 MG tablet Take 10 mg by mouth at bedtime.     . sulfamethoxazole-trimethoprim (BACTRIM) 400-80 MG tablet Take 1 tablet by mouth 2 (two) times daily. 28 tablet 0  .  torsemide (DEMADEX) 20 MG tablet Take by mouth.    . traMADol (ULTRAM) 50 MG tablet Take 1-2 tablet up to 3 times daly as needed for pain 100 tablet 0   Current Facility-Administered Medications on File Prior to Visit  Medication Dose Route Frequency Provider Last Rate Last Admin  . cadexomer iodine (IODOSORB) 0.9 % gel   Topical Once per day on Mon Wed Fri [name], MD       Allergies  Allergen Reactions  . Lipitor [Atorvastatin] Other (See Comments)    Recent Results (from the past 2160 hour(s))  WOUND CULTURE     Status: Abnormal   Collection Time: 02/27/20  1:55 PM   Specimen: Foot, Left; Wound   Wound Culture and sens  Result Value Ref Range   Gram Stain Result Final report    Organism ID, Bacteria Comment     Comment: No white blood cells seen.   Organism ID, Bacteria Comment     Comment: Few gram positive cocci   Aerobic Bacterial Culture Final report (A)    Organism ID, Bacteria Klebsiella pneumoniae (A)     Comment: Light growth   Organism ID, Bacteria Staphylococcus aureus (A)     Comment: Heavy growth Methicillin resistant (MRSA) Based on resistance to oxacillin this isolate would be resistant to all currently available beta-lactam antimicrobial agents, with the exception of the newer cephalosporins with anti-MRSA activity, such as Ceftaroline    Organism ID, Bacteria Mixed skin flora     Comment: Heavy growth   Antimicrobial Susceptibility Comment     Comment:       ** S = Susceptible; I = Intermediate; R = Resistant **                    P = Positive; N = Negative             MICS are expressed in micrograms per mL    Antibiotic                 RSLT#1    RSLT#2    RSLT#3    RSLT#4 Amoxicillin/Clavulanic Acid    S Ampicillin                     R Cefepime                       S Ceftriaxone                    S Cefuroxime                     S Ciprofloxacin  S         R Clindamycin                              R Ertapenem                       S Erythromycin                             R Gentamicin                     S         S Imipenem                       S Levofloxacin                   S         I Linezolid                                S Meropenem                      S Oxacillin                                R Penicillin                               R Piperacillin/Tazobactam        S Rifampin                                 S Tetracycline                   R         S Tobramycin                      S Trimethoprim/Sulfa             S         R Vancomycin                               S     Objective: There were no vitals filed for this visit.  General: Patient is awake, alert, oriented x 3 and in no acute distress.  Dermatology: Skin is warm and dry bilateral with a partial thickness ulceration present plantar left foot TMA stump site . Ulceration measures 2.5cm x 3 cm x 0.2 cm. There is a keratotic border with a granular base with macerated periwound drainage. The ulceration does not probe to bone. There is no malodor, no active drainage, no erythema, +1 edema. No acute signs of infection.   Vascular: Dorsalis Pedis pulse = 0/4 Bilateral,  Posterior Tibial pulse = 0/4 Bilateral,  Capillary Fill Time < 5 seconds on right, +1 pitting edema bilateral.  Neurologic: Protective sensation absent bilateral..  Musculosketal: Prominent metatarsal heads right foot, s/p L TMA. No Pain with palpation to ulcerated area on left. No pain with  compression to calves bilateral.  No results for input(s): GRAMSTAIN, LABORGA in the last 8760 hours.  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[place])       Diabetic polyneuropathy associated with type 2 diabetes mellitus ([place])            -Discussed again with patient and daughter the progression of the wound -Continue with Bactrim antibiotic until completed -Orders for home nursing to apply gentamicin to the wound with offloading padding in place patient in a surgical shoe this visit -Patient is diabetic in [facility] for customization of a surgical shoe to try to further offload the wound -Patient is also awaiting ABIs -Patient to return to office in 1 week for follow up care and evaluation or sooner if problems arise.  Landis Martins, DPM

## 2020-03-09 ENCOUNTER — Ambulatory Visit: Payer: Medicare Other | Admitting: Orthotics

## 2020-03-09 ENCOUNTER — Other Ambulatory Visit: Payer: Self-pay

## 2020-03-09 DIAGNOSIS — L97521 Non-pressure chronic ulcer of other part of left foot limited to breakdown of skin: Secondary | ICD-10-CM

## 2020-03-13 DIAGNOSIS — R112 Nausea with vomiting, unspecified: Secondary | ICD-10-CM | POA: Insufficient documentation

## 2020-03-16 DIAGNOSIS — K2100 Gastro-esophageal reflux disease with esophagitis, without bleeding: Secondary | ICD-10-CM | POA: Insufficient documentation

## 2020-04-02 ENCOUNTER — Other Ambulatory Visit: Payer: Medicare Other | Admitting: Orthotics

## 2020-04-13 ENCOUNTER — Other Ambulatory Visit: Payer: Self-pay | Admitting: Physician Assistant

## 2020-04-15 ENCOUNTER — Telehealth: Payer: Self-pay | Admitting: Interventional Cardiology

## 2020-04-15 MED ORDER — ISOSORBIDE MONONITRATE ER 60 MG PO TB24: 60.0000 mg | ORAL_TABLET | Freq: Every day | ORAL | 0 refills | Status: DC

## 2020-04-15 NOTE — Telephone Encounter (Signed)
Pt's medication was sent to pt's pharmacy as requested. Confirmation received.  °

## 2020-04-15 NOTE — Telephone Encounter (Signed)
*  STAT* If patient is at the pharmacy, call can be transferred to refill team.   1. Which medications need to be refilled? (please list name of each medication and dose if known) isosorbide mononitrate (IMDUR) 60 MG 24 hr tablet   2. Which pharmacy/location (including street and city if local pharmacy) is medication to be sent to?[place], [address] [employer], [address]   3. Do they need a 30 day or 90 day supply? [place]

## 2020-04-21 NOTE — Progress Notes (Signed)

## 2020-05-11 ENCOUNTER — Ambulatory Visit: Payer: Medicare Other | Admitting: Interventional Cardiology

## 2020-05-21 ENCOUNTER — Other Ambulatory Visit: Payer: Medicare Other | Admitting: Orthotics

## 2020-05-21 ENCOUNTER — Other Ambulatory Visit: Payer: Self-pay

## 2020-06-04 ENCOUNTER — Telehealth: Payer: Self-pay | Admitting: Sports Medicine

## 2020-06-04 NOTE — Telephone Encounter (Signed)
Received voicemail yesterday @ 457pm stating pt thinks he has an appt for a new brace tomorrow but not sure of time.   I returned call yesterday and it just rang.

## 2020-06-27 ENCOUNTER — Other Ambulatory Visit: Payer: Self-pay | Admitting: Interventional Cardiology

## 2020-07-08 NOTE — Progress Notes (Signed)
Cardiology Office Note   Date:  07/09/2020   ID:  Christian, [name] 10-04-1940, MRN [id number]  PCP:  [name], DO    No chief complaint on file.  Angina pectoris  Wt Readings from Last 3 Encounters:  07/09/20 185 lb 12.8 oz (84.3 kg)  04/29/19 204 lb (92.5 kg)  01/14/19 204 lb (92.5 kg)       History of Present Illness: Christian [name] is a 79 y.o. male  with a hx of Diabetes, HTN, HL, Charcot-Marie-Tooth disease, peptic ulcer disease. He was evaluated for angina in 2014. He was set up for cardiac catheterization but was found to be anemic. Instead, he underwent GI evaluation with EGD which demonstrated gastric ulcers. His symptoms improved with improved hemoglobin.  A stress test was done in 8/18 which was negative for ischemia.   The left ventricular ejection fraction is normal (55-65%).  Nuclear stress EF: 59%.  There was no ST segment deviation noted during stress.  This is a low risk study.  SInce the last visit, his wife passed away.    He had a bowel obstruction but this was managed conservatively.  He required dialysis temporarily in 2/21-4/21.  He lost weight.  Denies : Chest pain. Dizziness.  Nitroglycerin use. Orthopnea. Palpitations. Paroxysmal nocturnal dyspnea. Shortness of breath. Syncope.   Uses walker. He got a J&J vaccine.  He is willing to get a booster.  Ankle joint swelling improved.  Past Medical History:  Diagnosis Date  . Angina pectoris ([place])    Nuclear stress test 8/18: EF 59, fixed inf-lat defect likely attenuation, no ischemia, Low Risk  . Arthritis   . CMT (Charcot-Marie-Tooth disease)   . Deafness   . Diverticulosis   . DM (diabetes mellitus) ([place])   . HLD (hyperlipidemia)   . HTN (hypertension)   . Iron deficiency anemia   . Multiple gastric ulcers   . Overactive bladder   . Ulcer     Past Surgical History:  Procedure Laterality Date  . COLONOSCOPY N/A 08/27/2013   Procedure: COLONOSCOPY;  Surgeon:  Jerene Bears, MD;  Location: [facility];  Service: Gastroenterology;  Laterality: N/A;  . ESOPHAGOGASTRODUODENOSCOPY N/A 08/27/2013   Procedure: ESOPHAGOGASTRODUODENOSCOPY (EGD);  Surgeon: Jerene Bears, MD;  Location: [facility];  Service: Gastroenterology;  Laterality: N/A;  . FOOT AMPUTATION THROUGH METATARSAL Left   . FOOT SURGERY     age 47  . TOTAL HIP ARTHROPLASTY Bilateral 2002, 2003  . TOTAL KNEE ARTHROPLASTY Bilateral 1999     Current Outpatient Medications  Medication Sig Dispense Refill  . ACCU-CHEK SMARTVIEW test strip USE 1 STRIP TO CHECK GLUCOSE TWICE DAILY AS DIRECTED    . amLODipine (NORVASC) 10 MG tablet TAKE 1 TABLET BY MOUTH  DAILY 90 tablet 3  . Cholecalciferol (VITAMIN D) 1000 UNITS capsule Take 1,000 Units by mouth 3 (three) times a week. Monday, Wednesday, Saturday    . cyclobenzaprine (FLEXERIL) 10 MG tablet Take 10 mg by mouth 3 (three) times daily as needed for muscle spasms.     . diclofenac (VOLTAREN) 75 MG EC tablet Take 75 mg by mouth 2 (two) times daily as needed for pain.    Marland Kitchen dicyclomine (BENTYL) 20 MG tablet Take 1 tablet by mouth daily.    . fexofenadine (ALLEGRA) 180 MG tablet Take 180 mg by mouth daily as needed for allergies.     Marland Kitchen gentamicin ointment (GARAMYCIN) 0.1 % For  wound care on left foot 15  g 0  . GuaiFENesin (MUCINEX PO) Take 1 tablet by mouth daily as needed (to loosen phlegm).    . insulin glargine (LANTUS) 100 units/mL SOLN Inject 10 Units into the skin at bedtime.     . iron polysaccharides (NIFEREX) 150 MG capsule Take by mouth.    . isosorbide mononitrate (IMDUR) 60 MG 24 hr tablet Take 1 tablet (60 mg total) by mouth daily. Please keep upcoming appt in October with Dr. Irish Lack before anymore refills. Final Attempt 15 tablet 0  . LORazepam (ATIVAN) 1 MG tablet Take 1 mg by mouth daily as needed.    Marland Kitchen LORazepam (ATIVAN) 2 MG tablet Take 2 mg by mouth at bedtime. Take one half tablet by mouth in the morning and one half tablet by  mouth in the evening     . metFORMIN (GLUCOPHAGE) 1000 MG tablet Take 1,000 mg by mouth 2 (two) times daily with a meal.    . nitroGLYCERIN (NITROSTAT) 0.4 MG SL tablet Place 1 tablet (0.4 mg total) under the tongue every 5 (five) minutes as needed for chest pain. Please keep upcoming appt in May with Dr. Irish Lack. Thank you 75 tablet 0  . oxybutynin (DITROPAN-XL) 5 MG 24 hr tablet Take 5 mg by mouth daily.    . pantoprazole (PROTONIX) 40 MG tablet Take 1 tablet (40 mg total) by mouth daily. 90 tablet 2  . pioglitazone (ACTOS) 30 MG tablet Take 30 mg by mouth daily.    . polyethylene glycol (MIRALAX / GLYCOLAX) packet Take 17 g by mouth 2 (two) times daily as needed (constipation).     . pravastatin (PRAVACHOL) 40 MG tablet Take 40 mg by mouth daily.    . predniSONE (DELTASONE) 10 MG tablet Take 2 tablets (20 mg total) by mouth daily with breakfast. 60 tablet 3  . rosuvastatin (CRESTOR) 10 MG tablet Take 10 mg by mouth at bedtime.     . sulfamethoxazole-trimethoprim (BACTRIM) 400-80 MG tablet Take 1 tablet by mouth 2 (two) times daily. 28 tablet 0  . traMADol (ULTRAM) 50 MG tablet Take 1-2 tablet up to 3 times daly as needed for pain 100 tablet 0  . torsemide (DEMADEX) 20 MG tablet Take by mouth.     Current Facility-Administered Medications  Medication Dose Route Frequency Provider Last Rate Last Admin  . cadexomer iodine (IODOSORB) 0.9 % gel   Topical Once per day on Mon Wed Fri [name], MD        Allergies:   Lipitor [atorvastatin]    Social History:  The patient  reports that he has never smoked. He has never used smokeless tobacco. He reports that he does not drink alcohol and does not use drugs.   Family History:  The patient's family history includes Breast cancer in his sister; Cervical cancer in his mother and sister; Diabetes in his brother and sister; Heart disease in his sister.    ROS:  Please see the history of present illness.   Otherwise, review of systems are  positive for weight loss through his recent illnesses.   All other systems are reviewed and negative.    PHYSICAL EXAM: VS:  BP (!) 104/50   Pulse 76   Ht 5\' 8"  (1.727 m)   Wt 185 lb 12.8 oz (84.3 kg)   SpO2 95%   BMI 28.25 kg/m  , BMI Body mass index is 28.25 kg/m. GEN: Well nourished, well developed, in no acute distress  HEENT: normal  Neck: no JVD, carotid  bruits, or masses Cardiac: RRR; no murmurs, rubs, or gallops,no edema  Respiratory:  clear to auscultation bilaterally, normal work of breathing GI: soft, nontender, nondistended, + BS MS: no deformity or atrophy  Skin: warm and dry, no rash Neuro:  Strength and sensation are intact Psych: euthymic mood, full affect   EKG:   The ekg ordered today demonstrates NSR, no ST changes   Recent Labs: No results found for requested labs within last 8760 hours.   Lipid Panel No results found for: CHOL, TRIG, HDL, CHOLHDL, VLDL, LDLCALC, LDLDIRECT   Other studies Reviewed: Additional studies/ records that were reviewed today with results demonstrating: hospital records from [place] reviewed .   ASSESSMENT AND PLAN:  1. Angina pectoris: Resolved once his Hbg came up with medical therapy.  2. SHOB: stable.  Deconditioned due to ankle joint problems preventing activity.  3. HTN: The current medical regimen is effective;  continue present plan and medications. 4. Hyperlipidemia: LDL 70 in 220.  Checked with Dr. Radford Pax. On pravastatin.  5. Type 2 DM: A1C 6.5 in 2020.  Followed with PMD.    Current medicines are reviewed at length with the patient today.  The patient concerns regarding his medicines were addressed.  The following changes have been made:  No change  Labs/ tests ordered today include:  No orders of the defined types were placed in this encounter.   Recommend 150 minutes/week of aerobic exercise Low fat, low carb, high fiber diet recommended  Disposition:   FU in 1 year   Signed, Christian Grooms,  MD  07/09/2020 9:26 AM    [facility] [name], [place], [place]  [address] Phone: [phone]; Fax: [phone]

## 2020-07-09 ENCOUNTER — Ambulatory Visit: Payer: Medicare Other | Admitting: Interventional Cardiology

## 2020-07-09 ENCOUNTER — Other Ambulatory Visit: Payer: Self-pay

## 2020-07-09 ENCOUNTER — Encounter: Payer: Self-pay | Admitting: Interventional Cardiology

## 2020-07-09 VITALS — BP 104/50 | HR 76 | Ht 68.0 in | Wt 185.8 lb

## 2020-07-09 DIAGNOSIS — I209 Angina pectoris, unspecified: Secondary | ICD-10-CM

## 2020-07-09 DIAGNOSIS — I10 Essential (primary) hypertension: Secondary | ICD-10-CM

## 2020-07-09 DIAGNOSIS — Z794 Long term (current) use of insulin: Secondary | ICD-10-CM

## 2020-07-09 DIAGNOSIS — E119 Type 2 diabetes mellitus without complications: Secondary | ICD-10-CM | POA: Diagnosis not present

## 2020-07-09 DIAGNOSIS — E782 Mixed hyperlipidemia: Secondary | ICD-10-CM | POA: Diagnosis not present

## 2020-07-09 DIAGNOSIS — R0602 Shortness of breath: Secondary | ICD-10-CM

## 2020-07-09 NOTE — Patient Instructions (Signed)

## 2020-08-25 ENCOUNTER — Other Ambulatory Visit: Payer: Self-pay

## 2020-08-25 MED ORDER — ISOSORBIDE MONONITRATE ER 60 MG PO TB24: 60.0000 mg | ORAL_TABLET | Freq: Every day | ORAL | 3 refills | Status: DC

## 2020-09-11 DIAGNOSIS — N1831 Chronic kidney disease, stage 3a: Secondary | ICD-10-CM | POA: Insufficient documentation

## 2020-09-11 DIAGNOSIS — N183 Chronic kidney disease, stage 3 unspecified: Secondary | ICD-10-CM | POA: Insufficient documentation

## 2020-09-13 DIAGNOSIS — Z743 Need for continuous supervision: Secondary | ICD-10-CM | POA: Diagnosis not present

## 2020-09-13 DIAGNOSIS — E1165 Type 2 diabetes mellitus with hyperglycemia: Secondary | ICD-10-CM | POA: Diagnosis not present

## 2020-09-13 DIAGNOSIS — R079 Chest pain, unspecified: Secondary | ICD-10-CM | POA: Diagnosis not present

## 2020-09-13 DIAGNOSIS — R0789 Other chest pain: Secondary | ICD-10-CM | POA: Diagnosis not present

## 2020-09-13 DIAGNOSIS — R0602 Shortness of breath: Secondary | ICD-10-CM | POA: Diagnosis not present

## 2020-09-14 DIAGNOSIS — R000 Tachycardia, unspecified: Secondary | ICD-10-CM | POA: Diagnosis not present

## 2020-09-14 DIAGNOSIS — I25119 Atherosclerotic heart disease of native coronary artery with unspecified angina pectoris: Secondary | ICD-10-CM | POA: Diagnosis not present

## 2020-09-14 DIAGNOSIS — Z602 Problems related to living alone: Secondary | ICD-10-CM | POA: Diagnosis not present

## 2020-09-14 DIAGNOSIS — S72435A Nondisplaced fracture of medial condyle of left femur, initial encounter for closed fracture: Secondary | ICD-10-CM | POA: Diagnosis not present

## 2020-09-14 DIAGNOSIS — E1151 Type 2 diabetes mellitus with diabetic peripheral angiopathy without gangrene: Secondary | ICD-10-CM | POA: Diagnosis not present

## 2020-09-14 DIAGNOSIS — M25551 Pain in right hip: Secondary | ICD-10-CM | POA: Diagnosis not present

## 2020-09-14 DIAGNOSIS — E1122 Type 2 diabetes mellitus with diabetic chronic kidney disease: Secondary | ICD-10-CM | POA: Diagnosis not present

## 2020-09-14 DIAGNOSIS — S82002A Unspecified fracture of left patella, initial encounter for closed fracture: Secondary | ICD-10-CM | POA: Diagnosis not present

## 2020-09-14 DIAGNOSIS — I214 Non-ST elevation (NSTEMI) myocardial infarction: Secondary | ICD-10-CM | POA: Diagnosis not present

## 2020-09-14 DIAGNOSIS — R0600 Dyspnea, unspecified: Secondary | ICD-10-CM | POA: Diagnosis not present

## 2020-09-14 DIAGNOSIS — S72432A Displaced fracture of medial condyle of left femur, initial encounter for closed fracture: Secondary | ICD-10-CM | POA: Diagnosis not present

## 2020-09-14 DIAGNOSIS — M25561 Pain in right knee: Secondary | ICD-10-CM | POA: Diagnosis not present

## 2020-09-14 DIAGNOSIS — Z9989 Dependence on other enabling machines and devices: Secondary | ICD-10-CM | POA: Diagnosis not present

## 2020-09-14 DIAGNOSIS — E785 Hyperlipidemia, unspecified: Secondary | ICD-10-CM | POA: Diagnosis not present

## 2020-09-14 DIAGNOSIS — M9712XA Periprosthetic fracture around internal prosthetic left knee joint, initial encounter: Secondary | ICD-10-CM | POA: Diagnosis not present

## 2020-09-14 DIAGNOSIS — M25461 Effusion, right knee: Secondary | ICD-10-CM | POA: Diagnosis not present

## 2020-09-14 DIAGNOSIS — J9811 Atelectasis: Secondary | ICD-10-CM | POA: Diagnosis not present

## 2020-09-14 DIAGNOSIS — G9389 Other specified disorders of brain: Secondary | ICD-10-CM | POA: Diagnosis not present

## 2020-09-14 DIAGNOSIS — R0602 Shortness of breath: Secondary | ICD-10-CM | POA: Diagnosis not present

## 2020-09-14 DIAGNOSIS — Z96653 Presence of artificial knee joint, bilateral: Secondary | ICD-10-CM | POA: Diagnosis not present

## 2020-09-14 DIAGNOSIS — E782 Mixed hyperlipidemia: Secondary | ICD-10-CM | POA: Diagnosis not present

## 2020-09-14 DIAGNOSIS — Z79899 Other long term (current) drug therapy: Secondary | ICD-10-CM | POA: Diagnosis not present

## 2020-09-14 DIAGNOSIS — Z23 Encounter for immunization: Secondary | ICD-10-CM | POA: Diagnosis not present

## 2020-09-14 DIAGNOSIS — I10 Essential (primary) hypertension: Secondary | ICD-10-CM | POA: Diagnosis not present

## 2020-09-14 DIAGNOSIS — I700 Atherosclerosis of aorta: Secondary | ICD-10-CM | POA: Diagnosis not present

## 2020-09-14 DIAGNOSIS — R079 Chest pain, unspecified: Secondary | ICD-10-CM | POA: Diagnosis not present

## 2020-09-14 DIAGNOSIS — W19XXXA Unspecified fall, initial encounter: Secondary | ICD-10-CM | POA: Diagnosis not present

## 2020-09-14 DIAGNOSIS — K219 Gastro-esophageal reflux disease without esophagitis: Secondary | ICD-10-CM | POA: Diagnosis not present

## 2020-09-14 DIAGNOSIS — M25462 Effusion, left knee: Secondary | ICD-10-CM | POA: Diagnosis not present

## 2020-09-14 DIAGNOSIS — Z539 Procedure and treatment not carried out, unspecified reason: Secondary | ICD-10-CM | POA: Diagnosis not present

## 2020-09-14 DIAGNOSIS — M25562 Pain in left knee: Secondary | ICD-10-CM | POA: Diagnosis not present

## 2020-09-14 DIAGNOSIS — E119 Type 2 diabetes mellitus without complications: Secondary | ICD-10-CM | POA: Diagnosis not present

## 2020-09-14 DIAGNOSIS — I340 Nonrheumatic mitral (valve) insufficiency: Secondary | ICD-10-CM | POA: Diagnosis not present

## 2020-09-14 DIAGNOSIS — R220 Localized swelling, mass and lump, head: Secondary | ICD-10-CM | POA: Diagnosis not present

## 2020-09-14 DIAGNOSIS — I708 Atherosclerosis of other arteries: Secondary | ICD-10-CM | POA: Diagnosis not present

## 2020-09-14 DIAGNOSIS — R778 Other specified abnormalities of plasma proteins: Secondary | ICD-10-CM | POA: Diagnosis not present

## 2020-09-14 DIAGNOSIS — J811 Chronic pulmonary edema: Secondary | ICD-10-CM | POA: Diagnosis not present

## 2020-09-14 DIAGNOSIS — Z20822 Contact with and (suspected) exposure to covid-19: Secondary | ICD-10-CM | POA: Diagnosis not present

## 2020-09-14 DIAGNOSIS — S72302A Unspecified fracture of shaft of left femur, initial encounter for closed fracture: Secondary | ICD-10-CM | POA: Diagnosis not present

## 2020-09-14 DIAGNOSIS — Y92009 Unspecified place in unspecified non-institutional (private) residence as the place of occurrence of the external cause: Secondary | ICD-10-CM | POA: Diagnosis not present

## 2020-09-14 DIAGNOSIS — H9190 Unspecified hearing loss, unspecified ear: Secondary | ICD-10-CM | POA: Diagnosis not present

## 2020-09-14 DIAGNOSIS — E1142 Type 2 diabetes mellitus with diabetic polyneuropathy: Secondary | ICD-10-CM | POA: Diagnosis not present

## 2020-09-14 DIAGNOSIS — I739 Peripheral vascular disease, unspecified: Secondary | ICD-10-CM | POA: Diagnosis not present

## 2020-09-14 DIAGNOSIS — I129 Hypertensive chronic kidney disease with stage 1 through stage 4 chronic kidney disease, or unspecified chronic kidney disease: Secondary | ICD-10-CM | POA: Diagnosis not present

## 2020-09-14 DIAGNOSIS — M170 Bilateral primary osteoarthritis of knee: Secondary | ICD-10-CM | POA: Diagnosis not present

## 2020-09-14 DIAGNOSIS — M25861 Other specified joint disorders, right knee: Secondary | ICD-10-CM | POA: Diagnosis not present

## 2020-09-14 DIAGNOSIS — Z96642 Presence of left artificial hip joint: Secondary | ICD-10-CM | POA: Diagnosis not present

## 2020-09-15 DIAGNOSIS — I10 Essential (primary) hypertension: Secondary | ICD-10-CM | POA: Diagnosis not present

## 2020-09-15 DIAGNOSIS — H9190 Unspecified hearing loss, unspecified ear: Secondary | ICD-10-CM | POA: Diagnosis not present

## 2020-09-15 DIAGNOSIS — R000 Tachycardia, unspecified: Secondary | ICD-10-CM | POA: Diagnosis not present

## 2020-09-15 DIAGNOSIS — M170 Bilateral primary osteoarthritis of knee: Secondary | ICD-10-CM | POA: Diagnosis not present

## 2020-09-15 DIAGNOSIS — E872 Acidosis: Secondary | ICD-10-CM | POA: Diagnosis not present

## 2020-09-15 DIAGNOSIS — Z79899 Other long term (current) drug therapy: Secondary | ICD-10-CM | POA: Diagnosis not present

## 2020-09-15 DIAGNOSIS — J811 Chronic pulmonary edema: Secondary | ICD-10-CM | POA: Diagnosis not present

## 2020-09-15 DIAGNOSIS — Z602 Problems related to living alone: Secondary | ICD-10-CM | POA: Diagnosis not present

## 2020-09-15 DIAGNOSIS — Z20822 Contact with and (suspected) exposure to covid-19: Secondary | ICD-10-CM | POA: Diagnosis not present

## 2020-09-15 DIAGNOSIS — E1142 Type 2 diabetes mellitus with diabetic polyneuropathy: Secondary | ICD-10-CM | POA: Diagnosis not present

## 2020-09-15 DIAGNOSIS — Z96653 Presence of artificial knee joint, bilateral: Secondary | ICD-10-CM | POA: Diagnosis not present

## 2020-09-15 DIAGNOSIS — I214 Non-ST elevation (NSTEMI) myocardial infarction: Secondary | ICD-10-CM | POA: Diagnosis not present

## 2020-09-15 DIAGNOSIS — Z539 Procedure and treatment not carried out, unspecified reason: Secondary | ICD-10-CM | POA: Diagnosis not present

## 2020-09-15 DIAGNOSIS — Z23 Encounter for immunization: Secondary | ICD-10-CM | POA: Diagnosis not present

## 2020-09-15 DIAGNOSIS — E1122 Type 2 diabetes mellitus with diabetic chronic kidney disease: Secondary | ICD-10-CM | POA: Diagnosis not present

## 2020-09-15 DIAGNOSIS — I25119 Atherosclerotic heart disease of native coronary artery with unspecified angina pectoris: Secondary | ICD-10-CM | POA: Diagnosis not present

## 2020-09-15 DIAGNOSIS — I129 Hypertensive chronic kidney disease with stage 1 through stage 4 chronic kidney disease, or unspecified chronic kidney disease: Secondary | ICD-10-CM | POA: Diagnosis not present

## 2020-09-15 DIAGNOSIS — E782 Mixed hyperlipidemia: Secondary | ICD-10-CM | POA: Diagnosis not present

## 2020-09-15 DIAGNOSIS — E1151 Type 2 diabetes mellitus with diabetic peripheral angiopathy without gangrene: Secondary | ICD-10-CM | POA: Diagnosis not present

## 2020-09-15 DIAGNOSIS — S72435A Nondisplaced fracture of medial condyle of left femur, initial encounter for closed fracture: Secondary | ICD-10-CM | POA: Diagnosis not present

## 2020-09-15 DIAGNOSIS — E119 Type 2 diabetes mellitus without complications: Secondary | ICD-10-CM | POA: Diagnosis not present

## 2020-09-15 DIAGNOSIS — K219 Gastro-esophageal reflux disease without esophagitis: Secondary | ICD-10-CM | POA: Diagnosis not present

## 2020-09-15 DIAGNOSIS — Z9989 Dependence on other enabling machines and devices: Secondary | ICD-10-CM | POA: Diagnosis not present

## 2020-09-15 DIAGNOSIS — I700 Atherosclerosis of aorta: Secondary | ICD-10-CM | POA: Diagnosis not present

## 2020-09-16 DIAGNOSIS — E782 Mixed hyperlipidemia: Secondary | ICD-10-CM | POA: Diagnosis not present

## 2020-09-16 DIAGNOSIS — E119 Type 2 diabetes mellitus without complications: Secondary | ICD-10-CM | POA: Diagnosis not present

## 2020-09-16 DIAGNOSIS — M9712XA Periprosthetic fracture around internal prosthetic left knee joint, initial encounter: Secondary | ICD-10-CM | POA: Diagnosis not present

## 2020-09-16 DIAGNOSIS — I214 Non-ST elevation (NSTEMI) myocardial infarction: Secondary | ICD-10-CM | POA: Diagnosis not present

## 2020-09-16 DIAGNOSIS — K219 Gastro-esophageal reflux disease without esophagitis: Secondary | ICD-10-CM | POA: Diagnosis not present

## 2020-09-16 DIAGNOSIS — I10 Essential (primary) hypertension: Secondary | ICD-10-CM | POA: Diagnosis not present

## 2020-09-17 DIAGNOSIS — J811 Chronic pulmonary edema: Secondary | ICD-10-CM | POA: Diagnosis not present

## 2020-09-17 DIAGNOSIS — I10 Essential (primary) hypertension: Secondary | ICD-10-CM | POA: Diagnosis not present

## 2020-09-17 DIAGNOSIS — Z9181 History of falling: Secondary | ICD-10-CM | POA: Diagnosis not present

## 2020-09-17 DIAGNOSIS — E1142 Type 2 diabetes mellitus with diabetic polyneuropathy: Secondary | ICD-10-CM | POA: Diagnosis not present

## 2020-09-17 DIAGNOSIS — E782 Mixed hyperlipidemia: Secondary | ICD-10-CM | POA: Diagnosis not present

## 2020-09-17 DIAGNOSIS — K5900 Constipation, unspecified: Secondary | ICD-10-CM | POA: Diagnosis not present

## 2020-09-17 DIAGNOSIS — Z79899 Other long term (current) drug therapy: Secondary | ICD-10-CM | POA: Diagnosis not present

## 2020-09-17 DIAGNOSIS — Z794 Long term (current) use of insulin: Secondary | ICD-10-CM | POA: Diagnosis not present

## 2020-09-17 DIAGNOSIS — M25562 Pain in left knee: Secondary | ICD-10-CM | POA: Diagnosis not present

## 2020-09-17 DIAGNOSIS — M6281 Muscle weakness (generalized): Secondary | ICD-10-CM | POA: Diagnosis not present

## 2020-09-17 DIAGNOSIS — R339 Retention of urine, unspecified: Secondary | ICD-10-CM | POA: Diagnosis not present

## 2020-09-17 DIAGNOSIS — I25119 Atherosclerotic heart disease of native coronary artery with unspecified angina pectoris: Secondary | ICD-10-CM | POA: Diagnosis not present

## 2020-09-17 DIAGNOSIS — M170 Bilateral primary osteoarthritis of knee: Secondary | ICD-10-CM | POA: Diagnosis not present

## 2020-09-17 DIAGNOSIS — H913 Deaf nonspeaking, not elsewhere classified: Secondary | ICD-10-CM | POA: Diagnosis not present

## 2020-09-17 DIAGNOSIS — N3281 Overactive bladder: Secondary | ICD-10-CM | POA: Diagnosis not present

## 2020-09-17 DIAGNOSIS — I129 Hypertensive chronic kidney disease with stage 1 through stage 4 chronic kidney disease, or unspecified chronic kidney disease: Secondary | ICD-10-CM | POA: Diagnosis not present

## 2020-09-17 DIAGNOSIS — D649 Anemia, unspecified: Secondary | ICD-10-CM | POA: Diagnosis not present

## 2020-09-17 DIAGNOSIS — E1122 Type 2 diabetes mellitus with diabetic chronic kidney disease: Secondary | ICD-10-CM | POA: Diagnosis not present

## 2020-09-17 DIAGNOSIS — Z602 Problems related to living alone: Secondary | ICD-10-CM | POA: Diagnosis not present

## 2020-09-17 DIAGNOSIS — E785 Hyperlipidemia, unspecified: Secondary | ICD-10-CM | POA: Diagnosis not present

## 2020-09-17 DIAGNOSIS — E1151 Type 2 diabetes mellitus with diabetic peripheral angiopathy without gangrene: Secondary | ICD-10-CM | POA: Diagnosis not present

## 2020-09-17 DIAGNOSIS — H9190 Unspecified hearing loss, unspecified ear: Secondary | ICD-10-CM | POA: Diagnosis not present

## 2020-09-17 DIAGNOSIS — Z539 Procedure and treatment not carried out, unspecified reason: Secondary | ICD-10-CM | POA: Diagnosis not present

## 2020-09-17 DIAGNOSIS — R262 Difficulty in walking, not elsewhere classified: Secondary | ICD-10-CM | POA: Diagnosis not present

## 2020-09-17 DIAGNOSIS — Z23 Encounter for immunization: Secondary | ICD-10-CM | POA: Diagnosis not present

## 2020-09-17 DIAGNOSIS — Z20822 Contact with and (suspected) exposure to covid-19: Secondary | ICD-10-CM | POA: Diagnosis not present

## 2020-09-17 DIAGNOSIS — S72435A Nondisplaced fracture of medial condyle of left femur, initial encounter for closed fracture: Secondary | ICD-10-CM | POA: Diagnosis not present

## 2020-09-17 DIAGNOSIS — R000 Tachycardia, unspecified: Secondary | ICD-10-CM | POA: Diagnosis not present

## 2020-09-17 DIAGNOSIS — Z96653 Presence of artificial knee joint, bilateral: Secondary | ICD-10-CM | POA: Diagnosis not present

## 2020-09-17 DIAGNOSIS — E119 Type 2 diabetes mellitus without complications: Secondary | ICD-10-CM | POA: Diagnosis not present

## 2020-09-17 DIAGNOSIS — I214 Non-ST elevation (NSTEMI) myocardial infarction: Secondary | ICD-10-CM | POA: Diagnosis not present

## 2020-09-17 DIAGNOSIS — F32A Depression, unspecified: Secondary | ICD-10-CM | POA: Diagnosis not present

## 2020-09-17 DIAGNOSIS — S82125D Nondisplaced fracture of lateral condyle of left tibia, subsequent encounter for closed fracture with routine healing: Secondary | ICD-10-CM | POA: Diagnosis not present

## 2020-09-17 DIAGNOSIS — I700 Atherosclerosis of aorta: Secondary | ICD-10-CM | POA: Diagnosis not present

## 2020-09-17 DIAGNOSIS — Z9989 Dependence on other enabling machines and devices: Secondary | ICD-10-CM | POA: Diagnosis not present

## 2020-09-17 DIAGNOSIS — L97419 Non-pressure chronic ulcer of right heel and midfoot with unspecified severity: Secondary | ICD-10-CM | POA: Diagnosis not present

## 2020-09-17 DIAGNOSIS — K219 Gastro-esophageal reflux disease without esophagitis: Secondary | ICD-10-CM | POA: Diagnosis not present

## 2020-10-04 DIAGNOSIS — L97419 Non-pressure chronic ulcer of right heel and midfoot with unspecified severity: Secondary | ICD-10-CM | POA: Diagnosis not present

## 2020-10-06 DIAGNOSIS — M25562 Pain in left knee: Secondary | ICD-10-CM | POA: Diagnosis not present

## 2020-10-11 DIAGNOSIS — L97419 Non-pressure chronic ulcer of right heel and midfoot with unspecified severity: Secondary | ICD-10-CM | POA: Diagnosis not present

## 2020-10-18 DIAGNOSIS — K5900 Constipation, unspecified: Secondary | ICD-10-CM | POA: Diagnosis not present

## 2020-10-18 DIAGNOSIS — N183 Chronic kidney disease, stage 3 unspecified: Secondary | ICD-10-CM | POA: Diagnosis not present

## 2020-10-18 DIAGNOSIS — M19041 Primary osteoarthritis, right hand: Secondary | ICD-10-CM | POA: Diagnosis not present

## 2020-10-18 DIAGNOSIS — F32A Depression, unspecified: Secondary | ICD-10-CM | POA: Diagnosis not present

## 2020-10-18 DIAGNOSIS — W19XXXD Unspecified fall, subsequent encounter: Secondary | ICD-10-CM | POA: Diagnosis not present

## 2020-10-18 DIAGNOSIS — R339 Retention of urine, unspecified: Secondary | ICD-10-CM | POA: Diagnosis not present

## 2020-10-18 DIAGNOSIS — I252 Old myocardial infarction: Secondary | ICD-10-CM | POA: Diagnosis not present

## 2020-10-18 DIAGNOSIS — E785 Hyperlipidemia, unspecified: Secondary | ICD-10-CM | POA: Diagnosis not present

## 2020-10-18 DIAGNOSIS — I129 Hypertensive chronic kidney disease with stage 1 through stage 4 chronic kidney disease, or unspecified chronic kidney disease: Secondary | ICD-10-CM | POA: Diagnosis not present

## 2020-10-18 DIAGNOSIS — I2510 Atherosclerotic heart disease of native coronary artery without angina pectoris: Secondary | ICD-10-CM | POA: Diagnosis not present

## 2020-10-18 DIAGNOSIS — E1122 Type 2 diabetes mellitus with diabetic chronic kidney disease: Secondary | ICD-10-CM | POA: Diagnosis not present

## 2020-10-18 DIAGNOSIS — D631 Anemia in chronic kidney disease: Secondary | ICD-10-CM | POA: Diagnosis not present

## 2020-10-18 DIAGNOSIS — M19042 Primary osteoarthritis, left hand: Secondary | ICD-10-CM | POA: Diagnosis not present

## 2020-10-18 DIAGNOSIS — I700 Atherosclerosis of aorta: Secondary | ICD-10-CM | POA: Diagnosis not present

## 2020-10-18 DIAGNOSIS — N3281 Overactive bladder: Secondary | ICD-10-CM | POA: Diagnosis not present

## 2020-10-18 DIAGNOSIS — H913 Deaf nonspeaking, not elsewhere classified: Secondary | ICD-10-CM | POA: Diagnosis not present

## 2020-10-18 DIAGNOSIS — S82125D Nondisplaced fracture of lateral condyle of left tibia, subsequent encounter for closed fracture with routine healing: Secondary | ICD-10-CM | POA: Diagnosis not present

## 2020-10-18 DIAGNOSIS — K219 Gastro-esophageal reflux disease without esophagitis: Secondary | ICD-10-CM | POA: Diagnosis not present

## 2020-10-18 DIAGNOSIS — M47812 Spondylosis without myelopathy or radiculopathy, cervical region: Secondary | ICD-10-CM | POA: Diagnosis not present

## 2020-10-19 DIAGNOSIS — N3281 Overactive bladder: Secondary | ICD-10-CM | POA: Diagnosis not present

## 2020-10-19 DIAGNOSIS — I700 Atherosclerosis of aorta: Secondary | ICD-10-CM | POA: Diagnosis not present

## 2020-10-19 DIAGNOSIS — M47812 Spondylosis without myelopathy or radiculopathy, cervical region: Secondary | ICD-10-CM | POA: Diagnosis not present

## 2020-10-19 DIAGNOSIS — R339 Retention of urine, unspecified: Secondary | ICD-10-CM | POA: Diagnosis not present

## 2020-10-19 DIAGNOSIS — E1122 Type 2 diabetes mellitus with diabetic chronic kidney disease: Secondary | ICD-10-CM | POA: Diagnosis not present

## 2020-10-19 DIAGNOSIS — I129 Hypertensive chronic kidney disease with stage 1 through stage 4 chronic kidney disease, or unspecified chronic kidney disease: Secondary | ICD-10-CM | POA: Diagnosis not present

## 2020-10-19 DIAGNOSIS — M19042 Primary osteoarthritis, left hand: Secondary | ICD-10-CM | POA: Diagnosis not present

## 2020-10-19 DIAGNOSIS — D631 Anemia in chronic kidney disease: Secondary | ICD-10-CM | POA: Diagnosis not present

## 2020-10-19 DIAGNOSIS — S82125D Nondisplaced fracture of lateral condyle of left tibia, subsequent encounter for closed fracture with routine healing: Secondary | ICD-10-CM | POA: Diagnosis not present

## 2020-10-19 DIAGNOSIS — K5900 Constipation, unspecified: Secondary | ICD-10-CM | POA: Diagnosis not present

## 2020-10-19 DIAGNOSIS — H913 Deaf nonspeaking, not elsewhere classified: Secondary | ICD-10-CM | POA: Diagnosis not present

## 2020-10-19 DIAGNOSIS — K219 Gastro-esophageal reflux disease without esophagitis: Secondary | ICD-10-CM | POA: Diagnosis not present

## 2020-10-19 DIAGNOSIS — M19041 Primary osteoarthritis, right hand: Secondary | ICD-10-CM | POA: Diagnosis not present

## 2020-10-19 DIAGNOSIS — I252 Old myocardial infarction: Secondary | ICD-10-CM | POA: Diagnosis not present

## 2020-10-19 DIAGNOSIS — N183 Chronic kidney disease, stage 3 unspecified: Secondary | ICD-10-CM | POA: Diagnosis not present

## 2020-10-19 DIAGNOSIS — W19XXXD Unspecified fall, subsequent encounter: Secondary | ICD-10-CM | POA: Diagnosis not present

## 2020-10-19 DIAGNOSIS — F32A Depression, unspecified: Secondary | ICD-10-CM | POA: Diagnosis not present

## 2020-10-19 DIAGNOSIS — I2510 Atherosclerotic heart disease of native coronary artery without angina pectoris: Secondary | ICD-10-CM | POA: Diagnosis not present

## 2020-10-19 DIAGNOSIS — E785 Hyperlipidemia, unspecified: Secondary | ICD-10-CM | POA: Diagnosis not present

## 2020-10-25 DIAGNOSIS — I2510 Atherosclerotic heart disease of native coronary artery without angina pectoris: Secondary | ICD-10-CM | POA: Diagnosis not present

## 2020-10-25 DIAGNOSIS — S82125D Nondisplaced fracture of lateral condyle of left tibia, subsequent encounter for closed fracture with routine healing: Secondary | ICD-10-CM | POA: Diagnosis not present

## 2020-10-25 DIAGNOSIS — D631 Anemia in chronic kidney disease: Secondary | ICD-10-CM | POA: Diagnosis not present

## 2020-10-25 DIAGNOSIS — M19042 Primary osteoarthritis, left hand: Secondary | ICD-10-CM | POA: Diagnosis not present

## 2020-10-25 DIAGNOSIS — K219 Gastro-esophageal reflux disease without esophagitis: Secondary | ICD-10-CM | POA: Diagnosis not present

## 2020-10-25 DIAGNOSIS — W19XXXD Unspecified fall, subsequent encounter: Secondary | ICD-10-CM | POA: Diagnosis not present

## 2020-10-25 DIAGNOSIS — F32A Depression, unspecified: Secondary | ICD-10-CM | POA: Diagnosis not present

## 2020-10-25 DIAGNOSIS — E1122 Type 2 diabetes mellitus with diabetic chronic kidney disease: Secondary | ICD-10-CM | POA: Diagnosis not present

## 2020-10-25 DIAGNOSIS — I700 Atherosclerosis of aorta: Secondary | ICD-10-CM | POA: Diagnosis not present

## 2020-10-25 DIAGNOSIS — I252 Old myocardial infarction: Secondary | ICD-10-CM | POA: Diagnosis not present

## 2020-10-25 DIAGNOSIS — I129 Hypertensive chronic kidney disease with stage 1 through stage 4 chronic kidney disease, or unspecified chronic kidney disease: Secondary | ICD-10-CM | POA: Diagnosis not present

## 2020-10-25 DIAGNOSIS — R339 Retention of urine, unspecified: Secondary | ICD-10-CM | POA: Diagnosis not present

## 2020-10-25 DIAGNOSIS — M47812 Spondylosis without myelopathy or radiculopathy, cervical region: Secondary | ICD-10-CM | POA: Diagnosis not present

## 2020-10-25 DIAGNOSIS — E785 Hyperlipidemia, unspecified: Secondary | ICD-10-CM | POA: Diagnosis not present

## 2020-10-25 DIAGNOSIS — H913 Deaf nonspeaking, not elsewhere classified: Secondary | ICD-10-CM | POA: Diagnosis not present

## 2020-10-25 DIAGNOSIS — K5900 Constipation, unspecified: Secondary | ICD-10-CM | POA: Diagnosis not present

## 2020-10-25 DIAGNOSIS — N183 Chronic kidney disease, stage 3 unspecified: Secondary | ICD-10-CM | POA: Diagnosis not present

## 2020-10-25 DIAGNOSIS — N3281 Overactive bladder: Secondary | ICD-10-CM | POA: Diagnosis not present

## 2020-10-25 DIAGNOSIS — M19041 Primary osteoarthritis, right hand: Secondary | ICD-10-CM | POA: Diagnosis not present

## 2020-10-27 DIAGNOSIS — M47812 Spondylosis without myelopathy or radiculopathy, cervical region: Secondary | ICD-10-CM | POA: Diagnosis not present

## 2020-10-27 DIAGNOSIS — M19042 Primary osteoarthritis, left hand: Secondary | ICD-10-CM | POA: Diagnosis not present

## 2020-10-27 DIAGNOSIS — I2510 Atherosclerotic heart disease of native coronary artery without angina pectoris: Secondary | ICD-10-CM | POA: Diagnosis not present

## 2020-10-27 DIAGNOSIS — D631 Anemia in chronic kidney disease: Secondary | ICD-10-CM | POA: Diagnosis not present

## 2020-10-27 DIAGNOSIS — S82125D Nondisplaced fracture of lateral condyle of left tibia, subsequent encounter for closed fracture with routine healing: Secondary | ICD-10-CM | POA: Diagnosis not present

## 2020-10-27 DIAGNOSIS — I700 Atherosclerosis of aorta: Secondary | ICD-10-CM | POA: Diagnosis not present

## 2020-10-27 DIAGNOSIS — N183 Chronic kidney disease, stage 3 unspecified: Secondary | ICD-10-CM | POA: Diagnosis not present

## 2020-10-27 DIAGNOSIS — H913 Deaf nonspeaking, not elsewhere classified: Secondary | ICD-10-CM | POA: Diagnosis not present

## 2020-10-27 DIAGNOSIS — E785 Hyperlipidemia, unspecified: Secondary | ICD-10-CM | POA: Diagnosis not present

## 2020-10-27 DIAGNOSIS — W19XXXD Unspecified fall, subsequent encounter: Secondary | ICD-10-CM | POA: Diagnosis not present

## 2020-10-27 DIAGNOSIS — K219 Gastro-esophageal reflux disease without esophagitis: Secondary | ICD-10-CM | POA: Diagnosis not present

## 2020-10-27 DIAGNOSIS — F32A Depression, unspecified: Secondary | ICD-10-CM | POA: Diagnosis not present

## 2020-10-27 DIAGNOSIS — I129 Hypertensive chronic kidney disease with stage 1 through stage 4 chronic kidney disease, or unspecified chronic kidney disease: Secondary | ICD-10-CM | POA: Diagnosis not present

## 2020-10-27 DIAGNOSIS — K5900 Constipation, unspecified: Secondary | ICD-10-CM | POA: Diagnosis not present

## 2020-10-27 DIAGNOSIS — N3281 Overactive bladder: Secondary | ICD-10-CM | POA: Diagnosis not present

## 2020-10-27 DIAGNOSIS — I252 Old myocardial infarction: Secondary | ICD-10-CM | POA: Diagnosis not present

## 2020-10-27 DIAGNOSIS — M19041 Primary osteoarthritis, right hand: Secondary | ICD-10-CM | POA: Diagnosis not present

## 2020-10-27 DIAGNOSIS — E1122 Type 2 diabetes mellitus with diabetic chronic kidney disease: Secondary | ICD-10-CM | POA: Diagnosis not present

## 2020-10-27 DIAGNOSIS — R339 Retention of urine, unspecified: Secondary | ICD-10-CM | POA: Diagnosis not present

## 2020-11-01 DIAGNOSIS — I129 Hypertensive chronic kidney disease with stage 1 through stage 4 chronic kidney disease, or unspecified chronic kidney disease: Secondary | ICD-10-CM | POA: Diagnosis not present

## 2020-11-01 DIAGNOSIS — I700 Atherosclerosis of aorta: Secondary | ICD-10-CM | POA: Diagnosis not present

## 2020-11-01 DIAGNOSIS — K219 Gastro-esophageal reflux disease without esophagitis: Secondary | ICD-10-CM | POA: Diagnosis not present

## 2020-11-01 DIAGNOSIS — I252 Old myocardial infarction: Secondary | ICD-10-CM | POA: Diagnosis not present

## 2020-11-01 DIAGNOSIS — E1122 Type 2 diabetes mellitus with diabetic chronic kidney disease: Secondary | ICD-10-CM | POA: Diagnosis not present

## 2020-11-01 DIAGNOSIS — M19041 Primary osteoarthritis, right hand: Secondary | ICD-10-CM | POA: Diagnosis not present

## 2020-11-01 DIAGNOSIS — D631 Anemia in chronic kidney disease: Secondary | ICD-10-CM | POA: Diagnosis not present

## 2020-11-01 DIAGNOSIS — K5900 Constipation, unspecified: Secondary | ICD-10-CM | POA: Diagnosis not present

## 2020-11-01 DIAGNOSIS — M47812 Spondylosis without myelopathy or radiculopathy, cervical region: Secondary | ICD-10-CM | POA: Diagnosis not present

## 2020-11-01 DIAGNOSIS — N183 Chronic kidney disease, stage 3 unspecified: Secondary | ICD-10-CM | POA: Diagnosis not present

## 2020-11-01 DIAGNOSIS — E785 Hyperlipidemia, unspecified: Secondary | ICD-10-CM | POA: Diagnosis not present

## 2020-11-01 DIAGNOSIS — W19XXXD Unspecified fall, subsequent encounter: Secondary | ICD-10-CM | POA: Diagnosis not present

## 2020-11-01 DIAGNOSIS — S82125D Nondisplaced fracture of lateral condyle of left tibia, subsequent encounter for closed fracture with routine healing: Secondary | ICD-10-CM | POA: Diagnosis not present

## 2020-11-01 DIAGNOSIS — R339 Retention of urine, unspecified: Secondary | ICD-10-CM | POA: Diagnosis not present

## 2020-11-01 DIAGNOSIS — M19042 Primary osteoarthritis, left hand: Secondary | ICD-10-CM | POA: Diagnosis not present

## 2020-11-01 DIAGNOSIS — H913 Deaf nonspeaking, not elsewhere classified: Secondary | ICD-10-CM | POA: Diagnosis not present

## 2020-11-01 DIAGNOSIS — F32A Depression, unspecified: Secondary | ICD-10-CM | POA: Diagnosis not present

## 2020-11-01 DIAGNOSIS — N3281 Overactive bladder: Secondary | ICD-10-CM | POA: Diagnosis not present

## 2020-11-01 DIAGNOSIS — I2510 Atherosclerotic heart disease of native coronary artery without angina pectoris: Secondary | ICD-10-CM | POA: Diagnosis not present

## 2020-11-03 DIAGNOSIS — M19042 Primary osteoarthritis, left hand: Secondary | ICD-10-CM | POA: Diagnosis not present

## 2020-11-03 DIAGNOSIS — N183 Chronic kidney disease, stage 3 unspecified: Secondary | ICD-10-CM | POA: Diagnosis not present

## 2020-11-03 DIAGNOSIS — M47812 Spondylosis without myelopathy or radiculopathy, cervical region: Secondary | ICD-10-CM | POA: Diagnosis not present

## 2020-11-03 DIAGNOSIS — F32A Depression, unspecified: Secondary | ICD-10-CM | POA: Diagnosis not present

## 2020-11-03 DIAGNOSIS — H913 Deaf nonspeaking, not elsewhere classified: Secondary | ICD-10-CM | POA: Diagnosis not present

## 2020-11-03 DIAGNOSIS — W19XXXD Unspecified fall, subsequent encounter: Secondary | ICD-10-CM | POA: Diagnosis not present

## 2020-11-03 DIAGNOSIS — K219 Gastro-esophageal reflux disease without esophagitis: Secondary | ICD-10-CM | POA: Diagnosis not present

## 2020-11-03 DIAGNOSIS — I252 Old myocardial infarction: Secondary | ICD-10-CM | POA: Diagnosis not present

## 2020-11-03 DIAGNOSIS — N3281 Overactive bladder: Secondary | ICD-10-CM | POA: Diagnosis not present

## 2020-11-03 DIAGNOSIS — K5900 Constipation, unspecified: Secondary | ICD-10-CM | POA: Diagnosis not present

## 2020-11-03 DIAGNOSIS — R339 Retention of urine, unspecified: Secondary | ICD-10-CM | POA: Diagnosis not present

## 2020-11-03 DIAGNOSIS — S82125D Nondisplaced fracture of lateral condyle of left tibia, subsequent encounter for closed fracture with routine healing: Secondary | ICD-10-CM | POA: Diagnosis not present

## 2020-11-03 DIAGNOSIS — I129 Hypertensive chronic kidney disease with stage 1 through stage 4 chronic kidney disease, or unspecified chronic kidney disease: Secondary | ICD-10-CM | POA: Diagnosis not present

## 2020-11-03 DIAGNOSIS — E1122 Type 2 diabetes mellitus with diabetic chronic kidney disease: Secondary | ICD-10-CM | POA: Diagnosis not present

## 2020-11-03 DIAGNOSIS — I700 Atherosclerosis of aorta: Secondary | ICD-10-CM | POA: Diagnosis not present

## 2020-11-03 DIAGNOSIS — I2510 Atherosclerotic heart disease of native coronary artery without angina pectoris: Secondary | ICD-10-CM | POA: Diagnosis not present

## 2020-11-03 DIAGNOSIS — M19041 Primary osteoarthritis, right hand: Secondary | ICD-10-CM | POA: Diagnosis not present

## 2020-11-03 DIAGNOSIS — E785 Hyperlipidemia, unspecified: Secondary | ICD-10-CM | POA: Diagnosis not present

## 2020-11-03 DIAGNOSIS — D631 Anemia in chronic kidney disease: Secondary | ICD-10-CM | POA: Diagnosis not present

## 2020-11-05 DIAGNOSIS — I129 Hypertensive chronic kidney disease with stage 1 through stage 4 chronic kidney disease, or unspecified chronic kidney disease: Secondary | ICD-10-CM | POA: Diagnosis not present

## 2020-11-05 DIAGNOSIS — I701 Atherosclerosis of renal artery: Secondary | ICD-10-CM | POA: Diagnosis not present

## 2020-11-05 DIAGNOSIS — M7989 Other specified soft tissue disorders: Secondary | ICD-10-CM | POA: Diagnosis not present

## 2020-11-05 DIAGNOSIS — N1831 Chronic kidney disease, stage 3a: Secondary | ICD-10-CM | POA: Diagnosis not present

## 2020-11-05 DIAGNOSIS — R2242 Localized swelling, mass and lump, left lower limb: Secondary | ICD-10-CM | POA: Diagnosis not present

## 2020-11-05 DIAGNOSIS — R2241 Localized swelling, mass and lump, right lower limb: Secondary | ICD-10-CM | POA: Diagnosis not present

## 2020-11-05 DIAGNOSIS — I872 Venous insufficiency (chronic) (peripheral): Secondary | ICD-10-CM | POA: Diagnosis not present

## 2020-11-09 DIAGNOSIS — M47812 Spondylosis without myelopathy or radiculopathy, cervical region: Secondary | ICD-10-CM | POA: Diagnosis not present

## 2020-11-09 DIAGNOSIS — N183 Chronic kidney disease, stage 3 unspecified: Secondary | ICD-10-CM | POA: Diagnosis not present

## 2020-11-09 DIAGNOSIS — M19042 Primary osteoarthritis, left hand: Secondary | ICD-10-CM | POA: Diagnosis not present

## 2020-11-09 DIAGNOSIS — I129 Hypertensive chronic kidney disease with stage 1 through stage 4 chronic kidney disease, or unspecified chronic kidney disease: Secondary | ICD-10-CM | POA: Diagnosis not present

## 2020-11-09 DIAGNOSIS — F32A Depression, unspecified: Secondary | ICD-10-CM | POA: Diagnosis not present

## 2020-11-09 DIAGNOSIS — I700 Atherosclerosis of aorta: Secondary | ICD-10-CM | POA: Diagnosis not present

## 2020-11-09 DIAGNOSIS — I252 Old myocardial infarction: Secondary | ICD-10-CM | POA: Diagnosis not present

## 2020-11-09 DIAGNOSIS — R339 Retention of urine, unspecified: Secondary | ICD-10-CM | POA: Diagnosis not present

## 2020-11-09 DIAGNOSIS — I2510 Atherosclerotic heart disease of native coronary artery without angina pectoris: Secondary | ICD-10-CM | POA: Diagnosis not present

## 2020-11-09 DIAGNOSIS — M19041 Primary osteoarthritis, right hand: Secondary | ICD-10-CM | POA: Diagnosis not present

## 2020-11-09 DIAGNOSIS — K219 Gastro-esophageal reflux disease without esophagitis: Secondary | ICD-10-CM | POA: Diagnosis not present

## 2020-11-09 DIAGNOSIS — S82125D Nondisplaced fracture of lateral condyle of left tibia, subsequent encounter for closed fracture with routine healing: Secondary | ICD-10-CM | POA: Diagnosis not present

## 2020-11-09 DIAGNOSIS — E785 Hyperlipidemia, unspecified: Secondary | ICD-10-CM | POA: Diagnosis not present

## 2020-11-09 DIAGNOSIS — N3281 Overactive bladder: Secondary | ICD-10-CM | POA: Diagnosis not present

## 2020-11-09 DIAGNOSIS — D631 Anemia in chronic kidney disease: Secondary | ICD-10-CM | POA: Diagnosis not present

## 2020-11-09 DIAGNOSIS — H913 Deaf nonspeaking, not elsewhere classified: Secondary | ICD-10-CM | POA: Diagnosis not present

## 2020-11-09 DIAGNOSIS — K5900 Constipation, unspecified: Secondary | ICD-10-CM | POA: Diagnosis not present

## 2020-11-09 DIAGNOSIS — E1122 Type 2 diabetes mellitus with diabetic chronic kidney disease: Secondary | ICD-10-CM | POA: Diagnosis not present

## 2020-11-09 DIAGNOSIS — W19XXXD Unspecified fall, subsequent encounter: Secondary | ICD-10-CM | POA: Diagnosis not present

## 2020-11-10 DIAGNOSIS — M47812 Spondylosis without myelopathy or radiculopathy, cervical region: Secondary | ICD-10-CM | POA: Diagnosis not present

## 2020-11-10 DIAGNOSIS — I129 Hypertensive chronic kidney disease with stage 1 through stage 4 chronic kidney disease, or unspecified chronic kidney disease: Secondary | ICD-10-CM | POA: Diagnosis not present

## 2020-11-10 DIAGNOSIS — I252 Old myocardial infarction: Secondary | ICD-10-CM | POA: Diagnosis not present

## 2020-11-10 DIAGNOSIS — E1122 Type 2 diabetes mellitus with diabetic chronic kidney disease: Secondary | ICD-10-CM | POA: Diagnosis not present

## 2020-11-10 DIAGNOSIS — N183 Chronic kidney disease, stage 3 unspecified: Secondary | ICD-10-CM | POA: Diagnosis not present

## 2020-11-10 DIAGNOSIS — K5900 Constipation, unspecified: Secondary | ICD-10-CM | POA: Diagnosis not present

## 2020-11-10 DIAGNOSIS — I700 Atherosclerosis of aorta: Secondary | ICD-10-CM | POA: Diagnosis not present

## 2020-11-10 DIAGNOSIS — M19041 Primary osteoarthritis, right hand: Secondary | ICD-10-CM | POA: Diagnosis not present

## 2020-11-10 DIAGNOSIS — E785 Hyperlipidemia, unspecified: Secondary | ICD-10-CM | POA: Diagnosis not present

## 2020-11-10 DIAGNOSIS — K219 Gastro-esophageal reflux disease without esophagitis: Secondary | ICD-10-CM | POA: Diagnosis not present

## 2020-11-10 DIAGNOSIS — R339 Retention of urine, unspecified: Secondary | ICD-10-CM | POA: Diagnosis not present

## 2020-11-10 DIAGNOSIS — F32A Depression, unspecified: Secondary | ICD-10-CM | POA: Diagnosis not present

## 2020-11-10 DIAGNOSIS — D631 Anemia in chronic kidney disease: Secondary | ICD-10-CM | POA: Diagnosis not present

## 2020-11-10 DIAGNOSIS — I2510 Atherosclerotic heart disease of native coronary artery without angina pectoris: Secondary | ICD-10-CM | POA: Diagnosis not present

## 2020-11-10 DIAGNOSIS — S82125D Nondisplaced fracture of lateral condyle of left tibia, subsequent encounter for closed fracture with routine healing: Secondary | ICD-10-CM | POA: Diagnosis not present

## 2020-11-10 DIAGNOSIS — W19XXXD Unspecified fall, subsequent encounter: Secondary | ICD-10-CM | POA: Diagnosis not present

## 2020-11-10 DIAGNOSIS — M19042 Primary osteoarthritis, left hand: Secondary | ICD-10-CM | POA: Diagnosis not present

## 2020-11-10 DIAGNOSIS — H913 Deaf nonspeaking, not elsewhere classified: Secondary | ICD-10-CM | POA: Diagnosis not present

## 2020-11-10 DIAGNOSIS — N3281 Overactive bladder: Secondary | ICD-10-CM | POA: Diagnosis not present

## 2020-11-11 DIAGNOSIS — R339 Retention of urine, unspecified: Secondary | ICD-10-CM | POA: Diagnosis not present

## 2020-11-11 DIAGNOSIS — M19041 Primary osteoarthritis, right hand: Secondary | ICD-10-CM | POA: Diagnosis not present

## 2020-11-11 DIAGNOSIS — K219 Gastro-esophageal reflux disease without esophagitis: Secondary | ICD-10-CM | POA: Diagnosis not present

## 2020-11-11 DIAGNOSIS — M19042 Primary osteoarthritis, left hand: Secondary | ICD-10-CM | POA: Diagnosis not present

## 2020-11-11 DIAGNOSIS — K5900 Constipation, unspecified: Secondary | ICD-10-CM | POA: Diagnosis not present

## 2020-11-11 DIAGNOSIS — D631 Anemia in chronic kidney disease: Secondary | ICD-10-CM | POA: Diagnosis not present

## 2020-11-11 DIAGNOSIS — I129 Hypertensive chronic kidney disease with stage 1 through stage 4 chronic kidney disease, or unspecified chronic kidney disease: Secondary | ICD-10-CM | POA: Diagnosis not present

## 2020-11-11 DIAGNOSIS — N3281 Overactive bladder: Secondary | ICD-10-CM | POA: Diagnosis not present

## 2020-11-11 DIAGNOSIS — E1122 Type 2 diabetes mellitus with diabetic chronic kidney disease: Secondary | ICD-10-CM | POA: Diagnosis not present

## 2020-11-11 DIAGNOSIS — E785 Hyperlipidemia, unspecified: Secondary | ICD-10-CM | POA: Diagnosis not present

## 2020-11-11 DIAGNOSIS — H913 Deaf nonspeaking, not elsewhere classified: Secondary | ICD-10-CM | POA: Diagnosis not present

## 2020-11-11 DIAGNOSIS — F32A Depression, unspecified: Secondary | ICD-10-CM | POA: Diagnosis not present

## 2020-11-11 DIAGNOSIS — M47812 Spondylosis without myelopathy or radiculopathy, cervical region: Secondary | ICD-10-CM | POA: Diagnosis not present

## 2020-11-11 DIAGNOSIS — S82125D Nondisplaced fracture of lateral condyle of left tibia, subsequent encounter for closed fracture with routine healing: Secondary | ICD-10-CM | POA: Diagnosis not present

## 2020-11-11 DIAGNOSIS — I252 Old myocardial infarction: Secondary | ICD-10-CM | POA: Diagnosis not present

## 2020-11-11 DIAGNOSIS — I2510 Atherosclerotic heart disease of native coronary artery without angina pectoris: Secondary | ICD-10-CM | POA: Diagnosis not present

## 2020-11-11 DIAGNOSIS — I700 Atherosclerosis of aorta: Secondary | ICD-10-CM | POA: Diagnosis not present

## 2020-11-11 DIAGNOSIS — W19XXXD Unspecified fall, subsequent encounter: Secondary | ICD-10-CM | POA: Diagnosis not present

## 2020-11-11 DIAGNOSIS — N183 Chronic kidney disease, stage 3 unspecified: Secondary | ICD-10-CM | POA: Diagnosis not present

## 2020-11-15 DIAGNOSIS — F32A Depression, unspecified: Secondary | ICD-10-CM | POA: Diagnosis not present

## 2020-11-15 DIAGNOSIS — I252 Old myocardial infarction: Secondary | ICD-10-CM | POA: Diagnosis not present

## 2020-11-15 DIAGNOSIS — K219 Gastro-esophageal reflux disease without esophagitis: Secondary | ICD-10-CM | POA: Diagnosis not present

## 2020-11-15 DIAGNOSIS — M19041 Primary osteoarthritis, right hand: Secondary | ICD-10-CM | POA: Diagnosis not present

## 2020-11-15 DIAGNOSIS — E785 Hyperlipidemia, unspecified: Secondary | ICD-10-CM | POA: Diagnosis not present

## 2020-11-15 DIAGNOSIS — N3281 Overactive bladder: Secondary | ICD-10-CM | POA: Diagnosis not present

## 2020-11-15 DIAGNOSIS — H913 Deaf nonspeaking, not elsewhere classified: Secondary | ICD-10-CM | POA: Diagnosis not present

## 2020-11-15 DIAGNOSIS — I2510 Atherosclerotic heart disease of native coronary artery without angina pectoris: Secondary | ICD-10-CM | POA: Diagnosis not present

## 2020-11-15 DIAGNOSIS — S82125D Nondisplaced fracture of lateral condyle of left tibia, subsequent encounter for closed fracture with routine healing: Secondary | ICD-10-CM | POA: Diagnosis not present

## 2020-11-15 DIAGNOSIS — I700 Atherosclerosis of aorta: Secondary | ICD-10-CM | POA: Diagnosis not present

## 2020-11-15 DIAGNOSIS — E1122 Type 2 diabetes mellitus with diabetic chronic kidney disease: Secondary | ICD-10-CM | POA: Diagnosis not present

## 2020-11-15 DIAGNOSIS — K5900 Constipation, unspecified: Secondary | ICD-10-CM | POA: Diagnosis not present

## 2020-11-15 DIAGNOSIS — M19042 Primary osteoarthritis, left hand: Secondary | ICD-10-CM | POA: Diagnosis not present

## 2020-11-15 DIAGNOSIS — D631 Anemia in chronic kidney disease: Secondary | ICD-10-CM | POA: Diagnosis not present

## 2020-11-15 DIAGNOSIS — W19XXXD Unspecified fall, subsequent encounter: Secondary | ICD-10-CM | POA: Diagnosis not present

## 2020-11-15 DIAGNOSIS — M47812 Spondylosis without myelopathy or radiculopathy, cervical region: Secondary | ICD-10-CM | POA: Diagnosis not present

## 2020-11-15 DIAGNOSIS — I129 Hypertensive chronic kidney disease with stage 1 through stage 4 chronic kidney disease, or unspecified chronic kidney disease: Secondary | ICD-10-CM | POA: Diagnosis not present

## 2020-11-15 DIAGNOSIS — N183 Chronic kidney disease, stage 3 unspecified: Secondary | ICD-10-CM | POA: Diagnosis not present

## 2020-11-15 DIAGNOSIS — R339 Retention of urine, unspecified: Secondary | ICD-10-CM | POA: Diagnosis not present

## 2020-11-16 DIAGNOSIS — M19042 Primary osteoarthritis, left hand: Secondary | ICD-10-CM | POA: Diagnosis not present

## 2020-11-16 DIAGNOSIS — M19041 Primary osteoarthritis, right hand: Secondary | ICD-10-CM | POA: Diagnosis not present

## 2020-11-16 DIAGNOSIS — E1122 Type 2 diabetes mellitus with diabetic chronic kidney disease: Secondary | ICD-10-CM | POA: Diagnosis not present

## 2020-11-16 DIAGNOSIS — W19XXXD Unspecified fall, subsequent encounter: Secondary | ICD-10-CM | POA: Diagnosis not present

## 2020-11-16 DIAGNOSIS — K219 Gastro-esophageal reflux disease without esophagitis: Secondary | ICD-10-CM | POA: Diagnosis not present

## 2020-11-16 DIAGNOSIS — K5900 Constipation, unspecified: Secondary | ICD-10-CM | POA: Diagnosis not present

## 2020-11-16 DIAGNOSIS — I129 Hypertensive chronic kidney disease with stage 1 through stage 4 chronic kidney disease, or unspecified chronic kidney disease: Secondary | ICD-10-CM | POA: Diagnosis not present

## 2020-11-16 DIAGNOSIS — R339 Retention of urine, unspecified: Secondary | ICD-10-CM | POA: Diagnosis not present

## 2020-11-16 DIAGNOSIS — N183 Chronic kidney disease, stage 3 unspecified: Secondary | ICD-10-CM | POA: Diagnosis not present

## 2020-11-16 DIAGNOSIS — E785 Hyperlipidemia, unspecified: Secondary | ICD-10-CM | POA: Diagnosis not present

## 2020-11-16 DIAGNOSIS — H913 Deaf nonspeaking, not elsewhere classified: Secondary | ICD-10-CM | POA: Diagnosis not present

## 2020-11-16 DIAGNOSIS — S82125D Nondisplaced fracture of lateral condyle of left tibia, subsequent encounter for closed fracture with routine healing: Secondary | ICD-10-CM | POA: Diagnosis not present

## 2020-11-16 DIAGNOSIS — M47812 Spondylosis without myelopathy or radiculopathy, cervical region: Secondary | ICD-10-CM | POA: Diagnosis not present

## 2020-11-16 DIAGNOSIS — N3281 Overactive bladder: Secondary | ICD-10-CM | POA: Diagnosis not present

## 2020-11-16 DIAGNOSIS — F32A Depression, unspecified: Secondary | ICD-10-CM | POA: Diagnosis not present

## 2020-11-16 DIAGNOSIS — I2510 Atherosclerotic heart disease of native coronary artery without angina pectoris: Secondary | ICD-10-CM | POA: Diagnosis not present

## 2020-11-16 DIAGNOSIS — D631 Anemia in chronic kidney disease: Secondary | ICD-10-CM | POA: Diagnosis not present

## 2020-11-16 DIAGNOSIS — I700 Atherosclerosis of aorta: Secondary | ICD-10-CM | POA: Diagnosis not present

## 2020-11-16 DIAGNOSIS — I252 Old myocardial infarction: Secondary | ICD-10-CM | POA: Diagnosis not present

## 2020-11-17 DIAGNOSIS — M47812 Spondylosis without myelopathy or radiculopathy, cervical region: Secondary | ICD-10-CM | POA: Diagnosis not present

## 2020-11-17 DIAGNOSIS — I700 Atherosclerosis of aorta: Secondary | ICD-10-CM | POA: Diagnosis not present

## 2020-11-17 DIAGNOSIS — N3281 Overactive bladder: Secondary | ICD-10-CM | POA: Diagnosis not present

## 2020-11-17 DIAGNOSIS — R339 Retention of urine, unspecified: Secondary | ICD-10-CM | POA: Diagnosis not present

## 2020-11-17 DIAGNOSIS — M19041 Primary osteoarthritis, right hand: Secondary | ICD-10-CM | POA: Diagnosis not present

## 2020-11-17 DIAGNOSIS — W19XXXD Unspecified fall, subsequent encounter: Secondary | ICD-10-CM | POA: Diagnosis not present

## 2020-11-17 DIAGNOSIS — K219 Gastro-esophageal reflux disease without esophagitis: Secondary | ICD-10-CM | POA: Diagnosis not present

## 2020-11-17 DIAGNOSIS — E1122 Type 2 diabetes mellitus with diabetic chronic kidney disease: Secondary | ICD-10-CM | POA: Diagnosis not present

## 2020-11-17 DIAGNOSIS — M19042 Primary osteoarthritis, left hand: Secondary | ICD-10-CM | POA: Diagnosis not present

## 2020-11-17 DIAGNOSIS — F32A Depression, unspecified: Secondary | ICD-10-CM | POA: Diagnosis not present

## 2020-11-17 DIAGNOSIS — D631 Anemia in chronic kidney disease: Secondary | ICD-10-CM | POA: Diagnosis not present

## 2020-11-17 DIAGNOSIS — I252 Old myocardial infarction: Secondary | ICD-10-CM | POA: Diagnosis not present

## 2020-11-17 DIAGNOSIS — N183 Chronic kidney disease, stage 3 unspecified: Secondary | ICD-10-CM | POA: Diagnosis not present

## 2020-11-17 DIAGNOSIS — I2510 Atherosclerotic heart disease of native coronary artery without angina pectoris: Secondary | ICD-10-CM | POA: Diagnosis not present

## 2020-11-17 DIAGNOSIS — K5900 Constipation, unspecified: Secondary | ICD-10-CM | POA: Diagnosis not present

## 2020-11-17 DIAGNOSIS — I129 Hypertensive chronic kidney disease with stage 1 through stage 4 chronic kidney disease, or unspecified chronic kidney disease: Secondary | ICD-10-CM | POA: Diagnosis not present

## 2020-11-17 DIAGNOSIS — E785 Hyperlipidemia, unspecified: Secondary | ICD-10-CM | POA: Diagnosis not present

## 2020-11-17 DIAGNOSIS — S82125D Nondisplaced fracture of lateral condyle of left tibia, subsequent encounter for closed fracture with routine healing: Secondary | ICD-10-CM | POA: Diagnosis not present

## 2020-11-17 DIAGNOSIS — H913 Deaf nonspeaking, not elsewhere classified: Secondary | ICD-10-CM | POA: Diagnosis not present

## 2020-11-18 DIAGNOSIS — H913 Deaf nonspeaking, not elsewhere classified: Secondary | ICD-10-CM | POA: Diagnosis not present

## 2020-11-18 DIAGNOSIS — F32A Depression, unspecified: Secondary | ICD-10-CM | POA: Diagnosis not present

## 2020-11-18 DIAGNOSIS — I2510 Atherosclerotic heart disease of native coronary artery without angina pectoris: Secondary | ICD-10-CM | POA: Diagnosis not present

## 2020-11-18 DIAGNOSIS — M19042 Primary osteoarthritis, left hand: Secondary | ICD-10-CM | POA: Diagnosis not present

## 2020-11-18 DIAGNOSIS — N3281 Overactive bladder: Secondary | ICD-10-CM | POA: Diagnosis not present

## 2020-11-18 DIAGNOSIS — I129 Hypertensive chronic kidney disease with stage 1 through stage 4 chronic kidney disease, or unspecified chronic kidney disease: Secondary | ICD-10-CM | POA: Diagnosis not present

## 2020-11-18 DIAGNOSIS — I252 Old myocardial infarction: Secondary | ICD-10-CM | POA: Diagnosis not present

## 2020-11-18 DIAGNOSIS — N183 Chronic kidney disease, stage 3 unspecified: Secondary | ICD-10-CM | POA: Diagnosis not present

## 2020-11-18 DIAGNOSIS — M47812 Spondylosis without myelopathy or radiculopathy, cervical region: Secondary | ICD-10-CM | POA: Diagnosis not present

## 2020-11-18 DIAGNOSIS — K5900 Constipation, unspecified: Secondary | ICD-10-CM | POA: Diagnosis not present

## 2020-11-18 DIAGNOSIS — M19041 Primary osteoarthritis, right hand: Secondary | ICD-10-CM | POA: Diagnosis not present

## 2020-11-18 DIAGNOSIS — K219 Gastro-esophageal reflux disease without esophagitis: Secondary | ICD-10-CM | POA: Diagnosis not present

## 2020-11-18 DIAGNOSIS — I700 Atherosclerosis of aorta: Secondary | ICD-10-CM | POA: Diagnosis not present

## 2020-11-18 DIAGNOSIS — D631 Anemia in chronic kidney disease: Secondary | ICD-10-CM | POA: Diagnosis not present

## 2020-11-18 DIAGNOSIS — S82125D Nondisplaced fracture of lateral condyle of left tibia, subsequent encounter for closed fracture with routine healing: Secondary | ICD-10-CM | POA: Diagnosis not present

## 2020-11-18 DIAGNOSIS — E1122 Type 2 diabetes mellitus with diabetic chronic kidney disease: Secondary | ICD-10-CM | POA: Diagnosis not present

## 2020-11-18 DIAGNOSIS — R339 Retention of urine, unspecified: Secondary | ICD-10-CM | POA: Diagnosis not present

## 2020-11-18 DIAGNOSIS — E785 Hyperlipidemia, unspecified: Secondary | ICD-10-CM | POA: Diagnosis not present

## 2020-11-18 DIAGNOSIS — W19XXXD Unspecified fall, subsequent encounter: Secondary | ICD-10-CM | POA: Diagnosis not present

## 2020-11-23 DIAGNOSIS — M19041 Primary osteoarthritis, right hand: Secondary | ICD-10-CM | POA: Diagnosis not present

## 2020-11-23 DIAGNOSIS — M19042 Primary osteoarthritis, left hand: Secondary | ICD-10-CM | POA: Diagnosis not present

## 2020-11-23 DIAGNOSIS — K5900 Constipation, unspecified: Secondary | ICD-10-CM | POA: Diagnosis not present

## 2020-11-23 DIAGNOSIS — I129 Hypertensive chronic kidney disease with stage 1 through stage 4 chronic kidney disease, or unspecified chronic kidney disease: Secondary | ICD-10-CM | POA: Diagnosis not present

## 2020-11-23 DIAGNOSIS — N183 Chronic kidney disease, stage 3 unspecified: Secondary | ICD-10-CM | POA: Diagnosis not present

## 2020-11-23 DIAGNOSIS — N3281 Overactive bladder: Secondary | ICD-10-CM | POA: Diagnosis not present

## 2020-11-23 DIAGNOSIS — E785 Hyperlipidemia, unspecified: Secondary | ICD-10-CM | POA: Diagnosis not present

## 2020-11-23 DIAGNOSIS — F32A Depression, unspecified: Secondary | ICD-10-CM | POA: Diagnosis not present

## 2020-11-23 DIAGNOSIS — H913 Deaf nonspeaking, not elsewhere classified: Secondary | ICD-10-CM | POA: Diagnosis not present

## 2020-11-23 DIAGNOSIS — I2510 Atherosclerotic heart disease of native coronary artery without angina pectoris: Secondary | ICD-10-CM | POA: Diagnosis not present

## 2020-11-23 DIAGNOSIS — S82125D Nondisplaced fracture of lateral condyle of left tibia, subsequent encounter for closed fracture with routine healing: Secondary | ICD-10-CM | POA: Diagnosis not present

## 2020-11-23 DIAGNOSIS — I252 Old myocardial infarction: Secondary | ICD-10-CM | POA: Diagnosis not present

## 2020-11-23 DIAGNOSIS — I700 Atherosclerosis of aorta: Secondary | ICD-10-CM | POA: Diagnosis not present

## 2020-11-23 DIAGNOSIS — W19XXXD Unspecified fall, subsequent encounter: Secondary | ICD-10-CM | POA: Diagnosis not present

## 2020-11-23 DIAGNOSIS — E1122 Type 2 diabetes mellitus with diabetic chronic kidney disease: Secondary | ICD-10-CM | POA: Diagnosis not present

## 2020-11-23 DIAGNOSIS — R339 Retention of urine, unspecified: Secondary | ICD-10-CM | POA: Diagnosis not present

## 2020-11-23 DIAGNOSIS — D631 Anemia in chronic kidney disease: Secondary | ICD-10-CM | POA: Diagnosis not present

## 2020-11-23 DIAGNOSIS — K219 Gastro-esophageal reflux disease without esophagitis: Secondary | ICD-10-CM | POA: Diagnosis not present

## 2020-11-23 DIAGNOSIS — M47812 Spondylosis without myelopathy or radiculopathy, cervical region: Secondary | ICD-10-CM | POA: Diagnosis not present

## 2020-11-24 DIAGNOSIS — E785 Hyperlipidemia, unspecified: Secondary | ICD-10-CM | POA: Diagnosis not present

## 2020-11-24 DIAGNOSIS — M19042 Primary osteoarthritis, left hand: Secondary | ICD-10-CM | POA: Diagnosis not present

## 2020-11-24 DIAGNOSIS — F32A Depression, unspecified: Secondary | ICD-10-CM | POA: Diagnosis not present

## 2020-11-24 DIAGNOSIS — I700 Atherosclerosis of aorta: Secondary | ICD-10-CM | POA: Diagnosis not present

## 2020-11-24 DIAGNOSIS — N3281 Overactive bladder: Secondary | ICD-10-CM | POA: Diagnosis not present

## 2020-11-24 DIAGNOSIS — D631 Anemia in chronic kidney disease: Secondary | ICD-10-CM | POA: Diagnosis not present

## 2020-11-24 DIAGNOSIS — M47812 Spondylosis without myelopathy or radiculopathy, cervical region: Secondary | ICD-10-CM | POA: Diagnosis not present

## 2020-11-24 DIAGNOSIS — K219 Gastro-esophageal reflux disease without esophagitis: Secondary | ICD-10-CM | POA: Diagnosis not present

## 2020-11-24 DIAGNOSIS — I2510 Atherosclerotic heart disease of native coronary artery without angina pectoris: Secondary | ICD-10-CM | POA: Diagnosis not present

## 2020-11-24 DIAGNOSIS — S82125D Nondisplaced fracture of lateral condyle of left tibia, subsequent encounter for closed fracture with routine healing: Secondary | ICD-10-CM | POA: Diagnosis not present

## 2020-11-24 DIAGNOSIS — H913 Deaf nonspeaking, not elsewhere classified: Secondary | ICD-10-CM | POA: Diagnosis not present

## 2020-11-24 DIAGNOSIS — I129 Hypertensive chronic kidney disease with stage 1 through stage 4 chronic kidney disease, or unspecified chronic kidney disease: Secondary | ICD-10-CM | POA: Diagnosis not present

## 2020-11-24 DIAGNOSIS — M19041 Primary osteoarthritis, right hand: Secondary | ICD-10-CM | POA: Diagnosis not present

## 2020-11-24 DIAGNOSIS — E1122 Type 2 diabetes mellitus with diabetic chronic kidney disease: Secondary | ICD-10-CM | POA: Diagnosis not present

## 2020-11-24 DIAGNOSIS — R339 Retention of urine, unspecified: Secondary | ICD-10-CM | POA: Diagnosis not present

## 2020-11-24 DIAGNOSIS — N183 Chronic kidney disease, stage 3 unspecified: Secondary | ICD-10-CM | POA: Diagnosis not present

## 2020-11-24 DIAGNOSIS — W19XXXD Unspecified fall, subsequent encounter: Secondary | ICD-10-CM | POA: Diagnosis not present

## 2020-11-24 DIAGNOSIS — I252 Old myocardial infarction: Secondary | ICD-10-CM | POA: Diagnosis not present

## 2020-11-24 DIAGNOSIS — K5900 Constipation, unspecified: Secondary | ICD-10-CM | POA: Diagnosis not present

## 2020-11-25 DIAGNOSIS — M19041 Primary osteoarthritis, right hand: Secondary | ICD-10-CM | POA: Diagnosis not present

## 2020-11-25 DIAGNOSIS — E1122 Type 2 diabetes mellitus with diabetic chronic kidney disease: Secondary | ICD-10-CM | POA: Diagnosis not present

## 2020-11-25 DIAGNOSIS — N3281 Overactive bladder: Secondary | ICD-10-CM | POA: Diagnosis not present

## 2020-11-25 DIAGNOSIS — W19XXXD Unspecified fall, subsequent encounter: Secondary | ICD-10-CM | POA: Diagnosis not present

## 2020-11-25 DIAGNOSIS — N183 Chronic kidney disease, stage 3 unspecified: Secondary | ICD-10-CM | POA: Diagnosis not present

## 2020-11-25 DIAGNOSIS — R339 Retention of urine, unspecified: Secondary | ICD-10-CM | POA: Diagnosis not present

## 2020-11-25 DIAGNOSIS — I129 Hypertensive chronic kidney disease with stage 1 through stage 4 chronic kidney disease, or unspecified chronic kidney disease: Secondary | ICD-10-CM | POA: Diagnosis not present

## 2020-11-25 DIAGNOSIS — D631 Anemia in chronic kidney disease: Secondary | ICD-10-CM | POA: Diagnosis not present

## 2020-11-25 DIAGNOSIS — K219 Gastro-esophageal reflux disease without esophagitis: Secondary | ICD-10-CM | POA: Diagnosis not present

## 2020-11-25 DIAGNOSIS — E785 Hyperlipidemia, unspecified: Secondary | ICD-10-CM | POA: Diagnosis not present

## 2020-11-25 DIAGNOSIS — F32A Depression, unspecified: Secondary | ICD-10-CM | POA: Diagnosis not present

## 2020-11-25 DIAGNOSIS — I252 Old myocardial infarction: Secondary | ICD-10-CM | POA: Diagnosis not present

## 2020-11-25 DIAGNOSIS — H913 Deaf nonspeaking, not elsewhere classified: Secondary | ICD-10-CM | POA: Diagnosis not present

## 2020-11-25 DIAGNOSIS — S82125D Nondisplaced fracture of lateral condyle of left tibia, subsequent encounter for closed fracture with routine healing: Secondary | ICD-10-CM | POA: Diagnosis not present

## 2020-11-25 DIAGNOSIS — I700 Atherosclerosis of aorta: Secondary | ICD-10-CM | POA: Diagnosis not present

## 2020-11-25 DIAGNOSIS — M19042 Primary osteoarthritis, left hand: Secondary | ICD-10-CM | POA: Diagnosis not present

## 2020-11-25 DIAGNOSIS — M47812 Spondylosis without myelopathy or radiculopathy, cervical region: Secondary | ICD-10-CM | POA: Diagnosis not present

## 2020-11-25 DIAGNOSIS — I2510 Atherosclerotic heart disease of native coronary artery without angina pectoris: Secondary | ICD-10-CM | POA: Diagnosis not present

## 2020-11-25 DIAGNOSIS — K5900 Constipation, unspecified: Secondary | ICD-10-CM | POA: Diagnosis not present

## 2020-11-29 DIAGNOSIS — H913 Deaf nonspeaking, not elsewhere classified: Secondary | ICD-10-CM | POA: Diagnosis not present

## 2020-11-29 DIAGNOSIS — E1122 Type 2 diabetes mellitus with diabetic chronic kidney disease: Secondary | ICD-10-CM | POA: Diagnosis not present

## 2020-11-29 DIAGNOSIS — I252 Old myocardial infarction: Secondary | ICD-10-CM | POA: Diagnosis not present

## 2020-11-29 DIAGNOSIS — M19041 Primary osteoarthritis, right hand: Secondary | ICD-10-CM | POA: Diagnosis not present

## 2020-11-29 DIAGNOSIS — S82125D Nondisplaced fracture of lateral condyle of left tibia, subsequent encounter for closed fracture with routine healing: Secondary | ICD-10-CM | POA: Diagnosis not present

## 2020-11-29 DIAGNOSIS — E785 Hyperlipidemia, unspecified: Secondary | ICD-10-CM | POA: Diagnosis not present

## 2020-11-29 DIAGNOSIS — K5900 Constipation, unspecified: Secondary | ICD-10-CM | POA: Diagnosis not present

## 2020-11-29 DIAGNOSIS — I129 Hypertensive chronic kidney disease with stage 1 through stage 4 chronic kidney disease, or unspecified chronic kidney disease: Secondary | ICD-10-CM | POA: Diagnosis not present

## 2020-11-29 DIAGNOSIS — K219 Gastro-esophageal reflux disease without esophagitis: Secondary | ICD-10-CM | POA: Diagnosis not present

## 2020-11-29 DIAGNOSIS — I2510 Atherosclerotic heart disease of native coronary artery without angina pectoris: Secondary | ICD-10-CM | POA: Diagnosis not present

## 2020-11-29 DIAGNOSIS — W19XXXD Unspecified fall, subsequent encounter: Secondary | ICD-10-CM | POA: Diagnosis not present

## 2020-11-29 DIAGNOSIS — I700 Atherosclerosis of aorta: Secondary | ICD-10-CM | POA: Diagnosis not present

## 2020-11-29 DIAGNOSIS — F32A Depression, unspecified: Secondary | ICD-10-CM | POA: Diagnosis not present

## 2020-11-29 DIAGNOSIS — D631 Anemia in chronic kidney disease: Secondary | ICD-10-CM | POA: Diagnosis not present

## 2020-11-29 DIAGNOSIS — M47812 Spondylosis without myelopathy or radiculopathy, cervical region: Secondary | ICD-10-CM | POA: Diagnosis not present

## 2020-11-29 DIAGNOSIS — N183 Chronic kidney disease, stage 3 unspecified: Secondary | ICD-10-CM | POA: Diagnosis not present

## 2020-11-29 DIAGNOSIS — M19042 Primary osteoarthritis, left hand: Secondary | ICD-10-CM | POA: Diagnosis not present

## 2020-11-29 DIAGNOSIS — N3281 Overactive bladder: Secondary | ICD-10-CM | POA: Diagnosis not present

## 2020-11-29 DIAGNOSIS — R339 Retention of urine, unspecified: Secondary | ICD-10-CM | POA: Diagnosis not present

## 2020-11-30 DIAGNOSIS — N183 Chronic kidney disease, stage 3 unspecified: Secondary | ICD-10-CM | POA: Diagnosis not present

## 2020-11-30 DIAGNOSIS — E1122 Type 2 diabetes mellitus with diabetic chronic kidney disease: Secondary | ICD-10-CM | POA: Diagnosis not present

## 2020-11-30 DIAGNOSIS — K5900 Constipation, unspecified: Secondary | ICD-10-CM | POA: Diagnosis not present

## 2020-11-30 DIAGNOSIS — K219 Gastro-esophageal reflux disease without esophagitis: Secondary | ICD-10-CM | POA: Diagnosis not present

## 2020-11-30 DIAGNOSIS — H913 Deaf nonspeaking, not elsewhere classified: Secondary | ICD-10-CM | POA: Diagnosis not present

## 2020-11-30 DIAGNOSIS — E785 Hyperlipidemia, unspecified: Secondary | ICD-10-CM | POA: Diagnosis not present

## 2020-11-30 DIAGNOSIS — M47812 Spondylosis without myelopathy or radiculopathy, cervical region: Secondary | ICD-10-CM | POA: Diagnosis not present

## 2020-11-30 DIAGNOSIS — I129 Hypertensive chronic kidney disease with stage 1 through stage 4 chronic kidney disease, or unspecified chronic kidney disease: Secondary | ICD-10-CM | POA: Diagnosis not present

## 2020-11-30 DIAGNOSIS — N3281 Overactive bladder: Secondary | ICD-10-CM | POA: Diagnosis not present

## 2020-11-30 DIAGNOSIS — W19XXXD Unspecified fall, subsequent encounter: Secondary | ICD-10-CM | POA: Diagnosis not present

## 2020-11-30 DIAGNOSIS — R339 Retention of urine, unspecified: Secondary | ICD-10-CM | POA: Diagnosis not present

## 2020-11-30 DIAGNOSIS — I2510 Atherosclerotic heart disease of native coronary artery without angina pectoris: Secondary | ICD-10-CM | POA: Diagnosis not present

## 2020-11-30 DIAGNOSIS — M19042 Primary osteoarthritis, left hand: Secondary | ICD-10-CM | POA: Diagnosis not present

## 2020-11-30 DIAGNOSIS — D631 Anemia in chronic kidney disease: Secondary | ICD-10-CM | POA: Diagnosis not present

## 2020-11-30 DIAGNOSIS — F32A Depression, unspecified: Secondary | ICD-10-CM | POA: Diagnosis not present

## 2020-11-30 DIAGNOSIS — I252 Old myocardial infarction: Secondary | ICD-10-CM | POA: Diagnosis not present

## 2020-11-30 DIAGNOSIS — I700 Atherosclerosis of aorta: Secondary | ICD-10-CM | POA: Diagnosis not present

## 2020-11-30 DIAGNOSIS — S82125D Nondisplaced fracture of lateral condyle of left tibia, subsequent encounter for closed fracture with routine healing: Secondary | ICD-10-CM | POA: Diagnosis not present

## 2020-11-30 DIAGNOSIS — M19041 Primary osteoarthritis, right hand: Secondary | ICD-10-CM | POA: Diagnosis not present

## 2020-12-02 DIAGNOSIS — K219 Gastro-esophageal reflux disease without esophagitis: Secondary | ICD-10-CM | POA: Diagnosis not present

## 2020-12-02 DIAGNOSIS — E1122 Type 2 diabetes mellitus with diabetic chronic kidney disease: Secondary | ICD-10-CM | POA: Diagnosis not present

## 2020-12-02 DIAGNOSIS — I129 Hypertensive chronic kidney disease with stage 1 through stage 4 chronic kidney disease, or unspecified chronic kidney disease: Secondary | ICD-10-CM | POA: Diagnosis not present

## 2020-12-02 DIAGNOSIS — N3281 Overactive bladder: Secondary | ICD-10-CM | POA: Diagnosis not present

## 2020-12-02 DIAGNOSIS — D631 Anemia in chronic kidney disease: Secondary | ICD-10-CM | POA: Diagnosis not present

## 2020-12-02 DIAGNOSIS — I700 Atherosclerosis of aorta: Secondary | ICD-10-CM | POA: Diagnosis not present

## 2020-12-02 DIAGNOSIS — H913 Deaf nonspeaking, not elsewhere classified: Secondary | ICD-10-CM | POA: Diagnosis not present

## 2020-12-02 DIAGNOSIS — F32A Depression, unspecified: Secondary | ICD-10-CM | POA: Diagnosis not present

## 2020-12-02 DIAGNOSIS — M47812 Spondylosis without myelopathy or radiculopathy, cervical region: Secondary | ICD-10-CM | POA: Diagnosis not present

## 2020-12-02 DIAGNOSIS — I252 Old myocardial infarction: Secondary | ICD-10-CM | POA: Diagnosis not present

## 2020-12-02 DIAGNOSIS — K5900 Constipation, unspecified: Secondary | ICD-10-CM | POA: Diagnosis not present

## 2020-12-02 DIAGNOSIS — S82125D Nondisplaced fracture of lateral condyle of left tibia, subsequent encounter for closed fracture with routine healing: Secondary | ICD-10-CM | POA: Diagnosis not present

## 2020-12-02 DIAGNOSIS — M19042 Primary osteoarthritis, left hand: Secondary | ICD-10-CM | POA: Diagnosis not present

## 2020-12-02 DIAGNOSIS — E785 Hyperlipidemia, unspecified: Secondary | ICD-10-CM | POA: Diagnosis not present

## 2020-12-02 DIAGNOSIS — M19041 Primary osteoarthritis, right hand: Secondary | ICD-10-CM | POA: Diagnosis not present

## 2020-12-02 DIAGNOSIS — N183 Chronic kidney disease, stage 3 unspecified: Secondary | ICD-10-CM | POA: Diagnosis not present

## 2020-12-02 DIAGNOSIS — W19XXXD Unspecified fall, subsequent encounter: Secondary | ICD-10-CM | POA: Diagnosis not present

## 2020-12-02 DIAGNOSIS — R339 Retention of urine, unspecified: Secondary | ICD-10-CM | POA: Diagnosis not present

## 2020-12-02 DIAGNOSIS — I2510 Atherosclerotic heart disease of native coronary artery without angina pectoris: Secondary | ICD-10-CM | POA: Diagnosis not present

## 2020-12-07 DIAGNOSIS — M19042 Primary osteoarthritis, left hand: Secondary | ICD-10-CM | POA: Diagnosis not present

## 2020-12-07 DIAGNOSIS — M19041 Primary osteoarthritis, right hand: Secondary | ICD-10-CM | POA: Diagnosis not present

## 2020-12-07 DIAGNOSIS — N183 Chronic kidney disease, stage 3 unspecified: Secondary | ICD-10-CM | POA: Diagnosis not present

## 2020-12-07 DIAGNOSIS — K219 Gastro-esophageal reflux disease without esophagitis: Secondary | ICD-10-CM | POA: Diagnosis not present

## 2020-12-07 DIAGNOSIS — E785 Hyperlipidemia, unspecified: Secondary | ICD-10-CM | POA: Diagnosis not present

## 2020-12-07 DIAGNOSIS — I129 Hypertensive chronic kidney disease with stage 1 through stage 4 chronic kidney disease, or unspecified chronic kidney disease: Secondary | ICD-10-CM | POA: Diagnosis not present

## 2020-12-07 DIAGNOSIS — I252 Old myocardial infarction: Secondary | ICD-10-CM | POA: Diagnosis not present

## 2020-12-07 DIAGNOSIS — E1122 Type 2 diabetes mellitus with diabetic chronic kidney disease: Secondary | ICD-10-CM | POA: Diagnosis not present

## 2020-12-07 DIAGNOSIS — S82125D Nondisplaced fracture of lateral condyle of left tibia, subsequent encounter for closed fracture with routine healing: Secondary | ICD-10-CM | POA: Diagnosis not present

## 2020-12-07 DIAGNOSIS — K5900 Constipation, unspecified: Secondary | ICD-10-CM | POA: Diagnosis not present

## 2020-12-07 DIAGNOSIS — R339 Retention of urine, unspecified: Secondary | ICD-10-CM | POA: Diagnosis not present

## 2020-12-07 DIAGNOSIS — I700 Atherosclerosis of aorta: Secondary | ICD-10-CM | POA: Diagnosis not present

## 2020-12-07 DIAGNOSIS — I2510 Atherosclerotic heart disease of native coronary artery without angina pectoris: Secondary | ICD-10-CM | POA: Diagnosis not present

## 2020-12-07 DIAGNOSIS — D631 Anemia in chronic kidney disease: Secondary | ICD-10-CM | POA: Diagnosis not present

## 2020-12-07 DIAGNOSIS — N3281 Overactive bladder: Secondary | ICD-10-CM | POA: Diagnosis not present

## 2020-12-07 DIAGNOSIS — F32A Depression, unspecified: Secondary | ICD-10-CM | POA: Diagnosis not present

## 2020-12-07 DIAGNOSIS — W19XXXD Unspecified fall, subsequent encounter: Secondary | ICD-10-CM | POA: Diagnosis not present

## 2020-12-07 DIAGNOSIS — M47812 Spondylosis without myelopathy or radiculopathy, cervical region: Secondary | ICD-10-CM | POA: Diagnosis not present

## 2020-12-07 DIAGNOSIS — H913 Deaf nonspeaking, not elsewhere classified: Secondary | ICD-10-CM | POA: Diagnosis not present

## 2020-12-08 DIAGNOSIS — N3281 Overactive bladder: Secondary | ICD-10-CM | POA: Diagnosis not present

## 2020-12-08 DIAGNOSIS — I129 Hypertensive chronic kidney disease with stage 1 through stage 4 chronic kidney disease, or unspecified chronic kidney disease: Secondary | ICD-10-CM | POA: Diagnosis not present

## 2020-12-08 DIAGNOSIS — M47812 Spondylosis without myelopathy or radiculopathy, cervical region: Secondary | ICD-10-CM | POA: Diagnosis not present

## 2020-12-08 DIAGNOSIS — I252 Old myocardial infarction: Secondary | ICD-10-CM | POA: Diagnosis not present

## 2020-12-08 DIAGNOSIS — M19042 Primary osteoarthritis, left hand: Secondary | ICD-10-CM | POA: Diagnosis not present

## 2020-12-08 DIAGNOSIS — S82125D Nondisplaced fracture of lateral condyle of left tibia, subsequent encounter for closed fracture with routine healing: Secondary | ICD-10-CM | POA: Diagnosis not present

## 2020-12-08 DIAGNOSIS — N183 Chronic kidney disease, stage 3 unspecified: Secondary | ICD-10-CM | POA: Diagnosis not present

## 2020-12-08 DIAGNOSIS — W19XXXD Unspecified fall, subsequent encounter: Secondary | ICD-10-CM | POA: Diagnosis not present

## 2020-12-08 DIAGNOSIS — F32A Depression, unspecified: Secondary | ICD-10-CM | POA: Diagnosis not present

## 2020-12-08 DIAGNOSIS — K5900 Constipation, unspecified: Secondary | ICD-10-CM | POA: Diagnosis not present

## 2020-12-08 DIAGNOSIS — E1122 Type 2 diabetes mellitus with diabetic chronic kidney disease: Secondary | ICD-10-CM | POA: Diagnosis not present

## 2020-12-08 DIAGNOSIS — K219 Gastro-esophageal reflux disease without esophagitis: Secondary | ICD-10-CM | POA: Diagnosis not present

## 2020-12-08 DIAGNOSIS — H913 Deaf nonspeaking, not elsewhere classified: Secondary | ICD-10-CM | POA: Diagnosis not present

## 2020-12-08 DIAGNOSIS — R339 Retention of urine, unspecified: Secondary | ICD-10-CM | POA: Diagnosis not present

## 2020-12-08 DIAGNOSIS — E785 Hyperlipidemia, unspecified: Secondary | ICD-10-CM | POA: Diagnosis not present

## 2020-12-08 DIAGNOSIS — D631 Anemia in chronic kidney disease: Secondary | ICD-10-CM | POA: Diagnosis not present

## 2020-12-08 DIAGNOSIS — I2510 Atherosclerotic heart disease of native coronary artery without angina pectoris: Secondary | ICD-10-CM | POA: Diagnosis not present

## 2020-12-08 DIAGNOSIS — I700 Atherosclerosis of aorta: Secondary | ICD-10-CM | POA: Diagnosis not present

## 2020-12-08 DIAGNOSIS — M19041 Primary osteoarthritis, right hand: Secondary | ICD-10-CM | POA: Diagnosis not present

## 2020-12-09 DIAGNOSIS — N3281 Overactive bladder: Secondary | ICD-10-CM | POA: Diagnosis not present

## 2020-12-09 DIAGNOSIS — H913 Deaf nonspeaking, not elsewhere classified: Secondary | ICD-10-CM | POA: Diagnosis not present

## 2020-12-09 DIAGNOSIS — E785 Hyperlipidemia, unspecified: Secondary | ICD-10-CM | POA: Diagnosis not present

## 2020-12-09 DIAGNOSIS — M19042 Primary osteoarthritis, left hand: Secondary | ICD-10-CM | POA: Diagnosis not present

## 2020-12-09 DIAGNOSIS — D631 Anemia in chronic kidney disease: Secondary | ICD-10-CM | POA: Diagnosis not present

## 2020-12-09 DIAGNOSIS — N183 Chronic kidney disease, stage 3 unspecified: Secondary | ICD-10-CM | POA: Diagnosis not present

## 2020-12-09 DIAGNOSIS — W19XXXD Unspecified fall, subsequent encounter: Secondary | ICD-10-CM | POA: Diagnosis not present

## 2020-12-09 DIAGNOSIS — F32A Depression, unspecified: Secondary | ICD-10-CM | POA: Diagnosis not present

## 2020-12-09 DIAGNOSIS — K5900 Constipation, unspecified: Secondary | ICD-10-CM | POA: Diagnosis not present

## 2020-12-09 DIAGNOSIS — I700 Atherosclerosis of aorta: Secondary | ICD-10-CM | POA: Diagnosis not present

## 2020-12-09 DIAGNOSIS — R339 Retention of urine, unspecified: Secondary | ICD-10-CM | POA: Diagnosis not present

## 2020-12-09 DIAGNOSIS — I252 Old myocardial infarction: Secondary | ICD-10-CM | POA: Diagnosis not present

## 2020-12-09 DIAGNOSIS — I129 Hypertensive chronic kidney disease with stage 1 through stage 4 chronic kidney disease, or unspecified chronic kidney disease: Secondary | ICD-10-CM | POA: Diagnosis not present

## 2020-12-09 DIAGNOSIS — K219 Gastro-esophageal reflux disease without esophagitis: Secondary | ICD-10-CM | POA: Diagnosis not present

## 2020-12-09 DIAGNOSIS — S82125D Nondisplaced fracture of lateral condyle of left tibia, subsequent encounter for closed fracture with routine healing: Secondary | ICD-10-CM | POA: Diagnosis not present

## 2020-12-09 DIAGNOSIS — M47812 Spondylosis without myelopathy or radiculopathy, cervical region: Secondary | ICD-10-CM | POA: Diagnosis not present

## 2020-12-09 DIAGNOSIS — M19041 Primary osteoarthritis, right hand: Secondary | ICD-10-CM | POA: Diagnosis not present

## 2020-12-09 DIAGNOSIS — I2510 Atherosclerotic heart disease of native coronary artery without angina pectoris: Secondary | ICD-10-CM | POA: Diagnosis not present

## 2020-12-09 DIAGNOSIS — E1122 Type 2 diabetes mellitus with diabetic chronic kidney disease: Secondary | ICD-10-CM | POA: Diagnosis not present

## 2020-12-13 DIAGNOSIS — M19042 Primary osteoarthritis, left hand: Secondary | ICD-10-CM | POA: Diagnosis not present

## 2020-12-13 DIAGNOSIS — R339 Retention of urine, unspecified: Secondary | ICD-10-CM | POA: Diagnosis not present

## 2020-12-13 DIAGNOSIS — E11621 Type 2 diabetes mellitus with foot ulcer: Secondary | ICD-10-CM | POA: Diagnosis not present

## 2020-12-13 DIAGNOSIS — E785 Hyperlipidemia, unspecified: Secondary | ICD-10-CM | POA: Diagnosis not present

## 2020-12-13 DIAGNOSIS — K5900 Constipation, unspecified: Secondary | ICD-10-CM | POA: Diagnosis not present

## 2020-12-13 DIAGNOSIS — H913 Deaf nonspeaking, not elsewhere classified: Secondary | ICD-10-CM | POA: Diagnosis not present

## 2020-12-13 DIAGNOSIS — L97521 Non-pressure chronic ulcer of other part of left foot limited to breakdown of skin: Secondary | ICD-10-CM | POA: Diagnosis not present

## 2020-12-13 DIAGNOSIS — N183 Chronic kidney disease, stage 3 unspecified: Secondary | ICD-10-CM | POA: Diagnosis not present

## 2020-12-13 DIAGNOSIS — I2510 Atherosclerotic heart disease of native coronary artery without angina pectoris: Secondary | ICD-10-CM | POA: Diagnosis not present

## 2020-12-13 DIAGNOSIS — I252 Old myocardial infarction: Secondary | ICD-10-CM | POA: Diagnosis not present

## 2020-12-13 DIAGNOSIS — D631 Anemia in chronic kidney disease: Secondary | ICD-10-CM | POA: Diagnosis not present

## 2020-12-13 DIAGNOSIS — I129 Hypertensive chronic kidney disease with stage 1 through stage 4 chronic kidney disease, or unspecified chronic kidney disease: Secondary | ICD-10-CM | POA: Diagnosis not present

## 2020-12-13 DIAGNOSIS — M19041 Primary osteoarthritis, right hand: Secondary | ICD-10-CM | POA: Diagnosis not present

## 2020-12-13 DIAGNOSIS — E1140 Type 2 diabetes mellitus with diabetic neuropathy, unspecified: Secondary | ICD-10-CM | POA: Diagnosis not present

## 2020-12-13 DIAGNOSIS — K219 Gastro-esophageal reflux disease without esophagitis: Secondary | ICD-10-CM | POA: Diagnosis not present

## 2020-12-13 DIAGNOSIS — M47812 Spondylosis without myelopathy or radiculopathy, cervical region: Secondary | ICD-10-CM | POA: Diagnosis not present

## 2020-12-13 DIAGNOSIS — E1122 Type 2 diabetes mellitus with diabetic chronic kidney disease: Secondary | ICD-10-CM | POA: Diagnosis not present

## 2020-12-13 DIAGNOSIS — I700 Atherosclerosis of aorta: Secondary | ICD-10-CM | POA: Diagnosis not present

## 2020-12-13 DIAGNOSIS — W19XXXD Unspecified fall, subsequent encounter: Secondary | ICD-10-CM | POA: Diagnosis not present

## 2020-12-13 DIAGNOSIS — S82125D Nondisplaced fracture of lateral condyle of left tibia, subsequent encounter for closed fracture with routine healing: Secondary | ICD-10-CM | POA: Diagnosis not present

## 2020-12-13 DIAGNOSIS — N3281 Overactive bladder: Secondary | ICD-10-CM | POA: Diagnosis not present

## 2020-12-13 DIAGNOSIS — F32A Depression, unspecified: Secondary | ICD-10-CM | POA: Diagnosis not present

## 2020-12-14 DIAGNOSIS — L97521 Non-pressure chronic ulcer of other part of left foot limited to breakdown of skin: Secondary | ICD-10-CM | POA: Diagnosis not present

## 2020-12-14 DIAGNOSIS — F32A Depression, unspecified: Secondary | ICD-10-CM | POA: Diagnosis not present

## 2020-12-14 DIAGNOSIS — E785 Hyperlipidemia, unspecified: Secondary | ICD-10-CM | POA: Diagnosis not present

## 2020-12-14 DIAGNOSIS — R339 Retention of urine, unspecified: Secondary | ICD-10-CM | POA: Diagnosis not present

## 2020-12-14 DIAGNOSIS — W19XXXD Unspecified fall, subsequent encounter: Secondary | ICD-10-CM | POA: Diagnosis not present

## 2020-12-14 DIAGNOSIS — E1140 Type 2 diabetes mellitus with diabetic neuropathy, unspecified: Secondary | ICD-10-CM | POA: Diagnosis not present

## 2020-12-14 DIAGNOSIS — I129 Hypertensive chronic kidney disease with stage 1 through stage 4 chronic kidney disease, or unspecified chronic kidney disease: Secondary | ICD-10-CM | POA: Diagnosis not present

## 2020-12-14 DIAGNOSIS — K219 Gastro-esophageal reflux disease without esophagitis: Secondary | ICD-10-CM | POA: Diagnosis not present

## 2020-12-14 DIAGNOSIS — M19041 Primary osteoarthritis, right hand: Secondary | ICD-10-CM | POA: Diagnosis not present

## 2020-12-14 DIAGNOSIS — N3281 Overactive bladder: Secondary | ICD-10-CM | POA: Diagnosis not present

## 2020-12-14 DIAGNOSIS — E11621 Type 2 diabetes mellitus with foot ulcer: Secondary | ICD-10-CM | POA: Diagnosis not present

## 2020-12-14 DIAGNOSIS — E1122 Type 2 diabetes mellitus with diabetic chronic kidney disease: Secondary | ICD-10-CM | POA: Diagnosis not present

## 2020-12-14 DIAGNOSIS — I700 Atherosclerosis of aorta: Secondary | ICD-10-CM | POA: Diagnosis not present

## 2020-12-14 DIAGNOSIS — M47812 Spondylosis without myelopathy or radiculopathy, cervical region: Secondary | ICD-10-CM | POA: Diagnosis not present

## 2020-12-14 DIAGNOSIS — K5900 Constipation, unspecified: Secondary | ICD-10-CM | POA: Diagnosis not present

## 2020-12-14 DIAGNOSIS — M19042 Primary osteoarthritis, left hand: Secondary | ICD-10-CM | POA: Diagnosis not present

## 2020-12-14 DIAGNOSIS — N183 Chronic kidney disease, stage 3 unspecified: Secondary | ICD-10-CM | POA: Diagnosis not present

## 2020-12-14 DIAGNOSIS — D631 Anemia in chronic kidney disease: Secondary | ICD-10-CM | POA: Diagnosis not present

## 2020-12-14 DIAGNOSIS — I252 Old myocardial infarction: Secondary | ICD-10-CM | POA: Diagnosis not present

## 2020-12-14 DIAGNOSIS — I2510 Atherosclerotic heart disease of native coronary artery without angina pectoris: Secondary | ICD-10-CM | POA: Diagnosis not present

## 2020-12-14 DIAGNOSIS — H913 Deaf nonspeaking, not elsewhere classified: Secondary | ICD-10-CM | POA: Diagnosis not present

## 2020-12-14 DIAGNOSIS — S82125D Nondisplaced fracture of lateral condyle of left tibia, subsequent encounter for closed fracture with routine healing: Secondary | ICD-10-CM | POA: Diagnosis not present

## 2020-12-17 DIAGNOSIS — F32A Depression, unspecified: Secondary | ICD-10-CM | POA: Diagnosis not present

## 2020-12-17 DIAGNOSIS — M19041 Primary osteoarthritis, right hand: Secondary | ICD-10-CM | POA: Diagnosis not present

## 2020-12-17 DIAGNOSIS — I252 Old myocardial infarction: Secondary | ICD-10-CM | POA: Diagnosis not present

## 2020-12-17 DIAGNOSIS — E785 Hyperlipidemia, unspecified: Secondary | ICD-10-CM | POA: Diagnosis not present

## 2020-12-17 DIAGNOSIS — I129 Hypertensive chronic kidney disease with stage 1 through stage 4 chronic kidney disease, or unspecified chronic kidney disease: Secondary | ICD-10-CM | POA: Diagnosis not present

## 2020-12-17 DIAGNOSIS — N3281 Overactive bladder: Secondary | ICD-10-CM | POA: Diagnosis not present

## 2020-12-17 DIAGNOSIS — I2510 Atherosclerotic heart disease of native coronary artery without angina pectoris: Secondary | ICD-10-CM | POA: Diagnosis not present

## 2020-12-17 DIAGNOSIS — E11621 Type 2 diabetes mellitus with foot ulcer: Secondary | ICD-10-CM | POA: Diagnosis not present

## 2020-12-17 DIAGNOSIS — L97521 Non-pressure chronic ulcer of other part of left foot limited to breakdown of skin: Secondary | ICD-10-CM | POA: Diagnosis not present

## 2020-12-17 DIAGNOSIS — M47812 Spondylosis without myelopathy or radiculopathy, cervical region: Secondary | ICD-10-CM | POA: Diagnosis not present

## 2020-12-17 DIAGNOSIS — H913 Deaf nonspeaking, not elsewhere classified: Secondary | ICD-10-CM | POA: Diagnosis not present

## 2020-12-17 DIAGNOSIS — W19XXXD Unspecified fall, subsequent encounter: Secondary | ICD-10-CM | POA: Diagnosis not present

## 2020-12-17 DIAGNOSIS — N183 Chronic kidney disease, stage 3 unspecified: Secondary | ICD-10-CM | POA: Diagnosis not present

## 2020-12-17 DIAGNOSIS — M19042 Primary osteoarthritis, left hand: Secondary | ICD-10-CM | POA: Diagnosis not present

## 2020-12-17 DIAGNOSIS — K219 Gastro-esophageal reflux disease without esophagitis: Secondary | ICD-10-CM | POA: Diagnosis not present

## 2020-12-17 DIAGNOSIS — I700 Atherosclerosis of aorta: Secondary | ICD-10-CM | POA: Diagnosis not present

## 2020-12-17 DIAGNOSIS — K5900 Constipation, unspecified: Secondary | ICD-10-CM | POA: Diagnosis not present

## 2020-12-17 DIAGNOSIS — D631 Anemia in chronic kidney disease: Secondary | ICD-10-CM | POA: Diagnosis not present

## 2020-12-17 DIAGNOSIS — R339 Retention of urine, unspecified: Secondary | ICD-10-CM | POA: Diagnosis not present

## 2020-12-17 DIAGNOSIS — E1122 Type 2 diabetes mellitus with diabetic chronic kidney disease: Secondary | ICD-10-CM | POA: Diagnosis not present

## 2020-12-17 DIAGNOSIS — S82125D Nondisplaced fracture of lateral condyle of left tibia, subsequent encounter for closed fracture with routine healing: Secondary | ICD-10-CM | POA: Diagnosis not present

## 2020-12-17 DIAGNOSIS — E1140 Type 2 diabetes mellitus with diabetic neuropathy, unspecified: Secondary | ICD-10-CM | POA: Diagnosis not present

## 2020-12-20 DIAGNOSIS — E1140 Type 2 diabetes mellitus with diabetic neuropathy, unspecified: Secondary | ICD-10-CM | POA: Diagnosis not present

## 2020-12-20 DIAGNOSIS — E11621 Type 2 diabetes mellitus with foot ulcer: Secondary | ICD-10-CM | POA: Diagnosis not present

## 2020-12-20 DIAGNOSIS — L97521 Non-pressure chronic ulcer of other part of left foot limited to breakdown of skin: Secondary | ICD-10-CM | POA: Diagnosis not present

## 2020-12-21 DIAGNOSIS — M19042 Primary osteoarthritis, left hand: Secondary | ICD-10-CM | POA: Diagnosis not present

## 2020-12-21 DIAGNOSIS — E1122 Type 2 diabetes mellitus with diabetic chronic kidney disease: Secondary | ICD-10-CM | POA: Diagnosis not present

## 2020-12-21 DIAGNOSIS — M19041 Primary osteoarthritis, right hand: Secondary | ICD-10-CM | POA: Diagnosis not present

## 2020-12-21 DIAGNOSIS — R339 Retention of urine, unspecified: Secondary | ICD-10-CM | POA: Diagnosis not present

## 2020-12-21 DIAGNOSIS — E1140 Type 2 diabetes mellitus with diabetic neuropathy, unspecified: Secondary | ICD-10-CM | POA: Diagnosis not present

## 2020-12-21 DIAGNOSIS — I700 Atherosclerosis of aorta: Secondary | ICD-10-CM | POA: Diagnosis not present

## 2020-12-21 DIAGNOSIS — S82125D Nondisplaced fracture of lateral condyle of left tibia, subsequent encounter for closed fracture with routine healing: Secondary | ICD-10-CM | POA: Diagnosis not present

## 2020-12-21 DIAGNOSIS — I2510 Atherosclerotic heart disease of native coronary artery without angina pectoris: Secondary | ICD-10-CM | POA: Diagnosis not present

## 2020-12-21 DIAGNOSIS — I252 Old myocardial infarction: Secondary | ICD-10-CM | POA: Diagnosis not present

## 2020-12-21 DIAGNOSIS — F32A Depression, unspecified: Secondary | ICD-10-CM | POA: Diagnosis not present

## 2020-12-21 DIAGNOSIS — W19XXXD Unspecified fall, subsequent encounter: Secondary | ICD-10-CM | POA: Diagnosis not present

## 2020-12-21 DIAGNOSIS — E11621 Type 2 diabetes mellitus with foot ulcer: Secondary | ICD-10-CM | POA: Diagnosis not present

## 2020-12-21 DIAGNOSIS — D631 Anemia in chronic kidney disease: Secondary | ICD-10-CM | POA: Diagnosis not present

## 2020-12-21 DIAGNOSIS — E785 Hyperlipidemia, unspecified: Secondary | ICD-10-CM | POA: Diagnosis not present

## 2020-12-21 DIAGNOSIS — H913 Deaf nonspeaking, not elsewhere classified: Secondary | ICD-10-CM | POA: Diagnosis not present

## 2020-12-21 DIAGNOSIS — L97521 Non-pressure chronic ulcer of other part of left foot limited to breakdown of skin: Secondary | ICD-10-CM | POA: Diagnosis not present

## 2020-12-21 DIAGNOSIS — N3281 Overactive bladder: Secondary | ICD-10-CM | POA: Diagnosis not present

## 2020-12-21 DIAGNOSIS — M47812 Spondylosis without myelopathy or radiculopathy, cervical region: Secondary | ICD-10-CM | POA: Diagnosis not present

## 2020-12-21 DIAGNOSIS — N183 Chronic kidney disease, stage 3 unspecified: Secondary | ICD-10-CM | POA: Diagnosis not present

## 2020-12-21 DIAGNOSIS — K5900 Constipation, unspecified: Secondary | ICD-10-CM | POA: Diagnosis not present

## 2020-12-21 DIAGNOSIS — K219 Gastro-esophageal reflux disease without esophagitis: Secondary | ICD-10-CM | POA: Diagnosis not present

## 2020-12-21 DIAGNOSIS — I129 Hypertensive chronic kidney disease with stage 1 through stage 4 chronic kidney disease, or unspecified chronic kidney disease: Secondary | ICD-10-CM | POA: Diagnosis not present

## 2020-12-23 DIAGNOSIS — I700 Atherosclerosis of aorta: Secondary | ICD-10-CM | POA: Diagnosis not present

## 2020-12-23 DIAGNOSIS — N3281 Overactive bladder: Secondary | ICD-10-CM | POA: Diagnosis not present

## 2020-12-23 DIAGNOSIS — M47812 Spondylosis without myelopathy or radiculopathy, cervical region: Secondary | ICD-10-CM | POA: Diagnosis not present

## 2020-12-23 DIAGNOSIS — M19042 Primary osteoarthritis, left hand: Secondary | ICD-10-CM | POA: Diagnosis not present

## 2020-12-23 DIAGNOSIS — N183 Chronic kidney disease, stage 3 unspecified: Secondary | ICD-10-CM | POA: Diagnosis not present

## 2020-12-23 DIAGNOSIS — S82125D Nondisplaced fracture of lateral condyle of left tibia, subsequent encounter for closed fracture with routine healing: Secondary | ICD-10-CM | POA: Diagnosis not present

## 2020-12-23 DIAGNOSIS — E11621 Type 2 diabetes mellitus with foot ulcer: Secondary | ICD-10-CM | POA: Diagnosis not present

## 2020-12-23 DIAGNOSIS — I252 Old myocardial infarction: Secondary | ICD-10-CM | POA: Diagnosis not present

## 2020-12-23 DIAGNOSIS — K219 Gastro-esophageal reflux disease without esophagitis: Secondary | ICD-10-CM | POA: Diagnosis not present

## 2020-12-23 DIAGNOSIS — R339 Retention of urine, unspecified: Secondary | ICD-10-CM | POA: Diagnosis not present

## 2020-12-23 DIAGNOSIS — E1140 Type 2 diabetes mellitus with diabetic neuropathy, unspecified: Secondary | ICD-10-CM | POA: Diagnosis not present

## 2020-12-23 DIAGNOSIS — F32A Depression, unspecified: Secondary | ICD-10-CM | POA: Diagnosis not present

## 2020-12-23 DIAGNOSIS — M19041 Primary osteoarthritis, right hand: Secondary | ICD-10-CM | POA: Diagnosis not present

## 2020-12-23 DIAGNOSIS — I2510 Atherosclerotic heart disease of native coronary artery without angina pectoris: Secondary | ICD-10-CM | POA: Diagnosis not present

## 2020-12-23 DIAGNOSIS — D631 Anemia in chronic kidney disease: Secondary | ICD-10-CM | POA: Diagnosis not present

## 2020-12-23 DIAGNOSIS — W19XXXD Unspecified fall, subsequent encounter: Secondary | ICD-10-CM | POA: Diagnosis not present

## 2020-12-23 DIAGNOSIS — H913 Deaf nonspeaking, not elsewhere classified: Secondary | ICD-10-CM | POA: Diagnosis not present

## 2020-12-23 DIAGNOSIS — K5900 Constipation, unspecified: Secondary | ICD-10-CM | POA: Diagnosis not present

## 2020-12-23 DIAGNOSIS — E785 Hyperlipidemia, unspecified: Secondary | ICD-10-CM | POA: Diagnosis not present

## 2020-12-23 DIAGNOSIS — E1122 Type 2 diabetes mellitus with diabetic chronic kidney disease: Secondary | ICD-10-CM | POA: Diagnosis not present

## 2020-12-23 DIAGNOSIS — I129 Hypertensive chronic kidney disease with stage 1 through stage 4 chronic kidney disease, or unspecified chronic kidney disease: Secondary | ICD-10-CM | POA: Diagnosis not present

## 2020-12-23 DIAGNOSIS — L97521 Non-pressure chronic ulcer of other part of left foot limited to breakdown of skin: Secondary | ICD-10-CM | POA: Diagnosis not present

## 2020-12-28 DIAGNOSIS — I2510 Atherosclerotic heart disease of native coronary artery without angina pectoris: Secondary | ICD-10-CM | POA: Diagnosis not present

## 2020-12-28 DIAGNOSIS — N3281 Overactive bladder: Secondary | ICD-10-CM | POA: Diagnosis not present

## 2020-12-28 DIAGNOSIS — K5900 Constipation, unspecified: Secondary | ICD-10-CM | POA: Diagnosis not present

## 2020-12-28 DIAGNOSIS — F32A Depression, unspecified: Secondary | ICD-10-CM | POA: Diagnosis not present

## 2020-12-28 DIAGNOSIS — E1140 Type 2 diabetes mellitus with diabetic neuropathy, unspecified: Secondary | ICD-10-CM | POA: Diagnosis not present

## 2020-12-28 DIAGNOSIS — L97521 Non-pressure chronic ulcer of other part of left foot limited to breakdown of skin: Secondary | ICD-10-CM | POA: Diagnosis not present

## 2020-12-28 DIAGNOSIS — H913 Deaf nonspeaking, not elsewhere classified: Secondary | ICD-10-CM | POA: Diagnosis not present

## 2020-12-28 DIAGNOSIS — K219 Gastro-esophageal reflux disease without esophagitis: Secondary | ICD-10-CM | POA: Diagnosis not present

## 2020-12-28 DIAGNOSIS — S82125D Nondisplaced fracture of lateral condyle of left tibia, subsequent encounter for closed fracture with routine healing: Secondary | ICD-10-CM | POA: Diagnosis not present

## 2020-12-28 DIAGNOSIS — E785 Hyperlipidemia, unspecified: Secondary | ICD-10-CM | POA: Diagnosis not present

## 2020-12-28 DIAGNOSIS — I700 Atherosclerosis of aorta: Secondary | ICD-10-CM | POA: Diagnosis not present

## 2020-12-28 DIAGNOSIS — M19041 Primary osteoarthritis, right hand: Secondary | ICD-10-CM | POA: Diagnosis not present

## 2020-12-28 DIAGNOSIS — D631 Anemia in chronic kidney disease: Secondary | ICD-10-CM | POA: Diagnosis not present

## 2020-12-28 DIAGNOSIS — E11621 Type 2 diabetes mellitus with foot ulcer: Secondary | ICD-10-CM | POA: Diagnosis not present

## 2020-12-28 DIAGNOSIS — I129 Hypertensive chronic kidney disease with stage 1 through stage 4 chronic kidney disease, or unspecified chronic kidney disease: Secondary | ICD-10-CM | POA: Diagnosis not present

## 2020-12-28 DIAGNOSIS — R339 Retention of urine, unspecified: Secondary | ICD-10-CM | POA: Diagnosis not present

## 2020-12-28 DIAGNOSIS — I252 Old myocardial infarction: Secondary | ICD-10-CM | POA: Diagnosis not present

## 2020-12-28 DIAGNOSIS — E1122 Type 2 diabetes mellitus with diabetic chronic kidney disease: Secondary | ICD-10-CM | POA: Diagnosis not present

## 2020-12-28 DIAGNOSIS — M47812 Spondylosis without myelopathy or radiculopathy, cervical region: Secondary | ICD-10-CM | POA: Diagnosis not present

## 2020-12-28 DIAGNOSIS — N183 Chronic kidney disease, stage 3 unspecified: Secondary | ICD-10-CM | POA: Diagnosis not present

## 2020-12-28 DIAGNOSIS — M19042 Primary osteoarthritis, left hand: Secondary | ICD-10-CM | POA: Diagnosis not present

## 2020-12-28 DIAGNOSIS — W19XXXD Unspecified fall, subsequent encounter: Secondary | ICD-10-CM | POA: Diagnosis not present

## 2021-01-04 ENCOUNTER — Other Ambulatory Visit: Payer: Self-pay | Admitting: Interventional Cardiology

## 2021-01-06 DIAGNOSIS — L97521 Non-pressure chronic ulcer of other part of left foot limited to breakdown of skin: Secondary | ICD-10-CM | POA: Diagnosis not present

## 2021-01-06 DIAGNOSIS — K219 Gastro-esophageal reflux disease without esophagitis: Secondary | ICD-10-CM | POA: Diagnosis not present

## 2021-01-06 DIAGNOSIS — E1140 Type 2 diabetes mellitus with diabetic neuropathy, unspecified: Secondary | ICD-10-CM | POA: Diagnosis not present

## 2021-01-06 DIAGNOSIS — D631 Anemia in chronic kidney disease: Secondary | ICD-10-CM | POA: Diagnosis not present

## 2021-01-06 DIAGNOSIS — S82125D Nondisplaced fracture of lateral condyle of left tibia, subsequent encounter for closed fracture with routine healing: Secondary | ICD-10-CM | POA: Diagnosis not present

## 2021-01-06 DIAGNOSIS — E1122 Type 2 diabetes mellitus with diabetic chronic kidney disease: Secondary | ICD-10-CM | POA: Diagnosis not present

## 2021-01-06 DIAGNOSIS — N183 Chronic kidney disease, stage 3 unspecified: Secondary | ICD-10-CM | POA: Diagnosis not present

## 2021-01-06 DIAGNOSIS — I252 Old myocardial infarction: Secondary | ICD-10-CM | POA: Diagnosis not present

## 2021-01-06 DIAGNOSIS — E785 Hyperlipidemia, unspecified: Secondary | ICD-10-CM | POA: Diagnosis not present

## 2021-01-06 DIAGNOSIS — M19041 Primary osteoarthritis, right hand: Secondary | ICD-10-CM | POA: Diagnosis not present

## 2021-01-06 DIAGNOSIS — R339 Retention of urine, unspecified: Secondary | ICD-10-CM | POA: Diagnosis not present

## 2021-01-06 DIAGNOSIS — M47812 Spondylosis without myelopathy or radiculopathy, cervical region: Secondary | ICD-10-CM | POA: Diagnosis not present

## 2021-01-06 DIAGNOSIS — N3281 Overactive bladder: Secondary | ICD-10-CM | POA: Diagnosis not present

## 2021-01-06 DIAGNOSIS — K5900 Constipation, unspecified: Secondary | ICD-10-CM | POA: Diagnosis not present

## 2021-01-06 DIAGNOSIS — W19XXXD Unspecified fall, subsequent encounter: Secondary | ICD-10-CM | POA: Diagnosis not present

## 2021-01-06 DIAGNOSIS — F32A Depression, unspecified: Secondary | ICD-10-CM | POA: Diagnosis not present

## 2021-01-06 DIAGNOSIS — M19042 Primary osteoarthritis, left hand: Secondary | ICD-10-CM | POA: Diagnosis not present

## 2021-01-06 DIAGNOSIS — H913 Deaf nonspeaking, not elsewhere classified: Secondary | ICD-10-CM | POA: Diagnosis not present

## 2021-01-06 DIAGNOSIS — I700 Atherosclerosis of aorta: Secondary | ICD-10-CM | POA: Diagnosis not present

## 2021-01-06 DIAGNOSIS — I2510 Atherosclerotic heart disease of native coronary artery without angina pectoris: Secondary | ICD-10-CM | POA: Diagnosis not present

## 2021-01-06 DIAGNOSIS — E11621 Type 2 diabetes mellitus with foot ulcer: Secondary | ICD-10-CM | POA: Diagnosis not present

## 2021-01-06 DIAGNOSIS — I129 Hypertensive chronic kidney disease with stage 1 through stage 4 chronic kidney disease, or unspecified chronic kidney disease: Secondary | ICD-10-CM | POA: Diagnosis not present

## 2021-01-10 DIAGNOSIS — E1140 Type 2 diabetes mellitus with diabetic neuropathy, unspecified: Secondary | ICD-10-CM | POA: Diagnosis not present

## 2021-01-10 DIAGNOSIS — M19042 Primary osteoarthritis, left hand: Secondary | ICD-10-CM | POA: Diagnosis not present

## 2021-01-10 DIAGNOSIS — E785 Hyperlipidemia, unspecified: Secondary | ICD-10-CM | POA: Diagnosis not present

## 2021-01-10 DIAGNOSIS — K219 Gastro-esophageal reflux disease without esophagitis: Secondary | ICD-10-CM | POA: Diagnosis not present

## 2021-01-10 DIAGNOSIS — M47812 Spondylosis without myelopathy or radiculopathy, cervical region: Secondary | ICD-10-CM | POA: Diagnosis not present

## 2021-01-10 DIAGNOSIS — E11621 Type 2 diabetes mellitus with foot ulcer: Secondary | ICD-10-CM | POA: Diagnosis not present

## 2021-01-10 DIAGNOSIS — N183 Chronic kidney disease, stage 3 unspecified: Secondary | ICD-10-CM | POA: Diagnosis not present

## 2021-01-10 DIAGNOSIS — S82125D Nondisplaced fracture of lateral condyle of left tibia, subsequent encounter for closed fracture with routine healing: Secondary | ICD-10-CM | POA: Diagnosis not present

## 2021-01-10 DIAGNOSIS — H913 Deaf nonspeaking, not elsewhere classified: Secondary | ICD-10-CM | POA: Diagnosis not present

## 2021-01-10 DIAGNOSIS — L97521 Non-pressure chronic ulcer of other part of left foot limited to breakdown of skin: Secondary | ICD-10-CM | POA: Diagnosis not present

## 2021-01-10 DIAGNOSIS — I700 Atherosclerosis of aorta: Secondary | ICD-10-CM | POA: Diagnosis not present

## 2021-01-10 DIAGNOSIS — W19XXXD Unspecified fall, subsequent encounter: Secondary | ICD-10-CM | POA: Diagnosis not present

## 2021-01-10 DIAGNOSIS — K5900 Constipation, unspecified: Secondary | ICD-10-CM | POA: Diagnosis not present

## 2021-01-10 DIAGNOSIS — D631 Anemia in chronic kidney disease: Secondary | ICD-10-CM | POA: Diagnosis not present

## 2021-01-10 DIAGNOSIS — F32A Depression, unspecified: Secondary | ICD-10-CM | POA: Diagnosis not present

## 2021-01-10 DIAGNOSIS — I252 Old myocardial infarction: Secondary | ICD-10-CM | POA: Diagnosis not present

## 2021-01-10 DIAGNOSIS — I2510 Atherosclerotic heart disease of native coronary artery without angina pectoris: Secondary | ICD-10-CM | POA: Diagnosis not present

## 2021-01-10 DIAGNOSIS — N3281 Overactive bladder: Secondary | ICD-10-CM | POA: Diagnosis not present

## 2021-01-10 DIAGNOSIS — M19041 Primary osteoarthritis, right hand: Secondary | ICD-10-CM | POA: Diagnosis not present

## 2021-01-10 DIAGNOSIS — I129 Hypertensive chronic kidney disease with stage 1 through stage 4 chronic kidney disease, or unspecified chronic kidney disease: Secondary | ICD-10-CM | POA: Diagnosis not present

## 2021-01-10 DIAGNOSIS — R339 Retention of urine, unspecified: Secondary | ICD-10-CM | POA: Diagnosis not present

## 2021-01-10 DIAGNOSIS — E1122 Type 2 diabetes mellitus with diabetic chronic kidney disease: Secondary | ICD-10-CM | POA: Diagnosis not present

## 2021-01-12 DIAGNOSIS — R339 Retention of urine, unspecified: Secondary | ICD-10-CM | POA: Diagnosis not present

## 2021-01-12 DIAGNOSIS — M19042 Primary osteoarthritis, left hand: Secondary | ICD-10-CM | POA: Diagnosis not present

## 2021-01-12 DIAGNOSIS — N183 Chronic kidney disease, stage 3 unspecified: Secondary | ICD-10-CM | POA: Diagnosis not present

## 2021-01-12 DIAGNOSIS — I2510 Atherosclerotic heart disease of native coronary artery without angina pectoris: Secondary | ICD-10-CM | POA: Diagnosis not present

## 2021-01-12 DIAGNOSIS — H913 Deaf nonspeaking, not elsewhere classified: Secondary | ICD-10-CM | POA: Diagnosis not present

## 2021-01-12 DIAGNOSIS — E1122 Type 2 diabetes mellitus with diabetic chronic kidney disease: Secondary | ICD-10-CM | POA: Diagnosis not present

## 2021-01-12 DIAGNOSIS — L97521 Non-pressure chronic ulcer of other part of left foot limited to breakdown of skin: Secondary | ICD-10-CM | POA: Diagnosis not present

## 2021-01-12 DIAGNOSIS — I700 Atherosclerosis of aorta: Secondary | ICD-10-CM | POA: Diagnosis not present

## 2021-01-12 DIAGNOSIS — F32A Depression, unspecified: Secondary | ICD-10-CM | POA: Diagnosis not present

## 2021-01-12 DIAGNOSIS — M19041 Primary osteoarthritis, right hand: Secondary | ICD-10-CM | POA: Diagnosis not present

## 2021-01-12 DIAGNOSIS — K219 Gastro-esophageal reflux disease without esophagitis: Secondary | ICD-10-CM | POA: Diagnosis not present

## 2021-01-12 DIAGNOSIS — E1140 Type 2 diabetes mellitus with diabetic neuropathy, unspecified: Secondary | ICD-10-CM | POA: Diagnosis not present

## 2021-01-12 DIAGNOSIS — N3281 Overactive bladder: Secondary | ICD-10-CM | POA: Diagnosis not present

## 2021-01-12 DIAGNOSIS — I129 Hypertensive chronic kidney disease with stage 1 through stage 4 chronic kidney disease, or unspecified chronic kidney disease: Secondary | ICD-10-CM | POA: Diagnosis not present

## 2021-01-12 DIAGNOSIS — E11621 Type 2 diabetes mellitus with foot ulcer: Secondary | ICD-10-CM | POA: Diagnosis not present

## 2021-01-12 DIAGNOSIS — M47812 Spondylosis without myelopathy or radiculopathy, cervical region: Secondary | ICD-10-CM | POA: Diagnosis not present

## 2021-01-12 DIAGNOSIS — K5900 Constipation, unspecified: Secondary | ICD-10-CM | POA: Diagnosis not present

## 2021-01-12 DIAGNOSIS — I252 Old myocardial infarction: Secondary | ICD-10-CM | POA: Diagnosis not present

## 2021-01-12 DIAGNOSIS — W19XXXD Unspecified fall, subsequent encounter: Secondary | ICD-10-CM | POA: Diagnosis not present

## 2021-01-12 DIAGNOSIS — E785 Hyperlipidemia, unspecified: Secondary | ICD-10-CM | POA: Diagnosis not present

## 2021-01-12 DIAGNOSIS — D631 Anemia in chronic kidney disease: Secondary | ICD-10-CM | POA: Diagnosis not present

## 2021-01-12 DIAGNOSIS — S82125D Nondisplaced fracture of lateral condyle of left tibia, subsequent encounter for closed fracture with routine healing: Secondary | ICD-10-CM | POA: Diagnosis not present

## 2021-01-17 DIAGNOSIS — I700 Atherosclerosis of aorta: Secondary | ICD-10-CM | POA: Diagnosis not present

## 2021-01-17 DIAGNOSIS — S82125D Nondisplaced fracture of lateral condyle of left tibia, subsequent encounter for closed fracture with routine healing: Secondary | ICD-10-CM | POA: Diagnosis not present

## 2021-01-17 DIAGNOSIS — H913 Deaf nonspeaking, not elsewhere classified: Secondary | ICD-10-CM | POA: Diagnosis not present

## 2021-01-17 DIAGNOSIS — R339 Retention of urine, unspecified: Secondary | ICD-10-CM | POA: Diagnosis not present

## 2021-01-17 DIAGNOSIS — N3281 Overactive bladder: Secondary | ICD-10-CM | POA: Diagnosis not present

## 2021-01-17 DIAGNOSIS — E1122 Type 2 diabetes mellitus with diabetic chronic kidney disease: Secondary | ICD-10-CM | POA: Diagnosis not present

## 2021-01-17 DIAGNOSIS — N183 Chronic kidney disease, stage 3 unspecified: Secondary | ICD-10-CM | POA: Diagnosis not present

## 2021-01-17 DIAGNOSIS — M19041 Primary osteoarthritis, right hand: Secondary | ICD-10-CM | POA: Diagnosis not present

## 2021-01-17 DIAGNOSIS — I252 Old myocardial infarction: Secondary | ICD-10-CM | POA: Diagnosis not present

## 2021-01-17 DIAGNOSIS — D631 Anemia in chronic kidney disease: Secondary | ICD-10-CM | POA: Diagnosis not present

## 2021-01-17 DIAGNOSIS — F32A Depression, unspecified: Secondary | ICD-10-CM | POA: Diagnosis not present

## 2021-01-17 DIAGNOSIS — W19XXXD Unspecified fall, subsequent encounter: Secondary | ICD-10-CM | POA: Diagnosis not present

## 2021-01-17 DIAGNOSIS — E785 Hyperlipidemia, unspecified: Secondary | ICD-10-CM | POA: Diagnosis not present

## 2021-01-17 DIAGNOSIS — M47812 Spondylosis without myelopathy or radiculopathy, cervical region: Secondary | ICD-10-CM | POA: Diagnosis not present

## 2021-01-17 DIAGNOSIS — L97521 Non-pressure chronic ulcer of other part of left foot limited to breakdown of skin: Secondary | ICD-10-CM | POA: Diagnosis not present

## 2021-01-17 DIAGNOSIS — E11621 Type 2 diabetes mellitus with foot ulcer: Secondary | ICD-10-CM | POA: Diagnosis not present

## 2021-01-17 DIAGNOSIS — K219 Gastro-esophageal reflux disease without esophagitis: Secondary | ICD-10-CM | POA: Diagnosis not present

## 2021-01-17 DIAGNOSIS — K5900 Constipation, unspecified: Secondary | ICD-10-CM | POA: Diagnosis not present

## 2021-01-17 DIAGNOSIS — I129 Hypertensive chronic kidney disease with stage 1 through stage 4 chronic kidney disease, or unspecified chronic kidney disease: Secondary | ICD-10-CM | POA: Diagnosis not present

## 2021-01-17 DIAGNOSIS — E1140 Type 2 diabetes mellitus with diabetic neuropathy, unspecified: Secondary | ICD-10-CM | POA: Diagnosis not present

## 2021-01-17 DIAGNOSIS — I2510 Atherosclerotic heart disease of native coronary artery without angina pectoris: Secondary | ICD-10-CM | POA: Diagnosis not present

## 2021-01-17 DIAGNOSIS — M19042 Primary osteoarthritis, left hand: Secondary | ICD-10-CM | POA: Diagnosis not present

## 2021-01-25 DIAGNOSIS — I2510 Atherosclerotic heart disease of native coronary artery without angina pectoris: Secondary | ICD-10-CM | POA: Diagnosis not present

## 2021-01-25 DIAGNOSIS — M19041 Primary osteoarthritis, right hand: Secondary | ICD-10-CM | POA: Diagnosis not present

## 2021-01-25 DIAGNOSIS — E11621 Type 2 diabetes mellitus with foot ulcer: Secondary | ICD-10-CM | POA: Diagnosis not present

## 2021-01-25 DIAGNOSIS — E1140 Type 2 diabetes mellitus with diabetic neuropathy, unspecified: Secondary | ICD-10-CM | POA: Diagnosis not present

## 2021-01-25 DIAGNOSIS — F32A Depression, unspecified: Secondary | ICD-10-CM | POA: Diagnosis not present

## 2021-01-25 DIAGNOSIS — I700 Atherosclerosis of aorta: Secondary | ICD-10-CM | POA: Diagnosis not present

## 2021-01-25 DIAGNOSIS — L97521 Non-pressure chronic ulcer of other part of left foot limited to breakdown of skin: Secondary | ICD-10-CM | POA: Diagnosis not present

## 2021-01-25 DIAGNOSIS — I129 Hypertensive chronic kidney disease with stage 1 through stage 4 chronic kidney disease, or unspecified chronic kidney disease: Secondary | ICD-10-CM | POA: Diagnosis not present

## 2021-01-25 DIAGNOSIS — R339 Retention of urine, unspecified: Secondary | ICD-10-CM | POA: Diagnosis not present

## 2021-01-25 DIAGNOSIS — K219 Gastro-esophageal reflux disease without esophagitis: Secondary | ICD-10-CM | POA: Diagnosis not present

## 2021-01-25 DIAGNOSIS — H913 Deaf nonspeaking, not elsewhere classified: Secondary | ICD-10-CM | POA: Diagnosis not present

## 2021-01-25 DIAGNOSIS — M19042 Primary osteoarthritis, left hand: Secondary | ICD-10-CM | POA: Diagnosis not present

## 2021-01-25 DIAGNOSIS — E785 Hyperlipidemia, unspecified: Secondary | ICD-10-CM | POA: Diagnosis not present

## 2021-01-25 DIAGNOSIS — I252 Old myocardial infarction: Secondary | ICD-10-CM | POA: Diagnosis not present

## 2021-01-25 DIAGNOSIS — E1122 Type 2 diabetes mellitus with diabetic chronic kidney disease: Secondary | ICD-10-CM | POA: Diagnosis not present

## 2021-01-25 DIAGNOSIS — N3281 Overactive bladder: Secondary | ICD-10-CM | POA: Diagnosis not present

## 2021-01-25 DIAGNOSIS — N183 Chronic kidney disease, stage 3 unspecified: Secondary | ICD-10-CM | POA: Diagnosis not present

## 2021-01-25 DIAGNOSIS — K5900 Constipation, unspecified: Secondary | ICD-10-CM | POA: Diagnosis not present

## 2021-01-25 DIAGNOSIS — M47812 Spondylosis without myelopathy or radiculopathy, cervical region: Secondary | ICD-10-CM | POA: Diagnosis not present

## 2021-01-25 DIAGNOSIS — D631 Anemia in chronic kidney disease: Secondary | ICD-10-CM | POA: Diagnosis not present

## 2021-01-25 DIAGNOSIS — W19XXXD Unspecified fall, subsequent encounter: Secondary | ICD-10-CM | POA: Diagnosis not present

## 2021-01-25 DIAGNOSIS — S82125D Nondisplaced fracture of lateral condyle of left tibia, subsequent encounter for closed fracture with routine healing: Secondary | ICD-10-CM | POA: Diagnosis not present

## 2021-02-02 DIAGNOSIS — K219 Gastro-esophageal reflux disease without esophagitis: Secondary | ICD-10-CM | POA: Diagnosis not present

## 2021-02-02 DIAGNOSIS — L97521 Non-pressure chronic ulcer of other part of left foot limited to breakdown of skin: Secondary | ICD-10-CM | POA: Diagnosis not present

## 2021-02-02 DIAGNOSIS — I252 Old myocardial infarction: Secondary | ICD-10-CM | POA: Diagnosis not present

## 2021-02-02 DIAGNOSIS — H913 Deaf nonspeaking, not elsewhere classified: Secondary | ICD-10-CM | POA: Diagnosis not present

## 2021-02-02 DIAGNOSIS — M19042 Primary osteoarthritis, left hand: Secondary | ICD-10-CM | POA: Diagnosis not present

## 2021-02-02 DIAGNOSIS — N183 Chronic kidney disease, stage 3 unspecified: Secondary | ICD-10-CM | POA: Diagnosis not present

## 2021-02-02 DIAGNOSIS — I2510 Atherosclerotic heart disease of native coronary artery without angina pectoris: Secondary | ICD-10-CM | POA: Diagnosis not present

## 2021-02-02 DIAGNOSIS — N3281 Overactive bladder: Secondary | ICD-10-CM | POA: Diagnosis not present

## 2021-02-02 DIAGNOSIS — E1122 Type 2 diabetes mellitus with diabetic chronic kidney disease: Secondary | ICD-10-CM | POA: Diagnosis not present

## 2021-02-02 DIAGNOSIS — S82125D Nondisplaced fracture of lateral condyle of left tibia, subsequent encounter for closed fracture with routine healing: Secondary | ICD-10-CM | POA: Diagnosis not present

## 2021-02-02 DIAGNOSIS — M19041 Primary osteoarthritis, right hand: Secondary | ICD-10-CM | POA: Diagnosis not present

## 2021-02-02 DIAGNOSIS — E1140 Type 2 diabetes mellitus with diabetic neuropathy, unspecified: Secondary | ICD-10-CM | POA: Diagnosis not present

## 2021-02-02 DIAGNOSIS — I700 Atherosclerosis of aorta: Secondary | ICD-10-CM | POA: Diagnosis not present

## 2021-02-02 DIAGNOSIS — E785 Hyperlipidemia, unspecified: Secondary | ICD-10-CM | POA: Diagnosis not present

## 2021-02-02 DIAGNOSIS — K5900 Constipation, unspecified: Secondary | ICD-10-CM | POA: Diagnosis not present

## 2021-02-02 DIAGNOSIS — F32A Depression, unspecified: Secondary | ICD-10-CM | POA: Diagnosis not present

## 2021-02-02 DIAGNOSIS — D631 Anemia in chronic kidney disease: Secondary | ICD-10-CM | POA: Diagnosis not present

## 2021-02-02 DIAGNOSIS — M47812 Spondylosis without myelopathy or radiculopathy, cervical region: Secondary | ICD-10-CM | POA: Diagnosis not present

## 2021-02-02 DIAGNOSIS — R339 Retention of urine, unspecified: Secondary | ICD-10-CM | POA: Diagnosis not present

## 2021-02-02 DIAGNOSIS — E11621 Type 2 diabetes mellitus with foot ulcer: Secondary | ICD-10-CM | POA: Diagnosis not present

## 2021-02-02 DIAGNOSIS — I129 Hypertensive chronic kidney disease with stage 1 through stage 4 chronic kidney disease, or unspecified chronic kidney disease: Secondary | ICD-10-CM | POA: Diagnosis not present

## 2021-02-02 DIAGNOSIS — W19XXXD Unspecified fall, subsequent encounter: Secondary | ICD-10-CM | POA: Diagnosis not present

## 2021-03-11 ENCOUNTER — Encounter (HOSPITAL_COMMUNITY): Payer: Self-pay | Admitting: Emergency Medicine

## 2021-03-11 ENCOUNTER — Inpatient Hospital Stay (HOSPITAL_COMMUNITY)
Admission: EM | Admit: 2021-03-11 | Discharge: 2021-03-16 | DRG: 637 | Disposition: A | Discharge status: Skilled Nursing Facility | Payer: Medicare Other | Attending: Internal Medicine | Admitting: Internal Medicine

## 2021-03-11 ENCOUNTER — Emergency Department (HOSPITAL_COMMUNITY): Payer: Medicare Other

## 2021-03-11 ENCOUNTER — Other Ambulatory Visit: Payer: Self-pay

## 2021-03-11 DIAGNOSIS — L03115 Cellulitis of right lower limb: Secondary | ICD-10-CM | POA: Diagnosis not present

## 2021-03-11 DIAGNOSIS — Z96643 Presence of artificial hip joint, bilateral: Secondary | ICD-10-CM | POA: Diagnosis not present

## 2021-03-11 DIAGNOSIS — K5900 Constipation, unspecified: Secondary | ICD-10-CM | POA: Diagnosis not present

## 2021-03-11 DIAGNOSIS — Z7984 Long term (current) use of oral hypoglycemic drugs: Secondary | ICD-10-CM | POA: Diagnosis not present

## 2021-03-11 DIAGNOSIS — Z888 Allergy status to other drugs, medicaments and biological substances status: Secondary | ICD-10-CM

## 2021-03-11 DIAGNOSIS — N1831 Chronic kidney disease, stage 3a: Secondary | ICD-10-CM | POA: Diagnosis not present

## 2021-03-11 DIAGNOSIS — E1142 Type 2 diabetes mellitus with diabetic polyneuropathy: Secondary | ICD-10-CM | POA: Diagnosis present

## 2021-03-11 DIAGNOSIS — K219 Gastro-esophageal reflux disease without esophagitis: Secondary | ICD-10-CM | POA: Diagnosis not present

## 2021-03-11 DIAGNOSIS — I872 Venous insufficiency (chronic) (peripheral): Secondary | ICD-10-CM | POA: Diagnosis present

## 2021-03-11 DIAGNOSIS — W19XXXA Unspecified fall, initial encounter: Secondary | ICD-10-CM | POA: Diagnosis present

## 2021-03-11 DIAGNOSIS — Z9181 History of falling: Secondary | ICD-10-CM | POA: Diagnosis not present

## 2021-03-11 DIAGNOSIS — Z79899 Other long term (current) drug therapy: Secondary | ICD-10-CM

## 2021-03-11 DIAGNOSIS — Z794 Long term (current) use of insulin: Secondary | ICD-10-CM | POA: Diagnosis not present

## 2021-03-11 DIAGNOSIS — Z833 Family history of diabetes mellitus: Secondary | ICD-10-CM | POA: Diagnosis not present

## 2021-03-11 DIAGNOSIS — Z7952 Long term (current) use of systemic steroids: Secondary | ICD-10-CM | POA: Diagnosis not present

## 2021-03-11 DIAGNOSIS — L8990 Pressure ulcer of unspecified site, unspecified stage: Secondary | ICD-10-CM | POA: Insufficient documentation

## 2021-03-11 DIAGNOSIS — H9190 Unspecified hearing loss, unspecified ear: Secondary | ICD-10-CM | POA: Diagnosis present

## 2021-03-11 DIAGNOSIS — Z20822 Contact with and (suspected) exposure to covid-19: Secondary | ICD-10-CM | POA: Diagnosis not present

## 2021-03-11 DIAGNOSIS — E782 Mixed hyperlipidemia: Secondary | ICD-10-CM | POA: Diagnosis present

## 2021-03-11 DIAGNOSIS — R0602 Shortness of breath: Secondary | ICD-10-CM | POA: Diagnosis not present

## 2021-03-11 DIAGNOSIS — T148XXA Other injury of unspecified body region, initial encounter: Secondary | ICD-10-CM | POA: Diagnosis present

## 2021-03-11 DIAGNOSIS — I739 Peripheral vascular disease, unspecified: Secondary | ICD-10-CM | POA: Diagnosis not present

## 2021-03-11 DIAGNOSIS — I10 Essential (primary) hypertension: Secondary | ICD-10-CM | POA: Diagnosis present

## 2021-03-11 DIAGNOSIS — E1122 Type 2 diabetes mellitus with diabetic chronic kidney disease: Secondary | ICD-10-CM | POA: Diagnosis not present

## 2021-03-11 DIAGNOSIS — N179 Acute kidney failure, unspecified: Secondary | ICD-10-CM | POA: Diagnosis present

## 2021-03-11 DIAGNOSIS — I2510 Atherosclerotic heart disease of native coronary artery without angina pectoris: Secondary | ICD-10-CM | POA: Diagnosis present

## 2021-03-11 DIAGNOSIS — E1165 Type 2 diabetes mellitus with hyperglycemia: Secondary | ICD-10-CM | POA: Diagnosis present

## 2021-03-11 DIAGNOSIS — I130 Hypertensive heart and chronic kidney disease with heart failure and stage 1 through stage 4 chronic kidney disease, or unspecified chronic kidney disease: Secondary | ICD-10-CM | POA: Diagnosis present

## 2021-03-11 DIAGNOSIS — Z8249 Family history of ischemic heart disease and other diseases of the circulatory system: Secondary | ICD-10-CM

## 2021-03-11 DIAGNOSIS — E119 Type 2 diabetes mellitus without complications: Secondary | ICD-10-CM | POA: Diagnosis not present

## 2021-03-11 DIAGNOSIS — I5033 Acute on chronic diastolic (congestive) heart failure: Secondary | ICD-10-CM | POA: Diagnosis not present

## 2021-03-11 DIAGNOSIS — N189 Chronic kidney disease, unspecified: Secondary | ICD-10-CM

## 2021-03-11 DIAGNOSIS — Z981 Arthrodesis status: Secondary | ICD-10-CM | POA: Diagnosis not present

## 2021-03-11 DIAGNOSIS — E11628 Type 2 diabetes mellitus with other skin complications: Principal | ICD-10-CM | POA: Diagnosis present

## 2021-03-11 DIAGNOSIS — G9009 Other idiopathic peripheral autonomic neuropathy: Secondary | ICD-10-CM | POA: Diagnosis not present

## 2021-03-11 DIAGNOSIS — R262 Difficulty in walking, not elsewhere classified: Secondary | ICD-10-CM | POA: Diagnosis not present

## 2021-03-11 DIAGNOSIS — Z96653 Presence of artificial knee joint, bilateral: Secondary | ICD-10-CM | POA: Diagnosis not present

## 2021-03-11 DIAGNOSIS — L089 Local infection of the skin and subcutaneous tissue, unspecified: Secondary | ICD-10-CM

## 2021-03-11 DIAGNOSIS — Z23 Encounter for immunization: Secondary | ICD-10-CM | POA: Diagnosis not present

## 2021-03-11 DIAGNOSIS — M19071 Primary osteoarthritis, right ankle and foot: Secondary | ICD-10-CM | POA: Diagnosis not present

## 2021-03-11 DIAGNOSIS — L0390 Cellulitis, unspecified: Secondary | ICD-10-CM | POA: Diagnosis not present

## 2021-03-11 DIAGNOSIS — D649 Anemia, unspecified: Secondary | ICD-10-CM | POA: Diagnosis not present

## 2021-03-11 DIAGNOSIS — M01X71 Direct infection of right ankle and foot in infectious and parasitic diseases classified elsewhere: Secondary | ICD-10-CM | POA: Diagnosis not present

## 2021-03-11 DIAGNOSIS — Z8711 Personal history of peptic ulcer disease: Secondary | ICD-10-CM

## 2021-03-11 DIAGNOSIS — S91301A Unspecified open wound, right foot, initial encounter: Secondary | ICD-10-CM | POA: Diagnosis not present

## 2021-03-11 DIAGNOSIS — F411 Generalized anxiety disorder: Secondary | ICD-10-CM | POA: Diagnosis present

## 2021-03-11 DIAGNOSIS — Z743 Need for continuous supervision: Secondary | ICD-10-CM | POA: Diagnosis not present

## 2021-03-11 DIAGNOSIS — N3281 Overactive bladder: Secondary | ICD-10-CM | POA: Diagnosis not present

## 2021-03-11 DIAGNOSIS — L89612 Pressure ulcer of right heel, stage 2: Secondary | ICD-10-CM | POA: Diagnosis present

## 2021-03-11 DIAGNOSIS — Z89432 Acquired absence of left foot: Secondary | ICD-10-CM | POA: Diagnosis not present

## 2021-03-11 DIAGNOSIS — Z7401 Bed confinement status: Secondary | ICD-10-CM | POA: Diagnosis not present

## 2021-03-11 DIAGNOSIS — R339 Retention of urine, unspecified: Secondary | ICD-10-CM | POA: Diagnosis not present

## 2021-03-11 DIAGNOSIS — F32A Depression, unspecified: Secondary | ICD-10-CM | POA: Diagnosis not present

## 2021-03-11 DIAGNOSIS — E1149 Type 2 diabetes mellitus with other diabetic neurological complication: Secondary | ICD-10-CM | POA: Diagnosis not present

## 2021-03-11 DIAGNOSIS — E785 Hyperlipidemia, unspecified: Secondary | ICD-10-CM | POA: Diagnosis not present

## 2021-03-11 DIAGNOSIS — N183 Chronic kidney disease, stage 3 unspecified: Secondary | ICD-10-CM | POA: Diagnosis not present

## 2021-03-11 DIAGNOSIS — H913 Deaf nonspeaking, not elsewhere classified: Secondary | ICD-10-CM | POA: Diagnosis not present

## 2021-03-11 DIAGNOSIS — Z803 Family history of malignant neoplasm of breast: Secondary | ICD-10-CM | POA: Diagnosis not present

## 2021-03-11 DIAGNOSIS — R600 Localized edema: Secondary | ICD-10-CM | POA: Diagnosis not present

## 2021-03-11 DIAGNOSIS — M6281 Muscle weakness (generalized): Secondary | ICD-10-CM | POA: Diagnosis not present

## 2021-03-11 DIAGNOSIS — Z8049 Family history of malignant neoplasm of other genital organs: Secondary | ICD-10-CM | POA: Diagnosis not present

## 2021-03-11 DIAGNOSIS — R2681 Unsteadiness on feet: Secondary | ICD-10-CM | POA: Diagnosis not present

## 2021-03-11 DIAGNOSIS — M7989 Other specified soft tissue disorders: Secondary | ICD-10-CM | POA: Diagnosis not present

## 2021-03-11 DIAGNOSIS — I214 Non-ST elevation (NSTEMI) myocardial infarction: Secondary | ICD-10-CM | POA: Diagnosis not present

## 2021-03-11 DIAGNOSIS — S82125D Nondisplaced fracture of lateral condyle of left tibia, subsequent encounter for closed fracture with routine healing: Secondary | ICD-10-CM | POA: Diagnosis not present

## 2021-03-11 LAB — CBC WITH DIFFERENTIAL/PLATELET
Abs Immature Granulocytes: 0.05 10*3/uL (ref 0.00–0.07)
Basophils Absolute: 0 10*3/uL (ref 0.0–0.1)
Basophils Relative: 0 %
Eosinophils Absolute: 0.2 10*3/uL (ref 0.0–0.5)
Eosinophils Relative: 2 %
HCT: 38.5 % — ABNORMAL LOW (ref 39.0–52.0)
Hemoglobin: 12.6 g/dL — ABNORMAL LOW (ref 13.0–17.0)
Immature Granulocytes: 1 %
Lymphocytes Relative: 22 %
Lymphs Abs: 2 10*3/uL (ref 0.7–4.0)
MCH: 28.2 pg (ref 26.0–34.0)
MCHC: 32.7 g/dL (ref 30.0–36.0)
MCV: 86.1 fL (ref 80.0–100.0)
Monocytes Absolute: 1.1 10*3/uL — ABNORMAL HIGH (ref 0.1–1.0)
Monocytes Relative: 12 %
Neutro Abs: 5.8 10*3/uL (ref 1.7–7.7)
Neutrophils Relative %: 63 %
Platelets: 242 10*3/uL (ref 150–400)
RBC: 4.47 MIL/uL (ref 4.22–5.81)
RDW: 14.6 % (ref 11.5–15.5)
WBC: 9.3 10*3/uL (ref 4.0–10.5)
nRBC: 0 % (ref 0.0–0.2)

## 2021-03-11 LAB — LACTIC ACID, PLASMA: Lactic Acid, Venous: 1.2 mmol/L (ref 0.5–1.9)

## 2021-03-11 LAB — BASIC METABOLIC PANEL
Anion gap: 9 (ref 5–15)
BUN: 50 mg/dL — ABNORMAL HIGH (ref 8–23)
CO2: 28 mmol/L (ref 22–32)
Calcium: 8.6 mg/dL — ABNORMAL LOW (ref 8.9–10.3)
Chloride: 95 mmol/L — ABNORMAL LOW (ref 98–111)
Creatinine, Ser: 1.87 mg/dL — ABNORMAL HIGH (ref 0.61–1.24)
GFR, Estimated: 37 mL/min — ABNORMAL LOW (ref >60)
Glucose, Bld: 315 mg/dL — ABNORMAL HIGH (ref 70–99)
Potassium: 4 mmol/L (ref 3.5–5.1)
Sodium: 132 mmol/L — ABNORMAL LOW (ref 135–145)

## 2021-03-11 MED ORDER — PIPERACILLIN-TAZOBACTAM 3.375 G IVPB 30 MIN
3.3750 g | Freq: Once | INTRAVENOUS | Status: AC
  Administered 2021-03-12: 3.375 g via INTRAVENOUS
  Filled 2021-03-11: qty 50

## 2021-03-11 MED ORDER — VANCOMYCIN HCL IN DEXTROSE 1-5 GM/200ML-% IV SOLN
1000.0000 mg | Freq: Once | INTRAVENOUS | Status: AC
  Administered 2021-03-12: 1000 mg via INTRAVENOUS
  Filled 2021-03-11: qty 200

## 2021-03-11 NOTE — ED Triage Notes (Signed)
Previous right heel ulcer that has gotten worse. Reports fall Monday and thinks shoe may have rubbed it. Swelling and redness noted traveling up leg near calf.

## 2021-03-12 ENCOUNTER — Inpatient Hospital Stay (HOSPITAL_COMMUNITY): Payer: Medicare Other

## 2021-03-12 DIAGNOSIS — E119 Type 2 diabetes mellitus without complications: Secondary | ICD-10-CM | POA: Diagnosis not present

## 2021-03-12 DIAGNOSIS — Z96653 Presence of artificial knee joint, bilateral: Secondary | ICD-10-CM | POA: Diagnosis present

## 2021-03-12 DIAGNOSIS — Z803 Family history of malignant neoplasm of breast: Secondary | ICD-10-CM | POA: Diagnosis not present

## 2021-03-12 DIAGNOSIS — W19XXXA Unspecified fall, initial encounter: Secondary | ICD-10-CM | POA: Diagnosis present

## 2021-03-12 DIAGNOSIS — Z96643 Presence of artificial hip joint, bilateral: Secondary | ICD-10-CM | POA: Diagnosis present

## 2021-03-12 DIAGNOSIS — I10 Essential (primary) hypertension: Secondary | ICD-10-CM

## 2021-03-12 DIAGNOSIS — Z794 Long term (current) use of insulin: Secondary | ICD-10-CM | POA: Diagnosis not present

## 2021-03-12 DIAGNOSIS — Z7952 Long term (current) use of systemic steroids: Secondary | ICD-10-CM | POA: Diagnosis not present

## 2021-03-12 DIAGNOSIS — L0390 Cellulitis, unspecified: Secondary | ICD-10-CM | POA: Diagnosis present

## 2021-03-12 DIAGNOSIS — Z89432 Acquired absence of left foot: Secondary | ICD-10-CM | POA: Diagnosis not present

## 2021-03-12 DIAGNOSIS — F411 Generalized anxiety disorder: Secondary | ICD-10-CM | POA: Diagnosis present

## 2021-03-12 DIAGNOSIS — I130 Hypertensive heart and chronic kidney disease with heart failure and stage 1 through stage 4 chronic kidney disease, or unspecified chronic kidney disease: Secondary | ICD-10-CM | POA: Diagnosis present

## 2021-03-12 DIAGNOSIS — Z833 Family history of diabetes mellitus: Secondary | ICD-10-CM | POA: Diagnosis not present

## 2021-03-12 DIAGNOSIS — T148XXA Other injury of unspecified body region, initial encounter: Secondary | ICD-10-CM | POA: Diagnosis present

## 2021-03-12 DIAGNOSIS — I5033 Acute on chronic diastolic (congestive) heart failure: Secondary | ICD-10-CM | POA: Diagnosis present

## 2021-03-12 DIAGNOSIS — N1831 Chronic kidney disease, stage 3a: Secondary | ICD-10-CM | POA: Diagnosis present

## 2021-03-12 DIAGNOSIS — E11628 Type 2 diabetes mellitus with other skin complications: Secondary | ICD-10-CM | POA: Diagnosis present

## 2021-03-12 DIAGNOSIS — N179 Acute kidney failure, unspecified: Secondary | ICD-10-CM | POA: Diagnosis present

## 2021-03-12 DIAGNOSIS — Z20822 Contact with and (suspected) exposure to covid-19: Secondary | ICD-10-CM | POA: Diagnosis present

## 2021-03-12 DIAGNOSIS — E1142 Type 2 diabetes mellitus with diabetic polyneuropathy: Secondary | ICD-10-CM | POA: Diagnosis present

## 2021-03-12 DIAGNOSIS — Z79899 Other long term (current) drug therapy: Secondary | ICD-10-CM | POA: Diagnosis not present

## 2021-03-12 DIAGNOSIS — L89612 Pressure ulcer of right heel, stage 2: Secondary | ICD-10-CM | POA: Diagnosis present

## 2021-03-12 DIAGNOSIS — Z8049 Family history of malignant neoplasm of other genital organs: Secondary | ICD-10-CM | POA: Diagnosis not present

## 2021-03-12 DIAGNOSIS — E1122 Type 2 diabetes mellitus with diabetic chronic kidney disease: Secondary | ICD-10-CM | POA: Diagnosis present

## 2021-03-12 DIAGNOSIS — E782 Mixed hyperlipidemia: Secondary | ICD-10-CM | POA: Diagnosis present

## 2021-03-12 DIAGNOSIS — Z8249 Family history of ischemic heart disease and other diseases of the circulatory system: Secondary | ICD-10-CM | POA: Diagnosis not present

## 2021-03-12 DIAGNOSIS — L03115 Cellulitis of right lower limb: Secondary | ICD-10-CM | POA: Diagnosis present

## 2021-03-12 DIAGNOSIS — E1165 Type 2 diabetes mellitus with hyperglycemia: Secondary | ICD-10-CM | POA: Diagnosis present

## 2021-03-12 DIAGNOSIS — H9190 Unspecified hearing loss, unspecified ear: Secondary | ICD-10-CM | POA: Diagnosis present

## 2021-03-12 DIAGNOSIS — Z7984 Long term (current) use of oral hypoglycemic drugs: Secondary | ICD-10-CM | POA: Diagnosis not present

## 2021-03-12 LAB — BASIC METABOLIC PANEL
Anion gap: 10 (ref 5–15)
BUN: 43 mg/dL — ABNORMAL HIGH (ref 8–23)
CO2: 29 mmol/L (ref 22–32)
Calcium: 8.5 mg/dL — ABNORMAL LOW (ref 8.9–10.3)
Chloride: 98 mmol/L (ref 98–111)
Creatinine, Ser: 1.55 mg/dL — ABNORMAL HIGH (ref 0.61–1.24)
GFR, Estimated: 46 mL/min — ABNORMAL LOW (ref >60)
Glucose, Bld: 127 mg/dL — ABNORMAL HIGH (ref 70–99)
Potassium: 3.2 mmol/L — ABNORMAL LOW (ref 3.5–5.1)
Sodium: 137 mmol/L (ref 135–145)

## 2021-03-12 LAB — CBC
HCT: 35.6 % — ABNORMAL LOW (ref 39.0–52.0)
Hemoglobin: 11.8 g/dL — ABNORMAL LOW (ref 13.0–17.0)
MCH: 28.6 pg (ref 26.0–34.0)
MCHC: 33.1 g/dL (ref 30.0–36.0)
MCV: 86.2 fL (ref 80.0–100.0)
Platelets: 226 10*3/uL (ref 150–400)
RBC: 4.13 MIL/uL — ABNORMAL LOW (ref 4.22–5.81)
RDW: 14.6 % (ref 11.5–15.5)
WBC: 7.5 10*3/uL (ref 4.0–10.5)
nRBC: 0 % (ref 0.0–0.2)

## 2021-03-12 LAB — SARS CORONAVIRUS 2 (TAT 6-24 HRS): SARS Coronavirus 2: NEGATIVE

## 2021-03-12 LAB — SEDIMENTATION RATE: Sed Rate: 57 mm/hr — ABNORMAL HIGH (ref 0–16)

## 2021-03-12 LAB — CBG MONITORING, ED
Glucose-Capillary: 222 mg/dL — ABNORMAL HIGH (ref 70–99)
Glucose-Capillary: 137 mg/dL — ABNORMAL HIGH (ref 70–99)
Glucose-Capillary: 148 mg/dL — ABNORMAL HIGH (ref 70–99)

## 2021-03-12 LAB — C-REACTIVE PROTEIN: CRP: 16.9 mg/dL — ABNORMAL HIGH (ref <1.0)

## 2021-03-12 LAB — LACTIC ACID, PLASMA: Lactic Acid, Venous: 0.9 mmol/L (ref 0.5–1.9)

## 2021-03-12 LAB — GLUCOSE, CAPILLARY
Glucose-Capillary: 135 mg/dL — ABNORMAL HIGH (ref 70–99)
Glucose-Capillary: 113 mg/dL — ABNORMAL HIGH (ref 70–99)

## 2021-03-12 MED ORDER — INSULIN ASPART 100 UNIT/ML IJ SOLN
10.0000 [IU] | Freq: Once | INTRAMUSCULAR | Status: DC
  Filled 2021-03-12: qty 0.1

## 2021-03-12 MED ORDER — INSULIN ASPART 100 UNIT/ML IJ SOLN
0.0000 [IU] | INTRAMUSCULAR | Status: DC
  Administered 2021-03-12: 3 [IU] via SUBCUTANEOUS
  Administered 2021-03-12 (×3): 1 [IU] via SUBCUTANEOUS
  Administered 2021-03-13: 2 [IU] via SUBCUTANEOUS
  Administered 2021-03-13: 1 [IU] via SUBCUTANEOUS
  Administered 2021-03-13: 5 [IU] via SUBCUTANEOUS
  Administered 2021-03-14: 1 [IU] via SUBCUTANEOUS
  Filled 2021-03-12: qty 0.09

## 2021-03-12 MED ORDER — LORAZEPAM 2 MG/ML IJ SOLN
0.5000 mg | Freq: Once | INTRAMUSCULAR | Status: AC
  Administered 2021-03-12: 0.5 mg via INTRAVENOUS
  Filled 2021-03-12: qty 1

## 2021-03-12 MED ORDER — PIPERACILLIN-TAZOBACTAM 3.375 G IVPB
3.3750 g | Freq: Three times a day (TID) | INTRAVENOUS | Status: DC
  Administered 2021-03-12 – 2021-03-15 (×9): 3.375 g via INTRAVENOUS
  Filled 2021-03-12 (×9): qty 50

## 2021-03-12 MED ORDER — LACTATED RINGERS IV SOLN: INTRAVENOUS | Status: AC

## 2021-03-12 MED ORDER — VANCOMYCIN HCL 1500 MG/300ML IV SOLN
1500.0000 mg | INTRAVENOUS | Status: DC
  Administered 2021-03-13: 1500 mg via INTRAVENOUS
  Filled 2021-03-12: qty 300

## 2021-03-12 MED ORDER — OXYCODONE HCL 5 MG PO TABS
5.0000 mg | ORAL_TABLET | Freq: Four times a day (QID) | ORAL | Status: DC | PRN
  Administered 2021-03-12 – 2021-03-14 (×5): 5 mg via ORAL
  Administered 2021-03-15: 10 mg via ORAL
  Filled 2021-03-12 (×5): qty 1
  Filled 2021-03-12: qty 2

## 2021-03-12 NOTE — Plan of Care (Signed)
  Problem: Education: Goal: Knowledge of General Education information will improve Description: Including pain rating scale, medication(s)/side effects and non-pharmacologic comfort measures Outcome: Progressing   Problem: Health Behavior/Discharge Planning: Goal: Ability to manage health-related needs will improve Outcome: Progressing   Problem: Clinical Measurements: Goal: Ability to maintain clinical measurements within normal limits will improve Outcome: Progressing Goal: Will remain free from infection Outcome: Progressing   Problem: Activity: Goal: Risk for activity intolerance will decrease Outcome: Progressing   Problem: Nutrition: Goal: Adequate nutrition will be maintained Outcome: Progressing   Problem: Elimination: Goal: Will not experience complications related to urinary retention Outcome: Progressing   Problem: Pain Managment: Goal: General experience of comfort will improve Outcome: Progressing   Problem: Safety: Goal: Ability to remain free from injury will improve Outcome: Progressing   Problem: Skin Integrity: Goal: Risk for impaired skin integrity will decrease Outcome: Progressing

## 2021-03-12 NOTE — Progress Notes (Addendum)
Pharmacy Antibiotic Note  Christian [name] is a 80 y.o. male admitted on 03/11/2021 with concern for wound infection. PT has a  history of complex diabetic foot wound requiring partial foot amputation of his left foot.  Pharmacy has been consulted to dose vancomycin and zosyn for cellulitis  Plan: Vancomycin 1gm IV x 1 then 1500 q36h (AUC 534.6, Scr 1.87) Zosyn 3.375g IV Q8H infused over 4hrs. Follow renal function and clinical course  Height: 5\' 8"  (172.7 cm) Weight: 83.9 kg (185 lb) IBW/kg (Calculated) : 68.4  Temp (24hrs), Avg:98.6 F (37 C), Min:98.6 F (37 C), Max:98.6 F (37 C)  Recent Labs  Lab 03/11/21 2136 03/11/21 2321  WBC 9.3  --   CREATININE 1.87*  --   LATICACIDVEN  --  1.2    Estimated Creatinine Clearance: 34.9 mL/min (A) (by C-G formula based on SCr of 1.87 mg/dL (H)).    Allergies  Allergen Reactions   Lipitor [Atorvastatin] Other (See Comments)    Antimicrobials this admission 7/2 vanc >> 7/2 zosyn >>  Dose adjustments this admission:   Microbiology results: 7/1 BCx:  Thank you for allowing pharmacy to be a part of this patient's care.  Dolly Rias RPh 03/12/2021, 12:51 AM

## 2021-03-12 NOTE — H&P (Signed)
History and Physical    [name] MCN:[id number] DOB: November 21, 1940 DOA: 03/11/2021  PCP: [name], DO  Patient coming from: Home  I have personally briefly reviewed patient's old medical records in [place]  Chief Complaint: right foot wound  HPI: Christian [name] is a 80 y.o. male with medical history significant for type 2 diabetes with peripheral neuropathy, PAD, history of transmetatarsal amputation of left foot, hypertension, CAD, CKD stage IIIa, GERD and hyperlipidemia who presents with right foot wound.   Pt needs sign language and daughter was at bedside to assist. Pt lives alone and children visits him often. He noted some redness and swelling of his right leg about a week ago and then later had a fall which is frequent for him due to balance issues. He finally took off shoes and daughter noted large wound on bottom of foot. She thinks perhaps it has been there for a while and he has not noticed due to neuropathy. He denies any fever or chills. No nausea, vomiting or diarrhea.   ED Course: He was afebrile, normotensive. No leukocytosis. Cr of 1.87 from 1.5 in January. BG of 315 and pt states he has been off medication for diabetes for some time.   He was started on Vancomycin and Zosyn. ED physician also spoke with orthopedic Dr. Ninfa Linden who recommended admission to [facility] in case of intervention and hospitalist was called for admission.  Review of Systems: Unable to fully obtain since pt needs sign language. All pertinent positives and negatives as above.   Past Medical History:  Diagnosis Date   Angina pectoris [facility])    Nuclear stress test 8/18: EF 59, fixed inf-lat defect likely attenuation, no ischemia, Low Risk   Arthritis    CMT (Charcot-Marie-Tooth disease)    Deafness    Diverticulosis    DM (diabetes mellitus) ([place])    HLD (hyperlipidemia)    HTN (hypertension)    Iron deficiency anemia    Multiple gastric ulcers    Overactive bladder     Ulcer     Past Surgical History:  Procedure Laterality Date   COLONOSCOPY N/A 08/27/2013   Procedure: COLONOSCOPY;  Surgeon: Jerene Bears, MD;  Location: [facility];  Service: Gastroenterology;  Laterality: N/A;   ESOPHAGOGASTRODUODENOSCOPY N/A 08/27/2013   Procedure: ESOPHAGOGASTRODUODENOSCOPY (EGD);  Surgeon: Jerene Bears, MD;  Location: [facility];  Service: Gastroenterology;  Laterality: N/A;   FOOT AMPUTATION THROUGH METATARSAL Left    FOOT SURGERY     age 57   TOTAL HIP ARTHROPLASTY Bilateral 2002, 2003   TOTAL KNEE ARTHROPLASTY Bilateral 1999     reports that he has never smoked. He has never used smokeless tobacco. He reports that he does not drink alcohol and does not use drugs. Social History  Allergies  Allergen Reactions   Lipitor [Atorvastatin] Other (See Comments)    Family History  Problem Relation Age of Onset   Cervical cancer Mother        mets   Breast cancer Sister        mets   Diabetes Sister    Diabetes Brother    Heart disease Sister    Cervical cancer Sister      Prior to Admission medications   Medication Sig Start Date End Date Taking? Authorizing Provider  ACCU-CHEK SMARTVIEW test strip USE 1 STRIP TO CHECK GLUCOSE TWICE DAILY AS DIRECTED 02/15/20   [provider]  amLODipine (NORVASC) 10 MG tablet TAKE 1 TABLET  BY MOUTH  DAILY 01/05/21   Jettie Booze, MD  Cholecalciferol (VITAMIN D) 1000 UNITS capsule Take 1,000 Units by mouth 3 (three) times a week. Monday, Wednesday, Saturday    [provider]  cyclobenzaprine (FLEXERIL) 10 MG tablet Take 10 mg by mouth 3 (three) times daily as needed for muscle spasms.  03/04/14   [provider]  diclofenac (VOLTAREN) 75 MG EC tablet Take 75 mg by mouth 2 (two) times daily as needed for pain. 04/29/17   [provider]  dicyclomine (BENTYL) 20 MG tablet Take 1 tablet by mouth daily. 07/09/16   [provider]  fexofenadine (ALLEGRA) 180 MG tablet  Take 180 mg by mouth daily as needed for allergies.     [provider]  gentamicin ointment (GARAMYCIN) 0.1 % For  wound care on left foot 03/05/20   Landis Martins, DPM  GuaiFENesin (MUCINEX PO) Take 1 tablet by mouth daily as needed (to loosen phlegm).    [provider]  insulin glargine (LANTUS) 100 units/mL SOLN Inject 10 Units into the skin at bedtime.     [provider]  iron polysaccharides (NIFEREX) 150 MG capsule Take by mouth. 11/15/19   [provider]  isosorbide mononitrate (IMDUR) 60 MG 24 hr tablet Take 1 tablet (60 mg total) by mouth daily. 08/25/20   Jettie Booze, MD  LORazepam (ATIVAN) 1 MG tablet Take 1 mg by mouth daily as needed. 01/22/20   [provider]  LORazepam (ATIVAN) 2 MG tablet Take 2 mg by mouth at bedtime. Take one half tablet by mouth in the morning and one half tablet by mouth in the evening     [provider]  metFORMIN (GLUCOPHAGE) 1000 MG tablet Take 1,000 mg by mouth 2 (two) times daily with a meal.    [provider]  nitroGLYCERIN (NITROSTAT) 0.4 MG SL tablet Place 1 tablet (0.4 mg total) under the tongue every 5 (five) minutes as needed for chest pain. Please keep upcoming appt in May with Dr. Irish Lack. Thank you 01/09/20   Jettie Booze, MD  oxybutynin (DITROPAN-XL) 5 MG 24 hr tablet Take 5 mg by mouth daily. 12/16/19   [provider]  pantoprazole (PROTONIX) 40 MG tablet Take 1 tablet (40 mg total) by mouth daily. 12/27/17   Pyrtle, Lajuan Lines, MD  pioglitazone (ACTOS) 30 MG tablet Take 30 mg by mouth daily.    [provider]  polyethylene glycol (MIRALAX / GLYCOLAX) packet Take 17 g by mouth 2 (two) times daily as needed (constipation).     [provider]  pravastatin (PRAVACHOL) 40 MG tablet Take 40 mg by mouth daily. 12/25/19   [provider]  predniSONE (DELTASONE) 10 MG tablet Take 2 tablets (20 mg total) by mouth daily with breakfast. 08/09/17    [name], MD  rosuvastatin (CRESTOR) 10 MG tablet Take 10 mg by mouth at bedtime.     [provider]  sulfamethoxazole-trimethoprim (BACTRIM) 400-80 MG tablet Take 1 tablet by mouth 2 (two) times daily. 03/02/20   Landis Martins, DPM  torsemide (DEMADEX) 20 MG tablet Take by mouth. 01/28/20 02/27/20  [provider]  traMADol Veatrice Bourbon) 50 MG tablet Take 1-2 tablet up to 3 times daly as needed for pain 03/30/14   Jerene Bears, MD    Physical Exam: Vitals:   03/11/21 2114 03/11/21 2330  BP: 134/76 132/68  Pulse: 87 81  Resp: 16 18  Temp: 98.6 F (37 C)  TempSrc: Oral   SpO2: 94% 96%  Weight: 83.9 kg   Height: 5\' 8"  (1.727 m)     Constitutional: NAD, calm, comfortable, elderly gentleman laying at 20 degree incline in bed Vitals:   03/11/21 2114 03/11/21 2330  BP: 134/76 132/68  Pulse: 87 81  Resp: 16 18  Temp: 98.6 F (37 C)   TempSrc: Oral   SpO2: 94% 96%  Weight: 83.9 kg   Height: 5\' 8"  (7.591 m)    Eyes: PERRL, lids and conjunctivae normal ENMT: Mucous membranes are moist.  Neck: normal, supple Respiratory: clear to auscultation bilaterally, no wheezing, no crackles. Normal respiratory effort. No accessory muscle use.  Cardiovascular: Regular rate and rhythm, no murmurs / rubs / gallops.  +3 pitting edema of the right lower extremity . abdomen: no tenderness, no masses palpated. No hepatosplenomegaly. Bowel sounds positive.  Musculoskeletal: no clubbing / cyanosis.  Left transmetatarsal amputation of the foot.   Skin: Large right plantar wound with granulation tissue with spreading erythema around right foot up to distal pretibial region.      Neurologic: CN 2-12 grossly intact. Sensation intact, DTR normal. Strength 5/5 in all 4.  Psychiatric: Normal judgment and insight. Alert and oriented x 3. Normal mood.     Labs on Admission: I have personally reviewed following labs and imaging studies  CBC: Recent Labs  Lab 03/11/21 2136  WBC  9.3  NEUTROABS 5.8  HGB 12.6*  HCT 38.5*  MCV 86.1  PLT 638   Basic Metabolic Panel: Recent Labs  Lab 03/11/21 2136  NA 132*  K 4.0  CL 95*  CO2 28  GLUCOSE 315*  BUN 50*  CREATININE 1.87*  CALCIUM 8.6*   GFR: Estimated Creatinine Clearance: 34.9 mL/min (A) (by C-G formula based on SCr of 1.87 mg/dL (H)). Liver Function Tests: No results for input(s): AST, ALT, ALKPHOS, BILITOT, PROT, ALBUMIN in the last 168 hours. No results for input(s): LIPASE, AMYLASE in the last 168 hours. No results for input(s): AMMONIA in the last 168 hours. Coagulation Profile: No results for input(s): INR, PROTIME in the last 168 hours. Cardiac Enzymes: No results for input(s): CKTOTAL, CKMB, CKMBINDEX, TROPONINI in the last 168 hours. BNP (last 3 results) No results for input(s): PROBNP in the last 8760 hours. HbA1C: No results for input(s): HGBA1C in the last 72 hours. CBG: No results for input(s): GLUCAP in the last 168 hours. Lipid Profile: No results for input(s): CHOL, HDL, LDLCALC, TRIG, CHOLHDL, LDLDIRECT in the last 72 hours. Thyroid Function Tests: No results for input(s): TSH, T4TOTAL, FREET4, T3FREE, THYROIDAB in the last 72 hours. Anemia Panel: No results for input(s): VITAMINB12, FOLATE, FERRITIN, TIBC, IRON, RETICCTPCT in the last 72 hours. Urine analysis: No results found for: COLORURINE, APPEARANCEUR, LABSPEC, PHURINE, GLUCOSEU, HGBUR, BILIRUBINUR, KETONESUR, PROTEINUR, UROBILINOGEN, NITRITE, LEUKOCYTESUR  Radiological Exams on Admission: DG Foot Complete Right  Result Date: 03/11/2021 CLINICAL DATA:  Right heel pain and possible infection. Worsening heel ulcer. EXAM: RIGHT FOOT COMPLETE - 3+ VIEW COMPARISON:  Foot radiograph 10/23/2019 FINDINGS: Chronic deformity of the distal metatarsals and digits, presumably postsurgical. K-wires within the first proximal phalanx. Similar appearance to prior exam. Moderate midfoot degenerative change. No visualized fracture. No evidence of  erosion, focal bone abnormality or bone destruction. Chronic Achilles tendon enthesophyte. Dorsal soft tissue edema. Known heel ulcer is not well seen by radiograph. There is no soft tissue air or radiopaque foreign body. Vascular calcifications are seen. IMPRESSION: 1. No radiographic findings of osteomyelitis or acute osseous  abnormality. 2. Dorsal soft tissue edema. No soft tissue air or radiopaque foreign body. 3. Chronic deformities of the distal metatarsals and digits, presumably postsurgical. Moderate midfoot degenerative change. Electronically Signed   By: Keith Rake M.D.   On: 03/11/2021 22:02      Assessment/Plan Right foot cellulitis w/hx of diabetes with peripheral neuropathy -continue on Vancomycin and Zosyn -No osteomyelitis seen on x-ray but given extent of his cellulitis and history of transmetatarsal amputation already on his left foot will obtain MRI.  Unfortunately will need to do without contrast given worsening renal insufficiency. -Orthopedic team is aware and has recommended he be transferred to [facility] in case of any surgical intervention  Type 2 diabetes with hyperglycemia -BG of greater than 300 on admission.  Give 10 units of NovoLog.  Placed on sensitive sliding scale.  AKI on CKD stage IIIa - Creatinine of 1.87 from a prior of 1.5.  Monitor on continuous IV fluids  Hypertension -Controlled. Pt does not have his med list right now and will need to do med rec tomorrow    Level of care: Med-Surg  Status is: Inpatient  Remains inpatient appropriate because:Inpatient level of care appropriate due to severity of illness  Dispo: The patient is from: Home              Anticipated d/c is to: Home              Patient currently is not medically stable to d/c.   Difficult to place patient No         Orene Desanctis DO [facility]   If 7PM-7AM, please contact night-coverage [email or web address]   03/12/2021, 12:51 AM

## 2021-03-12 NOTE — Progress Notes (Signed)
Patient ID: Christian [name], male   DOB: Nov 11, 1940, 80 y.o.   MRN: [id number] I did speak with the EDP last evening on this patient and saw the photos of his leg and foot in the media section of epic.  Also reviewed the plain films and the MRI of his right lower extremity.  From an orthopedic perspective, I would recommend elevation and medical management with IV antibiotics.  There does not seem to be an indication right now for surgery in terms of a drainable abscess or anything that needs to be opened up.  There is no evidence of acute osteomyelitis.  I will check on him sometime this weekend and he will likely need follow-up with Dr. Sharol Given as an outpatient in a week.

## 2021-03-12 NOTE — ED Provider Notes (Signed)
[facility] DEPT Provider Note   CSN: [id number] Arrival date & time: 03/11/21  2105     History Chief Complaint  Patient presents with   Foot Swelling   Wound Infection    QAADIR KENT is a 80 y.o. male.  Presents to ER with concern for wound infection.  Patient reports that he thinks on Monday he may have injured the base of his right foot.  Over the last few days he has had worsening redness on his foot that spread to his leg.  Has had a history of complex diabetic foot wound requiring partial foot amputation of his left foot.  Denies fevers chills or other generalized symptoms.  Additional history obtained from daughter at bedside.  Additional history obtained from review of chart. Sharol Given performed surgery on left foot.   HPI     Past Medical History:  Diagnosis Date   Angina pectoris ([place])    Nuclear stress test 8/18: EF 59, fixed inf-lat defect likely attenuation, no ischemia, Low Risk   Arthritis    CMT (Charcot-Marie-Tooth disease)    Deafness    Diverticulosis    DM (diabetes mellitus) ([place])    HLD (hyperlipidemia)    HTN (hypertension)    Iron deficiency anemia    Multiple gastric ulcers    Overactive bladder    Ulcer     Patient Active Problem List   Diagnosis Date Noted   Decreased activities of daily living (ADL) 02/26/2020   Dysphagia 02/26/2020   Impaired functional mobility, balance, gait, and endurance 02/26/2020   Acute respiratory failure with hypoxia ([place]) 10/27/2019   Sepsis ([place]) 10/21/2019   Generalized anxiety disorder 09/01/2019   Primary insomnia 09/01/2019   GERD without esophagitis 07/23/2019   Meningioma ([place]) 07/23/2019   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   Ulcer of left foot, limited to breakdown of skin ([place]) 02/07/2018   Chronic pain of both shoulders 08/09/2017   Pain in right wrist 08/09/2017   Atherosclerosis of renal artery ([place]) 04/24/2017   Type  2 diabetes mellitus without complications ([place]) 44/09/270   Chronic left-sided low back pain with left-sided sciatica 07/24/2016   Lower extremity edema 01/28/2015   Angina pectoris ([place]) 07/08/2014   Anemia, unspecified 05/06/2014   Acute gastric ulcer 08/27/2013   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   IDA (iron deficiency anemia) 08/25/2013   Mixed hyperlipidemia 08/13/2013   Essential hypertension, benign 08/13/2013    Past Surgical History:  Procedure Laterality Date   COLONOSCOPY N/A 08/27/2013   Procedure: COLONOSCOPY;  Surgeon: Jerene Bears, MD;  Location: [facility];  Service: Gastroenterology;  Laterality: N/A;   ESOPHAGOGASTRODUODENOSCOPY N/A 08/27/2013   Procedure: ESOPHAGOGASTRODUODENOSCOPY (EGD);  Surgeon: Jerene Bears, MD;  Location: [facility];  Service: Gastroenterology;  Laterality: N/A;   FOOT AMPUTATION THROUGH METATARSAL Left    FOOT SURGERY     age 71   TOTAL HIP ARTHROPLASTY Bilateral 2002, 2003   TOTAL KNEE ARTHROPLASTY Bilateral 1999       Family History  Problem Relation Age of Onset   Cervical cancer Mother        mets   Breast cancer Sister        mets   Diabetes Sister    Diabetes Brother    Heart disease Sister    Cervical cancer Sister     Social History   Tobacco Use   Smoking status: Never   Smokeless tobacco: Never  Substance Use Topics   Alcohol use: No   Drug use: No    Home Medications Prior to Admission medications   Medication Sig Start Date End Date Taking? Authorizing Provider  ACCU-CHEK SMARTVIEW test strip USE 1 STRIP TO CHECK GLUCOSE TWICE DAILY AS DIRECTED 02/15/20   [provider]  amLODipine (NORVASC) 10 MG tablet TAKE 1 TABLET BY MOUTH  DAILY 01/05/21   Jettie Booze, MD  Cholecalciferol (VITAMIN D) 1000 UNITS capsule Take 1,000 Units by mouth 3 (three) times a week. Monday, Wednesday, Saturday    [provider]  cyclobenzaprine (FLEXERIL) 10 MG tablet Take 10 mg by mouth 3 (three) times  daily as needed for muscle spasms.  03/04/14   [provider]  diclofenac (VOLTAREN) 75 MG EC tablet Take 75 mg by mouth 2 (two) times daily as needed for pain. 04/29/17   [provider]  dicyclomine (BENTYL) 20 MG tablet Take 1 tablet by mouth daily. 07/09/16   [provider]  fexofenadine (ALLEGRA) 180 MG tablet Take 180 mg by mouth daily as needed for allergies.     [provider]  gentamicin ointment (GARAMYCIN) 0.1 % For  wound care on left foot 03/05/20   Landis Martins, DPM  GuaiFENesin (MUCINEX PO) Take 1 tablet by mouth daily as needed (to loosen phlegm).    [provider]  insulin glargine (LANTUS) 100 units/mL SOLN Inject 10 Units into the skin at bedtime.     [provider]  iron polysaccharides (NIFEREX) 150 MG capsule Take by mouth. 11/15/19   [provider]  isosorbide mononitrate (IMDUR) 60 MG 24 hr tablet Take 1 tablet (60 mg total) by mouth daily. 08/25/20   Jettie Booze, MD  LORazepam (ATIVAN) 1 MG tablet Take 1 mg by mouth daily as needed. 01/22/20   [provider]  LORazepam (ATIVAN) 2 MG tablet Take 2 mg by mouth at bedtime. Take one half tablet by mouth in the morning and one half tablet by mouth in the evening     [provider]  metFORMIN (GLUCOPHAGE) 1000 MG tablet Take 1,000 mg by mouth 2 (two) times daily with a meal.    [provider]  nitroGLYCERIN (NITROSTAT) 0.4 MG SL tablet Place 1 tablet (0.4 mg total) under the tongue every 5 (five) minutes as needed for chest pain. Please keep upcoming appt in May with Dr. Irish Lack. Thank you 01/09/20   Jettie Booze, MD  oxybutynin (DITROPAN-XL) 5 MG 24 hr tablet Take 5 mg by mouth daily. 12/16/19   [provider]  pantoprazole (PROTONIX) 40 MG tablet Take 1 tablet (40 mg total) by mouth daily. 12/27/17   Pyrtle, Lajuan Lines, MD  pioglitazone (ACTOS) 30 MG tablet Take 30 mg by mouth daily.    [provider]   polyethylene glycol (MIRALAX / GLYCOLAX) packet Take 17 g by mouth 2 (two) times daily as needed (constipation).     [provider]  pravastatin (PRAVACHOL) 40 MG tablet Take 40 mg by mouth daily. 12/25/19   [provider]  predniSONE (DELTASONE) 10 MG tablet Take 2 tablets (20 mg total) by mouth daily with breakfast. 08/09/17   [name], MD  rosuvastatin (CRESTOR) 10 MG tablet Take 10 mg by mouth at bedtime.     [provider]  sulfamethoxazole-trimethoprim (BACTRIM) 400-80 MG tablet Take 1 tablet by mouth 2 (two) times daily. 03/02/20   Landis Martins, DPM  torsemide (DEMADEX) 20 MG tablet Take  by mouth. 01/28/20 02/27/20  [provider]  traMADol Veatrice Bourbon) 50 MG tablet Take 1-2 tablet up to 3 times daly as needed for pain 03/30/14   Pyrtle, Lajuan Lines, MD    Allergies    Lipitor [atorvastatin]  Review of Systems   Review of Systems  Constitutional:  Negative for chills and fever.  HENT:  Negative for ear pain and sore throat.   Eyes:  Negative for pain and visual disturbance.  Respiratory:  Negative for cough and shortness of breath.   Cardiovascular:  Negative for chest pain and palpitations.  Gastrointestinal:  Negative for abdominal pain and vomiting.  Genitourinary:  Negative for dysuria and hematuria.  Musculoskeletal:  Negative for arthralgias and back pain.  Skin:  Positive for color change, rash and wound.  Neurological:  Negative for seizures and syncope.  All other systems reviewed and are negative.  Physical Exam Updated Vital Signs BP 132/68   Pulse 81   Temp 98.6 F (37 C) (Oral)   Resp 18   Ht 5\' 8"  (1.727 m)   Wt 83.9 kg   SpO2 96%   BMI 28.13 kg/m   Physical Exam Vitals and nursing note reviewed.  Constitutional:      Appearance: He is well-developed.  HENT:     Head: Normocephalic and atraumatic.  Eyes:     Conjunctiva/sclera: Conjunctivae normal.  Cardiovascular:     Rate and Rhythm: Normal rate and regular  rhythm.     Heart sounds: No murmur heard. Pulmonary:     Effort: Pulmonary effort is normal. No respiratory distress.     Breath sounds: Normal breath sounds.  Abdominal:     Palpations: Abdomen is soft.     Tenderness: There is no abdominal tenderness.  Musculoskeletal:     Cervical back: Neck supple.     Comments: Right lower extremity: There is generalized erythema over the entirety of his lower leg and foot, chronic appearing wound across palmar aspect of foot  Skin:    General: Skin is warm and dry.     Comments: Rash as above  Neurological:     General: No focal deficit present.     Mental Status: He is alert.  Psychiatric:        Mood and Affect: Mood normal.   Media Information         Document Information  Photos  Legs  03/11/2021 22:12  Attached To:  Hospital Encounter on 03/11/21   Source Information  Lucrezia Starch, MD  [facility]    Media Information         Document Information  Photos  Right foot  03/11/2021 22:12  Attached To:  Hospital Encounter on 03/11/21   Source Information  Damond Borchers, Ellwood Dense, MD  [facility]    ED Results / Procedures / Treatments   Labs (all labs ordered are listed, but only abnormal results are displayed) Labs Reviewed  CBC WITH DIFFERENTIAL/PLATELET - Abnormal; Notable for the following components:      Result Value   Hemoglobin 12.6 (*)    HCT 38.5 (*)    Monocytes Absolute 1.1 (*)    All other components within normal limits  BASIC METABOLIC PANEL - Abnormal; Notable for the following components:   Sodium 132 (*)    Chloride 95 (*)    Glucose, Bld 315 (*)    BUN 50 (*)    Creatinine, Ser 1.87 (*)    Calcium 8.6 (*)    GFR, Estimated  37 (*)    All other components within normal limits  C-REACTIVE PROTEIN - Abnormal; Notable for the following components:   CRP 16.9 (*)    All other components within normal limits  CULTURE, BLOOD (ROUTINE X 2)  CULTURE, BLOOD (ROUTINE X 2)   SARS CORONAVIRUS 2 (TAT 6-24 HRS)  LACTIC ACID, PLASMA  LACTIC ACID, PLASMA  SEDIMENTATION RATE    EKG None  Radiology DG Foot Complete Right  Result Date: 03/11/2021 CLINICAL DATA:  Right heel pain and possible infection. Worsening heel ulcer. EXAM: RIGHT FOOT COMPLETE - 3+ VIEW COMPARISON:  Foot radiograph 10/23/2019 FINDINGS: Chronic deformity of the distal metatarsals and digits, presumably postsurgical. K-wires within the first proximal phalanx. Similar appearance to prior exam. Moderate midfoot degenerative change. No visualized fracture. No evidence of erosion, focal bone abnormality or bone destruction. Chronic Achilles tendon enthesophyte. Dorsal soft tissue edema. Known heel ulcer is not well seen by radiograph. There is no soft tissue air or radiopaque foreign body. Vascular calcifications are seen. IMPRESSION: 1. No radiographic findings of osteomyelitis or acute osseous abnormality. 2. Dorsal soft tissue edema. No soft tissue air or radiopaque foreign body. 3. Chronic deformities of the distal metatarsals and digits, presumably postsurgical. Moderate midfoot degenerative change. Electronically Signed   By: Keith Rake M.D.   On: 03/11/2021 22:02    Procedures Procedures   Medications Ordered in ED Medications  vancomycin (VANCOCIN) IVPB 1000 mg/200 mL premix (1,000 mg Intravenous New Bag/Given 03/12/21 0025)  piperacillin-tazobactam (ZOSYN) IVPB 3.375 g (0 g Intravenous Stopped 03/12/21 0026)    ED Course  I have reviewed the triage vital signs and the nursing notes.  Pertinent labs & imaging results that were available during my care of the patient were reviewed by me and considered in my medical decision making (see chart for details).    MDM Rules/Calculators/A&P                          80 year old male presents to ER with concern for redness over his right leg.  Patient has a fairly impressive amount of erythema over entirety of foot and lower leg.  Wound over the  base of the foot appears more chronic in nature but has quite significant overlying cellulitis.  Discussed with on-call for due to, Dr. Ninfa Linden.  He recommends course of IV antibiotics, admission to the medicine service.  Does not need any emergent Ortho intervention at present but would recommend admission to [facility] in case he ultimately does require any procedures by Dr. Sharol Given.  He will consult on patient tomorrow.  Consulted medicine for admission.  Final Clinical Impression(s) / ED Diagnoses Final diagnoses:  Cellulitis, unspecified cellulitis site  Wound infection    Rx / DC Orders ED Discharge Orders     None        Lucrezia Starch, MD 03/12/21 0028

## 2021-03-12 NOTE — Care Plan (Signed)
This 80 years old male with PMH significant for type 2 diabetes with peripheral neuropathy, history of transmetatarsal amputation of left foot, hypertension, CAD, CKD stage IIIa, GERD and hyperlipidemia presents in the ED with right foot wound.  Patient reports redness and swelling of right leg about a week ago and then he fall due to unsteadiness.  Patient was noted to have a large wound on the bottom of right foot.  Daughter thinks he has that wound for a while since he has neuropathy.  Patient is started on vancomycin and Zosyn,  Podiatry was consulted Dr. Ninfa Linden recommended admission to [facility] for possible intervention.  Patient was seen and examined.

## 2021-03-12 NOTE — Progress Notes (Signed)
Pt arrived to room 6N06 via [facility] from [facility] ED. Received report from [place], [place]. See assessment. Will continue to monitor.

## 2021-03-13 ENCOUNTER — Encounter (HOSPITAL_COMMUNITY): Payer: Medicare Other

## 2021-03-13 ENCOUNTER — Inpatient Hospital Stay (HOSPITAL_COMMUNITY): Payer: Medicare Other

## 2021-03-13 LAB — GLUCOSE, CAPILLARY
Glucose-Capillary: 101 mg/dL — ABNORMAL HIGH (ref 70–99)
Glucose-Capillary: 146 mg/dL — ABNORMAL HIGH (ref 70–99)
Glucose-Capillary: 193 mg/dL — ABNORMAL HIGH (ref 70–99)
Glucose-Capillary: 264 mg/dL — ABNORMAL HIGH (ref 70–99)
Glucose-Capillary: 70 mg/dL (ref 70–99)
Glucose-Capillary: 82 mg/dL (ref 70–99)

## 2021-03-13 LAB — CBC
HCT: 36.1 % — ABNORMAL LOW (ref 39.0–52.0)
Hemoglobin: 11.6 g/dL — ABNORMAL LOW (ref 13.0–17.0)
MCH: 28.1 pg (ref 26.0–34.0)
MCHC: 32.1 g/dL (ref 30.0–36.0)
MCV: 87.4 fL (ref 80.0–100.0)
Platelets: 281 10*3/uL (ref 150–400)
RBC: 4.13 MIL/uL — ABNORMAL LOW (ref 4.22–5.81)
RDW: 14.7 % (ref 11.5–15.5)
WBC: 6.8 10*3/uL (ref 4.0–10.5)
nRBC: 0 % (ref 0.0–0.2)

## 2021-03-13 LAB — COMPREHENSIVE METABOLIC PANEL
ALT: 27 U/L (ref 0–44)
AST: 25 U/L (ref 15–41)
Albumin: 2.6 g/dL — ABNORMAL LOW (ref 3.5–5.0)
Alkaline Phosphatase: 167 U/L — ABNORMAL HIGH (ref 38–126)
Anion gap: 10 (ref 5–15)
BUN: 26 mg/dL — ABNORMAL HIGH (ref 8–23)
CO2: 29 mmol/L (ref 22–32)
Calcium: 8.5 mg/dL — ABNORMAL LOW (ref 8.9–10.3)
Chloride: 99 mmol/L (ref 98–111)
Creatinine, Ser: 1.38 mg/dL — ABNORMAL HIGH (ref 0.61–1.24)
GFR, Estimated: 53 mL/min — ABNORMAL LOW (ref >60)
Glucose, Bld: 101 mg/dL — ABNORMAL HIGH (ref 70–99)
Potassium: 3.6 mmol/L (ref 3.5–5.1)
Sodium: 138 mmol/L (ref 135–145)
Total Bilirubin: 1 mg/dL (ref 0.3–1.2)
Total Protein: 6 g/dL — ABNORMAL LOW (ref 6.5–8.1)

## 2021-03-13 LAB — MAGNESIUM: Magnesium: 1.7 mg/dL (ref 1.7–2.4)

## 2021-03-13 LAB — PHOSPHORUS: Phosphorus: 3.3 mg/dL (ref 2.5–4.6)

## 2021-03-13 MED ORDER — LORAZEPAM 1 MG PO TABS
1.0000 mg | ORAL_TABLET | Freq: Once | ORAL | Status: AC
  Administered 2021-03-13: 1 mg via ORAL
  Filled 2021-03-13: qty 1

## 2021-03-13 MED ORDER — ISOSORBIDE MONONITRATE ER 60 MG PO TB24
60.0000 mg | ORAL_TABLET | Freq: Every day | ORAL | Status: DC
  Administered 2021-03-13 – 2021-03-16 (×4): 60 mg via ORAL
  Filled 2021-03-13 (×4): qty 1

## 2021-03-13 MED ORDER — LORAZEPAM 1 MG PO TABS
1.0000 mg | ORAL_TABLET | Freq: Every day | ORAL | Status: DC | PRN
  Administered 2021-03-14 – 2021-03-15 (×3): 1 mg via ORAL
  Filled 2021-03-13 (×3): qty 1

## 2021-03-13 MED ORDER — ESCITALOPRAM OXALATE 10 MG PO TABS
5.0000 mg | ORAL_TABLET | Freq: Every day | ORAL | Status: DC
  Administered 2021-03-13 – 2021-03-16 (×4): 5 mg via ORAL
  Filled 2021-03-13 (×4): qty 1

## 2021-03-13 MED ORDER — LACTATED RINGERS IV SOLN: INTRAVENOUS | Status: DC

## 2021-03-13 MED ORDER — POLYETHYLENE GLYCOL 3350 17 G PO PACK: 17.0000 g | PACK | Freq: Every day | ORAL | Status: DC | PRN

## 2021-03-13 MED ORDER — ACETAMINOPHEN 650 MG RE SUPP: 650.0000 mg | Freq: Four times a day (QID) | RECTAL | Status: DC | PRN

## 2021-03-13 MED ORDER — TAMSULOSIN HCL 0.4 MG PO CAPS
0.4000 mg | ORAL_CAPSULE | Freq: Every day | ORAL | Status: DC
  Administered 2021-03-13 – 2021-03-16 (×4): 0.4 mg via ORAL
  Filled 2021-03-13 (×4): qty 1

## 2021-03-13 MED ORDER — ONDANSETRON HCL 4 MG PO TABS: 4.0000 mg | ORAL_TABLET | Freq: Four times a day (QID) | ORAL | Status: DC | PRN

## 2021-03-13 MED ORDER — ONDANSETRON HCL 4 MG/2ML IJ SOLN: 4.0000 mg | Freq: Four times a day (QID) | INTRAMUSCULAR | Status: DC | PRN

## 2021-03-13 MED ORDER — VANCOMYCIN HCL 1250 MG/250ML IV SOLN
1250.0000 mg | INTRAVENOUS | Status: DC
  Filled 2021-03-13: qty 250

## 2021-03-13 MED ORDER — TIZANIDINE HCL 2 MG PO TABS
4.0000 mg | ORAL_TABLET | Freq: Every day | ORAL | Status: DC
  Administered 2021-03-13 – 2021-03-15 (×3): 4 mg via ORAL
  Filled 2021-03-13 (×3): qty 2

## 2021-03-13 MED ORDER — ACETAMINOPHEN 325 MG PO TABS: 650.0000 mg | ORAL_TABLET | Freq: Four times a day (QID) | ORAL | Status: DC | PRN

## 2021-03-13 MED ORDER — DOCUSATE SODIUM 100 MG PO CAPS
100.0000 mg | ORAL_CAPSULE | Freq: Two times a day (BID) | ORAL | Status: DC
  Administered 2021-03-13 – 2021-03-16 (×6): 100 mg via ORAL
  Filled 2021-03-13 (×6): qty 1

## 2021-03-13 NOTE — Progress Notes (Signed)
[facility] Progress Note  Patient: Christian [name]    BMW:[id number]  DOA: 03/11/2021     Date of Service: the patient was seen and examined on 03/13/2021  Brief hospital course: Past medical history of type II DM, neuropathy, PAD, transmetatarsal amputation of left foot, HTN, CAD, CKD 3A, GERD, HLD.  Presents with complaints of worsening right foot wound. Appears to have cellulitis without any evidence of osteomyelitis or abscess that required surgical intervention. Currently plan is continue IV antibiotics.  Subjective: Pain and swelling is improving.  No nausea no vomiting.  No fever no chills.  No chest pain abdominal pain. Interview was done with ASL interpreter.  Assessment and Plan: 1.  Right foot cellulitis Currently on IV vancomycin and Zosyn. X-ray negative for osteomyelitis.  MRI negative for osteomyelitis as well as abscess. Orthopedic was consulted. Currently no indication for surgical intervention.  Monitor.  2.  Type II DM with hyperglycemia without long-term insulin use. Currently on sliding scale insulin.  Monitor.  3.  Acute kidney injury on CKD 3A. Baseline serum creatinine 1.5. On presentation of 1.87. Treated with IV fluids. Monitor while the patient is receiving vancomycin.  4.  HTN Blood pressure stable. Will continue current regimen.  Scheduled Meds:  docusate sodium  100 mg Oral BID   escitalopram  5 mg Oral Daily   insulin aspart  0-9 Units Subcutaneous Q4H   isosorbide mononitrate  60 mg Oral Daily   tamsulosin  0.4 mg Oral QPC supper   tiZANidine  4 mg Oral QHS   Continuous Infusions:  piperacillin-tazobactam (ZOSYN)  IV 3.375 g (03/13/21 1831)   [START ON 03/14/2021] vancomycin     PRN Meds: acetaminophen **OR** acetaminophen, LORazepam, ondansetron **OR** ondansetron (ZOFRAN) IV, oxyCODONE, polyethylene glycol  Body mass index is 28.13 kg/m.        DVT Prophylaxis:   SCDs Start: 03/12/21 0044    Advance goals of care discussion:  Pt is Full code.  Family Communication: no family was present at bedside, at the time of interview.   Data Reviewed: I have personally reviewed and interpreted daily labs, tele strips, imaging. Renal function improving from 1.8 to 1.3.  LFTs stable.  Hemoglobin stable.  No leukocytosis.  Physical Exam:  General: Appear in mild distress, no Rash; Oral Mucosa Clear, moist. no Abnormal Neck Mass Or lumps, Conjunctiva normal  Cardiovascular: S1 and S2 Present, no Murmur, Respiratory: good respiratory effort, Bilateral Air entry present and CTA, no Crackles, no wheezes Abdomen: Bowel Sound present, Soft and no tenderness Extremities: Improving pedal edema Neurology: alert and oriented to time, place, and person affect appropriate. no new focal deficit Gait not checked due to patient safety concerns  Vitals:   03/12/21 2034 03/13/21 0449 03/13/21 0953 03/13/21 1500  BP: 122/64 128/60 136/73 127/73  Pulse: 72 78 75 74  Resp: 18 17  18   Temp: 98.2 F (36.8 C) 98 F (36.7 C)  98.7 F (37.1 C)  TempSrc: Oral Oral  Oral  SpO2: 97% 99% 94% 100%  Weight:      Height:        Disposition:  Status is: Inpatient  Remains inpatient appropriate because:Inpatient level of care appropriate due to severity of illness  Dispo: The patient is from: Home              Anticipated d/c is to: Home              Patient currently is not medically stable to d/c.  Difficult to place patient No        Time spent: 35 minutes. I reviewed all nursing notes, pharmacy notes, vitals, pertinent old records. I have discussed plan of care as described above with RN.  Author: Berle Mull, MD [facility] 03/13/2021 7:54 PM  To reach On-call, see care teams to locate the attending and reach out via [email or web address]. Between 7PM-7AM, please contact night-coverage If you still have difficulty reaching the attending provider, please page the [facility] (Director on Call) for [facility] on amion for  assistance.

## 2021-03-13 NOTE — Progress Notes (Signed)
I did examined Christian [name] at the bedside and assessed his right leg and foot.  There is no area of fluctuance and the cellulitis seems to be dissipating.  I did review the MRI again of his right foot.  There is no indication for surgical intervention.  As this continues to resolve, he should be able to be discharged on oral antibiotics such as doxycycline twice a day for 2 weeks.  A follow-up should be established with Dr. Sharol Given in 1 to 2 weeks.

## 2021-03-13 NOTE — Progress Notes (Addendum)
PHARMACY NOTE:  ANTIMICROBIAL RENAL DOSAGE ADJUSTMENT  Current antimicrobial regimen includes a mismatch between antimicrobial dosage and estimated renal function.  As per policy approved by the Pharmacy & Therapeutics and Medical Executive Committees, the antimicrobial dosage will be adjusted accordingly.  Current antimicrobial dosage:  Vancomycin 1500 MG Q36H  Indication: Cellulitis  Renal Function:  Estimated Creatinine Clearance: 47.3 mL/min (A) (by C-G formula based on SCr of 1.38 mg/dL (H)). []      On intermittent HD, scheduled: []      On CRRT    Antimicrobial dosage has been changed to:  Vancomycin 1250 MG Q24H (AUC 512, Scr 1.38)   Additional comments: Patients renal function has improved from 1.87 to 1.38. Continue to monitor clinical status and renal function.    Thank you for allowing pharmacy to be a part of this patient's care.  Cephus Slater, PharmD, MBA Pharmacy Resident [phone] 03/13/2021 8:04 AM

## 2021-03-14 ENCOUNTER — Encounter (HOSPITAL_COMMUNITY): Payer: Medicare Other

## 2021-03-14 DIAGNOSIS — M01X71 Direct infection of right ankle and foot in infectious and parasitic diseases classified elsewhere: Secondary | ICD-10-CM | POA: Diagnosis not present

## 2021-03-14 LAB — BASIC METABOLIC PANEL
Anion gap: 6 (ref 5–15)
BUN: 22 mg/dL (ref 8–23)
CO2: 29 mmol/L (ref 22–32)
Calcium: 8.3 mg/dL — ABNORMAL LOW (ref 8.9–10.3)
Chloride: 101 mmol/L (ref 98–111)
Creatinine, Ser: 1.34 mg/dL — ABNORMAL HIGH (ref 0.61–1.24)
GFR, Estimated: 55 mL/min — ABNORMAL LOW (ref >60)
Glucose, Bld: 104 mg/dL — ABNORMAL HIGH (ref 70–99)
Potassium: 3.7 mmol/L (ref 3.5–5.1)
Sodium: 136 mmol/L (ref 135–145)

## 2021-03-14 LAB — GLUCOSE, CAPILLARY
Glucose-Capillary: 102 mg/dL — ABNORMAL HIGH (ref 70–99)
Glucose-Capillary: 145 mg/dL — ABNORMAL HIGH (ref 70–99)
Glucose-Capillary: 171 mg/dL — ABNORMAL HIGH (ref 70–99)
Glucose-Capillary: 366 mg/dL — ABNORMAL HIGH (ref 70–99)
Glucose-Capillary: 425 mg/dL — ABNORMAL HIGH (ref 70–99)
Glucose-Capillary: 91 mg/dL (ref 70–99)
Glucose-Capillary: 95 mg/dL (ref 70–99)

## 2021-03-14 MED ORDER — VANCOMYCIN HCL 1250 MG/250ML IV SOLN
1250.0000 mg | INTRAVENOUS | Status: DC
  Administered 2021-03-14: 1250 mg via INTRAVENOUS
  Filled 2021-03-14: qty 250

## 2021-03-14 MED ORDER — INSULIN ASPART 100 UNIT/ML IJ SOLN: 0.0000 [IU] | Freq: Every day | INTRAMUSCULAR | Status: DC

## 2021-03-14 MED ORDER — INSULIN ASPART 100 UNIT/ML IJ SOLN
6.0000 [IU] | Freq: Once | INTRAMUSCULAR | Status: AC
  Administered 2021-03-14: 6 [IU] via SUBCUTANEOUS

## 2021-03-14 MED ORDER — INSULIN ASPART 100 UNIT/ML IJ SOLN
0.0000 [IU] | Freq: Three times a day (TID) | INTRAMUSCULAR | Status: DC
  Administered 2021-03-15 (×2): 2 [IU] via SUBCUTANEOUS
  Administered 2021-03-15: 1 [IU] via SUBCUTANEOUS
  Administered 2021-03-16: 3 [IU] via SUBCUTANEOUS
  Administered 2021-03-16: 1 [IU] via SUBCUTANEOUS
  Administered 2021-03-16: 2 [IU] via SUBCUTANEOUS

## 2021-03-14 NOTE — Progress Notes (Signed)
Orthopedic Tech Progress Note Patient Details:  Christian [name] 1941/05/05 [id number]   I signed the [employer] DME sheet on the patient's behalf as he was unable to physically sign it himself. Patient understood what I was signing for.  Ortho Devices Type of Ortho Device: Postop shoe/boot Ortho Device/Splint Location: RLE Ortho Device/Splint Interventions: Ordered, Application, Adjustment   Post Interventions Patient Tolerated: Well, Ambulated well Instructions Provided: Care of device, Adjustment of device  Christian [name] Jeri Modena 03/14/2021, 3:13 PM

## 2021-03-14 NOTE — Progress Notes (Addendum)
Physical Therapy Evaluation Patient Details Name: Christian [name] MRN: [id number] DOB: November 05, 1940 Today's Date: 03/14/2021   History of Present Illness  Pt admit 7/1 presenting with complaints of worsening right foot wound.  Appears to have cellulitis without any evidence of osteomyelitis or abscess that required surgical intervention. Past medical history of type II DM, neuropathy, PAD, transmetatarsal amputation of left foot, HTN, CAD, CKD 3A, GERD, HLD.  Clinical Impression  Pt admitted with above diagnosis. Pt was able to ambulate but only went a short distance as noted some blood on pts' right foot and pt had to have BM therefore assisted pt to 3N1 and pt did use the potty.  Nurse and MD made aware of bleeding on foot as well as asked if pt has any weight bearing restrictions given the bleeding.  MD stated that Ortho MD states pt can be WBAT right foot and wants him to have bil cast shoes. MD ordered.   Pt currently with functional limitations due to the deficits listed below (see PT Problem List). Pt will benefit from skilled PT to increase their independence and safety with mobility to allow discharge to the venue listed below.       Follow Up Recommendations Home health PT;Supervision/Assistance - 24 hour (if pt has 24 hour assist, if not may need short term SNF)    Equipment Recommendations  None recommended by PT    Recommendations for Other Services       Precautions / Restrictions Precautions Precautions: Fall Restrictions Weight Bearing Restrictions: No (No weight bearing restrictions on chart however therefore messaged the MD to ask if there are restrictions and to let them know that pt with active bleeding right foot with weight bearing.) - MD notified PT that pt can be WBAT right foot with bil cast shoes.       Mobility  Bed Mobility Overal bed mobility: Needs Assistance Bed Mobility: Supine to Sit     Supine to sit: Min guard     General bed mobility comments:  Cleaned pt and changed linens as the Primo fit had leaked and bed and gown wet.  Incr time to come to EOB but did not need assist    Transfers Overall transfer level: Needs assistance Equipment used: Rolling walker (2 wheeled) Transfers: Sit to/from Stand Sit to Stand: Min assist;+2 safety/equipment;From elevated surface         General transfer comment: Pt was able to stand to RW with min assist and cues with steadying assist needed upon standing.  Took a little time for pt to get balance initially but subsequent stand pt did not need assist for steadying.  Ambulation/Gait Ambulation/Gait assistance: Min guard Gait Distance (Feet): 8 Feet Assistive device: Rolling walker (2 wheeled) Gait Pattern/deviations: Step-through pattern;Decreased stride length;Decreased stance time - right;Decreased weight shift to right;Decreased dorsiflexion - right;Antalgic;Wide base of support   Gait velocity interpretation: <1.31 ft/sec, indicative of household ambulator General Gait Details: Pt was able to walk with RW around bed and once he got around bed told PT he needed to have BM.  Was going to have pt turn around and walk to bathroom however noted right sock had some blood on it therefore brought 3N1 to pt.Once pt had BM, was able to stand and be cleaned by PT and brought chair behind pt.  Messaged MD regarding blood on pts right foot to ask if weight bearing should be limited.  Stairs            Wheelchair  Mobility    Modified Rankin (Stroke Patients Only)       Balance Overall balance assessment: Needs assistance Sitting-balance support: No upper extremity supported;Feet supported Sitting balance-[name] Scale: Fair     Standing balance support: Bilateral upper extremity supported;During functional activity Standing balance-[name] Scale: Poor Standing balance comment: pt was able to stand with bil UE support and RW                             Pertinent Vitals/Pain Pain  Assessment: No/denies pain    Home Living Family/patient expects to be discharged to:: Private residence Living Arrangements: Other relatives (grandchildren - 1 works days and 1 works nights) Available Help at Discharge: Family;Available 24 hours/day Type of Home: House Home Access: Stairs to enter   [employer] of Steps: 3 Home Layout: One level Home Equipment: Walker - 2 wheels;Walker - 4 wheels;Bedside commode;Tub bench;Wheelchair - manual Additional Comments: Reports he walks better with shoes on    Prior Function Level of Independence: Independent with assistive device(s)         Comments: Stated he used rollator at all times PTA     Hand Dominance        Extremity/Trunk Assessment   Upper Extremity Assessment Upper Extremity Assessment: Defer to OT evaluation    Lower Extremity Assessment Lower Extremity Assessment: RLE deficits/detail;LLE deficits/detail RLE Deficits / Details: ankle 3/5, knee 3+/5, hip 3+/5 LLE Deficits / Details: transmet amputation with ankle 3-/5, knee 3+/5 hip 3+/5    Cervical / Trunk Assessment Cervical / Trunk Assessment: Kyphotic  Communication   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  Notified nurse and MD.    Exercises     Assessment/Plan    PT Assessment Patient needs continued PT services  PT Problem List Decreased balance;Decreased activity tolerance;Decreased mobility;Decreased knowledge of use of DME;Decreased safety awareness;Decreased knowledge of precautions       PT Treatment Interventions DME instruction;Gait  training;Functional mobility training;Therapeutic activities;Therapeutic exercise;Balance training;Patient/family education;Stair training    PT Goals (Current goals can be found in the Care Plan section)  Acute Rehab PT Goals Patient Stated Goal: to go home PT Goal Formulation: With patient Time For Goal Achievement: 03/28/21 Potential to Achieve Goals: Fair    Frequency Min 3X/week   Barriers to discharge        Co-evaluation               AM-PAC PT "6 Clicks" Mobility  Outcome Measure Help needed turning from your back to your side while in a flat bed without using bedrails?: A Little Help needed moving from lying on your back to sitting on the side of a flat bed without using bedrails?: A Little Help needed moving to and from a bed to a chair (including a wheelchair)?: Total Help needed standing up from a chair using your arms (e.g., wheelchair or bedside chair)?: Total Help needed to walk in hospital room?: Total Help needed climbing 3-5 steps  with a railing? : Total 6 Click Score: 10    End of Session Equipment Utilized During Treatment: Gait belt Activity Tolerance: Patient limited by fatigue Patient left: in chair;with call bell/phone within reach;with chair alarm set Nurse Communication: Mobility status PT Visit Diagnosis: Muscle weakness (generalized) (M62.81)    Time: 1007-1040 PT Time Calculation (min) (ACUTE ONLY): 33 min   Charges:   PT Evaluation $PT Eval Moderate Complexity: 1 Mod PT Treatments $Gait Training: 8-22 mins        Simonne Boulos M,PT Acute Rehab Services [phone] (pager)   Alvira Philips 03/14/2021, 1:04 PM

## 2021-03-14 NOTE — Evaluation (Signed)
Occupational Therapy Evaluation Patient Details Name: Christian [name] MRN: [id number] DOB: 09-Jan-1941 Today's Date: 03/14/2021    History of Present Illness Pt admit 7/1 presenting with complaints of worsening right foot wound.  Appears to have cellulitis without any evidence of osteomyelitis or abscess that required surgical intervention. Past medical history of type II DM, neuropathy, PAD, transmetatarsal amputation of left foot, HTN, CAD, CKD 3A, GERD, HLD.   Clinical Impression   PT admitted with cellulitis R foot. Pt currently with functional limitiations due to the deficits listed below (see OT problem list). Pt currently demonstrates a foot drop on the right LE and risk for falls. Called and spoke with daughter Biagio Quint regarding need to wear shoe and use of urinal at night to decrease fall risk.  Pt will benefit from skilled OT to increase their independence and safety with adls and balance to allow discharge [place].     Follow Up Recommendations  Home health OT    Equipment Recommendations  None recommended by OT (has DME per pt report)    Recommendations for Other Services       Precautions / Restrictions Precautions Precautions: Fall Required Braces or Orthoses: Other Brace Restrictions Weight Bearing Restrictions: Yes RLE Weight Bearing: Weight bearing as tolerated Other Position/Activity Restrictions: post op shoe with heel support (black)      Mobility Bed Mobility               General bed mobility comments: oob in chair    Transfers Overall transfer level: Needs assistance Equipment used: Rolling walker (2 wheeled) Transfers: Sit to/from Stand Sit to Stand: Min guard         General transfer comment: pt powered up from chair with flexed posture and increased time    Balance Overall balance assessment: Needs assistance Sitting-balance support: No upper extremity supported;Feet supported Sitting balance-[name] Scale: Fair     Standing  balance support: Bilateral upper extremity supported;During functional activity Standing balance-[name] Scale: Poor Standing balance comment: pt was able to stand with bil UE support and RW                           ADL either performed or assessed with clinical judgement   ADL Overall ADL's : Needs assistance/impaired Eating/Feeding: Set up;Sitting   Grooming: Wash/dry hands;Supervision/safety;Sitting               Lower Body Dressing: Total assistance Lower Body Dressing Details (indicate cue type and reason): pt unable to complete. will require family. pt provided demo of don and doff pt able to verbalize back Toilet Transfer: Min guard;RW           Functional mobility during ADLs: Rolling walker;Min guard       Vision Baseline Vision/History: Wears glasses Wears Glasses: At all times       Perception     Praxis      Pertinent Vitals/Pain Pain Assessment: No/denies pain     Hand Dominance Right   Extremity/Trunk Assessment Upper Extremity Assessment Upper Extremity Assessment: RUE deficits/detail;LUE deficits/detail RUE Deficits / Details: contractures from arthritis LUE Deficits / Details: contractures from arthritis   Lower Extremity Assessment Lower Extremity Assessment: RLE deficits/detail RLE Deficits / Details: pt is unable to perform ankle flexion demonstrates drop foot LLE Deficits / Details: transmet amputation with ankle 3-/5, knee 3+/5 hip 3+/5   Cervical / Trunk Assessment Cervical / Trunk Assessment: Kyphotic   Communication Communication Communication: Deaf (pt  reads lips and also has paper to use written communication; signs as well)   Cognition Arousal/Alertness: Awake/alert Behavior During Therapy: WFL for tasks assessed/performed Overall Cognitive Status: Within Functional Limits for tasks assessed                                     General Comments       Exercises     Shoulder Instructions       Home Living Family/patient expects to be discharged to:: Private residence Living Arrangements: Other relatives (grandchildren - 1 works days and 1 works nights) Available Help at Discharge: Family;Available 24 hours/day Type of Home: House Home Access: Stairs to enter [employer] of Steps: 3   Home Layout: One level     Bathroom Shower/Tub: Teacher, early years/pre: Standard     Home Equipment: Environmental consultant - 2 wheels;Walker - 4 wheels;Bedside commode;Tub bench;Wheelchair - manual   Additional Comments: Reports he walks better with shoes on      Prior Functioning/Environment Level of Independence: Independent with assistive device(s)        Comments: Stated he used rollator at all times PTA. PT has x4 children that are helping him with care. Called daughter and reports that all 4 kids rotate (A) to ensure he has what he needs        OT Problem List: Decreased knowledge of use of DME or AE;Decreased knowledge of precautions;Decreased activity tolerance;Impaired balance (sitting and/or standing)      OT Treatment/Interventions: Self-care/ADL training;Therapeutic exercise;Energy conservation;DME and/or AE instruction;Therapeutic activities;Patient/family education;Balance training    OT Goals(Current goals can be found in the care plan section) Acute Rehab OT Goals Patient Stated Goal: to go home OT Goal Formulation: With patient Time For Goal Achievement: 03/28/21 Potential to Achieve Goals: Good  OT Frequency: Min 2X/week   Barriers to D/C:            Co-evaluation              AM-PAC OT "6 Clicks" Daily Activity     Outcome Measure Help from another person eating meals?: A Little Help from another person taking care of personal grooming?: A Little Help from another person toileting, which includes using toliet, bedpan, or urinal?: A Little Help from another person bathing (including washing, rinsing, drying)?: A Lot Help from another  person to put on and taking off regular upper body clothing?: A Little Help from another person to put on and taking off regular lower body clothing?: A Lot 6 Click Score: 16   End of Session Equipment Utilized During Treatment: Rolling walker Nurse Communication: Mobility status;Precautions  Activity Tolerance: Patient tolerated treatment well Patient left: in chair;with call bell/phone within reach;with chair alarm set  OT Visit Diagnosis: Unsteadiness on feet (R26.81);Muscle weakness (generalized) (M62.81)                Time: 9518-8416 OT Time Calculation (min): 22 min Charges:  OT General Charges $OT Visit: 1 Visit OT Evaluation $OT Eval Moderate Complexity: 1 Mod   Brynn, OTR/L  Acute Rehabilitation Services Pager: [phone] Office: [phone] .   Jeri Modena 03/14/2021, 3:50 PM

## 2021-03-14 NOTE — Progress Notes (Signed)
[facility] Progress Note  Patient: Christian [name]    VCB:[id number]  DOA: 03/11/2021     Date of Service: the patient was seen and examined on 03/14/2021  Brief hospital course: Past medical history of type II DM, neuropathy, PAD, transmetatarsal amputation of left foot, HTN, CAD, CKD 3A, GERD, HLD.  Presents with complaints of worsening right foot wound. Appears to have cellulitis without any evidence of osteomyelitis or abscess that required surgical intervention. Currently plan is continue IV antibiotics.  Subjective: Bleeding after ambulation with physical therapy currently resolved.  Has some serous discharge on the leg.  No nausea or vomiting.  No fever no chills.  Blood sugars were elevated although were checked after late lunch.  Assessment and Plan: 1.  Right foot cellulitis Currently on IV vancomycin and Zosyn. X-ray negative for osteomyelitis.  MRI negative for osteomyelitis as well as abscess. Orthopedic was consulted. Currently no indication for surgical intervention.  Monitor. Full weightbearing with postop shoes. Dr. Sharol Given Will evaluate the patient tomorrow.  2.  Type II DM with hyperglycemia without long-term insulin use. Currently on sliding scale insulin.  Monitor.  3.  Acute kidney injury on CKD 3A. Baseline serum creatinine 1.5. On presentation of 1.87. Treated with IV fluids.  Now improving.  IV fluids stopped due to concern for volume overload. Monitor while the patient is receiving vancomycin.  4.  HTN Blood pressure stable. Will continue current regimen.  5.  Volume overload versus atelectasis. Patient appears to have some cough and some shortness of breath.  Chest x-ray unremarkable. Clinically appears to be having some atelectasis. Will provide incentive spirometry.  Scheduled Meds:  docusate sodium  100 mg Oral BID   escitalopram  5 mg Oral Daily   insulin aspart  0-5 Units Subcutaneous QHS   [START ON 03/15/2021] insulin aspart  0-9 Units  Subcutaneous TID WC   insulin aspart  6 Units Subcutaneous Once   isosorbide mononitrate  60 mg Oral Daily   tamsulosin  0.4 mg Oral QPC supper   tiZANidine  4 mg Oral QHS   Continuous Infusions:  piperacillin-tazobactam (ZOSYN)  IV 3.375 g (03/14/21 1003)   vancomycin     PRN Meds: acetaminophen **OR** acetaminophen, LORazepam, ondansetron **OR** ondansetron (ZOFRAN) IV, oxyCODONE, polyethylene glycol  Body mass index is 28.13 kg/m.        DVT Prophylaxis:   SCDs Start: 03/12/21 0044    Advance goals of care discussion: Pt is Full code.  Family Communication: no family was present at bedside, at the time of interview.   Data Reviewed: I have personally reviewed and interpreted daily labs, tele strips, imaging. Renal function stable.  CBG elevated.  Physical Exam:  General: Appear in mild distress, no Rash; Oral Mucosa Clear, moist. no Abnormal Neck Mass Or lumps, Conjunctiva normal  Cardiovascular: S1 and S2 Present, no Murmur, Respiratory: good respiratory effort, Bilateral Air entry present and CTA, no Crackles, no wheezes Abdomen: Bowel Sound present, Soft and no tenderness Extremities: no Pedal edema Lower extremity ulcer actually appears to be improving with antibiotics.  Pictures in the chart. Neurology: alert and oriented to time, place, and person affect appropriate. no new focal deficit Gait not checked due to patient safety concerns   Vitals:   03/13/21 2050 03/14/21 0010 03/14/21 0942 03/14/21 1255  BP: 124/64 (!) 106/51 132/64 125/61  Pulse: 72 64 75 67  Resp: 20 20  18   Temp: 98.6 F (37 C) 98.6 F (37 C)  97.7 F (36.5  C)  TempSrc: Oral Oral  Oral  SpO2: 95% 96% 95% 95%  Weight:      Height:        Disposition:  Status is: Inpatient  Remains inpatient appropriate because:Inpatient level of care appropriate due to severity of illness  Dispo: The patient is from: Home              Anticipated d/c is to: Home              Patient currently is  not medically stable to d/c.   Difficult to place patient No        Time spent: 35 minutes. I reviewed all nursing notes, pharmacy notes, vitals, pertinent old records. I have discussed plan of care as described above with RN.  Author: Berle Mull, MD [facility] 03/14/2021 5:54 PM  To reach On-call, see care teams to locate the attending and reach out via [email or web address]. Between 7PM-7AM, please contact night-coverage If you still have difficulty reaching the attending provider, please page the [facility] (Director on Call) for [facility] on amion for assistance.

## 2021-03-14 NOTE — Progress Notes (Signed)
Transfer--front wheel walker, shoe on LLE, black post-op shoe on RLE, +1 assist, WBAT.  Pt does not want to go to rehab. His children help with his care, but he does live home alone.  Son was bedside today.

## 2021-03-15 ENCOUNTER — Inpatient Hospital Stay (HOSPITAL_COMMUNITY): Payer: Medicare Other

## 2021-03-15 DIAGNOSIS — Z794 Long term (current) use of insulin: Secondary | ICD-10-CM

## 2021-03-15 DIAGNOSIS — L8990 Pressure ulcer of unspecified site, unspecified stage: Secondary | ICD-10-CM | POA: Insufficient documentation

## 2021-03-15 DIAGNOSIS — L0390 Cellulitis, unspecified: Secondary | ICD-10-CM

## 2021-03-15 DIAGNOSIS — E119 Type 2 diabetes mellitus without complications: Secondary | ICD-10-CM

## 2021-03-15 LAB — GLUCOSE, CAPILLARY
Glucose-Capillary: 134 mg/dL — ABNORMAL HIGH (ref 70–99)
Glucose-Capillary: 167 mg/dL — ABNORMAL HIGH (ref 70–99)
Glucose-Capillary: 162 mg/dL — ABNORMAL HIGH (ref 70–99)
Glucose-Capillary: 195 mg/dL — ABNORMAL HIGH (ref 70–99)

## 2021-03-15 LAB — BASIC METABOLIC PANEL
Anion gap: 7 (ref 5–15)
BUN: 23 mg/dL (ref 8–23)
CO2: 27 mmol/L (ref 22–32)
Calcium: 8.3 mg/dL — ABNORMAL LOW (ref 8.9–10.3)
Chloride: 103 mmol/L (ref 98–111)
Creatinine, Ser: 1.34 mg/dL — ABNORMAL HIGH (ref 0.61–1.24)
GFR, Estimated: 55 mL/min — ABNORMAL LOW (ref >60)
Glucose, Bld: 171 mg/dL — ABNORMAL HIGH (ref 70–99)
Potassium: 3.8 mmol/L (ref 3.5–5.1)
Sodium: 137 mmol/L (ref 135–145)

## 2021-03-15 LAB — HEMOGLOBIN A1C
Hgb A1c MFr Bld: 7.6 % — ABNORMAL HIGH (ref 4.8–5.6)
Mean Plasma Glucose: 171 mg/dL

## 2021-03-15 MED ORDER — AMOXICILLIN-POT CLAVULANATE 875-125 MG PO TABS
1.0000 | ORAL_TABLET | Freq: Two times a day (BID) | ORAL | Status: DC
  Administered 2021-03-15 – 2021-03-16 (×3): 1 via ORAL
  Filled 2021-03-15 (×3): qty 1

## 2021-03-15 MED ORDER — FUROSEMIDE 10 MG/ML IJ SOLN
20.0000 mg | Freq: Once | INTRAMUSCULAR | Status: AC
  Administered 2021-03-15: 20 mg via INTRAVENOUS
  Filled 2021-03-15: qty 2

## 2021-03-15 MED ORDER — DOXYCYCLINE HYCLATE 100 MG PO TABS
100.0000 mg | ORAL_TABLET | Freq: Two times a day (BID) | ORAL | Status: DC
  Administered 2021-03-15 – 2021-03-16 (×3): 100 mg via ORAL
  Filled 2021-03-15 (×3): qty 1

## 2021-03-15 MED ORDER — ENOXAPARIN SODIUM 40 MG/0.4ML IJ SOSY
40.0000 mg | PREFILLED_SYRINGE | INTRAMUSCULAR | Status: DC
  Administered 2021-03-15 – 2021-03-16 (×2): 40 mg via SUBCUTANEOUS
  Filled 2021-03-15 (×2): qty 0.4

## 2021-03-15 NOTE — Plan of Care (Signed)

## 2021-03-15 NOTE — Consult Note (Signed)
WOC Nurse Consult Note: Consult requested for right heel and foot prior to Dr Sharol Given of the ortho service involvement.  He is now following for assessment and plan of care and has ordered compression stockings to be applied.  Current dressing which had previously been applied of xerform gauze can be discontinued at that time.  Right foot with generalized edema and erythema; dry crusted red partial thickness wounds across plantar foot; approx 10X15X.1cm, no odor drainage or fluctuance. Right heel with Stage 2 pressure injury; 1X2cm, red intact fluid filled blister. Pressure Injury POA: No Dressing procedure/placement/frequency: Bedside nurse should remove current dressing and apply compression stockings when they arrive.  Pt will plan to follow-up with Dr Sharol Given after discharge.  Float right heel to reduce pressure. Please re-consult if further assistance is needed.  Thank-you,  Julien Girt MSN, [place], [place], [place], [place]

## 2021-03-15 NOTE — Consult Note (Signed)
ORTHOPAEDIC CONSULTATION  REQUESTING PHYSICIAN: Lavina Hamman, MD  Chief Complaint: Right foot cellulitis  HPI: Christian [name] is a 80 y.o. male who presents with cellulitis of the right lower extremity.  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  Past Medical History:  Diagnosis Date   Angina pectoris [facility])    Nuclear stress test 8/18: EF 59, fixed inf-lat defect likely attenuation, no ischemia, Low Risk   Arthritis    CMT (Charcot-Marie-Tooth disease)    Deafness    Diverticulosis    DM (diabetes mellitus) ([place])    HLD (hyperlipidemia)    HTN (hypertension)    Iron deficiency anemia    Multiple gastric ulcers    Overactive bladder    Ulcer    Past Surgical History:  Procedure Laterality Date   COLONOSCOPY N/A 08/27/2013   Procedure: COLONOSCOPY;  Surgeon: Jerene Bears, MD;  Location: [facility];  Service: Gastroenterology;  Laterality: N/A;   ESOPHAGOGASTRODUODENOSCOPY N/A 08/27/2013   Procedure: ESOPHAGOGASTRODUODENOSCOPY (EGD);  Surgeon: Jerene Bears, MD;  Location: [facility];  Service: Gastroenterology;  Laterality: N/A;   FOOT AMPUTATION THROUGH METATARSAL Left    FOOT SURGERY     age 24   TOTAL HIP ARTHROPLASTY Bilateral 2002, 2003   TOTAL KNEE ARTHROPLASTY Bilateral 1999   Social History   Socioeconomic History   Marital status: Married    Spouse name: Not on file   Number of children: 4   Years of education: Not on file   Highest education level: Not on file  Occupational History   Occupation: retired Personal assistant  Tobacco Use   Smoking status: Never   Smokeless tobacco: Never  Substance and Sexual Activity   Alcohol use: No   Drug use: No   Sexual activity: Not on file  Other Topics Concern   Not on file  Social History Narrative   Not on file   Social Determinants of Health   Financial Resource Strain: Not on file  Food Insecurity: Not on file   Transportation Needs: Not on file  Physical Activity: Not on file  Stress: Not on file  Social Connections: Not on file   Family History  Problem Relation Age of Onset   Cervical cancer Mother        mets   Breast cancer Sister        mets   Diabetes Sister    Diabetes Brother    Heart disease Sister    Cervical cancer Sister    - negative except otherwise stated in the family history section Allergies  Allergen Reactions   Lipitor [Atorvastatin] Other (See Comments)    unknown   Prior to Admission medications   Medication Sig Start Date End Date Taking? Authorizing Provider  amLODipine (NORVASC) 10 MG tablet TAKE 1 TABLET BY MOUTH  DAILY Patient taking differently: Take 10 mg by mouth daily. 01/05/21  Yes Jettie Booze, MD  escitalopram (LEXAPRO) 5 MG tablet Take 5 mg by mouth daily. 01/24/21  Yes [provider]  isosorbide mononitrate (IMDUR) 60 MG 24 hr tablet Take 1 tablet (60 mg total) by mouth daily. 08/25/20  Yes Jettie Booze, MD  LORazepam (ATIVAN) 1 MG tablet Take 1 mg by mouth daily as needed for anxiety or sleep. 01/22/20  Yes [provider]  pravastatin (PRAVACHOL) 40 MG tablet Take 40 mg by mouth daily. 12/25/19  Yes [provider]  tamsulosin (  FLOMAX) 0.4 MG CAPS capsule Take 0.4 mg by mouth in the morning and at bedtime. 11/10/20  Yes [provider]  tiZANidine (ZANAFLEX) 4 MG tablet Take 4 mg by mouth at bedtime. 01/27/21  Yes [provider]  torsemide (DEMADEX) 20 MG tablet Take by mouth. 01/28/20 03/12/21 Yes [provider]  ACCU-CHEK SMARTVIEW test strip USE 1 STRIP TO CHECK GLUCOSE TWICE DAILY AS DIRECTED 02/15/20   [provider]  nitroGLYCERIN (NITROSTAT) 0.4 MG SL tablet Place 1 tablet (0.4 mg total) under the tongue every 5 (five) minutes as needed for chest pain. Please keep upcoming appt in May with Dr. Irish Lack. Thank you Patient not taking: Reported on 03/12/2021 01/09/20   Jettie Booze, MD  predniSONE (DELTASONE) 10 MG tablet Take 2 tablets (20 mg total) by mouth daily with breakfast. Patient not taking: Reported on 03/12/2021 08/09/17   [name], MD  sulfamethoxazole-trimethoprim (BACTRIM) 400-80 MG tablet Take 1 tablet by mouth 2 (two) times daily. Patient not taking: Reported on 03/12/2021 03/02/20   Landis Martins, DPM  pantoprazole (PROTONIX) 40 MG tablet Take 1 tablet (40 mg total) by mouth daily. Patient not taking: Reported on 03/12/2021 12/27/17 03/13/21  Jerene Bears, MD   DG Chest 2 View  Result Date: 03/13/2021 CLINICAL DATA:  Shortness of breath.  Concern for infection EXAM: CHEST - 2 VIEW COMPARISON:  Chest radiograph and CT 09/14/2020. FINDINGS: Chronic elevation of left hemidiaphragm with basilar atelectasis or scarring. Stable heart size and mediastinal contours. Aortic atherosclerosis. There is mild peribronchial thickening. No acute airspace disease. No pneumothorax or pleural effusion. Gaseous gastric distension. Advanced bilateral shoulder arthropathy. IMPRESSION: 1. Chronic elevation of left hemidiaphragm with basilar atelectasis or scarring. 2. Mild peribronchial thickening may be related to bronchitis. No acute airspace disease. Electronically Signed   By: Keith Rake M.D.   On: 03/13/2021 15:34   - pertinent xrays, CT, MRI studies were reviewed and independently interpreted  Positive ROS: All other systems have been reviewed and were otherwise negative with the exception of those mentioned in the HPI and as above.  Physical Exam: General: Alert, no acute distress Psychiatric: Patient is competent for consent with normal mood and affect Lymphatic: No axillary or cervical lymphadenopathy Cardiovascular: No pedal edema Respiratory: No cyanosis, no use of accessory musculature GI: No organomegaly, abdomen is soft and non-tender    Images:  @ENCIMAGES @  Labs:  Lab Results  Component Value Date   HGBA1C 7.6 (H) 03/12/2021   HGBA1C  (H) 12/04/2009    10.5 (NOTE) The ADA recommends the following therapeutic goal for glycemic control related to Hgb A1c measurement: Goal of therapy: <6.5 Hgb A1c  Reference: American Diabetes Association: Clinical Practice Recommendations 2010, Diabetes Care, 2010, 33: (Suppl  1).   ESRSEDRATE 57 (H) 03/11/2021   CRP 16.9 (H) 03/11/2021   REPTSTATUS PENDING 03/12/2021   GRAMSTAIN  10/21/2010    NO WBC SEEN NO SQUAMOUS EPITHELIAL CELLS SEEN NO ORGANISMS SEEN   GRAMSTAIN  10/21/2010    NO WBC SEEN NO SQUAMOUS EPITHELIAL CELLS SEEN NO ORGANISMS SEEN   CULT  03/12/2021    NO GROWTH 2 DAYS Performed at [facility], [place] [address] [place], [place] [address]    LABORGA STAPHYLOCOCCUS AUREUS 10/21/2010    Lab Results  Component Value Date   ALBUMIN 2.6 (L) 03/13/2021   ALBUMIN 3.3 (L) 12/03/2009     CBC EXTENDED Latest Ref Rng & Units 03/13/2021 03/12/2021 03/11/2021  WBC 4.0 -  10.5 K/uL 6.8 7.5 9.3  RBC 4.22 - 5.81 MIL/uL 4.13(L) 4.13(L) 4.47  HGB 13.0 - 17.0 g/dL 11.6(L) 11.8(L) 12.6(L)  HCT 39.0 - 52.0 % 36.1(L) 35.6(L) 38.5(L)  PLT 150 - 400 K/uL 281 226 242  NEUTROABS 1.7 - 7.7 K/uL - - 5.8  LYMPHSABS 0.7 - 4.0 K/uL - - 2.0    Neurologic: Patient does not have protective sensation bilateral lower extremities.   MUSCULOSKELETAL:   Skin: Examination patient has significant decreased swelling in the right lower extremity.  Patient still has some dermatitis on the dorsum of the right foot but there is no cellulitis , no open ulcers, no drainage no tenderness to palpation no clinical signs of active Charcot collapse.  Reviewing the photographs upon admission compared to photographs today shows resolving cellulitis with resolving swelling and dermatitis.  Most recent hemoglobin A1c 7.6 with an albumin of 2.6.  Patient does have an elevated sed rate of 57 and CRP of 16.9.  Assessment: Assessment: Resolving cellulitis with Charcot collapse right foot with venous insufficiency  and diabetic insensate neuropathy.  Plan: I will order compression socks for the right lower extremity I will follow-up in the office in 1 week.  Weightbearing as tolerated on the right.  Thank you for the consult and the opportunity to see Christian [name], [place] [phone] 8:11 AM

## 2021-03-15 NOTE — TOC Initial Note (Addendum)
Transition of Care [facility]) - Initial/Assessment Note    Patient Details  Name: Christian [name] MRN: [id number] Date of Birth: Dec 26, 1940  Transition [facility]) CM/SW Contact:    Marilu Favre, RN Phone Number: 03/15/2021, 12:58 PM  Clinical Narrative:                 Damaris Schooner to patient at bedside via interpreter Aaron Edelman [phone]. Discussed PT recommendation for short term SNF at discharge. Patient in agreement. He is from [place] , however his children live in [place], [place] , [place] , and [place] . He would like a SNF in [place] area.   NCM explained once TOC team has bed offers , TOC will provide bed offers to him and his family. Patient voiced understanding and consented for NCM to call daughter Danelle Earthly [phone]  . Called [place] awaiting call back.   [place] returned call. Above explained and she is in agreement with SNF for short term rehab. Her first choice is [place], and then [place]    Expected Discharge Plan: [place]     Patient Goals and CMS Choice Patient states their goals for this hospitalization and ongoing recovery are:: to return to home CMS Medicare.gov Compare Post Acute Care list provided to:: Patient Choice offered to / list presented to : Patient  Expected Discharge Plan and Services Expected Discharge Plan: [place]   Discharge Planning Services: CM Consult   Living arrangements for the past 2 months: Single Family Home                   DME Agency: NA       HH Arranged: NA          Prior Living Arrangements/Services Living arrangements for the past 2 months: Single Family Home Lives with:: Self Patient language and need for interpreter reviewed:: Yes Do you feel safe going back to the place where you live?: Yes      Need for Family Participation in Patient Care: Yes (Comment) Care giver support system in place?: Yes (comment)   Criminal  Activity/Legal Involvement Pertinent to Current Situation/Hospitalization: No - Comment as needed  Activities of Daily Living Home Assistive Devices/Equipment: Walker (specify type) (front wheel walker) ADL Screening (condition at time of admission) Patient's cognitive ability adequate to safely complete daily activities?: Yes Is the patient deaf or have difficulty hearing?: Yes Does the patient have difficulty seeing, even when wearing glasses/contacts?: No Does the patient have difficulty concentrating, remembering, or making decisions?: No Patient able to express need for assistance with ADLs?: Yes Does the patient have difficulty dressing or bathing?: No Independently performs ADLs?: Yes (appropriate for developmental age) Does the patient have difficulty walking or climbing stairs?: Yes Weakness of Legs: Both Weakness of Arms/Hands: None  Permission Sought/Granted   Permission granted to share information with : Yes, Verbal Permission Granted  Share Information with NAME: Danelle Earthly [phone]     Permission granted to share info w Relationship: daughter     Emotional Assessment Appearance:: Appears stated age Attitude/Demeanor/Rapport: Engaged Affect (typically observed): Accepting Orientation: : Oriented to Self, Oriented to Place, Oriented to  Time, Oriented to Situation Alcohol / Substance Use: Not Applicable Psych Involvement: No (comment)  Admission diagnosis:  Wound infection [T14.8XXA, L08.9] Wound cellulitis [L03.90] Cellulitis, unspecified cellulitis site [L03.90] Patient Active Problem List   Diagnosis Date Noted   Pressure injury of skin 03/15/2021   Wound  cellulitis 03/12/2021   Acute-on-chronic kidney injury ([place]) 03/12/2021   Decreased activities of daily living (ADL) 02/26/2020   Dysphagia 02/26/2020   Impaired functional mobility, balance, gait, and endurance 02/26/2020   Acute respiratory failure with hypoxia ([place]) 10/27/2019   Sepsis ([place])  10/21/2019   Generalized anxiety disorder 09/01/2019   Primary insomnia 09/01/2019   GERD without esophagitis 07/23/2019   Meningioma ([place]) 07/23/2019   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   Ulcer of left foot, limited to breakdown of skin ([place]) 02/07/2018   Chronic pain of both shoulders 08/09/2017   Pain in right wrist 08/09/2017   Atherosclerosis of renal artery ([place]) 04/24/2017   Type 2 diabetes mellitus without complications ([place]) 57/49/3552   Chronic left-sided low back pain with left-sided sciatica 07/24/2016   Lower extremity edema 01/28/2015   Angina pectoris ([place]) 07/08/2014   Anemia, unspecified 05/06/2014   Acute gastric ulcer 08/27/2013   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   IDA (iron deficiency anemia) 08/25/2013   Mixed hyperlipidemia 08/13/2013   Essential hypertension, benign 08/13/2013   PCP:  [name], DO Pharmacy:   [id number] [place],  - [address] [place] [address] Phone: [phone] Fax: [phone]  [facility]  ([place], [place] [address] Phone: [phone] Fax: [phone]     Social Determinants of Health (SDOH) Interventions    Readmission Risk Interventions No flowsheet data found.

## 2021-03-15 NOTE — Social Work (Signed)
CSW started [employer], reference number [id number]. Pts SNF preference will need to be updated with [employer].   Christian [name], Latanya Presser, [place] Social Worker [phone]

## 2021-03-15 NOTE — Progress Notes (Signed)
Orthopedic Tech Progress Note Patient Details:  Christian [name] [birth year] [id number] Compression Socks order from [facility] at 9:03 am  Patient ID: Christian [name], male   DOB: 1940/10/23, 79 y.o.   MRN: [id number]  [name] 03/15/2021, 11:45 AM

## 2021-03-15 NOTE — Progress Notes (Signed)
Sign language interpreter has been helping out today. Patient appears to be in good spirits. If interpreter needed please call [phone]. If after hours, call [facility] [phone]; for second after hours option call [facility] at [phone]. Dressing removed on LLE and Dr. Jess Barters compression stocking applied.

## 2021-03-15 NOTE — NC FL2 (Signed)
[place] LEVEL OF CARE SCREENING TOOL     IDENTIFICATION  Patient Name: Christian [name] Birthdate: Aug 25, 1941 Sex: male Admission Date (Current Location): 03/11/2021  [facility] and [place] Number:  Herbalist and Address:  [facility] [place], [place] [address] [place], [place] [address]      Provider Number: [id number]  Attending Physician Name and Address:  Lavina Hamman, MD  Relative Name and Phone Number:       Current Level of Care: Hospital Recommended Level of Care: [place] Prior Approval Number:    Date Approved/Denied:   PASRR Number: [id number]  Discharge Plan: SNF    Current Diagnoses: Patient Active Problem List   Diagnosis Date Noted   Pressure injury of skin 03/15/2021   Wound cellulitis 03/12/2021   Acute-on-chronic kidney injury ([place]) 03/12/2021   Decreased activities of daily living (ADL) 02/26/2020   Dysphagia 02/26/2020   Impaired functional mobility, balance, gait, and endurance 02/26/2020   Acute respiratory failure with hypoxia ([place]) 10/27/2019   Sepsis ([place]) 10/21/2019   Generalized anxiety disorder 09/01/2019   Primary insomnia 09/01/2019   GERD without esophagitis 07/23/2019   Meningioma ([place]) 07/23/2019   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   Ulcer of left foot, limited to breakdown of skin ([place]) 02/07/2018   Chronic pain of both shoulders 08/09/2017   Pain in right wrist 08/09/2017   Atherosclerosis of renal artery ([place]) 04/24/2017   Type 2 diabetes mellitus without complications ([place]) 74/16/3845   Chronic left-sided low back pain with left-sided sciatica 07/24/2016   Lower extremity edema 01/28/2015   Angina pectoris ([place]) 07/08/2014   Anemia, unspecified 05/06/2014   Acute gastric ulcer 08/27/2013   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   IDA (iron deficiency anemia) 08/25/2013   Mixed hyperlipidemia 08/13/2013   Essential  hypertension, benign 08/13/2013    Orientation RESPIRATION BLADDER Height & Weight     Self, Time, Situation, Place  Normal External catheter Weight: 185 lb (83.9 kg) Height:  5\' 8"  (172.7 cm)  BEHAVIORAL SYMPTOMS/MOOD NEUROLOGICAL BOWEL NUTRITION STATUS      Continent Diet  AMBULATORY STATUS COMMUNICATION OF NEEDS Skin   Limited Assist Verbally Normal                       Personal Care Assistance Level of Assistance  Bathing, Feeding, Dressing Bathing Assistance: Limited assistance Feeding assistance: Independent Dressing Assistance: Limited assistance     Functional Limitations Info  Sight, Hearing, Speech Sight Info: Adequate Hearing Info: Impaired (Pt is deaf, reads lips and signs) Speech Info: Adequate    SPECIAL CARE FACTORS FREQUENCY  PT (By licensed PT), OT (By licensed OT)     PT Frequency: 5x a week OT Frequency: 5x a week            Contractures Contractures Info: Not present    Additional Factors Info  Code Status, Allergies Code Status Info: [place] Allergies Info: Lipitor           Current Medications (03/15/2021):  This is the current hospital active medication list Current Facility-Administered Medications  Medication Dose Route Frequency Provider Last Rate Last Admin   acetaminophen (TYLENOL) tablet 650 mg  650 mg Oral Q6H PRN Lavina Hamman, MD       Or   acetaminophen (TYLENOL) suppository 650 mg  650 mg Rectal Q6H PRN Lavina Hamman, MD  amoxicillin-clavulanate (AUGMENTIN) 875-125 MG per tablet 1 tablet  1 tablet Oral Q12H Lavina Hamman, MD   1 tablet at 03/15/21 1221   docusate sodium (COLACE) capsule 100 mg  100 mg Oral BID Lavina Hamman, MD   100 mg at 03/15/21 0839   doxycycline (VIBRA-TABS) tablet 100 mg  100 mg Oral Q12H Lavina Hamman, MD   100 mg at 03/15/21 1221   enoxaparin (LOVENOX) injection 40 mg  40 mg Subcutaneous Q24H Lavina Hamman, MD       escitalopram (LEXAPRO) tablet 5 mg  5 mg Oral Daily Lavina Hamman, MD   5 mg at 03/15/21 4098   furosemide (LASIX) injection 20 mg  20 mg Intravenous Once Lavina Hamman, MD       insulin aspart (novoLOG) injection 0-5 Units  0-5 Units Subcutaneous QHS Lavina Hamman, MD       insulin aspart (novoLOG) injection 0-9 Units  0-9 Units Subcutaneous TID WC Lavina Hamman, MD   2 Units at 03/15/21 1221   isosorbide mononitrate (IMDUR) 24 hr tablet 60 mg  60 mg Oral Daily Lavina Hamman, MD   60 mg at 03/15/21 0839   LORazepam (ATIVAN) tablet 1 mg  1 mg Oral Daily PRN Lavina Hamman, MD   1 mg at 03/15/21 0038   ondansetron (ZOFRAN) tablet 4 mg  4 mg Oral Q6H PRN Lavina Hamman, MD       Or   ondansetron [facility]) injection 4 mg  4 mg Intravenous Q6H PRN Lavina Hamman, MD       oxyCODONE (Oxy IR/ROXICODONE) immediate release tablet 5-10 mg  5-10 mg Oral Q6H PRN Shawna Clamp, MD   5 mg at 03/14/21 2159   polyethylene glycol (MIRALAX / GLYCOLAX) packet 17 g  17 g Oral Daily PRN Lavina Hamman, MD       tamsulosin [facility]) capsule 0.4 mg  0.4 mg Oral QPC supper Lavina Hamman, MD   0.4 mg at 03/14/21 1813   tiZANidine (ZANAFLEX) tablet 4 mg  4 mg Oral QHS Lavina Hamman, MD   4 mg at 03/14/21 2159     Discharge Medications: Please see discharge summary for a list of discharge medications.  Relevant Imaging Results:  Relevant Lab Results:   Additional Information SSN: [id number]  Emeterio Reeve, [place]

## 2021-03-15 NOTE — Progress Notes (Signed)
[facility] Progress Note  Patient: Christian [name]    ZCH:[id number]  DOA: 03/11/2021     Date of Service: the patient was seen and examined on 03/15/2021  Brief hospital course: Past medical history of type II DM, neuropathy, PAD, transmetatarsal amputation of left foot, HTN, CAD, CKD 3A, GERD, HLD.  Presents with complaints of worsening right foot wound. Appears to have cellulitis without any evidence of osteomyelitis or abscess that required surgical intervention. Currently plan is treated volume overload and arrange for safe discharge at SNF  Subjective: No nausea no vomiting.  No fever no chills.  Continues to have respiratory distress.  No chest pain or chest tightness.  Pain in the leg is improving but he still has intermittent sharp shooting pain.  Assessment and Plan: 1.  Right foot cellulitis Currently on IV vancomycin and Zosyn.  Changed to oral Augmentin and doxycycline. X-ray negative for osteomyelitis.  MRI negative for osteomyelitis as well as abscess. Orthopedic was consulted. Currently no indication for surgical intervention.  Monitor. Full weightbearing with postop shoes. Dr. Sharol Given recommend no surgical intervention for now and outpatient follow-up.  Compression stockings.  2.  Type II DM with hyperglycemia without long-term insulin use. Currently on sliding scale insulin.  Monitor.  3.  Acute kidney injury on CKD 3A. Baseline serum creatinine 1.5. On presentation of 1.87. Treated with IV fluids.  Now improving.  IV fluids stopped due to concern for volume overload.   4.  HTN Blood pressure stable. Will continue current regimen.  5.  Acute on chronic diastolic CHF Patient appears to have some cough and some shortness of breath.  Chest x-ray unremarkable. Takes torsemide at home currently on hold. Will likely transition to Lasix rather than torsemide.  1 dose of Lasix right now.  Scheduled Meds:  amoxicillin-clavulanate  1 tablet Oral Q12H   docusate  sodium  100 mg Oral BID   doxycycline  100 mg Oral Q12H   enoxaparin (LOVENOX) injection  40 mg Subcutaneous Q24H   escitalopram  5 mg Oral Daily   insulin aspart  0-5 Units Subcutaneous QHS   insulin aspart  0-9 Units Subcutaneous TID WC   isosorbide mononitrate  60 mg Oral Daily   tamsulosin  0.4 mg Oral QPC supper   tiZANidine  4 mg Oral QHS   Continuous Infusions:   PRN Meds: acetaminophen **OR** acetaminophen, LORazepam, ondansetron **OR** ondansetron (ZOFRAN) IV, oxyCODONE, polyethylene glycol  Body mass index is 28.13 kg/m.    Pressure Injury 03/14/21 Heel Right Stage 2 -  Partial thickness loss of dermis presenting as a shallow open injury with a red, pink wound bed without slough. (Active)  03/14/21   Location: Heel  Location Orientation: Right  Staging: Stage 2 -  Partial thickness loss of dermis presenting as a shallow open injury with a red, pink wound bed without slough.  Wound Description (Comments):   Present on Admission: No     DVT Prophylaxis:   enoxaparin (LOVENOX) injection 40 mg Start: 03/15/21 1400 SCDs Start: 03/12/21 0044    Advance goals of care discussion: Pt is Full code.  Family Communication: no family was present at bedside, at the time of interview.   Data Reviewed: I have personally reviewed and interpreted daily labs, tele strips, imaging. Renal function stable.  Physical Exam:  General: Appear in mild distress, no Rash; Oral Mucosa Clear, moist. no Abnormal Neck Mass Or lumps, Conjunctiva normal  Cardiovascular: S1 and S2 Present, no Murmur, Respiratory: good respiratory effort,  Bilateral Air entry present and bilateral Crackles, no wheezes Abdomen: Bowel Sound present, Soft and no tenderness Extremities: bilateral Pedal edema, left transmetatarsal amputation Neurology: alert and oriented to time, place, and person affect appropriate. no new focal deficit Gait not checked due to patient safety concerns   Vitals:   03/14/21 1255  03/14/21 2129 03/15/21 0619 03/15/21 1500  BP: 125/61 (!) 141/67 (!) 157/87 (!) 146/76  Pulse: 67 81 83 74  Resp: 18 18 18 18   Temp: 97.7 F (36.5 C) 98.8 F (37.1 C) 98.8 F (37.1 C) 97.8 F (36.6 C)  TempSrc: Oral Oral Oral Oral  SpO2: 95% 94% 94% 96%  Weight:      Height:        Disposition:  Status is: Inpatient  Remains inpatient appropriate because:Inpatient level of care appropriate due to severity of illness  Dispo: The patient is from: Home              Anticipated d/c is to: Home              Patient currently is not medically stable to d/c.   Difficult to place patient No  Time spent: 35 minutes. I reviewed all nursing notes, pharmacy notes, vitals, pertinent old records. I have discussed plan of care as described above with RN.  Author: Berle Mull, MD [facility] 03/15/2021 7:20 PM  To reach On-call, see care teams to locate the attending and reach out via [email or web address]. Between 7PM-7AM, please contact night-coverage If you still have difficulty reaching the attending provider, please page the [facility] (Director on Call) for [facility] on amion for assistance.

## 2021-03-15 NOTE — Progress Notes (Addendum)
Occupational Therapy Treatment Patient Details Name: Christian [name] MRN: [id number] DOB: 16-Jul-1941 Today's Date: 03/15/2021    History of present illness Pt admit 7/1 presenting with complaints of worsening right foot wound.  Appears to have cellulitis without any evidence of osteomyelitis or abscess that required surgical intervention. Past medical history of type II DM, neuropathy, PAD, transmetatarsal amputation of left foot, HTN, CAD, CKD 3A, GERD, HLD.   OT comments  Pt making good progress with functional goals. Session focused on OOB activity using RW to bathroom, toileting tasks, standing at sink for grooming/hygiene. Sign language interpreter utilized this session. Pt now agreeable to SNF for ST rehab. OT will continue to follow acutely to maximize level of function and safety  Follow Up Recommendations  SNF    Equipment Recommendations  None recommended by OT    Recommendations for Other Services      Precautions / Restrictions Precautions Precautions: Fall Required Braces or Orthoses: Other Brace Other Brace: post op shoe Restrictions Weight Bearing Restrictions: Yes Other Position/Activity Restrictions: post op shoe with heel support (black)       Mobility Bed Mobility Overal bed mobility: Needs Assistance Bed Mobility: Supine to Sit     Supine to sit: Min guard          Transfers Overall transfer level: Needs assistance Equipment used: Rolling walker (2 wheeled) Transfers: Sit to/from Stand Sit to Stand: Min guard         General transfer comment: pt with flexed posture and increased time    Balance Overall balance assessment: Needs assistance Sitting-balance support: No upper extremity supported;Feet supported Sitting balance-[name] Scale: Fair     Standing balance support: Bilateral upper extremity supported;During functional activity Standing balance-[name] Scale: Poor                             ADL either performed or  assessed with clinical judgement   ADL Overall ADL's : Needs assistance/impaired     Grooming: Wash/dry face;Wash/dry hands;Min guard;Standing;Cueing for Scientist, research (physical sciences) Transfer: Min guard;RW;Cueing for safety;Ambulation;Comfort height toilet;Grab bars   Toileting- Clothing Manipulation and Hygiene: Min guard;Sit to/from stand       Functional mobility during ADLs: Rolling walker;Min guard       Vision Baseline Vision/History: Wears glasses Wears Glasses: At all times Patient Visual Report: No change from baseline     Perception     Praxis      Cognition Arousal/Alertness: Awake/alert Behavior During Therapy: WFL for tasks assessed/performed Overall Cognitive Status: Within Functional Limits for tasks assessed                                          Exercises     Shoulder Instructions       General Comments      Pertinent Vitals/ Pain       Pain Assessment: No/denies pain  Home Living                                          Prior Functioning/Environment              Frequency  Min 2X/week  Progress Toward Goals  OT Goals(current goals can now be found in the care plan section)  Progress towards OT goals: Progressing toward goals  Acute Rehab OT Goals Patient Stated Goal: to go home  Plan Discharge plan remains appropriate    Co-evaluation                 AM-PAC OT "6 Clicks" Daily Activity     Outcome Measure   Help from another person eating meals?: None (set up) Help from another person taking care of personal grooming?: A Little Help from another person toileting, which includes using toliet, bedpan, or urinal?: A Little Help from another person bathing (including washing, rinsing, drying)?: A Lot Help from another person to put on and taking off regular upper body clothing?: A Little Help from another person to put on and taking off regular lower body  clothing?: A Lot 6 Click Score: 17    End of Session Equipment Utilized During Treatment: Rolling walker;Gait belt  OT Visit Diagnosis: Unsteadiness on feet (R26.81);Muscle weakness (generalized) (M62.81)   Activity Tolerance Patient tolerated treatment well   Patient Left in chair;with call bell/phone within reach;with chair alarm set   Nurse Communication          Time: [phone] OT Time Calculation (min): 25 min  Charges: OT General Charges $OT Visit: 1 Visit OT Treatments $Self Care/Home Management : 8-22 mins $Therapeutic Activity: 8-22 mins     Britt Bottom 03/15/2021, 11:41 AM

## 2021-03-15 NOTE — Progress Notes (Signed)
Inpatient Diabetes Program Recommendations  AACE/ADA: New Consensus Statement on Inpatient Glycemic Control (2015)  Target Ranges:  Prepandial:   less than 140 mg/dL      Peak postprandial:   less than 180 mg/dL (1-2 hours)      Critically ill patients:  140 - 180 mg/dL   Lab Results  Component Value Date   GLUCAP 134 (H) 03/15/2021   HGBA1C 7.6 (H) 03/12/2021    Review of Glycemic Control Results for Christian [name], Christian [name] (MRN [id number]) as of 03/15/2021 09:35  Ref. Range 03/14/2021 08:30 03/14/2021 12:47 03/14/2021 16:57 03/14/2021 17:48 03/14/2021 21:36 03/15/2021 08:30  Glucose-Capillary Latest Ref Range: 70 - 99 mg/dL 91 145 (H) 425 (H) 366 (H) 171 (H) 134 (H)   Diabetes history: DM 2 Outpatient Diabetes medications: none listed Current orders for Inpatient glycemic control: Novolog 0-9 units tid + hs  A1c 7.6% on 7/2  Inpatient Diabetes Program Recommendations:    Glucose trends increase after meal intake  - may consider adding Novolog 2 units tid meal coverage if eating >50% of meals  Thanks,  Tama Headings RN, MSN, BC-ADM Inpatient Diabetes Coordinator Team Pager [phone] (8a-5p)

## 2021-03-15 NOTE — Progress Notes (Signed)
ABI's have been completed. Preliminary results can be found in CV Proc through chart review.   03/15/21 12:15 PM Christian [name] RVT

## 2021-03-16 DIAGNOSIS — D649 Anemia, unspecified: Secondary | ICD-10-CM | POA: Diagnosis not present

## 2021-03-16 DIAGNOSIS — E1149 Type 2 diabetes mellitus with other diabetic neurological complication: Secondary | ICD-10-CM | POA: Diagnosis not present

## 2021-03-16 DIAGNOSIS — R339 Retention of urine, unspecified: Secondary | ICD-10-CM | POA: Diagnosis not present

## 2021-03-16 DIAGNOSIS — Z7401 Bed confinement status: Secondary | ICD-10-CM | POA: Diagnosis not present

## 2021-03-16 DIAGNOSIS — I10 Essential (primary) hypertension: Secondary | ICD-10-CM | POA: Diagnosis not present

## 2021-03-16 DIAGNOSIS — F32A Depression, unspecified: Secondary | ICD-10-CM | POA: Diagnosis not present

## 2021-03-16 DIAGNOSIS — Z9181 History of falling: Secondary | ICD-10-CM | POA: Diagnosis not present

## 2021-03-16 DIAGNOSIS — Z794 Long term (current) use of insulin: Secondary | ICD-10-CM | POA: Diagnosis not present

## 2021-03-16 DIAGNOSIS — N3281 Overactive bladder: Secondary | ICD-10-CM | POA: Diagnosis not present

## 2021-03-16 DIAGNOSIS — M24542 Contracture, left hand: Secondary | ICD-10-CM | POA: Diagnosis not present

## 2021-03-16 DIAGNOSIS — R2681 Unsteadiness on feet: Secondary | ICD-10-CM | POA: Diagnosis not present

## 2021-03-16 DIAGNOSIS — L89620 Pressure ulcer of left heel, unstageable: Secondary | ICD-10-CM | POA: Diagnosis not present

## 2021-03-16 DIAGNOSIS — E1122 Type 2 diabetes mellitus with diabetic chronic kidney disease: Secondary | ICD-10-CM | POA: Diagnosis not present

## 2021-03-16 DIAGNOSIS — S82125D Nondisplaced fracture of lateral condyle of left tibia, subsequent encounter for closed fracture with routine healing: Secondary | ICD-10-CM | POA: Diagnosis not present

## 2021-03-16 DIAGNOSIS — E119 Type 2 diabetes mellitus without complications: Secondary | ICD-10-CM | POA: Diagnosis not present

## 2021-03-16 DIAGNOSIS — Z23 Encounter for immunization: Secondary | ICD-10-CM | POA: Diagnosis not present

## 2021-03-16 DIAGNOSIS — N1831 Chronic kidney disease, stage 3a: Secondary | ICD-10-CM | POA: Diagnosis not present

## 2021-03-16 DIAGNOSIS — L0390 Cellulitis, unspecified: Secondary | ICD-10-CM | POA: Diagnosis not present

## 2021-03-16 DIAGNOSIS — N183 Chronic kidney disease, stage 3 unspecified: Secondary | ICD-10-CM | POA: Diagnosis not present

## 2021-03-16 DIAGNOSIS — G9009 Other idiopathic peripheral autonomic neuropathy: Secondary | ICD-10-CM | POA: Diagnosis not present

## 2021-03-16 DIAGNOSIS — S91301D Unspecified open wound, right foot, subsequent encounter: Secondary | ICD-10-CM | POA: Diagnosis not present

## 2021-03-16 DIAGNOSIS — M24541 Contracture, right hand: Secondary | ICD-10-CM | POA: Diagnosis not present

## 2021-03-16 DIAGNOSIS — Z743 Need for continuous supervision: Secondary | ICD-10-CM | POA: Diagnosis not present

## 2021-03-16 DIAGNOSIS — I739 Peripheral vascular disease, unspecified: Secondary | ICD-10-CM | POA: Diagnosis not present

## 2021-03-16 DIAGNOSIS — H913 Deaf nonspeaking, not elsewhere classified: Secondary | ICD-10-CM | POA: Diagnosis not present

## 2021-03-16 DIAGNOSIS — R262 Difficulty in walking, not elsewhere classified: Secondary | ICD-10-CM | POA: Diagnosis not present

## 2021-03-16 DIAGNOSIS — Z89432 Acquired absence of left foot: Secondary | ICD-10-CM | POA: Diagnosis not present

## 2021-03-16 DIAGNOSIS — L03115 Cellulitis of right lower limb: Secondary | ICD-10-CM | POA: Diagnosis not present

## 2021-03-16 DIAGNOSIS — M6281 Muscle weakness (generalized): Secondary | ICD-10-CM | POA: Diagnosis not present

## 2021-03-16 DIAGNOSIS — K5900 Constipation, unspecified: Secondary | ICD-10-CM | POA: Diagnosis not present

## 2021-03-16 DIAGNOSIS — E785 Hyperlipidemia, unspecified: Secondary | ICD-10-CM | POA: Diagnosis not present

## 2021-03-16 DIAGNOSIS — I214 Non-ST elevation (NSTEMI) myocardial infarction: Secondary | ICD-10-CM | POA: Diagnosis not present

## 2021-03-16 DIAGNOSIS — K219 Gastro-esophageal reflux disease without esophagitis: Secondary | ICD-10-CM | POA: Diagnosis not present

## 2021-03-16 LAB — GLUCOSE, CAPILLARY
Glucose-Capillary: 123 mg/dL — ABNORMAL HIGH (ref 70–99)
Glucose-Capillary: 156 mg/dL — ABNORMAL HIGH (ref 70–99)
Glucose-Capillary: 226 mg/dL — ABNORMAL HIGH (ref 70–99)

## 2021-03-16 LAB — RESP PANEL BY RT-PCR (FLU A&B, COVID) ARPGX2
Influenza A by PCR: NEGATIVE
Influenza B by PCR: NEGATIVE
SARS Coronavirus 2 by RT PCR: NEGATIVE

## 2021-03-16 MED ORDER — FUROSEMIDE 20 MG PO TABS: 20.0000 mg | ORAL_TABLET | Freq: Every day | ORAL | 0 refills | Status: DC

## 2021-03-16 MED ORDER — POLYETHYLENE GLYCOL 3350 17 G PO PACK: 17.0000 g | PACK | Freq: Every day | ORAL | 0 refills | Status: DC | PRN

## 2021-03-16 MED ORDER — AMOXICILLIN-POT CLAVULANATE 875-125 MG PO TABS: 1.0000 | ORAL_TABLET | Freq: Two times a day (BID) | ORAL | 0 refills | Status: AC

## 2021-03-16 MED ORDER — DOXYCYCLINE HYCLATE 100 MG PO TABS: 100.0000 mg | ORAL_TABLET | Freq: Two times a day (BID) | ORAL | 0 refills | Status: AC

## 2021-03-16 MED ORDER — HYDROCODONE-ACETAMINOPHEN 5-325 MG PO TABS: 1.0000 | ORAL_TABLET | Freq: Three times a day (TID) | ORAL | 0 refills | Status: AC | PRN

## 2021-03-16 NOTE — Discharge Summary (Signed)
[facility] Discharge Summary   Patient: Christian [name] MWU:[id number]  PCP: [name], DO  Date of admission: 03/11/2021   Date of discharge:  03/16/2021     Discharge Diagnoses:  Principal diagnosis Wound infection  Principal Problem:   Wound cellulitis Active Problems:   Essential hypertension, benign   Type 2 diabetes mellitus without complications ([place])   Acute-on-chronic kidney injury ([place])   Pressure injury of skin   Admitted From: home Disposition:  SNF   Recommendations for Outpatient Follow-up:  PCP: follow up in 1 week as well as with Dr Sharol Given   Follow-up Information     Christian Minion, MD. Schedule an appointment as soon as possible for a visit in 1 week(s).   Specialty: Orthopedic Surgery Contact information: [place] [address] [phone]         [name], DO. Schedule an appointment as soon as possible for a visit in 1 week(s).   Specialty: Family Medicine Contact information: [place] [address] [phone]                Discharge Instructions     Diet - low sodium heart healthy   Complete by: As directed    Discharge wound care:   Complete by: As directed    Compression stockings. And float the heel   Increase activity slowly   Complete by: As directed        Diet recommendation: Regular diet  Activity: The patient is advised to gradually reintroduce usual activities, as tolerated  Discharge Condition: stable  Code Status: Full code   History of present illness: As per the H and P dictated on admission, "Christian [name] is a 80 y.o. male with medical history significant for type 2 diabetes with peripheral neuropathy, PAD, history of transmetatarsal amputation of left foot, hypertension, CAD, CKD stage IIIa, GERD and hyperlipidemia who presents with right foot wound.   Pt needs sign language and daughter was at bedside to assist. Pt lives alone and children visits him  often. He noted some redness and swelling of his right leg about a week ago and then later had a fall which is frequent for him due to balance issues. He finally took off shoes and daughter noted large wound on bottom of foot. She thinks perhaps it has been there for a while and he has not noticed due to neuropathy. He denies any fever or chills. No nausea, vomiting or diarrhea."  Hospital Course:  Summary of his active problems in the hospital is as following.   1.  Right foot cellulitis Currently on IV vancomycin and Zosyn.  Changed to oral Augmentin and doxycycline. Complete 7 day course X-ray negative for osteomyelitis.  MRI negative for osteomyelitis as well as abscess. Orthopedic was consulted. Currently no indication for surgical intervention.  Monitor. Full weightbearing with postop shoes. Dr. Sharol Given recommend no surgical intervention for now and outpatient follow-up.  Compression stockings.   2.  Type II DM with hyperglycemia without long-term insulin use. Resume home meds   3.  Acute kidney injury on CKD 3A. Baseline serum creatinine 1.5. On presentation of 1.87. Treated with IV fluids.  Now improving.  IV fluids stopped due to concern for volume overload.  4.  HTN Blood pressure stable. Will continue current regimen.   5.  Acute on chronic diastolic CHF Patient appears to have some cough and some shortness of breath.  Chest x-ray unremarkable. Takes torsemide at  home currently on hold. Will transition to Lasix rather than torsemide . 6. Right heel stage 2 pressure ulcer POA Float heel  Pressure Injury 03/14/21 Heel Right Stage 2 -  Partial thickness loss of dermis presenting as a shallow open injury with a red, pink wound bed without slough. (Active)  03/14/21   Location: Heel  Location Orientation: Right  Staging: Stage 2 -  Partial thickness loss of dermis presenting as a shallow open injury with a red, pink wound bed without slough.  Wound Description (Comments):    Present on Admission: No     Pain control  - [place] Controlled Substance Reporting System database was reviewed. - 5 day supply was provided. - Patient was instructed, not to drive, operate heavy machinery, perform activities at heights, swimming or participation in water activities or provide baby sitting services while on Pain, Sleep and Anxiety Medications; until his outpatient Physician has advised to do so again.  - Also recommended to not to take more than prescribed Pain, Sleep and Anxiety Medications.  Patient was seen by physical therapy, who recommended SNF. On the day of the discharge the patient's vitals were stable, and no other new acute medical condition were reported. The patient was felt safe to be discharge at SNF with Therapy.  Consultants: Orthopedics  Procedures: none  DISCHARGE MEDICATION: Allergies as of 03/16/2021       Reactions   Lipitor [atorvastatin] Other (See Comments)   unknown        Medication List     STOP taking these medications    amLODipine 10 MG tablet Commonly known as: NORVASC   torsemide 20 MG tablet Commonly known as: DEMADEX       TAKE these medications    Accu-Chek SmartView test strip Generic drug: glucose blood USE 1 STRIP TO CHECK GLUCOSE TWICE DAILY AS DIRECTED   amoxicillin-clavulanate 875-125 MG tablet Commonly known as: AUGMENTIN Take 1 tablet by mouth every 12 (twelve) hours for 3 days.   doxycycline 100 MG tablet Commonly known as: VIBRA-TABS Take 1 tablet (100 mg total) by mouth 2 (two) times daily for 3 days.   escitalopram 5 MG tablet Commonly known as: LEXAPRO Take 5 mg by mouth daily.   furosemide 20 MG tablet Commonly known as: Lasix Take 1 tablet (20 mg total) by mouth daily.   HYDROcodone-acetaminophen 5-325 MG tablet Commonly known as: NORCO/VICODIN Take 1 tablet by mouth 3 (three) times daily as needed for moderate pain or severe pain.   isosorbide mononitrate 60 MG 24 hr  tablet Commonly known as: IMDUR Take 1 tablet (60 mg total) by mouth daily.   LORazepam 1 MG tablet Commonly known as: ATIVAN Take 1 mg by mouth daily as needed for anxiety or sleep.   nitroGLYCERIN 0.4 MG SL tablet Commonly known as: NITROSTAT Place 1 tablet (0.4 mg total) under the tongue every 5 (five) minutes as needed for chest pain. Please keep upcoming appt in May with Dr. Irish Lack. Thank you   polyethylene glycol 17 g packet Commonly known as: MIRALAX / GLYCOLAX Take 17 g by mouth daily as needed for mild constipation.   pravastatin 40 MG tablet Commonly known as: PRAVACHOL Take 40 mg by mouth daily.   tamsulosin 0.4 MG Caps capsule Commonly known as: FLOMAX Take 0.4 mg by mouth in the morning and at bedtime.   tiZANidine 4 MG tablet Commonly known as: ZANAFLEX Take 4 mg by mouth at bedtime.  Discharge Care Instructions  (From admission, onward)           Start     Ordered   03/16/21 0000  Discharge wound care:       Comments: Compression stockings. And float the heel   03/16/21 1215            Discharge Exam: [facility] Weights   03/11/21 2114  Weight: 83.9 kg   Vitals:   03/15/21 2053 03/16/21 0459  BP: 135/67 (!) 172/84  Pulse: 67 65  Resp: 17 18  Temp: 98.2 F (36.8 C) 97.9 F (36.6 C)  SpO2: 97% 99%   General: Appear in no distress, no Rash; Oral Mucosa Clear, moist. no Abnormal Neck Mass Or lumps, Conjunctiva normal  Cardiovascular: S1 and S2 Present, no Murmur Respiratory: good respiratory effort, Bilateral Air entry present and CTA, no Crackles, no wheezes Abdomen: Bowel Sound present, Soft and no tenderness Extremities: trace Pedal edema Neurology: alert and oriented to time, place, and person affect appropriate. no new focal deficit  The results of significant diagnostics from this hospitalization (including imaging, microbiology, ancillary and laboratory) are listed below for reference.    Significant Diagnostic  Studies: DG Chest 2 View  Result Date: 03/13/2021 CLINICAL DATA:  Shortness of breath.  Concern for infection EXAM: CHEST - 2 VIEW COMPARISON:  Chest radiograph and CT 09/14/2020. FINDINGS: Chronic elevation of left hemidiaphragm with basilar atelectasis or scarring. Stable heart size and mediastinal contours. Aortic atherosclerosis. There is mild peribronchial thickening. No acute airspace disease. No pneumothorax or pleural effusion. Gaseous gastric distension. Advanced bilateral shoulder arthropathy. IMPRESSION: 1. Chronic elevation of left hemidiaphragm with basilar atelectasis or scarring. 2. Mild peribronchial thickening may be related to bronchitis. No acute airspace disease. Electronically Signed   By: Keith Rake M.D.   On: 03/13/2021 15:34   MR FOOT RIGHT WO CONTRAST  Result Date: 03/12/2021 CLINICAL DATA:  Foot swelling. Diabetes. Plantar foot wound with cellulitis. Fall 5 days ago. EXAM: MRI OF THE RIGHT FOREFOOT WITHOUT CONTRAST TECHNIQUE: Multiplanar, multisequence MR imaging of the 03/11/2021 was performed. No intravenous contrast was administered. COMPARISON:  Radiographs of 03/11/2021 FINDINGS: Bones/Joint/Cartilage Bony fusion in the hindfoot including the talus, calcaneus, and cuboid. Notable midfoot degenerative spurring. Extensive deformities in the metatarsals with distal truncation and bony bridging between the metatarsals as well as chronic deformities of the proximal phalanges and fusion of the phalanges. Metal artifact in the remaining portion of the first metatarsal due to cerclage wires. Degenerative subcortical cyst formation along the distal tibia and distal fibula. No significant abnormal marrow edema characteristic of osteomyelitis or fracture. There is some low-level edema dorsally along the spurring between the navicular and the cuneiform is a as shown for example on image 20 series 9. Ligaments The Lisfranc ligament remains intact. Muscles and Tendons Mild peroneus longus  tendinopathy distal to the peroneal tubercle. Distal tibialis posterior tendinopathy. Focally thickened plantar fascia for example on image 18 series 9 raising the possibility of plantar fibromatosis. Soft tissues Dorsal subcutaneous edema in the foot extending into the toes. Mild cutaneous irregularity along the plantar foot likely corresponding to the known plantar foot wound, with low-grade subcutaneous edema in this vicinity favoring cellulitis. IMPRESSION: 1. Suspected cellulitis in the plantar forefoot and potentially dorsally in the forefoot. Cutaneous irregularity in the plantar forefoot likely from chronic wounds in this vicinity. No findings of osteomyelitis or drainable abscess. 2. Substantial thickening of the plantar fascia potentially from fasciitis or plantar fibromatosis. 3. Extensive bony fusion  and deformity in the hindfoot and forefoot, chronic. 4. Mild peroneus longus and tibialis posterior tendinopathy. Electronically Signed   By: Van Clines M.D.   On: 03/12/2021 09:16   DG Foot Complete Right  Result Date: 03/11/2021 CLINICAL DATA:  Right heel pain and possible infection. Worsening heel ulcer. EXAM: RIGHT FOOT COMPLETE - 3+ VIEW COMPARISON:  Foot radiograph 10/23/2019 FINDINGS: Chronic deformity of the distal metatarsals and digits, presumably postsurgical. K-wires within the first proximal phalanx. Similar appearance to prior exam. Moderate midfoot degenerative change. No visualized fracture. No evidence of erosion, focal bone abnormality or bone destruction. Chronic Achilles tendon enthesophyte. Dorsal soft tissue edema. Known heel ulcer is not well seen by radiograph. There is no soft tissue air or radiopaque foreign body. Vascular calcifications are seen. IMPRESSION: 1. No radiographic findings of osteomyelitis or acute osseous abnormality. 2. Dorsal soft tissue edema. No soft tissue air or radiopaque foreign body. 3. Chronic deformities of the distal metatarsals and digits,  presumably postsurgical. Moderate midfoot degenerative change. Electronically Signed   By: Keith Rake M.D.   On: 03/11/2021 22:02   VAS [place] ABI WITH/WO TBI  Result Date: 03/15/2021  LOWER EXTREMITY DOPPLER STUDY Patient Name:  SAVIOR HIMEBAUGH  Date of Exam:   03/15/2021 Medical Rec #: [id number]         Accession #:    [id number] Date of Birth: 22-Jul-1941        Patient Gender: M Patient Age:   79Y Exam Location:  [facility] Procedure:      VAS [place] ABI WITH/WO TBI Referring Phys: [id number] [facility] M Jara Feider --------------------------------------------------------------------------------  Indications: Ulceration. High Risk Factors: Hypertension, Diabetes.  Vascular Interventions: Left partial foot amputation. Limitations: Today's exam was limited due to patient unable to lie flat and              Right great toe contracture. Comparison Study: No prior studies. Performing Technologist: Carlos Levering RVT  Examination Guidelines: A complete evaluation includes at minimum, Doppler waveform signals and systolic blood pressure reading at the level of bilateral brachial, anterior tibial, and posterior tibial arteries, when vessel segments are accessible. Bilateral testing is considered an integral part of a complete examination. Photoelectric Plethysmograph (PPG) waveforms and toe systolic pressure readings are included as required and additional duplex testing as needed. Limited examinations for reoccurring indications may be performed as noted.  ABI Findings: +---------+------------------+-----+---------+---------------+ Right    Rt Pressure (mmHg)IndexWaveform Comment         +---------+------------------+-----+---------+---------------+ Brachial 117                    triphasic                +---------+------------------+-----+---------+---------------+ PTA      96                0.80 biphasic                 +---------+------------------+-----+---------+---------------+ DP       145                1.21 biphasic                 +---------+------------------+-----+---------+---------------+ Great Toe                                Toe contracture +---------+------------------+-----+---------+---------------+ +--------+------------------+-----+---------+---------------+ Left    Lt Pressure (mmHg)IndexWaveform Comment         +--------+------------------+-----+---------+---------------+  Brachial120                    triphasic                +--------+------------------+-----+---------+---------------+ PTA                                     Foot amputation +--------+------------------+-----+---------+---------------+ DP                                      Foot amputation +--------+------------------+-----+---------+---------------+ +-------+-----------+-----------+------------+------------+ ABI/TBIToday's ABIToday's TBIPrevious ABIPrevious TBI +-------+-----------+-----------+------------+------------+ Right  1.21                                           +-------+-----------+-----------+------------+------------+  Summary: Right: Resting right ankle-brachial index is within normal range. No evidence of significant right lower extremity arterial disease. Unable to obtain TBI due to great toe contracture. Left: Left partial foot amputation.  *See table(s) above for measurements and observations.  Electronically signed by Ruta Hinds MD on 03/15/2021 at 3:49:27 PM.    Final     Microbiology: Recent Results (from the past 240 hour(s))  Blood culture (routine x 2)     Status: None (Preliminary result)   Collection Time: 03/11/21 11:22 PM   Specimen: BLOOD  Result Value Ref Range Status   Specimen Description   Final    BLOOD BLOOD RIGHT HAND Performed at [facility], [place] [address] [place], [place] [address]    Special Requests   Final    BOTTLES DRAWN AEROBIC AND ANAEROBIC Blood Culture adequate volume Performed at [facility] [address] [place], [place] [address]    Culture   Final    NO GROWTH 4 DAYS Performed at [facility], [place] [address] [place], [place] [address]    Report Status PENDING  Incomplete  SARS CORONAVIRUS 2 (TAT 6-24 HRS) Nasopharyngeal Nasopharyngeal Swab     Status: None   Collection Time: 03/11/21 11:59 PM   Specimen: Nasopharyngeal Swab  Result Value Ref Range Status   SARS Coronavirus 2 NEGATIVE NEGATIVE Final    Comment: (NOTE) SARS-CoV-2 target nucleic acids are NOT DETECTED.  The SARS-CoV-2 RNA is generally detectable in upper and lower respiratory specimens during the acute phase of infection. Negative results do not preclude SARS-CoV-2 infection, do not rule out co-infections with other pathogens, and should not be used as the sole basis for treatment or other patient management decisions. Negative results must be combined with clinical observations, patient history, and epidemiological information. The expected result is Negative.  Fact Sheet for Patients: SugarRoll.be  Fact Sheet for Healthcare Providers: https://www.woods-mathews.com/  This test is not yet approved or cleared by the [place] FDA and  has been authorized for detection and/or diagnosis of SARS-CoV-2 by FDA under an Emergency Use Authorization (EUA). This EUA will remain  in effect (meaning this test can be used) for the duration of the COVID-19 declaration under Se ction 564(b)(1) of the Act, 21 U.S.C. section 360bbb-3(b)(1), unless the authorization is terminated or revoked sooner.  Performed at [facility], [place] [address], [place], [place] [address]   Blood culture (routine x 2)  Status: None (Preliminary result)   Collection Time: 03/12/21  6:09 AM   Specimen: Left Antecubital; Blood  Result Value Ref Range Status   Specimen Description   Final    LEFT ANTECUBITAL Performed at [facility] [address], [place], [place] [address]    Special Requests   Final    BOTTLES DRAWN AEROBIC AND ANAEROBIC Blood Culture results may not be optimal due to an excessive volume of blood received in culture bottles Performed at [facility] [address] [place], [place] [address]    Culture   Final    NO GROWTH 4 DAYS Performed at [facility], [place] [address] [place], [place] [address]    Report Status PENDING  Incomplete  Resp Panel by RT-PCR (Flu A&B, Covid) Nasopharyngeal Swab     Status: None   Collection Time: 03/16/21 10:26 AM   Specimen: Nasopharyngeal Swab; Nasopharyngeal(NP) swabs in vial transport medium  Result Value Ref Range Status   SARS Coronavirus 2 by RT PCR NEGATIVE NEGATIVE Final    Comment: (NOTE) SARS-CoV-2 target nucleic acids are NOT DETECTED.  The SARS-CoV-2 RNA is generally detectable in upper respiratory specimens during the acute phase of infection. The lowest concentration of SARS-CoV-2 viral copies this assay can detect is 138 copies/mL. A negative result does not preclude SARS-Cov-2 infection and should not be used as the sole basis for treatment or other patient management decisions. A negative result may occur with  improper specimen collection/handling, submission of specimen other than nasopharyngeal swab, presence of viral mutation(s) within the areas targeted by this assay, and inadequate number of viral copies(<138 copies/mL). A negative result must be combined with clinical observations, patient history, and epidemiological information. The expected result is Negative.  Fact Sheet for Patients:  EntrepreneurPulse.com.au  Fact Sheet for Healthcare Providers:  IncredibleEmployment.be  This test is no t yet approved or cleared by the [place] FDA and  has been authorized for detection and/or diagnosis of SARS-CoV-2 by FDA under an Emergency Use Authorization  (EUA). This EUA will remain  in effect (meaning this test can be used) for the duration of the COVID-19 declaration under Section 564(b)(1) of the Act, 21 U.S.C.section 360bbb-3(b)(1), unless the authorization is terminated  or revoked sooner.       Influenza A by PCR NEGATIVE NEGATIVE Final   Influenza B by PCR NEGATIVE NEGATIVE Final    Comment: (NOTE) The Xpert Xpress SARS-CoV-2/FLU/RSV plus assay is intended as an aid in the diagnosis of influenza from Nasopharyngeal swab specimens and should not be used as a sole basis for treatment. Nasal washings and aspirates are unacceptable for Xpert Xpress SARS-CoV-2/FLU/RSV testing.  Fact Sheet for Patients: EntrepreneurPulse.com.au  Fact Sheet for Healthcare Providers: IncredibleEmployment.be  This test is not yet approved or cleared by the [place] FDA and has been authorized for detection and/or diagnosis of SARS-CoV-2 by FDA under an Emergency Use Authorization (EUA). This EUA will remain in effect (meaning this test can be used) for the duration of the COVID-19 declaration under Section 564(b)(1) of the Act, 21 U.S.C. section 360bbb-3(b)(1), unless the authorization is terminated or revoked.  Performed at [facility], [place] [address] [place], [place] [address]      Labs: CBC: Recent Labs  Lab 03/11/21 2136 03/12/21 0620 03/13/21 0117  WBC 9.3 7.5 6.8  NEUTROABS 5.8  --   --   HGB 12.6* 11.8* 11.6*  HCT 38.5* 35.6* 36.1*  MCV 86.1  86.2 87.4  PLT 242 226 416   Basic Metabolic Panel: Recent Labs  Lab 03/11/21 2136 03/12/21 0620 03/13/21 0117 03/14/21 0054 03/15/21 1312  NA 132* 137 138 136 137  K 4.0 3.2* 3.6 3.7 3.8  CL 95* 98 99 101 103  CO2 28 29 29 29 27   GLUCOSE 315* 127* 101* 104* 171*  BUN 50* 43* 26* 22 23  CREATININE 1.87* 1.55* 1.38* 1.34* 1.34*  CALCIUM 8.6* 8.5* 8.5* 8.3* 8.3*  MG  --   --  1.7  --   --   PHOS  --   --  3.3  --   --    Liver  Function Tests: Recent Labs  Lab 03/13/21 0117  AST 25  ALT 27  ALKPHOS 167*  BILITOT 1.0  PROT 6.0*  ALBUMIN 2.6*   CBG: Recent Labs  Lab 03/15/21 1207 03/15/21 1634 03/15/21 2050 03/16/21 0806 03/16/21 1140  GLUCAP 167* 162* 195* 123* 156*    Time spent: 35 minutes  Signed:  Berle Mull  [facility]  03/16/2021

## 2021-03-16 NOTE — TOC Transition Note (Signed)
Transition of Care [facility]) - CM/SW Discharge Note   Patient Details  Name: Christian [name] MRN: [id number] Date of Birth: December 07, 1940  [facility] of [facility]) CM/SW Contact:  Joanne Chars, LCSW Phone Number: 03/16/2021, 12:43 PM   Clinical Narrative:   Pt discharging to [facility] Room 212.  RN call [phone] for report.    0900: CSW spoke to daughter Crystal to confirm bed choice of [facility], [facility] at [facility] is able to accept.  [employer] authorization received: [id number], 3days approved starting 7/6, review 7/8 with Esperanza Heir.  CSW used written communication with pt who also confirms agreement with plan.      Final next level of care: Skilled Nursing Facility Barriers to Discharge: Barriers Resolved   Patient Goals and CMS Choice Patient states their goals for this hospitalization and ongoing recovery are:: to return to home CMS Medicare.gov Compare Post Acute Care list provided to:: Patient Choice offered to / list presented to : Patient  Discharge Placement              Patient chooses bed at:  ([facility]) Patient to be transferred to facility by: [facility] Name of family member notified: daughter Crystal Patient and family notified of of transfer: 03/16/21  Discharge Plan and Services   Discharge Planning Services: CM Consult              DME Agency: NA       HH Arranged: NA          Social Determinants of Health ([place]) Interventions     Readmission Risk Interventions No flowsheet data found.

## 2021-03-16 NOTE — Progress Notes (Signed)
Patient discharged to home via [facility] with all belongings. Discharge instructions given to patient via interpreter. Patient verbalized understanding of all teaching.

## 2021-03-16 NOTE — Progress Notes (Addendum)
Physical Therapy Treatment Patient Details Name: Christian [name] MRN: [id number] DOB: [birth year] Today's Date: 03/16/2021    History of Present Illness Pt admit 7/1 presenting with complaints of worsening right foot wound.  Appears to have cellulitis without any evidence of osteomyelitis or abscess that required surgical intervention. Past medical history of type II DM, neuropathy, PAD, transmetatarsal amputation of left foot, HTN, CAD, CKD 3A, GERD, HLD.    PT Comments    Focus of session OOB to recliner to eat his lunch.  Pt reports foot is too painful to progress gt training. Pt is now agreeable for snf placement to improve management, increase strength and decrease level of assistance needed to function.  Will inform supervising PT to update recommendations at this time.    ASL Interpreter used Christian [name] ID # [id number]    Follow Up Recommendations  SNF     Equipment Recommendations       Recommendations for Other Services       Precautions / Restrictions Precautions Precautions: Fall Required Braces or Orthoses: Other Brace Other Brace: post op shoe Restrictions Weight Bearing Restrictions: Yes RLE Weight Bearing: Weight bearing as tolerated Other Position/Activity Restrictions: post op shoe with heel support (black)    Mobility  Bed Mobility Overal bed mobility: Needs Assistance Bed Mobility: Supine to Sit     Supine to sit: Min guard     General bed mobility comments: Cues for safety.    Transfers Overall transfer level: Needs assistance Equipment used: Rolling walker (2 wheeled) Transfers: Sit to/from Stand Sit to Stand: Min guard;From elevated surface         General transfer comment: pt with flexed posture and increased time  Ambulation/Gait Ambulation/Gait assistance: Min guard Gait Distance (Feet): 6 Feet Assistive device: Rolling walker (2 wheeled) Gait Pattern/deviations: Step-through pattern;Decreased stride length;Decreased stance time -  right;Decreased weight shift to right;Decreased dorsiflexion - right;Antalgic;Wide base of support     General Gait Details: Pt was adamant to move from bed to recliner chair to eat his lunch.  He reports he no longer needed interpreter so difficult to progress gt.   Stairs             Wheelchair Mobility    Modified Rankin (Stroke Patients Only)       Balance Overall balance assessment: Needs assistance Sitting-balance support: No upper extremity supported;Feet supported Sitting balance-[name] Scale: Fair       Standing balance-[name] Scale: Poor Standing balance comment: pt was able to stand with bil UE support and RW                            Cognition Arousal/Alertness: Awake/alert Behavior During Therapy: WFL for tasks assessed/performed Overall Cognitive Status: Within Functional Limits for tasks assessed                                        Exercises      General Comments        Pertinent Vitals/Pain Pain Assessment: No/denies pain    Home Living                      Prior Function            PT Goals (current goals can now be found in the care plan section) Acute Rehab PT Goals Patient Stated Goal: to  go home Potential to Achieve Goals: Fair Progress towards PT goals: Progressing toward goals    Frequency    Min 3X/week      PT Plan Discharge plan needs to be updated    Co-evaluation              AM-PAC PT "6 Clicks" Mobility   Outcome Measure  Help needed turning from your back to your side while in a flat bed without using bedrails?: A Little Help needed moving from lying on your back to sitting on the side of a flat bed without using bedrails?: A Little Help needed moving to and from a bed to a chair (including a wheelchair)?: A Little Help needed standing up from a chair using your arms (e.g., wheelchair or bedside chair)?: A Little Help needed to walk in hospital room?: A  Little Help needed climbing 3-5 steps with a railing? : A Little 6 Click Score: 18    End of Session Equipment Utilized During Treatment: Gait belt Activity Tolerance: Patient limited by fatigue Patient left: in chair;with call bell/phone within reach;with chair alarm set Nurse Communication: Mobility status PT Visit Diagnosis: Muscle weakness (generalized) (M62.81)     Time: 1350-1407 PT Time Calculation (min) (ACUTE ONLY): 17 min  Charges:  $Therapeutic Activity: 8-22 mins                     Christian [name] , PTA Acute Rehabilitation Services Pager [phone] Office [phone]    Christian [name] Christian [name] 03/16/2021, 2:24 PM

## 2021-03-16 NOTE — Care Management Important Message (Signed)
Important Message  Patient Details  Name: Christian [name] MRN: [id number] Date of Birth: 01-09-1941   Medicare Important Message Given:  Yes     Fatimata Talsma P Dailey Alberson 03/16/2021, 1:30 PM

## 2021-03-17 LAB — CULTURE, BLOOD (ROUTINE X 2)
Culture: NO GROWTH
Culture: NO GROWTH
Special Requests: ADEQUATE

## 2021-03-18 DIAGNOSIS — M24542 Contracture, left hand: Secondary | ICD-10-CM | POA: Diagnosis not present

## 2021-03-18 DIAGNOSIS — M24541 Contracture, right hand: Secondary | ICD-10-CM | POA: Diagnosis not present

## 2021-03-18 DIAGNOSIS — L03115 Cellulitis of right lower limb: Secondary | ICD-10-CM | POA: Diagnosis not present

## 2021-03-18 DIAGNOSIS — S91301D Unspecified open wound, right foot, subsequent encounter: Secondary | ICD-10-CM | POA: Diagnosis not present

## 2021-03-18 DIAGNOSIS — N1831 Chronic kidney disease, stage 3a: Secondary | ICD-10-CM | POA: Diagnosis not present

## 2021-03-18 DIAGNOSIS — Z89432 Acquired absence of left foot: Secondary | ICD-10-CM | POA: Diagnosis not present

## 2021-03-18 DIAGNOSIS — E1149 Type 2 diabetes mellitus with other diabetic neurological complication: Secondary | ICD-10-CM | POA: Diagnosis not present

## 2021-03-18 DIAGNOSIS — E1122 Type 2 diabetes mellitus with diabetic chronic kidney disease: Secondary | ICD-10-CM | POA: Diagnosis not present

## 2021-03-18 DIAGNOSIS — D649 Anemia, unspecified: Secondary | ICD-10-CM | POA: Diagnosis not present

## 2021-03-18 DIAGNOSIS — Z794 Long term (current) use of insulin: Secondary | ICD-10-CM | POA: Diagnosis not present

## 2021-03-21 DIAGNOSIS — L89620 Pressure ulcer of left heel, unstageable: Secondary | ICD-10-CM | POA: Diagnosis not present

## 2021-03-21 DIAGNOSIS — L03115 Cellulitis of right lower limb: Secondary | ICD-10-CM | POA: Diagnosis not present

## 2021-03-28 DIAGNOSIS — E1151 Type 2 diabetes mellitus with diabetic peripheral angiopathy without gangrene: Secondary | ICD-10-CM | POA: Diagnosis not present

## 2021-03-28 DIAGNOSIS — N3281 Overactive bladder: Secondary | ICD-10-CM | POA: Diagnosis not present

## 2021-03-28 DIAGNOSIS — K219 Gastro-esophageal reflux disease without esophagitis: Secondary | ICD-10-CM | POA: Diagnosis not present

## 2021-03-28 DIAGNOSIS — E785 Hyperlipidemia, unspecified: Secondary | ICD-10-CM | POA: Diagnosis not present

## 2021-03-28 DIAGNOSIS — F32A Depression, unspecified: Secondary | ICD-10-CM | POA: Diagnosis not present

## 2021-03-28 DIAGNOSIS — I252 Old myocardial infarction: Secondary | ICD-10-CM | POA: Diagnosis not present

## 2021-03-28 DIAGNOSIS — D631 Anemia in chronic kidney disease: Secondary | ICD-10-CM | POA: Diagnosis not present

## 2021-03-28 DIAGNOSIS — M19041 Primary osteoarthritis, right hand: Secondary | ICD-10-CM | POA: Diagnosis not present

## 2021-03-28 DIAGNOSIS — D509 Iron deficiency anemia, unspecified: Secondary | ICD-10-CM | POA: Diagnosis not present

## 2021-03-28 DIAGNOSIS — E1142 Type 2 diabetes mellitus with diabetic polyneuropathy: Secondary | ICD-10-CM | POA: Diagnosis not present

## 2021-03-28 DIAGNOSIS — R338 Other retention of urine: Secondary | ICD-10-CM | POA: Diagnosis not present

## 2021-03-28 DIAGNOSIS — L03115 Cellulitis of right lower limb: Secondary | ICD-10-CM | POA: Diagnosis not present

## 2021-03-28 DIAGNOSIS — E1122 Type 2 diabetes mellitus with diabetic chronic kidney disease: Secondary | ICD-10-CM | POA: Diagnosis not present

## 2021-03-28 DIAGNOSIS — M47812 Spondylosis without myelopathy or radiculopathy, cervical region: Secondary | ICD-10-CM | POA: Diagnosis not present

## 2021-03-28 DIAGNOSIS — I700 Atherosclerosis of aorta: Secondary | ICD-10-CM | POA: Diagnosis not present

## 2021-03-28 DIAGNOSIS — K5790 Diverticulosis of intestine, part unspecified, without perforation or abscess without bleeding: Secondary | ICD-10-CM | POA: Diagnosis not present

## 2021-03-28 DIAGNOSIS — N1831 Chronic kidney disease, stage 3a: Secondary | ICD-10-CM | POA: Diagnosis not present

## 2021-03-28 DIAGNOSIS — M24542 Contracture, left hand: Secondary | ICD-10-CM | POA: Diagnosis not present

## 2021-03-28 DIAGNOSIS — I130 Hypertensive heart and chronic kidney disease with heart failure and stage 1 through stage 4 chronic kidney disease, or unspecified chronic kidney disease: Secondary | ICD-10-CM | POA: Diagnosis not present

## 2021-03-28 DIAGNOSIS — I2510 Atherosclerotic heart disease of native coronary artery without angina pectoris: Secondary | ICD-10-CM | POA: Diagnosis not present

## 2021-03-28 DIAGNOSIS — H913 Deaf nonspeaking, not elsewhere classified: Secondary | ICD-10-CM | POA: Diagnosis not present

## 2021-03-28 DIAGNOSIS — M24541 Contracture, right hand: Secondary | ICD-10-CM | POA: Diagnosis not present

## 2021-03-28 DIAGNOSIS — I5022 Chronic systolic (congestive) heart failure: Secondary | ICD-10-CM | POA: Diagnosis not present

## 2021-03-29 ENCOUNTER — Ambulatory Visit: Payer: Medicare Other | Admitting: Orthopedic Surgery

## 2021-03-29 DIAGNOSIS — N1831 Chronic kidney disease, stage 3a: Secondary | ICD-10-CM | POA: Diagnosis not present

## 2021-03-29 DIAGNOSIS — M47812 Spondylosis without myelopathy or radiculopathy, cervical region: Secondary | ICD-10-CM | POA: Diagnosis not present

## 2021-03-29 DIAGNOSIS — F32A Depression, unspecified: Secondary | ICD-10-CM | POA: Diagnosis not present

## 2021-03-29 DIAGNOSIS — E1151 Type 2 diabetes mellitus with diabetic peripheral angiopathy without gangrene: Secondary | ICD-10-CM | POA: Diagnosis not present

## 2021-03-29 DIAGNOSIS — M24541 Contracture, right hand: Secondary | ICD-10-CM | POA: Diagnosis not present

## 2021-03-29 DIAGNOSIS — K5790 Diverticulosis of intestine, part unspecified, without perforation or abscess without bleeding: Secondary | ICD-10-CM | POA: Diagnosis not present

## 2021-03-29 DIAGNOSIS — K219 Gastro-esophageal reflux disease without esophagitis: Secondary | ICD-10-CM | POA: Diagnosis not present

## 2021-03-29 DIAGNOSIS — I2510 Atherosclerotic heart disease of native coronary artery without angina pectoris: Secondary | ICD-10-CM | POA: Diagnosis not present

## 2021-03-29 DIAGNOSIS — M19041 Primary osteoarthritis, right hand: Secondary | ICD-10-CM | POA: Diagnosis not present

## 2021-03-29 DIAGNOSIS — I130 Hypertensive heart and chronic kidney disease with heart failure and stage 1 through stage 4 chronic kidney disease, or unspecified chronic kidney disease: Secondary | ICD-10-CM | POA: Diagnosis not present

## 2021-03-29 DIAGNOSIS — I252 Old myocardial infarction: Secondary | ICD-10-CM | POA: Diagnosis not present

## 2021-03-29 DIAGNOSIS — E1142 Type 2 diabetes mellitus with diabetic polyneuropathy: Secondary | ICD-10-CM | POA: Diagnosis not present

## 2021-03-29 DIAGNOSIS — I700 Atherosclerosis of aorta: Secondary | ICD-10-CM | POA: Diagnosis not present

## 2021-03-29 DIAGNOSIS — E785 Hyperlipidemia, unspecified: Secondary | ICD-10-CM | POA: Diagnosis not present

## 2021-03-29 DIAGNOSIS — D509 Iron deficiency anemia, unspecified: Secondary | ICD-10-CM | POA: Diagnosis not present

## 2021-03-29 DIAGNOSIS — I5022 Chronic systolic (congestive) heart failure: Secondary | ICD-10-CM | POA: Diagnosis not present

## 2021-03-29 DIAGNOSIS — N3281 Overactive bladder: Secondary | ICD-10-CM | POA: Diagnosis not present

## 2021-03-29 DIAGNOSIS — L03115 Cellulitis of right lower limb: Secondary | ICD-10-CM | POA: Diagnosis not present

## 2021-03-29 DIAGNOSIS — H913 Deaf nonspeaking, not elsewhere classified: Secondary | ICD-10-CM | POA: Diagnosis not present

## 2021-03-29 DIAGNOSIS — E1122 Type 2 diabetes mellitus with diabetic chronic kidney disease: Secondary | ICD-10-CM | POA: Diagnosis not present

## 2021-03-29 DIAGNOSIS — D631 Anemia in chronic kidney disease: Secondary | ICD-10-CM | POA: Diagnosis not present

## 2021-03-29 DIAGNOSIS — R338 Other retention of urine: Secondary | ICD-10-CM | POA: Diagnosis not present

## 2021-03-29 DIAGNOSIS — M24542 Contracture, left hand: Secondary | ICD-10-CM | POA: Diagnosis not present

## 2021-03-31 DIAGNOSIS — I130 Hypertensive heart and chronic kidney disease with heart failure and stage 1 through stage 4 chronic kidney disease, or unspecified chronic kidney disease: Secondary | ICD-10-CM | POA: Diagnosis not present

## 2021-03-31 DIAGNOSIS — M24541 Contracture, right hand: Secondary | ICD-10-CM | POA: Diagnosis not present

## 2021-03-31 DIAGNOSIS — L03115 Cellulitis of right lower limb: Secondary | ICD-10-CM | POA: Diagnosis not present

## 2021-03-31 DIAGNOSIS — E1142 Type 2 diabetes mellitus with diabetic polyneuropathy: Secondary | ICD-10-CM | POA: Diagnosis not present

## 2021-03-31 DIAGNOSIS — I700 Atherosclerosis of aorta: Secondary | ICD-10-CM | POA: Diagnosis not present

## 2021-03-31 DIAGNOSIS — M47812 Spondylosis without myelopathy or radiculopathy, cervical region: Secondary | ICD-10-CM | POA: Diagnosis not present

## 2021-03-31 DIAGNOSIS — M24542 Contracture, left hand: Secondary | ICD-10-CM | POA: Diagnosis not present

## 2021-03-31 DIAGNOSIS — E1122 Type 2 diabetes mellitus with diabetic chronic kidney disease: Secondary | ICD-10-CM | POA: Diagnosis not present

## 2021-03-31 DIAGNOSIS — R338 Other retention of urine: Secondary | ICD-10-CM | POA: Diagnosis not present

## 2021-03-31 DIAGNOSIS — N3281 Overactive bladder: Secondary | ICD-10-CM | POA: Diagnosis not present

## 2021-03-31 DIAGNOSIS — D631 Anemia in chronic kidney disease: Secondary | ICD-10-CM | POA: Diagnosis not present

## 2021-03-31 DIAGNOSIS — H913 Deaf nonspeaking, not elsewhere classified: Secondary | ICD-10-CM | POA: Diagnosis not present

## 2021-03-31 DIAGNOSIS — K219 Gastro-esophageal reflux disease without esophagitis: Secondary | ICD-10-CM | POA: Diagnosis not present

## 2021-03-31 DIAGNOSIS — E785 Hyperlipidemia, unspecified: Secondary | ICD-10-CM | POA: Diagnosis not present

## 2021-03-31 DIAGNOSIS — I2510 Atherosclerotic heart disease of native coronary artery without angina pectoris: Secondary | ICD-10-CM | POA: Diagnosis not present

## 2021-03-31 DIAGNOSIS — K5790 Diverticulosis of intestine, part unspecified, without perforation or abscess without bleeding: Secondary | ICD-10-CM | POA: Diagnosis not present

## 2021-03-31 DIAGNOSIS — I5022 Chronic systolic (congestive) heart failure: Secondary | ICD-10-CM | POA: Diagnosis not present

## 2021-03-31 DIAGNOSIS — M19041 Primary osteoarthritis, right hand: Secondary | ICD-10-CM | POA: Diagnosis not present

## 2021-03-31 DIAGNOSIS — N1831 Chronic kidney disease, stage 3a: Secondary | ICD-10-CM | POA: Diagnosis not present

## 2021-03-31 DIAGNOSIS — E1151 Type 2 diabetes mellitus with diabetic peripheral angiopathy without gangrene: Secondary | ICD-10-CM | POA: Diagnosis not present

## 2021-03-31 DIAGNOSIS — D509 Iron deficiency anemia, unspecified: Secondary | ICD-10-CM | POA: Diagnosis not present

## 2021-03-31 DIAGNOSIS — F32A Depression, unspecified: Secondary | ICD-10-CM | POA: Diagnosis not present

## 2021-03-31 DIAGNOSIS — I252 Old myocardial infarction: Secondary | ICD-10-CM | POA: Diagnosis not present

## 2021-04-05 DIAGNOSIS — D509 Iron deficiency anemia, unspecified: Secondary | ICD-10-CM | POA: Diagnosis not present

## 2021-04-05 DIAGNOSIS — R338 Other retention of urine: Secondary | ICD-10-CM | POA: Diagnosis not present

## 2021-04-05 DIAGNOSIS — E1122 Type 2 diabetes mellitus with diabetic chronic kidney disease: Secondary | ICD-10-CM | POA: Diagnosis not present

## 2021-04-05 DIAGNOSIS — E1142 Type 2 diabetes mellitus with diabetic polyneuropathy: Secondary | ICD-10-CM | POA: Diagnosis not present

## 2021-04-05 DIAGNOSIS — I252 Old myocardial infarction: Secondary | ICD-10-CM | POA: Diagnosis not present

## 2021-04-05 DIAGNOSIS — D631 Anemia in chronic kidney disease: Secondary | ICD-10-CM | POA: Diagnosis not present

## 2021-04-05 DIAGNOSIS — M19041 Primary osteoarthritis, right hand: Secondary | ICD-10-CM | POA: Diagnosis not present

## 2021-04-05 DIAGNOSIS — M24541 Contracture, right hand: Secondary | ICD-10-CM | POA: Diagnosis not present

## 2021-04-05 DIAGNOSIS — I130 Hypertensive heart and chronic kidney disease with heart failure and stage 1 through stage 4 chronic kidney disease, or unspecified chronic kidney disease: Secondary | ICD-10-CM | POA: Diagnosis not present

## 2021-04-05 DIAGNOSIS — M47812 Spondylosis without myelopathy or radiculopathy, cervical region: Secondary | ICD-10-CM | POA: Diagnosis not present

## 2021-04-05 DIAGNOSIS — L03115 Cellulitis of right lower limb: Secondary | ICD-10-CM | POA: Diagnosis not present

## 2021-04-05 DIAGNOSIS — K5790 Diverticulosis of intestine, part unspecified, without perforation or abscess without bleeding: Secondary | ICD-10-CM | POA: Diagnosis not present

## 2021-04-05 DIAGNOSIS — K219 Gastro-esophageal reflux disease without esophagitis: Secondary | ICD-10-CM | POA: Diagnosis not present

## 2021-04-05 DIAGNOSIS — F32A Depression, unspecified: Secondary | ICD-10-CM | POA: Diagnosis not present

## 2021-04-05 DIAGNOSIS — N1831 Chronic kidney disease, stage 3a: Secondary | ICD-10-CM | POA: Diagnosis not present

## 2021-04-05 DIAGNOSIS — M24542 Contracture, left hand: Secondary | ICD-10-CM | POA: Diagnosis not present

## 2021-04-05 DIAGNOSIS — I5022 Chronic systolic (congestive) heart failure: Secondary | ICD-10-CM | POA: Diagnosis not present

## 2021-04-05 DIAGNOSIS — I2510 Atherosclerotic heart disease of native coronary artery without angina pectoris: Secondary | ICD-10-CM | POA: Diagnosis not present

## 2021-04-05 DIAGNOSIS — E1151 Type 2 diabetes mellitus with diabetic peripheral angiopathy without gangrene: Secondary | ICD-10-CM | POA: Diagnosis not present

## 2021-04-05 DIAGNOSIS — I700 Atherosclerosis of aorta: Secondary | ICD-10-CM | POA: Diagnosis not present

## 2021-04-05 DIAGNOSIS — N3281 Overactive bladder: Secondary | ICD-10-CM | POA: Diagnosis not present

## 2021-04-05 DIAGNOSIS — E785 Hyperlipidemia, unspecified: Secondary | ICD-10-CM | POA: Diagnosis not present

## 2021-04-05 DIAGNOSIS — H913 Deaf nonspeaking, not elsewhere classified: Secondary | ICD-10-CM | POA: Diagnosis not present

## 2021-04-06 ENCOUNTER — Ambulatory Visit: Payer: Medicare Other | Admitting: Family

## 2021-04-06 DIAGNOSIS — N1831 Chronic kidney disease, stage 3a: Secondary | ICD-10-CM | POA: Diagnosis not present

## 2021-04-06 DIAGNOSIS — M47812 Spondylosis without myelopathy or radiculopathy, cervical region: Secondary | ICD-10-CM | POA: Diagnosis not present

## 2021-04-06 DIAGNOSIS — E1142 Type 2 diabetes mellitus with diabetic polyneuropathy: Secondary | ICD-10-CM | POA: Diagnosis not present

## 2021-04-06 DIAGNOSIS — E785 Hyperlipidemia, unspecified: Secondary | ICD-10-CM | POA: Diagnosis not present

## 2021-04-06 DIAGNOSIS — M19041 Primary osteoarthritis, right hand: Secondary | ICD-10-CM | POA: Diagnosis not present

## 2021-04-06 DIAGNOSIS — N3281 Overactive bladder: Secondary | ICD-10-CM | POA: Diagnosis not present

## 2021-04-06 DIAGNOSIS — D631 Anemia in chronic kidney disease: Secondary | ICD-10-CM | POA: Diagnosis not present

## 2021-04-06 DIAGNOSIS — K5790 Diverticulosis of intestine, part unspecified, without perforation or abscess without bleeding: Secondary | ICD-10-CM | POA: Diagnosis not present

## 2021-04-06 DIAGNOSIS — F32A Depression, unspecified: Secondary | ICD-10-CM | POA: Diagnosis not present

## 2021-04-06 DIAGNOSIS — D509 Iron deficiency anemia, unspecified: Secondary | ICD-10-CM | POA: Diagnosis not present

## 2021-04-06 DIAGNOSIS — H913 Deaf nonspeaking, not elsewhere classified: Secondary | ICD-10-CM | POA: Diagnosis not present

## 2021-04-06 DIAGNOSIS — I5022 Chronic systolic (congestive) heart failure: Secondary | ICD-10-CM | POA: Diagnosis not present

## 2021-04-06 DIAGNOSIS — R338 Other retention of urine: Secondary | ICD-10-CM | POA: Diagnosis not present

## 2021-04-06 DIAGNOSIS — I130 Hypertensive heart and chronic kidney disease with heart failure and stage 1 through stage 4 chronic kidney disease, or unspecified chronic kidney disease: Secondary | ICD-10-CM | POA: Diagnosis not present

## 2021-04-06 DIAGNOSIS — M24541 Contracture, right hand: Secondary | ICD-10-CM | POA: Diagnosis not present

## 2021-04-06 DIAGNOSIS — L03115 Cellulitis of right lower limb: Secondary | ICD-10-CM | POA: Diagnosis not present

## 2021-04-06 DIAGNOSIS — M24542 Contracture, left hand: Secondary | ICD-10-CM | POA: Diagnosis not present

## 2021-04-06 DIAGNOSIS — I700 Atherosclerosis of aorta: Secondary | ICD-10-CM | POA: Diagnosis not present

## 2021-04-06 DIAGNOSIS — I252 Old myocardial infarction: Secondary | ICD-10-CM | POA: Diagnosis not present

## 2021-04-06 DIAGNOSIS — I2510 Atherosclerotic heart disease of native coronary artery without angina pectoris: Secondary | ICD-10-CM | POA: Diagnosis not present

## 2021-04-06 DIAGNOSIS — E1151 Type 2 diabetes mellitus with diabetic peripheral angiopathy without gangrene: Secondary | ICD-10-CM | POA: Diagnosis not present

## 2021-04-06 DIAGNOSIS — E1122 Type 2 diabetes mellitus with diabetic chronic kidney disease: Secondary | ICD-10-CM | POA: Diagnosis not present

## 2021-04-06 DIAGNOSIS — K219 Gastro-esophageal reflux disease without esophagitis: Secondary | ICD-10-CM | POA: Diagnosis not present

## 2021-04-12 DIAGNOSIS — N3281 Overactive bladder: Secondary | ICD-10-CM | POA: Diagnosis not present

## 2021-04-12 DIAGNOSIS — M47812 Spondylosis without myelopathy or radiculopathy, cervical region: Secondary | ICD-10-CM | POA: Diagnosis not present

## 2021-04-12 DIAGNOSIS — M24542 Contracture, left hand: Secondary | ICD-10-CM | POA: Diagnosis not present

## 2021-04-12 DIAGNOSIS — M19041 Primary osteoarthritis, right hand: Secondary | ICD-10-CM | POA: Diagnosis not present

## 2021-04-12 DIAGNOSIS — F32A Depression, unspecified: Secondary | ICD-10-CM | POA: Diagnosis not present

## 2021-04-12 DIAGNOSIS — E1142 Type 2 diabetes mellitus with diabetic polyneuropathy: Secondary | ICD-10-CM | POA: Diagnosis not present

## 2021-04-12 DIAGNOSIS — D631 Anemia in chronic kidney disease: Secondary | ICD-10-CM | POA: Diagnosis not present

## 2021-04-12 DIAGNOSIS — R338 Other retention of urine: Secondary | ICD-10-CM | POA: Diagnosis not present

## 2021-04-12 DIAGNOSIS — I700 Atherosclerosis of aorta: Secondary | ICD-10-CM | POA: Diagnosis not present

## 2021-04-12 DIAGNOSIS — E785 Hyperlipidemia, unspecified: Secondary | ICD-10-CM | POA: Diagnosis not present

## 2021-04-12 DIAGNOSIS — L03115 Cellulitis of right lower limb: Secondary | ICD-10-CM | POA: Diagnosis not present

## 2021-04-12 DIAGNOSIS — I252 Old myocardial infarction: Secondary | ICD-10-CM | POA: Diagnosis not present

## 2021-04-12 DIAGNOSIS — D509 Iron deficiency anemia, unspecified: Secondary | ICD-10-CM | POA: Diagnosis not present

## 2021-04-12 DIAGNOSIS — E1122 Type 2 diabetes mellitus with diabetic chronic kidney disease: Secondary | ICD-10-CM | POA: Diagnosis not present

## 2021-04-12 DIAGNOSIS — I5022 Chronic systolic (congestive) heart failure: Secondary | ICD-10-CM | POA: Diagnosis not present

## 2021-04-12 DIAGNOSIS — M24541 Contracture, right hand: Secondary | ICD-10-CM | POA: Diagnosis not present

## 2021-04-12 DIAGNOSIS — I130 Hypertensive heart and chronic kidney disease with heart failure and stage 1 through stage 4 chronic kidney disease, or unspecified chronic kidney disease: Secondary | ICD-10-CM | POA: Diagnosis not present

## 2021-04-12 DIAGNOSIS — E1151 Type 2 diabetes mellitus with diabetic peripheral angiopathy without gangrene: Secondary | ICD-10-CM | POA: Diagnosis not present

## 2021-04-12 DIAGNOSIS — H913 Deaf nonspeaking, not elsewhere classified: Secondary | ICD-10-CM | POA: Diagnosis not present

## 2021-04-12 DIAGNOSIS — N1831 Chronic kidney disease, stage 3a: Secondary | ICD-10-CM | POA: Diagnosis not present

## 2021-04-12 DIAGNOSIS — K5790 Diverticulosis of intestine, part unspecified, without perforation or abscess without bleeding: Secondary | ICD-10-CM | POA: Diagnosis not present

## 2021-04-12 DIAGNOSIS — K219 Gastro-esophageal reflux disease without esophagitis: Secondary | ICD-10-CM | POA: Diagnosis not present

## 2021-04-12 DIAGNOSIS — I2510 Atherosclerotic heart disease of native coronary artery without angina pectoris: Secondary | ICD-10-CM | POA: Diagnosis not present

## 2021-04-13 ENCOUNTER — Encounter: Payer: Self-pay | Admitting: Family

## 2021-04-13 ENCOUNTER — Ambulatory Visit: Payer: Medicare Other | Admitting: Family

## 2021-04-13 ENCOUNTER — Telehealth: Payer: Self-pay | Admitting: Orthopedic Surgery

## 2021-04-13 DIAGNOSIS — Z89432 Acquired absence of left foot: Secondary | ICD-10-CM

## 2021-04-13 DIAGNOSIS — L03115 Cellulitis of right lower limb: Secondary | ICD-10-CM | POA: Diagnosis not present

## 2021-04-13 DIAGNOSIS — M14671 Charcot's joint, right ankle and foot: Secondary | ICD-10-CM

## 2021-04-13 MED ORDER — MUPIROCIN CALCIUM 2 % EX CREA: 1.0000 "application " | TOPICAL_CREAM | Freq: Two times a day (BID) | CUTANEOUS | 0 refills | Status: DC

## 2021-04-13 NOTE — Progress Notes (Signed)
Office Visit Note   Patient: Christian [name]           Date of Birth: 1940-11-18           MRN: [id number] Visit Date: 04/13/2021              Requested by: [name], DO [place],  [place] [id number] PCP: [name], DO  No chief complaint on file.     HPI: The patient is a 80 year old gentleman who is seen today in hospitalization follow-up.  He unfortunately was hospitalized for cellulitis with ulcer to his right heel.  He is status post transmetatarsal amputation on the left.  He does have a history of diabetic insensate neuropathy with Charcot collapse.  He had been having nursing assistance with home health since hospitalization discharge.  Hospitalization was in the beginning of July.  Denies fevers chills.  He is ambulates with a rolling walker.  He has had inserts and diabetic shoes in the past currently these are all worn out he is wearing extra-depth shoes today without any orthotics or inserts his daughter reports he has had several falls due to inadequate shoe wear  Assessment & Plan: Visit Diagnoses: No diagnosis found.  Plan: Encouraged the right foot has completely healed the cellulitis is resolved.  On his left foot he does continue to have an ulcer he will continue his current wound care with mupirocin dressing changes with home health nursing will continue following.  Did provide an order for custom orthotic with a carbon fiber plate and spacer for his left foot he will use over-the-counter shoe wear  Follow-Up Instructions: No follow-ups on file.   Ortho Exam  Patient is alert, oriented, no adenopathy, well-dressed, normal affect, normal respiratory effort. On examination of the right foot there is trace edema there is no erythema no cellulitis no peeling skin there is no open ulceration no callus no sign of infection or wound.  To his left foot he does have 1 ulcerative area beneath the transmetatarsal amputation under the lateral  column this is just 5 mm in diameter today completely filled in with granulation there is no surrounding erythema maceration no drainage no sign of infection has a palpable dorsalis pedis pulse.  Imaging: No results found. No images are attached to the encounter.  Labs: Lab Results  Component Value Date   HGBA1C 7.6 (H) 03/12/2021   HGBA1C (H) 12/04/2009    10.5 (NOTE) The ADA recommends the following therapeutic goal for glycemic control related to Hgb A1c measurement: Goal of therapy: <6.5 Hgb A1c  Reference: American Diabetes Association: Clinical Practice Recommendations 2010, Diabetes Care, 2010, 33: (Suppl  1).   ESRSEDRATE 57 (H) 03/11/2021   CRP 16.9 (H) 03/11/2021   REPTSTATUS 03/17/2021 FINAL 03/12/2021   GRAMSTAIN  10/21/2010    NO WBC SEEN NO SQUAMOUS EPITHELIAL CELLS SEEN NO ORGANISMS SEEN   GRAMSTAIN  10/21/2010    NO WBC SEEN NO SQUAMOUS EPITHELIAL CELLS SEEN NO ORGANISMS SEEN   CULT  03/12/2021    NO GROWTH 5 DAYS Performed at [facility], [place] [address], [place], [place] [address]    LABORGA STAPHYLOCOCCUS AUREUS 10/21/2010     Lab Results  Component Value Date   ALBUMIN 2.6 (L) 03/13/2021   ALBUMIN 3.3 (L) 12/03/2009    Lab Results  Component Value Date   MG 1.7 03/13/2021   No results found for: VD25OH  No results found for: PREALBUMIN CBC  EXTENDED Latest Ref Rng & Units 03/13/2021 03/12/2021 03/11/2021  WBC 4.0 - 10.5 K/uL 6.8 7.5 9.3  RBC 4.22 - 5.81 MIL/uL 4.13(L) 4.13(L) 4.47  HGB 13.0 - 17.0 g/dL 11.6(L) 11.8(L) 12.6(L)  HCT 39.0 - 52.0 % 36.1(L) 35.6(L) 38.5(L)  PLT 150 - 400 K/uL 281 226 242  NEUTROABS 1.7 - 7.7 K/uL - - 5.8  LYMPHSABS 0.7 - 4.0 K/uL - - 2.0     There is no height or weight on file to calculate BMI.  Orders:  No orders of the defined types were placed in this encounter.  No orders of the defined types were placed in this encounter.    Procedures: No procedures performed  Clinical Data: No additional  findings.  ROS:  All other systems negative, except as noted in the HPI. Review of Systems  Constitutional:  Negative for chills and fever.  Cardiovascular:  Negative for leg swelling.  Skin:  Positive for wound. Negative for color change.   Objective: Vital Signs: There were no vitals taken for this visit.  Specialty Comments:  No specialty comments available.  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   Ulcer of left foot, limited to breakdown of skin ([place]) 02/07/2018   Chronic pain of both shoulders 08/09/2017   Pain in right wrist 08/09/2017   Atherosclerosis of renal artery ([place]) 04/24/2017   Type 2 diabetes mellitus without complications ([place]) [id number]   Chronic left-sided low back pain with left-sided sciatica 07/24/2016   Lower extremity edema 01/28/2015   Angina pectoris ([place]) 07/08/2014   Anemia, unspecified 05/06/2014   Acute gastric ulcer 08/27/2013   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   IDA (iron deficiency anemia) 08/25/2013   Mixed hyperlipidemia 08/13/2013   Essential hypertension, benign 08/13/2013   Past Medical History:  Diagnosis Date   Angina pectoris ([place])    Nuclear stress test 8/18: EF 59, fixed inf-lat defect likely attenuation, no ischemia, Low Risk   Arthritis    CMT (Charcot-Marie-Tooth disease)    Deafness    Diverticulosis    DM (diabetes  mellitus) (HCC)    HLD (hyperlipidemia)    HTN (hypertension)    Iron deficiency anemia    Multiple gastric ulcers    Overactive bladder    Ulcer     Family History  Problem Relation Age of Onset   Cervical cancer Mother        mets   Breast cancer Sister        mets   Diabetes Sister    Diabetes Brother    Heart disease Sister    Cervical cancer Sister     Past Surgical History:  Procedure Laterality Date   COLONOSCOPY N/A 08/27/2013   Procedure: COLONOSCOPY;  Surgeon: Jerene Bears, MD;  Location: [facility];  Service: Gastroenterology;  Laterality: N/A;   ESOPHAGOGASTRODUODENOSCOPY N/A 08/27/2013   Procedure: ESOPHAGOGASTRODUODENOSCOPY (EGD);  Surgeon: Jerene Bears, MD;  Location: [facility];  Service: Gastroenterology;  Laterality: N/A;   FOOT AMPUTATION THROUGH METATARSAL Left    FOOT SURGERY     age 21   TOTAL HIP ARTHROPLASTY Bilateral 2002, 2003   TOTAL KNEE ARTHROPLASTY Bilateral 1999   Social History   Occupational History   Occupation: retired Personal assistant  Tobacco Use   Smoking status: Never   Smokeless tobacco: Never  Substance and Sexual Activity   Alcohol use: No   Drug use: No   Sexual activity: Not on file

## 2021-04-13 NOTE — Telephone Encounter (Signed)
I called and advised that per Christian [name] this in not a skilled need dressing change. If the pt is receiving physical therapy they can have an aid come out a few times a week to assist with bathing. They di not need that service. Will call if things change or if pt needs to come in for eval sooner. Advised he needs a one month follow up and daughter advised she will call back to schedule.

## 2021-04-13 NOTE — Addendum Note (Signed)
Addended by: Dondra Prader R on: 04/13/2021 10:42 AM   Modules accepted: Orders

## 2021-04-13 NOTE — Telephone Encounter (Signed)
Pts daughter Christian [name] called stating the pt thought his PT that comes to his house was the same as a home health aid. Christian [name] would like to have a referral sent for a [place] aid to come do wound care on his foot and she asked that we call the pts other daughter Christian [name] to inform her of who and when the aid will be coming, as she is the primary care helper.   [place] CB# [phone]

## 2021-04-19 DIAGNOSIS — M24541 Contracture, right hand: Secondary | ICD-10-CM | POA: Diagnosis not present

## 2021-04-19 DIAGNOSIS — E1122 Type 2 diabetes mellitus with diabetic chronic kidney disease: Secondary | ICD-10-CM | POA: Diagnosis not present

## 2021-04-19 DIAGNOSIS — I252 Old myocardial infarction: Secondary | ICD-10-CM | POA: Diagnosis not present

## 2021-04-19 DIAGNOSIS — H913 Deaf nonspeaking, not elsewhere classified: Secondary | ICD-10-CM | POA: Diagnosis not present

## 2021-04-19 DIAGNOSIS — D509 Iron deficiency anemia, unspecified: Secondary | ICD-10-CM | POA: Diagnosis not present

## 2021-04-19 DIAGNOSIS — I700 Atherosclerosis of aorta: Secondary | ICD-10-CM | POA: Diagnosis not present

## 2021-04-19 DIAGNOSIS — M19041 Primary osteoarthritis, right hand: Secondary | ICD-10-CM | POA: Diagnosis not present

## 2021-04-19 DIAGNOSIS — E1142 Type 2 diabetes mellitus with diabetic polyneuropathy: Secondary | ICD-10-CM | POA: Diagnosis not present

## 2021-04-19 DIAGNOSIS — E1151 Type 2 diabetes mellitus with diabetic peripheral angiopathy without gangrene: Secondary | ICD-10-CM | POA: Diagnosis not present

## 2021-04-19 DIAGNOSIS — K5790 Diverticulosis of intestine, part unspecified, without perforation or abscess without bleeding: Secondary | ICD-10-CM | POA: Diagnosis not present

## 2021-04-19 DIAGNOSIS — N3281 Overactive bladder: Secondary | ICD-10-CM | POA: Diagnosis not present

## 2021-04-19 DIAGNOSIS — R338 Other retention of urine: Secondary | ICD-10-CM | POA: Diagnosis not present

## 2021-04-19 DIAGNOSIS — I2510 Atherosclerotic heart disease of native coronary artery without angina pectoris: Secondary | ICD-10-CM | POA: Diagnosis not present

## 2021-04-19 DIAGNOSIS — I5022 Chronic systolic (congestive) heart failure: Secondary | ICD-10-CM | POA: Diagnosis not present

## 2021-04-19 DIAGNOSIS — N1831 Chronic kidney disease, stage 3a: Secondary | ICD-10-CM | POA: Diagnosis not present

## 2021-04-19 DIAGNOSIS — F32A Depression, unspecified: Secondary | ICD-10-CM | POA: Diagnosis not present

## 2021-04-19 DIAGNOSIS — L03115 Cellulitis of right lower limb: Secondary | ICD-10-CM | POA: Diagnosis not present

## 2021-04-19 DIAGNOSIS — E785 Hyperlipidemia, unspecified: Secondary | ICD-10-CM | POA: Diagnosis not present

## 2021-04-19 DIAGNOSIS — D631 Anemia in chronic kidney disease: Secondary | ICD-10-CM | POA: Diagnosis not present

## 2021-04-19 DIAGNOSIS — I130 Hypertensive heart and chronic kidney disease with heart failure and stage 1 through stage 4 chronic kidney disease, or unspecified chronic kidney disease: Secondary | ICD-10-CM | POA: Diagnosis not present

## 2021-04-19 DIAGNOSIS — K219 Gastro-esophageal reflux disease without esophagitis: Secondary | ICD-10-CM | POA: Diagnosis not present

## 2021-04-19 DIAGNOSIS — M24542 Contracture, left hand: Secondary | ICD-10-CM | POA: Diagnosis not present

## 2021-04-19 DIAGNOSIS — M47812 Spondylosis without myelopathy or radiculopathy, cervical region: Secondary | ICD-10-CM | POA: Diagnosis not present

## 2021-04-25 DIAGNOSIS — I700 Atherosclerosis of aorta: Secondary | ICD-10-CM | POA: Diagnosis not present

## 2021-04-25 DIAGNOSIS — D509 Iron deficiency anemia, unspecified: Secondary | ICD-10-CM | POA: Diagnosis not present

## 2021-04-25 DIAGNOSIS — E785 Hyperlipidemia, unspecified: Secondary | ICD-10-CM | POA: Diagnosis not present

## 2021-04-25 DIAGNOSIS — I130 Hypertensive heart and chronic kidney disease with heart failure and stage 1 through stage 4 chronic kidney disease, or unspecified chronic kidney disease: Secondary | ICD-10-CM | POA: Diagnosis not present

## 2021-04-25 DIAGNOSIS — I252 Old myocardial infarction: Secondary | ICD-10-CM | POA: Diagnosis not present

## 2021-04-25 DIAGNOSIS — D631 Anemia in chronic kidney disease: Secondary | ICD-10-CM | POA: Diagnosis not present

## 2021-04-25 DIAGNOSIS — N3281 Overactive bladder: Secondary | ICD-10-CM | POA: Diagnosis not present

## 2021-04-25 DIAGNOSIS — H913 Deaf nonspeaking, not elsewhere classified: Secondary | ICD-10-CM | POA: Diagnosis not present

## 2021-04-25 DIAGNOSIS — E1122 Type 2 diabetes mellitus with diabetic chronic kidney disease: Secondary | ICD-10-CM | POA: Diagnosis not present

## 2021-04-25 DIAGNOSIS — M47812 Spondylosis without myelopathy or radiculopathy, cervical region: Secondary | ICD-10-CM | POA: Diagnosis not present

## 2021-04-25 DIAGNOSIS — K5790 Diverticulosis of intestine, part unspecified, without perforation or abscess without bleeding: Secondary | ICD-10-CM | POA: Diagnosis not present

## 2021-04-25 DIAGNOSIS — I5022 Chronic systolic (congestive) heart failure: Secondary | ICD-10-CM | POA: Diagnosis not present

## 2021-04-25 DIAGNOSIS — M24541 Contracture, right hand: Secondary | ICD-10-CM | POA: Diagnosis not present

## 2021-04-25 DIAGNOSIS — I2510 Atherosclerotic heart disease of native coronary artery without angina pectoris: Secondary | ICD-10-CM | POA: Diagnosis not present

## 2021-04-25 DIAGNOSIS — L03115 Cellulitis of right lower limb: Secondary | ICD-10-CM | POA: Diagnosis not present

## 2021-04-25 DIAGNOSIS — K219 Gastro-esophageal reflux disease without esophagitis: Secondary | ICD-10-CM | POA: Diagnosis not present

## 2021-04-25 DIAGNOSIS — F32A Depression, unspecified: Secondary | ICD-10-CM | POA: Diagnosis not present

## 2021-04-25 DIAGNOSIS — R338 Other retention of urine: Secondary | ICD-10-CM | POA: Diagnosis not present

## 2021-04-25 DIAGNOSIS — M19041 Primary osteoarthritis, right hand: Secondary | ICD-10-CM | POA: Diagnosis not present

## 2021-04-25 DIAGNOSIS — E1142 Type 2 diabetes mellitus with diabetic polyneuropathy: Secondary | ICD-10-CM | POA: Diagnosis not present

## 2021-04-25 DIAGNOSIS — M24542 Contracture, left hand: Secondary | ICD-10-CM | POA: Diagnosis not present

## 2021-04-25 DIAGNOSIS — E1151 Type 2 diabetes mellitus with diabetic peripheral angiopathy without gangrene: Secondary | ICD-10-CM | POA: Diagnosis not present

## 2021-04-25 DIAGNOSIS — N1831 Chronic kidney disease, stage 3a: Secondary | ICD-10-CM | POA: Diagnosis not present

## 2021-04-27 DIAGNOSIS — M24541 Contracture, right hand: Secondary | ICD-10-CM | POA: Diagnosis not present

## 2021-04-27 DIAGNOSIS — I700 Atherosclerosis of aorta: Secondary | ICD-10-CM | POA: Diagnosis not present

## 2021-04-27 DIAGNOSIS — I130 Hypertensive heart and chronic kidney disease with heart failure and stage 1 through stage 4 chronic kidney disease, or unspecified chronic kidney disease: Secondary | ICD-10-CM | POA: Diagnosis not present

## 2021-04-27 DIAGNOSIS — R338 Other retention of urine: Secondary | ICD-10-CM | POA: Diagnosis not present

## 2021-04-27 DIAGNOSIS — K219 Gastro-esophageal reflux disease without esophagitis: Secondary | ICD-10-CM | POA: Diagnosis not present

## 2021-04-27 DIAGNOSIS — I2510 Atherosclerotic heart disease of native coronary artery without angina pectoris: Secondary | ICD-10-CM | POA: Diagnosis not present

## 2021-04-27 DIAGNOSIS — M24542 Contracture, left hand: Secondary | ICD-10-CM | POA: Diagnosis not present

## 2021-04-27 DIAGNOSIS — F32A Depression, unspecified: Secondary | ICD-10-CM | POA: Diagnosis not present

## 2021-04-27 DIAGNOSIS — E1122 Type 2 diabetes mellitus with diabetic chronic kidney disease: Secondary | ICD-10-CM | POA: Diagnosis not present

## 2021-04-27 DIAGNOSIS — H913 Deaf nonspeaking, not elsewhere classified: Secondary | ICD-10-CM | POA: Diagnosis not present

## 2021-04-27 DIAGNOSIS — E1142 Type 2 diabetes mellitus with diabetic polyneuropathy: Secondary | ICD-10-CM | POA: Diagnosis not present

## 2021-04-27 DIAGNOSIS — D631 Anemia in chronic kidney disease: Secondary | ICD-10-CM | POA: Diagnosis not present

## 2021-04-27 DIAGNOSIS — E1151 Type 2 diabetes mellitus with diabetic peripheral angiopathy without gangrene: Secondary | ICD-10-CM | POA: Diagnosis not present

## 2021-04-27 DIAGNOSIS — M47812 Spondylosis without myelopathy or radiculopathy, cervical region: Secondary | ICD-10-CM | POA: Diagnosis not present

## 2021-04-27 DIAGNOSIS — D509 Iron deficiency anemia, unspecified: Secondary | ICD-10-CM | POA: Diagnosis not present

## 2021-04-27 DIAGNOSIS — N3281 Overactive bladder: Secondary | ICD-10-CM | POA: Diagnosis not present

## 2021-04-27 DIAGNOSIS — L03115 Cellulitis of right lower limb: Secondary | ICD-10-CM | POA: Diagnosis not present

## 2021-04-27 DIAGNOSIS — N1831 Chronic kidney disease, stage 3a: Secondary | ICD-10-CM | POA: Diagnosis not present

## 2021-04-27 DIAGNOSIS — E785 Hyperlipidemia, unspecified: Secondary | ICD-10-CM | POA: Diagnosis not present

## 2021-04-27 DIAGNOSIS — M19041 Primary osteoarthritis, right hand: Secondary | ICD-10-CM | POA: Diagnosis not present

## 2021-04-27 DIAGNOSIS — I252 Old myocardial infarction: Secondary | ICD-10-CM | POA: Diagnosis not present

## 2021-04-27 DIAGNOSIS — I5022 Chronic systolic (congestive) heart failure: Secondary | ICD-10-CM | POA: Diagnosis not present

## 2021-04-27 DIAGNOSIS — K5790 Diverticulosis of intestine, part unspecified, without perforation or abscess without bleeding: Secondary | ICD-10-CM | POA: Diagnosis not present

## 2021-04-28 DIAGNOSIS — Z794 Long term (current) use of insulin: Secondary | ICD-10-CM | POA: Diagnosis not present

## 2021-04-28 DIAGNOSIS — L97421 Non-pressure chronic ulcer of left heel and midfoot limited to breakdown of skin: Secondary | ICD-10-CM | POA: Diagnosis not present

## 2021-04-28 DIAGNOSIS — E11621 Type 2 diabetes mellitus with foot ulcer: Secondary | ICD-10-CM | POA: Diagnosis not present

## 2021-05-03 DIAGNOSIS — I700 Atherosclerosis of aorta: Secondary | ICD-10-CM | POA: Diagnosis not present

## 2021-05-03 DIAGNOSIS — N3281 Overactive bladder: Secondary | ICD-10-CM | POA: Diagnosis not present

## 2021-05-03 DIAGNOSIS — M47812 Spondylosis without myelopathy or radiculopathy, cervical region: Secondary | ICD-10-CM | POA: Diagnosis not present

## 2021-05-03 DIAGNOSIS — E785 Hyperlipidemia, unspecified: Secondary | ICD-10-CM | POA: Diagnosis not present

## 2021-05-03 DIAGNOSIS — I130 Hypertensive heart and chronic kidney disease with heart failure and stage 1 through stage 4 chronic kidney disease, or unspecified chronic kidney disease: Secondary | ICD-10-CM | POA: Diagnosis not present

## 2021-05-03 DIAGNOSIS — E1151 Type 2 diabetes mellitus with diabetic peripheral angiopathy without gangrene: Secondary | ICD-10-CM | POA: Diagnosis not present

## 2021-05-03 DIAGNOSIS — I2510 Atherosclerotic heart disease of native coronary artery without angina pectoris: Secondary | ICD-10-CM | POA: Diagnosis not present

## 2021-05-03 DIAGNOSIS — M24542 Contracture, left hand: Secondary | ICD-10-CM | POA: Diagnosis not present

## 2021-05-03 DIAGNOSIS — L03115 Cellulitis of right lower limb: Secondary | ICD-10-CM | POA: Diagnosis not present

## 2021-05-03 DIAGNOSIS — E1142 Type 2 diabetes mellitus with diabetic polyneuropathy: Secondary | ICD-10-CM | POA: Diagnosis not present

## 2021-05-03 DIAGNOSIS — K5790 Diverticulosis of intestine, part unspecified, without perforation or abscess without bleeding: Secondary | ICD-10-CM | POA: Diagnosis not present

## 2021-05-03 DIAGNOSIS — I252 Old myocardial infarction: Secondary | ICD-10-CM | POA: Diagnosis not present

## 2021-05-03 DIAGNOSIS — M19041 Primary osteoarthritis, right hand: Secondary | ICD-10-CM | POA: Diagnosis not present

## 2021-05-03 DIAGNOSIS — E1122 Type 2 diabetes mellitus with diabetic chronic kidney disease: Secondary | ICD-10-CM | POA: Diagnosis not present

## 2021-05-03 DIAGNOSIS — I5022 Chronic systolic (congestive) heart failure: Secondary | ICD-10-CM | POA: Diagnosis not present

## 2021-05-03 DIAGNOSIS — K219 Gastro-esophageal reflux disease without esophagitis: Secondary | ICD-10-CM | POA: Diagnosis not present

## 2021-05-03 DIAGNOSIS — D631 Anemia in chronic kidney disease: Secondary | ICD-10-CM | POA: Diagnosis not present

## 2021-05-03 DIAGNOSIS — N1831 Chronic kidney disease, stage 3a: Secondary | ICD-10-CM | POA: Diagnosis not present

## 2021-05-03 DIAGNOSIS — R338 Other retention of urine: Secondary | ICD-10-CM | POA: Diagnosis not present

## 2021-05-03 DIAGNOSIS — H913 Deaf nonspeaking, not elsewhere classified: Secondary | ICD-10-CM | POA: Diagnosis not present

## 2021-05-03 DIAGNOSIS — D509 Iron deficiency anemia, unspecified: Secondary | ICD-10-CM | POA: Diagnosis not present

## 2021-05-03 DIAGNOSIS — M24541 Contracture, right hand: Secondary | ICD-10-CM | POA: Diagnosis not present

## 2021-05-03 DIAGNOSIS — F32A Depression, unspecified: Secondary | ICD-10-CM | POA: Diagnosis not present

## 2021-05-05 DIAGNOSIS — N1831 Chronic kidney disease, stage 3a: Secondary | ICD-10-CM | POA: Diagnosis not present

## 2021-05-05 DIAGNOSIS — D631 Anemia in chronic kidney disease: Secondary | ICD-10-CM | POA: Diagnosis not present

## 2021-05-05 DIAGNOSIS — L03115 Cellulitis of right lower limb: Secondary | ICD-10-CM | POA: Diagnosis not present

## 2021-05-05 DIAGNOSIS — I2510 Atherosclerotic heart disease of native coronary artery without angina pectoris: Secondary | ICD-10-CM | POA: Diagnosis not present

## 2021-05-05 DIAGNOSIS — F32A Depression, unspecified: Secondary | ICD-10-CM | POA: Diagnosis not present

## 2021-05-05 DIAGNOSIS — I130 Hypertensive heart and chronic kidney disease with heart failure and stage 1 through stage 4 chronic kidney disease, or unspecified chronic kidney disease: Secondary | ICD-10-CM | POA: Diagnosis not present

## 2021-05-05 DIAGNOSIS — M24542 Contracture, left hand: Secondary | ICD-10-CM | POA: Diagnosis not present

## 2021-05-05 DIAGNOSIS — M19041 Primary osteoarthritis, right hand: Secondary | ICD-10-CM | POA: Diagnosis not present

## 2021-05-05 DIAGNOSIS — K219 Gastro-esophageal reflux disease without esophagitis: Secondary | ICD-10-CM | POA: Diagnosis not present

## 2021-05-05 DIAGNOSIS — H913 Deaf nonspeaking, not elsewhere classified: Secondary | ICD-10-CM | POA: Diagnosis not present

## 2021-05-05 DIAGNOSIS — N3281 Overactive bladder: Secondary | ICD-10-CM | POA: Diagnosis not present

## 2021-05-05 DIAGNOSIS — M47812 Spondylosis without myelopathy or radiculopathy, cervical region: Secondary | ICD-10-CM | POA: Diagnosis not present

## 2021-05-05 DIAGNOSIS — I252 Old myocardial infarction: Secondary | ICD-10-CM | POA: Diagnosis not present

## 2021-05-05 DIAGNOSIS — K5790 Diverticulosis of intestine, part unspecified, without perforation or abscess without bleeding: Secondary | ICD-10-CM | POA: Diagnosis not present

## 2021-05-05 DIAGNOSIS — M24541 Contracture, right hand: Secondary | ICD-10-CM | POA: Diagnosis not present

## 2021-05-05 DIAGNOSIS — E1122 Type 2 diabetes mellitus with diabetic chronic kidney disease: Secondary | ICD-10-CM | POA: Diagnosis not present

## 2021-05-05 DIAGNOSIS — D509 Iron deficiency anemia, unspecified: Secondary | ICD-10-CM | POA: Diagnosis not present

## 2021-05-05 DIAGNOSIS — E785 Hyperlipidemia, unspecified: Secondary | ICD-10-CM | POA: Diagnosis not present

## 2021-05-05 DIAGNOSIS — R338 Other retention of urine: Secondary | ICD-10-CM | POA: Diagnosis not present

## 2021-05-05 DIAGNOSIS — E1151 Type 2 diabetes mellitus with diabetic peripheral angiopathy without gangrene: Secondary | ICD-10-CM | POA: Diagnosis not present

## 2021-05-05 DIAGNOSIS — E1142 Type 2 diabetes mellitus with diabetic polyneuropathy: Secondary | ICD-10-CM | POA: Diagnosis not present

## 2021-05-05 DIAGNOSIS — I700 Atherosclerosis of aorta: Secondary | ICD-10-CM | POA: Diagnosis not present

## 2021-05-05 DIAGNOSIS — I5022 Chronic systolic (congestive) heart failure: Secondary | ICD-10-CM | POA: Diagnosis not present

## 2021-05-09 DIAGNOSIS — D509 Iron deficiency anemia, unspecified: Secondary | ICD-10-CM | POA: Diagnosis not present

## 2021-05-09 DIAGNOSIS — K5790 Diverticulosis of intestine, part unspecified, without perforation or abscess without bleeding: Secondary | ICD-10-CM | POA: Diagnosis not present

## 2021-05-09 DIAGNOSIS — F32A Depression, unspecified: Secondary | ICD-10-CM | POA: Diagnosis not present

## 2021-05-09 DIAGNOSIS — I252 Old myocardial infarction: Secondary | ICD-10-CM | POA: Diagnosis not present

## 2021-05-09 DIAGNOSIS — R338 Other retention of urine: Secondary | ICD-10-CM | POA: Diagnosis not present

## 2021-05-09 DIAGNOSIS — M24542 Contracture, left hand: Secondary | ICD-10-CM | POA: Diagnosis not present

## 2021-05-09 DIAGNOSIS — M24541 Contracture, right hand: Secondary | ICD-10-CM | POA: Diagnosis not present

## 2021-05-09 DIAGNOSIS — I130 Hypertensive heart and chronic kidney disease with heart failure and stage 1 through stage 4 chronic kidney disease, or unspecified chronic kidney disease: Secondary | ICD-10-CM | POA: Diagnosis not present

## 2021-05-09 DIAGNOSIS — M47812 Spondylosis without myelopathy or radiculopathy, cervical region: Secondary | ICD-10-CM | POA: Diagnosis not present

## 2021-05-09 DIAGNOSIS — E1122 Type 2 diabetes mellitus with diabetic chronic kidney disease: Secondary | ICD-10-CM | POA: Diagnosis not present

## 2021-05-09 DIAGNOSIS — N1831 Chronic kidney disease, stage 3a: Secondary | ICD-10-CM | POA: Diagnosis not present

## 2021-05-09 DIAGNOSIS — I2510 Atherosclerotic heart disease of native coronary artery without angina pectoris: Secondary | ICD-10-CM | POA: Diagnosis not present

## 2021-05-09 DIAGNOSIS — L03115 Cellulitis of right lower limb: Secondary | ICD-10-CM | POA: Diagnosis not present

## 2021-05-09 DIAGNOSIS — E1142 Type 2 diabetes mellitus with diabetic polyneuropathy: Secondary | ICD-10-CM | POA: Diagnosis not present

## 2021-05-09 DIAGNOSIS — D631 Anemia in chronic kidney disease: Secondary | ICD-10-CM | POA: Diagnosis not present

## 2021-05-09 DIAGNOSIS — E785 Hyperlipidemia, unspecified: Secondary | ICD-10-CM | POA: Diagnosis not present

## 2021-05-09 DIAGNOSIS — I5022 Chronic systolic (congestive) heart failure: Secondary | ICD-10-CM | POA: Diagnosis not present

## 2021-05-09 DIAGNOSIS — M19041 Primary osteoarthritis, right hand: Secondary | ICD-10-CM | POA: Diagnosis not present

## 2021-05-09 DIAGNOSIS — I700 Atherosclerosis of aorta: Secondary | ICD-10-CM | POA: Diagnosis not present

## 2021-05-09 DIAGNOSIS — H913 Deaf nonspeaking, not elsewhere classified: Secondary | ICD-10-CM | POA: Diagnosis not present

## 2021-05-09 DIAGNOSIS — N3281 Overactive bladder: Secondary | ICD-10-CM | POA: Diagnosis not present

## 2021-05-09 DIAGNOSIS — E1151 Type 2 diabetes mellitus with diabetic peripheral angiopathy without gangrene: Secondary | ICD-10-CM | POA: Diagnosis not present

## 2021-05-09 DIAGNOSIS — K219 Gastro-esophageal reflux disease without esophagitis: Secondary | ICD-10-CM | POA: Diagnosis not present

## 2021-05-12 DIAGNOSIS — K219 Gastro-esophageal reflux disease without esophagitis: Secondary | ICD-10-CM | POA: Diagnosis not present

## 2021-05-12 DIAGNOSIS — M24542 Contracture, left hand: Secondary | ICD-10-CM | POA: Diagnosis not present

## 2021-05-12 DIAGNOSIS — H913 Deaf nonspeaking, not elsewhere classified: Secondary | ICD-10-CM | POA: Diagnosis not present

## 2021-05-12 DIAGNOSIS — E1122 Type 2 diabetes mellitus with diabetic chronic kidney disease: Secondary | ICD-10-CM | POA: Diagnosis not present

## 2021-05-12 DIAGNOSIS — M19041 Primary osteoarthritis, right hand: Secondary | ICD-10-CM | POA: Diagnosis not present

## 2021-05-12 DIAGNOSIS — M24541 Contracture, right hand: Secondary | ICD-10-CM | POA: Diagnosis not present

## 2021-05-12 DIAGNOSIS — E1142 Type 2 diabetes mellitus with diabetic polyneuropathy: Secondary | ICD-10-CM | POA: Diagnosis not present

## 2021-05-12 DIAGNOSIS — I2510 Atherosclerotic heart disease of native coronary artery without angina pectoris: Secondary | ICD-10-CM | POA: Diagnosis not present

## 2021-05-12 DIAGNOSIS — E1151 Type 2 diabetes mellitus with diabetic peripheral angiopathy without gangrene: Secondary | ICD-10-CM | POA: Diagnosis not present

## 2021-05-12 DIAGNOSIS — N1831 Chronic kidney disease, stage 3a: Secondary | ICD-10-CM | POA: Diagnosis not present

## 2021-05-12 DIAGNOSIS — F32A Depression, unspecified: Secondary | ICD-10-CM | POA: Diagnosis not present

## 2021-05-12 DIAGNOSIS — I130 Hypertensive heart and chronic kidney disease with heart failure and stage 1 through stage 4 chronic kidney disease, or unspecified chronic kidney disease: Secondary | ICD-10-CM | POA: Diagnosis not present

## 2021-05-12 DIAGNOSIS — I252 Old myocardial infarction: Secondary | ICD-10-CM | POA: Diagnosis not present

## 2021-05-12 DIAGNOSIS — I700 Atherosclerosis of aorta: Secondary | ICD-10-CM | POA: Diagnosis not present

## 2021-05-12 DIAGNOSIS — E785 Hyperlipidemia, unspecified: Secondary | ICD-10-CM | POA: Diagnosis not present

## 2021-05-12 DIAGNOSIS — N3281 Overactive bladder: Secondary | ICD-10-CM | POA: Diagnosis not present

## 2021-05-12 DIAGNOSIS — R338 Other retention of urine: Secondary | ICD-10-CM | POA: Diagnosis not present

## 2021-05-12 DIAGNOSIS — L03115 Cellulitis of right lower limb: Secondary | ICD-10-CM | POA: Diagnosis not present

## 2021-05-12 DIAGNOSIS — M47812 Spondylosis without myelopathy or radiculopathy, cervical region: Secondary | ICD-10-CM | POA: Diagnosis not present

## 2021-05-12 DIAGNOSIS — I5022 Chronic systolic (congestive) heart failure: Secondary | ICD-10-CM | POA: Diagnosis not present

## 2021-05-12 DIAGNOSIS — D509 Iron deficiency anemia, unspecified: Secondary | ICD-10-CM | POA: Diagnosis not present

## 2021-05-12 DIAGNOSIS — K5790 Diverticulosis of intestine, part unspecified, without perforation or abscess without bleeding: Secondary | ICD-10-CM | POA: Diagnosis not present

## 2021-05-12 DIAGNOSIS — D631 Anemia in chronic kidney disease: Secondary | ICD-10-CM | POA: Diagnosis not present

## 2021-05-17 DIAGNOSIS — I2510 Atherosclerotic heart disease of native coronary artery without angina pectoris: Secondary | ICD-10-CM | POA: Diagnosis not present

## 2021-05-17 DIAGNOSIS — F32A Depression, unspecified: Secondary | ICD-10-CM | POA: Diagnosis not present

## 2021-05-17 DIAGNOSIS — N3281 Overactive bladder: Secondary | ICD-10-CM | POA: Diagnosis not present

## 2021-05-17 DIAGNOSIS — K219 Gastro-esophageal reflux disease without esophagitis: Secondary | ICD-10-CM | POA: Diagnosis not present

## 2021-05-17 DIAGNOSIS — M47812 Spondylosis without myelopathy or radiculopathy, cervical region: Secondary | ICD-10-CM | POA: Diagnosis not present

## 2021-05-17 DIAGNOSIS — M24541 Contracture, right hand: Secondary | ICD-10-CM | POA: Diagnosis not present

## 2021-05-17 DIAGNOSIS — E1142 Type 2 diabetes mellitus with diabetic polyneuropathy: Secondary | ICD-10-CM | POA: Diagnosis not present

## 2021-05-17 DIAGNOSIS — I252 Old myocardial infarction: Secondary | ICD-10-CM | POA: Diagnosis not present

## 2021-05-17 DIAGNOSIS — N1831 Chronic kidney disease, stage 3a: Secondary | ICD-10-CM | POA: Diagnosis not present

## 2021-05-17 DIAGNOSIS — D509 Iron deficiency anemia, unspecified: Secondary | ICD-10-CM | POA: Diagnosis not present

## 2021-05-17 DIAGNOSIS — D631 Anemia in chronic kidney disease: Secondary | ICD-10-CM | POA: Diagnosis not present

## 2021-05-17 DIAGNOSIS — E1122 Type 2 diabetes mellitus with diabetic chronic kidney disease: Secondary | ICD-10-CM | POA: Diagnosis not present

## 2021-05-17 DIAGNOSIS — L03115 Cellulitis of right lower limb: Secondary | ICD-10-CM | POA: Diagnosis not present

## 2021-05-17 DIAGNOSIS — I130 Hypertensive heart and chronic kidney disease with heart failure and stage 1 through stage 4 chronic kidney disease, or unspecified chronic kidney disease: Secondary | ICD-10-CM | POA: Diagnosis not present

## 2021-05-17 DIAGNOSIS — M19041 Primary osteoarthritis, right hand: Secondary | ICD-10-CM | POA: Diagnosis not present

## 2021-05-17 DIAGNOSIS — E1151 Type 2 diabetes mellitus with diabetic peripheral angiopathy without gangrene: Secondary | ICD-10-CM | POA: Diagnosis not present

## 2021-05-17 DIAGNOSIS — H913 Deaf nonspeaking, not elsewhere classified: Secondary | ICD-10-CM | POA: Diagnosis not present

## 2021-05-17 DIAGNOSIS — K5790 Diverticulosis of intestine, part unspecified, without perforation or abscess without bleeding: Secondary | ICD-10-CM | POA: Diagnosis not present

## 2021-05-17 DIAGNOSIS — R338 Other retention of urine: Secondary | ICD-10-CM | POA: Diagnosis not present

## 2021-05-17 DIAGNOSIS — E785 Hyperlipidemia, unspecified: Secondary | ICD-10-CM | POA: Diagnosis not present

## 2021-05-17 DIAGNOSIS — I5022 Chronic systolic (congestive) heart failure: Secondary | ICD-10-CM | POA: Diagnosis not present

## 2021-05-17 DIAGNOSIS — M24542 Contracture, left hand: Secondary | ICD-10-CM | POA: Diagnosis not present

## 2021-05-17 DIAGNOSIS — I700 Atherosclerosis of aorta: Secondary | ICD-10-CM | POA: Diagnosis not present

## 2021-05-18 DIAGNOSIS — E119 Type 2 diabetes mellitus without complications: Secondary | ICD-10-CM | POA: Diagnosis not present

## 2021-05-18 DIAGNOSIS — Z794 Long term (current) use of insulin: Secondary | ICD-10-CM | POA: Diagnosis not present

## 2021-05-18 DIAGNOSIS — N1831 Chronic kidney disease, stage 3a: Secondary | ICD-10-CM | POA: Diagnosis not present

## 2021-05-18 DIAGNOSIS — I872 Venous insufficiency (chronic) (peripheral): Secondary | ICD-10-CM | POA: Diagnosis not present

## 2021-05-18 DIAGNOSIS — I10 Essential (primary) hypertension: Secondary | ICD-10-CM | POA: Diagnosis not present

## 2021-05-18 DIAGNOSIS — I129 Hypertensive chronic kidney disease with stage 1 through stage 4 chronic kidney disease, or unspecified chronic kidney disease: Secondary | ICD-10-CM | POA: Diagnosis not present

## 2021-05-18 DIAGNOSIS — M40292 Other kyphosis, cervical region: Secondary | ICD-10-CM | POA: Diagnosis not present

## 2021-05-18 DIAGNOSIS — R2242 Localized swelling, mass and lump, left lower limb: Secondary | ICD-10-CM | POA: Diagnosis not present

## 2021-05-18 DIAGNOSIS — Z7409 Other reduced mobility: Secondary | ICD-10-CM | POA: Diagnosis not present

## 2021-05-19 DIAGNOSIS — I130 Hypertensive heart and chronic kidney disease with heart failure and stage 1 through stage 4 chronic kidney disease, or unspecified chronic kidney disease: Secondary | ICD-10-CM | POA: Diagnosis not present

## 2021-05-19 DIAGNOSIS — D631 Anemia in chronic kidney disease: Secondary | ICD-10-CM | POA: Diagnosis not present

## 2021-05-19 DIAGNOSIS — M47812 Spondylosis without myelopathy or radiculopathy, cervical region: Secondary | ICD-10-CM | POA: Diagnosis not present

## 2021-05-19 DIAGNOSIS — L03115 Cellulitis of right lower limb: Secondary | ICD-10-CM | POA: Diagnosis not present

## 2021-05-19 DIAGNOSIS — M19041 Primary osteoarthritis, right hand: Secondary | ICD-10-CM | POA: Diagnosis not present

## 2021-05-19 DIAGNOSIS — D509 Iron deficiency anemia, unspecified: Secondary | ICD-10-CM | POA: Diagnosis not present

## 2021-05-19 DIAGNOSIS — M24541 Contracture, right hand: Secondary | ICD-10-CM | POA: Diagnosis not present

## 2021-05-19 DIAGNOSIS — E1142 Type 2 diabetes mellitus with diabetic polyneuropathy: Secondary | ICD-10-CM | POA: Diagnosis not present

## 2021-05-19 DIAGNOSIS — K5790 Diverticulosis of intestine, part unspecified, without perforation or abscess without bleeding: Secondary | ICD-10-CM | POA: Diagnosis not present

## 2021-05-19 DIAGNOSIS — N1831 Chronic kidney disease, stage 3a: Secondary | ICD-10-CM | POA: Diagnosis not present

## 2021-05-19 DIAGNOSIS — F32A Depression, unspecified: Secondary | ICD-10-CM | POA: Diagnosis not present

## 2021-05-19 DIAGNOSIS — E1151 Type 2 diabetes mellitus with diabetic peripheral angiopathy without gangrene: Secondary | ICD-10-CM | POA: Diagnosis not present

## 2021-05-19 DIAGNOSIS — E1122 Type 2 diabetes mellitus with diabetic chronic kidney disease: Secondary | ICD-10-CM | POA: Diagnosis not present

## 2021-05-19 DIAGNOSIS — R338 Other retention of urine: Secondary | ICD-10-CM | POA: Diagnosis not present

## 2021-05-19 DIAGNOSIS — E785 Hyperlipidemia, unspecified: Secondary | ICD-10-CM | POA: Diagnosis not present

## 2021-05-19 DIAGNOSIS — M24542 Contracture, left hand: Secondary | ICD-10-CM | POA: Diagnosis not present

## 2021-05-19 DIAGNOSIS — I700 Atherosclerosis of aorta: Secondary | ICD-10-CM | POA: Diagnosis not present

## 2021-05-19 DIAGNOSIS — I2510 Atherosclerotic heart disease of native coronary artery without angina pectoris: Secondary | ICD-10-CM | POA: Diagnosis not present

## 2021-05-19 DIAGNOSIS — I5022 Chronic systolic (congestive) heart failure: Secondary | ICD-10-CM | POA: Diagnosis not present

## 2021-05-19 DIAGNOSIS — K219 Gastro-esophageal reflux disease without esophagitis: Secondary | ICD-10-CM | POA: Diagnosis not present

## 2021-05-19 DIAGNOSIS — H913 Deaf nonspeaking, not elsewhere classified: Secondary | ICD-10-CM | POA: Diagnosis not present

## 2021-05-19 DIAGNOSIS — I252 Old myocardial infarction: Secondary | ICD-10-CM | POA: Diagnosis not present

## 2021-05-19 DIAGNOSIS — N3281 Overactive bladder: Secondary | ICD-10-CM | POA: Diagnosis not present

## 2021-05-23 DIAGNOSIS — N1831 Chronic kidney disease, stage 3a: Secondary | ICD-10-CM | POA: Diagnosis not present

## 2021-05-23 DIAGNOSIS — M24542 Contracture, left hand: Secondary | ICD-10-CM | POA: Diagnosis not present

## 2021-05-23 DIAGNOSIS — H913 Deaf nonspeaking, not elsewhere classified: Secondary | ICD-10-CM | POA: Diagnosis not present

## 2021-05-23 DIAGNOSIS — I5022 Chronic systolic (congestive) heart failure: Secondary | ICD-10-CM | POA: Diagnosis not present

## 2021-05-23 DIAGNOSIS — M24541 Contracture, right hand: Secondary | ICD-10-CM | POA: Diagnosis not present

## 2021-05-23 DIAGNOSIS — E785 Hyperlipidemia, unspecified: Secondary | ICD-10-CM | POA: Diagnosis not present

## 2021-05-23 DIAGNOSIS — N3281 Overactive bladder: Secondary | ICD-10-CM | POA: Diagnosis not present

## 2021-05-23 DIAGNOSIS — R338 Other retention of urine: Secondary | ICD-10-CM | POA: Diagnosis not present

## 2021-05-23 DIAGNOSIS — L03115 Cellulitis of right lower limb: Secondary | ICD-10-CM | POA: Diagnosis not present

## 2021-05-23 DIAGNOSIS — E1151 Type 2 diabetes mellitus with diabetic peripheral angiopathy without gangrene: Secondary | ICD-10-CM | POA: Diagnosis not present

## 2021-05-23 DIAGNOSIS — K5790 Diverticulosis of intestine, part unspecified, without perforation or abscess without bleeding: Secondary | ICD-10-CM | POA: Diagnosis not present

## 2021-05-23 DIAGNOSIS — I700 Atherosclerosis of aorta: Secondary | ICD-10-CM | POA: Diagnosis not present

## 2021-05-23 DIAGNOSIS — E1142 Type 2 diabetes mellitus with diabetic polyneuropathy: Secondary | ICD-10-CM | POA: Diagnosis not present

## 2021-05-23 DIAGNOSIS — D509 Iron deficiency anemia, unspecified: Secondary | ICD-10-CM | POA: Diagnosis not present

## 2021-05-23 DIAGNOSIS — I2510 Atherosclerotic heart disease of native coronary artery without angina pectoris: Secondary | ICD-10-CM | POA: Diagnosis not present

## 2021-05-23 DIAGNOSIS — I252 Old myocardial infarction: Secondary | ICD-10-CM | POA: Diagnosis not present

## 2021-05-23 DIAGNOSIS — I130 Hypertensive heart and chronic kidney disease with heart failure and stage 1 through stage 4 chronic kidney disease, or unspecified chronic kidney disease: Secondary | ICD-10-CM | POA: Diagnosis not present

## 2021-05-23 DIAGNOSIS — E1122 Type 2 diabetes mellitus with diabetic chronic kidney disease: Secondary | ICD-10-CM | POA: Diagnosis not present

## 2021-05-23 DIAGNOSIS — M19041 Primary osteoarthritis, right hand: Secondary | ICD-10-CM | POA: Diagnosis not present

## 2021-05-23 DIAGNOSIS — K219 Gastro-esophageal reflux disease without esophagitis: Secondary | ICD-10-CM | POA: Diagnosis not present

## 2021-05-23 DIAGNOSIS — F32A Depression, unspecified: Secondary | ICD-10-CM | POA: Diagnosis not present

## 2021-05-23 DIAGNOSIS — D631 Anemia in chronic kidney disease: Secondary | ICD-10-CM | POA: Diagnosis not present

## 2021-05-23 DIAGNOSIS — M47812 Spondylosis without myelopathy or radiculopathy, cervical region: Secondary | ICD-10-CM | POA: Diagnosis not present

## 2021-05-30 DIAGNOSIS — R338 Other retention of urine: Secondary | ICD-10-CM | POA: Diagnosis not present

## 2021-05-30 DIAGNOSIS — D631 Anemia in chronic kidney disease: Secondary | ICD-10-CM | POA: Diagnosis not present

## 2021-05-30 DIAGNOSIS — N1831 Chronic kidney disease, stage 3a: Secondary | ICD-10-CM | POA: Diagnosis not present

## 2021-05-30 DIAGNOSIS — I130 Hypertensive heart and chronic kidney disease with heart failure and stage 1 through stage 4 chronic kidney disease, or unspecified chronic kidney disease: Secondary | ICD-10-CM | POA: Diagnosis not present

## 2021-05-30 DIAGNOSIS — E1151 Type 2 diabetes mellitus with diabetic peripheral angiopathy without gangrene: Secondary | ICD-10-CM | POA: Diagnosis not present

## 2021-05-30 DIAGNOSIS — H913 Deaf nonspeaking, not elsewhere classified: Secondary | ICD-10-CM | POA: Diagnosis not present

## 2021-05-30 DIAGNOSIS — I2510 Atherosclerotic heart disease of native coronary artery without angina pectoris: Secondary | ICD-10-CM | POA: Diagnosis not present

## 2021-05-30 DIAGNOSIS — M19041 Primary osteoarthritis, right hand: Secondary | ICD-10-CM | POA: Diagnosis not present

## 2021-05-30 DIAGNOSIS — K5790 Diverticulosis of intestine, part unspecified, without perforation or abscess without bleeding: Secondary | ICD-10-CM | POA: Diagnosis not present

## 2021-05-30 DIAGNOSIS — I5022 Chronic systolic (congestive) heart failure: Secondary | ICD-10-CM | POA: Diagnosis not present

## 2021-05-30 DIAGNOSIS — E1142 Type 2 diabetes mellitus with diabetic polyneuropathy: Secondary | ICD-10-CM | POA: Diagnosis not present

## 2021-05-30 DIAGNOSIS — M24542 Contracture, left hand: Secondary | ICD-10-CM | POA: Diagnosis not present

## 2021-05-30 DIAGNOSIS — D509 Iron deficiency anemia, unspecified: Secondary | ICD-10-CM | POA: Diagnosis not present

## 2021-05-30 DIAGNOSIS — M24541 Contracture, right hand: Secondary | ICD-10-CM | POA: Diagnosis not present

## 2021-05-30 DIAGNOSIS — I252 Old myocardial infarction: Secondary | ICD-10-CM | POA: Diagnosis not present

## 2021-05-30 DIAGNOSIS — L97421 Non-pressure chronic ulcer of left heel and midfoot limited to breakdown of skin: Secondary | ICD-10-CM | POA: Diagnosis not present

## 2021-05-30 DIAGNOSIS — N3281 Overactive bladder: Secondary | ICD-10-CM | POA: Diagnosis not present

## 2021-05-30 DIAGNOSIS — M47812 Spondylosis without myelopathy or radiculopathy, cervical region: Secondary | ICD-10-CM | POA: Diagnosis not present

## 2021-05-30 DIAGNOSIS — E1122 Type 2 diabetes mellitus with diabetic chronic kidney disease: Secondary | ICD-10-CM | POA: Diagnosis not present

## 2021-05-30 DIAGNOSIS — I700 Atherosclerosis of aorta: Secondary | ICD-10-CM | POA: Diagnosis not present

## 2021-05-30 DIAGNOSIS — E11621 Type 2 diabetes mellitus with foot ulcer: Secondary | ICD-10-CM | POA: Diagnosis not present

## 2021-05-30 DIAGNOSIS — E785 Hyperlipidemia, unspecified: Secondary | ICD-10-CM | POA: Diagnosis not present

## 2021-05-30 DIAGNOSIS — K219 Gastro-esophageal reflux disease without esophagitis: Secondary | ICD-10-CM | POA: Diagnosis not present

## 2021-06-02 DIAGNOSIS — M19041 Primary osteoarthritis, right hand: Secondary | ICD-10-CM | POA: Diagnosis not present

## 2021-06-02 DIAGNOSIS — K219 Gastro-esophageal reflux disease without esophagitis: Secondary | ICD-10-CM | POA: Diagnosis not present

## 2021-06-02 DIAGNOSIS — D509 Iron deficiency anemia, unspecified: Secondary | ICD-10-CM | POA: Diagnosis not present

## 2021-06-02 DIAGNOSIS — M24542 Contracture, left hand: Secondary | ICD-10-CM | POA: Diagnosis not present

## 2021-06-02 DIAGNOSIS — N3281 Overactive bladder: Secondary | ICD-10-CM | POA: Diagnosis not present

## 2021-06-02 DIAGNOSIS — E1122 Type 2 diabetes mellitus with diabetic chronic kidney disease: Secondary | ICD-10-CM | POA: Diagnosis not present

## 2021-06-02 DIAGNOSIS — N1831 Chronic kidney disease, stage 3a: Secondary | ICD-10-CM | POA: Diagnosis not present

## 2021-06-02 DIAGNOSIS — D631 Anemia in chronic kidney disease: Secondary | ICD-10-CM | POA: Diagnosis not present

## 2021-06-02 DIAGNOSIS — I130 Hypertensive heart and chronic kidney disease with heart failure and stage 1 through stage 4 chronic kidney disease, or unspecified chronic kidney disease: Secondary | ICD-10-CM | POA: Diagnosis not present

## 2021-06-02 DIAGNOSIS — I5022 Chronic systolic (congestive) heart failure: Secondary | ICD-10-CM | POA: Diagnosis not present

## 2021-06-02 DIAGNOSIS — R338 Other retention of urine: Secondary | ICD-10-CM | POA: Diagnosis not present

## 2021-06-02 DIAGNOSIS — K5790 Diverticulosis of intestine, part unspecified, without perforation or abscess without bleeding: Secondary | ICD-10-CM | POA: Diagnosis not present

## 2021-06-02 DIAGNOSIS — E785 Hyperlipidemia, unspecified: Secondary | ICD-10-CM | POA: Diagnosis not present

## 2021-06-02 DIAGNOSIS — E1142 Type 2 diabetes mellitus with diabetic polyneuropathy: Secondary | ICD-10-CM | POA: Diagnosis not present

## 2021-06-02 DIAGNOSIS — L97421 Non-pressure chronic ulcer of left heel and midfoot limited to breakdown of skin: Secondary | ICD-10-CM | POA: Diagnosis not present

## 2021-06-02 DIAGNOSIS — H913 Deaf nonspeaking, not elsewhere classified: Secondary | ICD-10-CM | POA: Diagnosis not present

## 2021-06-02 DIAGNOSIS — E11621 Type 2 diabetes mellitus with foot ulcer: Secondary | ICD-10-CM | POA: Diagnosis not present

## 2021-06-02 DIAGNOSIS — M24541 Contracture, right hand: Secondary | ICD-10-CM | POA: Diagnosis not present

## 2021-06-02 DIAGNOSIS — I2510 Atherosclerotic heart disease of native coronary artery without angina pectoris: Secondary | ICD-10-CM | POA: Diagnosis not present

## 2021-06-02 DIAGNOSIS — I252 Old myocardial infarction: Secondary | ICD-10-CM | POA: Diagnosis not present

## 2021-06-02 DIAGNOSIS — I700 Atherosclerosis of aorta: Secondary | ICD-10-CM | POA: Diagnosis not present

## 2021-06-02 DIAGNOSIS — E1151 Type 2 diabetes mellitus with diabetic peripheral angiopathy without gangrene: Secondary | ICD-10-CM | POA: Diagnosis not present

## 2021-06-02 DIAGNOSIS — M47812 Spondylosis without myelopathy or radiculopathy, cervical region: Secondary | ICD-10-CM | POA: Diagnosis not present

## 2021-06-03 DIAGNOSIS — I130 Hypertensive heart and chronic kidney disease with heart failure and stage 1 through stage 4 chronic kidney disease, or unspecified chronic kidney disease: Secondary | ICD-10-CM | POA: Diagnosis not present

## 2021-06-03 DIAGNOSIS — R338 Other retention of urine: Secondary | ICD-10-CM | POA: Diagnosis not present

## 2021-06-03 DIAGNOSIS — D509 Iron deficiency anemia, unspecified: Secondary | ICD-10-CM | POA: Diagnosis not present

## 2021-06-03 DIAGNOSIS — I2510 Atherosclerotic heart disease of native coronary artery without angina pectoris: Secondary | ICD-10-CM | POA: Diagnosis not present

## 2021-06-03 DIAGNOSIS — N1831 Chronic kidney disease, stage 3a: Secondary | ICD-10-CM | POA: Diagnosis not present

## 2021-06-03 DIAGNOSIS — L97421 Non-pressure chronic ulcer of left heel and midfoot limited to breakdown of skin: Secondary | ICD-10-CM | POA: Diagnosis not present

## 2021-06-03 DIAGNOSIS — M47812 Spondylosis without myelopathy or radiculopathy, cervical region: Secondary | ICD-10-CM | POA: Diagnosis not present

## 2021-06-03 DIAGNOSIS — E785 Hyperlipidemia, unspecified: Secondary | ICD-10-CM | POA: Diagnosis not present

## 2021-06-03 DIAGNOSIS — H913 Deaf nonspeaking, not elsewhere classified: Secondary | ICD-10-CM | POA: Diagnosis not present

## 2021-06-03 DIAGNOSIS — N3281 Overactive bladder: Secondary | ICD-10-CM | POA: Diagnosis not present

## 2021-06-03 DIAGNOSIS — K5790 Diverticulosis of intestine, part unspecified, without perforation or abscess without bleeding: Secondary | ICD-10-CM | POA: Diagnosis not present

## 2021-06-03 DIAGNOSIS — E1151 Type 2 diabetes mellitus with diabetic peripheral angiopathy without gangrene: Secondary | ICD-10-CM | POA: Diagnosis not present

## 2021-06-03 DIAGNOSIS — M24541 Contracture, right hand: Secondary | ICD-10-CM | POA: Diagnosis not present

## 2021-06-03 DIAGNOSIS — I252 Old myocardial infarction: Secondary | ICD-10-CM | POA: Diagnosis not present

## 2021-06-03 DIAGNOSIS — I5022 Chronic systolic (congestive) heart failure: Secondary | ICD-10-CM | POA: Diagnosis not present

## 2021-06-03 DIAGNOSIS — M24542 Contracture, left hand: Secondary | ICD-10-CM | POA: Diagnosis not present

## 2021-06-03 DIAGNOSIS — E1142 Type 2 diabetes mellitus with diabetic polyneuropathy: Secondary | ICD-10-CM | POA: Diagnosis not present

## 2021-06-03 DIAGNOSIS — I700 Atherosclerosis of aorta: Secondary | ICD-10-CM | POA: Diagnosis not present

## 2021-06-03 DIAGNOSIS — K219 Gastro-esophageal reflux disease without esophagitis: Secondary | ICD-10-CM | POA: Diagnosis not present

## 2021-06-03 DIAGNOSIS — E11621 Type 2 diabetes mellitus with foot ulcer: Secondary | ICD-10-CM | POA: Diagnosis not present

## 2021-06-03 DIAGNOSIS — D631 Anemia in chronic kidney disease: Secondary | ICD-10-CM | POA: Diagnosis not present

## 2021-06-03 DIAGNOSIS — E1122 Type 2 diabetes mellitus with diabetic chronic kidney disease: Secondary | ICD-10-CM | POA: Diagnosis not present

## 2021-06-03 DIAGNOSIS — M19041 Primary osteoarthritis, right hand: Secondary | ICD-10-CM | POA: Diagnosis not present

## 2021-06-06 DIAGNOSIS — I700 Atherosclerosis of aorta: Secondary | ICD-10-CM | POA: Diagnosis not present

## 2021-06-06 DIAGNOSIS — E1142 Type 2 diabetes mellitus with diabetic polyneuropathy: Secondary | ICD-10-CM | POA: Diagnosis not present

## 2021-06-06 DIAGNOSIS — H913 Deaf nonspeaking, not elsewhere classified: Secondary | ICD-10-CM | POA: Diagnosis not present

## 2021-06-06 DIAGNOSIS — M19041 Primary osteoarthritis, right hand: Secondary | ICD-10-CM | POA: Diagnosis not present

## 2021-06-06 DIAGNOSIS — E1151 Type 2 diabetes mellitus with diabetic peripheral angiopathy without gangrene: Secondary | ICD-10-CM | POA: Diagnosis not present

## 2021-06-06 DIAGNOSIS — N3281 Overactive bladder: Secondary | ICD-10-CM | POA: Diagnosis not present

## 2021-06-06 DIAGNOSIS — M47812 Spondylosis without myelopathy or radiculopathy, cervical region: Secondary | ICD-10-CM | POA: Diagnosis not present

## 2021-06-06 DIAGNOSIS — D631 Anemia in chronic kidney disease: Secondary | ICD-10-CM | POA: Diagnosis not present

## 2021-06-06 DIAGNOSIS — M24541 Contracture, right hand: Secondary | ICD-10-CM | POA: Diagnosis not present

## 2021-06-06 DIAGNOSIS — I5022 Chronic systolic (congestive) heart failure: Secondary | ICD-10-CM | POA: Diagnosis not present

## 2021-06-06 DIAGNOSIS — L97421 Non-pressure chronic ulcer of left heel and midfoot limited to breakdown of skin: Secondary | ICD-10-CM | POA: Diagnosis not present

## 2021-06-06 DIAGNOSIS — E11621 Type 2 diabetes mellitus with foot ulcer: Secondary | ICD-10-CM | POA: Diagnosis not present

## 2021-06-06 DIAGNOSIS — D509 Iron deficiency anemia, unspecified: Secondary | ICD-10-CM | POA: Diagnosis not present

## 2021-06-06 DIAGNOSIS — M24542 Contracture, left hand: Secondary | ICD-10-CM | POA: Diagnosis not present

## 2021-06-06 DIAGNOSIS — K219 Gastro-esophageal reflux disease without esophagitis: Secondary | ICD-10-CM | POA: Diagnosis not present

## 2021-06-06 DIAGNOSIS — I130 Hypertensive heart and chronic kidney disease with heart failure and stage 1 through stage 4 chronic kidney disease, or unspecified chronic kidney disease: Secondary | ICD-10-CM | POA: Diagnosis not present

## 2021-06-06 DIAGNOSIS — I252 Old myocardial infarction: Secondary | ICD-10-CM | POA: Diagnosis not present

## 2021-06-06 DIAGNOSIS — R338 Other retention of urine: Secondary | ICD-10-CM | POA: Diagnosis not present

## 2021-06-06 DIAGNOSIS — I2510 Atherosclerotic heart disease of native coronary artery without angina pectoris: Secondary | ICD-10-CM | POA: Diagnosis not present

## 2021-06-06 DIAGNOSIS — K5790 Diverticulosis of intestine, part unspecified, without perforation or abscess without bleeding: Secondary | ICD-10-CM | POA: Diagnosis not present

## 2021-06-06 DIAGNOSIS — E785 Hyperlipidemia, unspecified: Secondary | ICD-10-CM | POA: Diagnosis not present

## 2021-06-06 DIAGNOSIS — N1831 Chronic kidney disease, stage 3a: Secondary | ICD-10-CM | POA: Diagnosis not present

## 2021-06-06 DIAGNOSIS — E1122 Type 2 diabetes mellitus with diabetic chronic kidney disease: Secondary | ICD-10-CM | POA: Diagnosis not present

## 2021-06-08 ENCOUNTER — Other Ambulatory Visit: Payer: Self-pay | Admitting: Interventional Cardiology

## 2021-06-09 DIAGNOSIS — N3281 Overactive bladder: Secondary | ICD-10-CM | POA: Diagnosis not present

## 2021-06-09 DIAGNOSIS — M24542 Contracture, left hand: Secondary | ICD-10-CM | POA: Diagnosis not present

## 2021-06-09 DIAGNOSIS — E1142 Type 2 diabetes mellitus with diabetic polyneuropathy: Secondary | ICD-10-CM | POA: Diagnosis not present

## 2021-06-09 DIAGNOSIS — D631 Anemia in chronic kidney disease: Secondary | ICD-10-CM | POA: Diagnosis not present

## 2021-06-09 DIAGNOSIS — E1122 Type 2 diabetes mellitus with diabetic chronic kidney disease: Secondary | ICD-10-CM | POA: Diagnosis not present

## 2021-06-09 DIAGNOSIS — K5790 Diverticulosis of intestine, part unspecified, without perforation or abscess without bleeding: Secondary | ICD-10-CM | POA: Diagnosis not present

## 2021-06-09 DIAGNOSIS — H913 Deaf nonspeaking, not elsewhere classified: Secondary | ICD-10-CM | POA: Diagnosis not present

## 2021-06-09 DIAGNOSIS — E1151 Type 2 diabetes mellitus with diabetic peripheral angiopathy without gangrene: Secondary | ICD-10-CM | POA: Diagnosis not present

## 2021-06-09 DIAGNOSIS — I2510 Atherosclerotic heart disease of native coronary artery without angina pectoris: Secondary | ICD-10-CM | POA: Diagnosis not present

## 2021-06-09 DIAGNOSIS — I130 Hypertensive heart and chronic kidney disease with heart failure and stage 1 through stage 4 chronic kidney disease, or unspecified chronic kidney disease: Secondary | ICD-10-CM | POA: Diagnosis not present

## 2021-06-09 DIAGNOSIS — L97421 Non-pressure chronic ulcer of left heel and midfoot limited to breakdown of skin: Secondary | ICD-10-CM | POA: Diagnosis not present

## 2021-06-09 DIAGNOSIS — I700 Atherosclerosis of aorta: Secondary | ICD-10-CM | POA: Diagnosis not present

## 2021-06-09 DIAGNOSIS — M24541 Contracture, right hand: Secondary | ICD-10-CM | POA: Diagnosis not present

## 2021-06-09 DIAGNOSIS — M47812 Spondylosis without myelopathy or radiculopathy, cervical region: Secondary | ICD-10-CM | POA: Diagnosis not present

## 2021-06-09 DIAGNOSIS — E785 Hyperlipidemia, unspecified: Secondary | ICD-10-CM | POA: Diagnosis not present

## 2021-06-09 DIAGNOSIS — R338 Other retention of urine: Secondary | ICD-10-CM | POA: Diagnosis not present

## 2021-06-09 DIAGNOSIS — D509 Iron deficiency anemia, unspecified: Secondary | ICD-10-CM | POA: Diagnosis not present

## 2021-06-09 DIAGNOSIS — I252 Old myocardial infarction: Secondary | ICD-10-CM | POA: Diagnosis not present

## 2021-06-09 DIAGNOSIS — N1831 Chronic kidney disease, stage 3a: Secondary | ICD-10-CM | POA: Diagnosis not present

## 2021-06-09 DIAGNOSIS — I5022 Chronic systolic (congestive) heart failure: Secondary | ICD-10-CM | POA: Diagnosis not present

## 2021-06-09 DIAGNOSIS — M19041 Primary osteoarthritis, right hand: Secondary | ICD-10-CM | POA: Diagnosis not present

## 2021-06-09 DIAGNOSIS — K219 Gastro-esophageal reflux disease without esophagitis: Secondary | ICD-10-CM | POA: Diagnosis not present

## 2021-06-09 DIAGNOSIS — E11621 Type 2 diabetes mellitus with foot ulcer: Secondary | ICD-10-CM | POA: Diagnosis not present

## 2021-06-13 DIAGNOSIS — M19041 Primary osteoarthritis, right hand: Secondary | ICD-10-CM | POA: Diagnosis not present

## 2021-06-13 DIAGNOSIS — E1151 Type 2 diabetes mellitus with diabetic peripheral angiopathy without gangrene: Secondary | ICD-10-CM | POA: Diagnosis not present

## 2021-06-13 DIAGNOSIS — E11621 Type 2 diabetes mellitus with foot ulcer: Secondary | ICD-10-CM | POA: Diagnosis not present

## 2021-06-13 DIAGNOSIS — N3281 Overactive bladder: Secondary | ICD-10-CM | POA: Diagnosis not present

## 2021-06-13 DIAGNOSIS — E785 Hyperlipidemia, unspecified: Secondary | ICD-10-CM | POA: Diagnosis not present

## 2021-06-13 DIAGNOSIS — E1142 Type 2 diabetes mellitus with diabetic polyneuropathy: Secondary | ICD-10-CM | POA: Diagnosis not present

## 2021-06-13 DIAGNOSIS — M24541 Contracture, right hand: Secondary | ICD-10-CM | POA: Diagnosis not present

## 2021-06-13 DIAGNOSIS — K219 Gastro-esophageal reflux disease without esophagitis: Secondary | ICD-10-CM | POA: Diagnosis not present

## 2021-06-13 DIAGNOSIS — K5790 Diverticulosis of intestine, part unspecified, without perforation or abscess without bleeding: Secondary | ICD-10-CM | POA: Diagnosis not present

## 2021-06-13 DIAGNOSIS — E1122 Type 2 diabetes mellitus with diabetic chronic kidney disease: Secondary | ICD-10-CM | POA: Diagnosis not present

## 2021-06-13 DIAGNOSIS — R338 Other retention of urine: Secondary | ICD-10-CM | POA: Diagnosis not present

## 2021-06-13 DIAGNOSIS — N1831 Chronic kidney disease, stage 3a: Secondary | ICD-10-CM | POA: Diagnosis not present

## 2021-06-13 DIAGNOSIS — M24542 Contracture, left hand: Secondary | ICD-10-CM | POA: Diagnosis not present

## 2021-06-13 DIAGNOSIS — L97421 Non-pressure chronic ulcer of left heel and midfoot limited to breakdown of skin: Secondary | ICD-10-CM | POA: Diagnosis not present

## 2021-06-13 DIAGNOSIS — I2510 Atherosclerotic heart disease of native coronary artery without angina pectoris: Secondary | ICD-10-CM | POA: Diagnosis not present

## 2021-06-13 DIAGNOSIS — H913 Deaf nonspeaking, not elsewhere classified: Secondary | ICD-10-CM | POA: Diagnosis not present

## 2021-06-13 DIAGNOSIS — I252 Old myocardial infarction: Secondary | ICD-10-CM | POA: Diagnosis not present

## 2021-06-13 DIAGNOSIS — I700 Atherosclerosis of aorta: Secondary | ICD-10-CM | POA: Diagnosis not present

## 2021-06-13 DIAGNOSIS — I5022 Chronic systolic (congestive) heart failure: Secondary | ICD-10-CM | POA: Diagnosis not present

## 2021-06-13 DIAGNOSIS — D631 Anemia in chronic kidney disease: Secondary | ICD-10-CM | POA: Diagnosis not present

## 2021-06-13 DIAGNOSIS — I130 Hypertensive heart and chronic kidney disease with heart failure and stage 1 through stage 4 chronic kidney disease, or unspecified chronic kidney disease: Secondary | ICD-10-CM | POA: Diagnosis not present

## 2021-06-13 DIAGNOSIS — M47812 Spondylosis without myelopathy or radiculopathy, cervical region: Secondary | ICD-10-CM | POA: Diagnosis not present

## 2021-06-13 DIAGNOSIS — D509 Iron deficiency anemia, unspecified: Secondary | ICD-10-CM | POA: Diagnosis not present

## 2021-06-15 ENCOUNTER — Ambulatory Visit: Payer: Medicare Other | Admitting: Sports Medicine

## 2021-06-15 ENCOUNTER — Other Ambulatory Visit: Payer: Self-pay

## 2021-06-15 ENCOUNTER — Encounter: Payer: Self-pay | Admitting: Sports Medicine

## 2021-06-15 DIAGNOSIS — L97521 Non-pressure chronic ulcer of other part of left foot limited to breakdown of skin: Secondary | ICD-10-CM | POA: Diagnosis not present

## 2021-06-15 DIAGNOSIS — Z89432 Acquired absence of left foot: Secondary | ICD-10-CM

## 2021-06-15 DIAGNOSIS — H913 Deaf nonspeaking, not elsewhere classified: Secondary | ICD-10-CM | POA: Diagnosis not present

## 2021-06-15 DIAGNOSIS — E1142 Type 2 diabetes mellitus with diabetic polyneuropathy: Secondary | ICD-10-CM

## 2021-06-15 DIAGNOSIS — I739 Peripheral vascular disease, unspecified: Secondary | ICD-10-CM | POA: Diagnosis not present

## 2021-06-15 DIAGNOSIS — Z7409 Other reduced mobility: Secondary | ICD-10-CM | POA: Insufficient documentation

## 2021-06-15 NOTE — Progress Notes (Signed)
Subjective: Christian [name] is a 80 y.o. male patient seen in office for follow-up evaluation of left foot ulcer.  Patient is assisted by son and interpreter this visit.  Son reports that father refuses to wear shoes even in the house and his wound has started again because of rubbing states that it was once healed but he tends to walk around at home without shoes.  Patient has home nurse coming on Mondays and Thursdays doing the wound care and per the patient the nurses great that it.  Patient denies constitutional symptoms at this time. No other pedal complaints noted.  Patient Active Problem List   Diagnosis Date Noted   Decreased functional mobility and endurance 06/15/2021   Pressure injury of skin 03/15/2021   Wound cellulitis 03/12/2021   Acute-on-chronic kidney injury ([place]) 03/12/2021   Fall at home, initial encounter 09/14/2020   Stage 3a chronic kidney disease ([place]) 09/11/2020   Gastro-esophageal reflux disease with esophagitis 03/16/2020   Intractable vomiting with nausea 03/13/2020   Decreased activities of daily living (ADL) 02/26/2020   Dysphagia 02/26/2020   Impaired functional mobility, balance, gait, and endurance 02/26/2020   Acute respiratory failure with hypoxia (HCC) 10/27/2019   Sepsis ([place]) 10/21/2019   Generalized anxiety disorder 09/01/2019   Primary insomnia 09/01/2019   GERD without esophagitis 07/23/2019   Meningioma ([place]) 07/23/2019   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   Ulcer of left foot, limited to breakdown of skin ([place]) 02/07/2018   Chronic pain of both shoulders 08/09/2017   Pain in right wrist 08/09/2017   Atherosclerosis of renal artery ([place]) 04/24/2017   Type 2 diabetes mellitus without complications ([place]) 50/35/4656   Chronic left-sided low back pain with left-sided sciatica 07/24/2016   Lower extremity edema 01/28/2015   Angina pectoris ([place]) 07/08/2014   Anemia, unspecified 05/06/2014   Acute  gastric ulcer 08/27/2013   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     torsemide (DEMADEX) 20 MG tablet Take by mouth.     ACCU-CHEK SMARTVIEW test strip USE 1 STRIP TO CHECK GLUCOSE TWICE DAILY AS DIRECTED     escitalopram (LEXAPRO) 5 MG tablet Take 5 mg by mouth daily.     furosemide (LASIX) 20 MG tablet Take 1 tablet (20 mg total) by mouth daily. 30 tablet 0   HYDROcodone-acetaminophen (NORCO/VICODIN) 5-325 MG tablet Take 1 tablet by mouth 3 (three) times daily as needed for moderate pain or severe pain. 15 tablet 0   isosorbide mononitrate (IMDUR) 60 MG 24 hr tablet Take 1 tablet (60 mg total) by mouth daily. Please make yearly appt with Dr. Irish Lack for future refills. Thank you 1st attempt 30 tablet 0   LORazepam (ATIVAN) 1 MG tablet Take 1 mg by mouth daily as needed for anxiety or sleep.     Multiple Vitamin (MULTIVITAMIN) capsule Take 1 capsule by mouth daily.     mupirocin cream (BACTROBAN) 2 % Apply 1 application topically 2 (two) times daily. 15 g 0   nitroGLYCERIN (NITROSTAT) 0.4 MG SL tablet Place 1 tablet (0.4 mg total) under the tongue every 5 (five) minutes as needed for chest pain. Please keep upcoming appt in May with Dr. Irish Lack. Thank you 75 tablet 0  polyethylene glycol (MIRALAX / GLYCOLAX) 17 g packet Take 17 g by mouth daily as needed for mild constipation. 14 each 0   pravastatin (PRAVACHOL) 40 MG tablet Take 40 mg by mouth daily.     tamsulosin (FLOMAX) 0.4 MG CAPS capsule Take 0.4 mg by mouth in the morning and at bedtime.     tiZANidine (ZANAFLEX) 4 MG tablet Take 4 mg by mouth at bedtime.     [DISCONTINUED] pantoprazole (PROTONIX) 40 MG tablet Take 1 tablet (40 mg total) by mouth daily. (Patient not taking:  Reported on 03/12/2021) 90 tablet 2   Current Facility-Administered Medications on File Prior to Visit  Medication Dose Route Frequency Provider Last Rate Last Admin   cadexomer iodine (IODOSORB) 0.9 % gel   Topical Once per day on Mon Wed Fri Duda, Marcus V, MD       Allergies  Allergen Reactions   Lipitor [Atorvastatin] Other (See Comments)    unknown    No results found for this or any previous visit (from the past 2160 hour(s)).   Objective: There were no vitals filed for this visit.  General: Patient is awake, alert, oriented x 3 and in no acute distress.  Dermatology: Skin is warm and dry bilateral with a partial thickness ulceration present plantar left foot TMA stump site . Ulceration measures 2 1 cm x 0.8 cm x 0.2 cm. There is a keratotic border with a granular base with macerated periwound skin.  The ulceration does not probe to bone. There is no malodor, no active drainage, minimal faint erythema, +1 edema. No acute signs of infection.   Vascular: Dorsalis Pedis pulse = 1/4 Bilateral,  Posterior Tibial pulse = 0/4 Bilateral,  Capillary Fill Time < 5 seconds on right, +1 pitting edema bilateral.  Neurologic: Protective sensation absent bilateral..  Musculosketal: Prominent metatarsal heads right foot, s/p L TMA. No Pain with palpation to ulcerated area on left. No pain with compression to calves bilateral.  No results for input(s): GRAMSTAIN, LABORGA in the last 8760 hours.  Assessment and Plan:  Problem List Items Addressed This Visit       Musculoskeletal and Integument   Ulcer of left foot, limited to breakdown of skin ([place]) - Primary   Relevant Orders   WOUND CULTURE   Other Visit Diagnoses     PAD (peripheral artery disease) ([place])       Relevant Medications   torsemide (DEMADEX) 20 MG tablet   History of transmetatarsal amputation of left foot (HCC)       Diabetic polyneuropathy associated with type 2 diabetes mellitus ([place])       Relevant Medications    tiZANidine (ZANAFLEX) 4 MG tablet   Deaf mutism, acquired            -Discussed again with patient progression of wound and the importance of wearing his shoes to keep pressure off of the area to help with healing - Excisionally dedbrided ulceration at left plantar TMA stump site to healthy bleeding borders removing nonviable tissue using a sterile chisel blade. Wound measures post debridement as above.  Wound was debrided to the level of the dermis with viable wound base exposed to promote healing. Hemostasis was achieved with manuel pressure. Patient tolerated procedure well without any discomfort or anesthesia necessary for this wound debridement.  -Wound culture was obtained we will call patient if he needs antibiotics -Applied Betadine periwound and Prisma to the central wound and dry sterile dressing and instructed patient that I will  have nursing to continue with the same and to not make the dressing bulky so that way he can fit in a shoe comfortably without things rubbing; orders to be faxed to [facility] home nursing phone #[phone] fax #[phone] -Patient is scheduled to have offloading shoe or insole made on October 18; already has this appt -Patient to return to office in 2.5 weeks for follow up wound care and evaluation or sooner if problems arise.  Landis Martins, DPM

## 2021-06-16 DIAGNOSIS — L97421 Non-pressure chronic ulcer of left heel and midfoot limited to breakdown of skin: Secondary | ICD-10-CM | POA: Diagnosis not present

## 2021-06-16 DIAGNOSIS — E1142 Type 2 diabetes mellitus with diabetic polyneuropathy: Secondary | ICD-10-CM | POA: Diagnosis not present

## 2021-06-16 DIAGNOSIS — H913 Deaf nonspeaking, not elsewhere classified: Secondary | ICD-10-CM | POA: Diagnosis not present

## 2021-06-16 DIAGNOSIS — N3281 Overactive bladder: Secondary | ICD-10-CM | POA: Diagnosis not present

## 2021-06-16 DIAGNOSIS — K219 Gastro-esophageal reflux disease without esophagitis: Secondary | ICD-10-CM | POA: Diagnosis not present

## 2021-06-16 DIAGNOSIS — I700 Atherosclerosis of aorta: Secondary | ICD-10-CM | POA: Diagnosis not present

## 2021-06-16 DIAGNOSIS — D509 Iron deficiency anemia, unspecified: Secondary | ICD-10-CM | POA: Diagnosis not present

## 2021-06-16 DIAGNOSIS — M24541 Contracture, right hand: Secondary | ICD-10-CM | POA: Diagnosis not present

## 2021-06-16 DIAGNOSIS — I130 Hypertensive heart and chronic kidney disease with heart failure and stage 1 through stage 4 chronic kidney disease, or unspecified chronic kidney disease: Secondary | ICD-10-CM | POA: Diagnosis not present

## 2021-06-16 DIAGNOSIS — E785 Hyperlipidemia, unspecified: Secondary | ICD-10-CM | POA: Diagnosis not present

## 2021-06-16 DIAGNOSIS — D631 Anemia in chronic kidney disease: Secondary | ICD-10-CM | POA: Diagnosis not present

## 2021-06-16 DIAGNOSIS — E11621 Type 2 diabetes mellitus with foot ulcer: Secondary | ICD-10-CM | POA: Diagnosis not present

## 2021-06-16 DIAGNOSIS — E1151 Type 2 diabetes mellitus with diabetic peripheral angiopathy without gangrene: Secondary | ICD-10-CM | POA: Diagnosis not present

## 2021-06-16 DIAGNOSIS — I2510 Atherosclerotic heart disease of native coronary artery without angina pectoris: Secondary | ICD-10-CM | POA: Diagnosis not present

## 2021-06-16 DIAGNOSIS — N1831 Chronic kidney disease, stage 3a: Secondary | ICD-10-CM | POA: Diagnosis not present

## 2021-06-16 DIAGNOSIS — I5022 Chronic systolic (congestive) heart failure: Secondary | ICD-10-CM | POA: Diagnosis not present

## 2021-06-16 DIAGNOSIS — K5790 Diverticulosis of intestine, part unspecified, without perforation or abscess without bleeding: Secondary | ICD-10-CM | POA: Diagnosis not present

## 2021-06-16 DIAGNOSIS — E1122 Type 2 diabetes mellitus with diabetic chronic kidney disease: Secondary | ICD-10-CM | POA: Diagnosis not present

## 2021-06-16 DIAGNOSIS — M24542 Contracture, left hand: Secondary | ICD-10-CM | POA: Diagnosis not present

## 2021-06-16 DIAGNOSIS — M47812 Spondylosis without myelopathy or radiculopathy, cervical region: Secondary | ICD-10-CM | POA: Diagnosis not present

## 2021-06-16 DIAGNOSIS — I252 Old myocardial infarction: Secondary | ICD-10-CM | POA: Diagnosis not present

## 2021-06-16 DIAGNOSIS — R338 Other retention of urine: Secondary | ICD-10-CM | POA: Diagnosis not present

## 2021-06-16 DIAGNOSIS — M19041 Primary osteoarthritis, right hand: Secondary | ICD-10-CM | POA: Diagnosis not present

## 2021-06-20 ENCOUNTER — Other Ambulatory Visit: Payer: Self-pay | Admitting: Interventional Cardiology

## 2021-06-20 DIAGNOSIS — I2510 Atherosclerotic heart disease of native coronary artery without angina pectoris: Secondary | ICD-10-CM | POA: Diagnosis not present

## 2021-06-20 DIAGNOSIS — M24542 Contracture, left hand: Secondary | ICD-10-CM | POA: Diagnosis not present

## 2021-06-20 DIAGNOSIS — M24541 Contracture, right hand: Secondary | ICD-10-CM | POA: Diagnosis not present

## 2021-06-20 DIAGNOSIS — R338 Other retention of urine: Secondary | ICD-10-CM | POA: Diagnosis not present

## 2021-06-20 DIAGNOSIS — K5790 Diverticulosis of intestine, part unspecified, without perforation or abscess without bleeding: Secondary | ICD-10-CM | POA: Diagnosis not present

## 2021-06-20 DIAGNOSIS — K219 Gastro-esophageal reflux disease without esophagitis: Secondary | ICD-10-CM | POA: Diagnosis not present

## 2021-06-20 DIAGNOSIS — M47812 Spondylosis without myelopathy or radiculopathy, cervical region: Secondary | ICD-10-CM | POA: Diagnosis not present

## 2021-06-20 DIAGNOSIS — H913 Deaf nonspeaking, not elsewhere classified: Secondary | ICD-10-CM | POA: Diagnosis not present

## 2021-06-20 DIAGNOSIS — N1831 Chronic kidney disease, stage 3a: Secondary | ICD-10-CM | POA: Diagnosis not present

## 2021-06-20 DIAGNOSIS — E11621 Type 2 diabetes mellitus with foot ulcer: Secondary | ICD-10-CM | POA: Diagnosis not present

## 2021-06-20 DIAGNOSIS — E1122 Type 2 diabetes mellitus with diabetic chronic kidney disease: Secondary | ICD-10-CM | POA: Diagnosis not present

## 2021-06-20 DIAGNOSIS — N3281 Overactive bladder: Secondary | ICD-10-CM | POA: Diagnosis not present

## 2021-06-20 DIAGNOSIS — E785 Hyperlipidemia, unspecified: Secondary | ICD-10-CM | POA: Diagnosis not present

## 2021-06-20 DIAGNOSIS — I130 Hypertensive heart and chronic kidney disease with heart failure and stage 1 through stage 4 chronic kidney disease, or unspecified chronic kidney disease: Secondary | ICD-10-CM | POA: Diagnosis not present

## 2021-06-20 DIAGNOSIS — D631 Anemia in chronic kidney disease: Secondary | ICD-10-CM | POA: Diagnosis not present

## 2021-06-20 DIAGNOSIS — I700 Atherosclerosis of aorta: Secondary | ICD-10-CM | POA: Diagnosis not present

## 2021-06-20 DIAGNOSIS — I5022 Chronic systolic (congestive) heart failure: Secondary | ICD-10-CM | POA: Diagnosis not present

## 2021-06-20 DIAGNOSIS — L97421 Non-pressure chronic ulcer of left heel and midfoot limited to breakdown of skin: Secondary | ICD-10-CM | POA: Diagnosis not present

## 2021-06-20 DIAGNOSIS — E1142 Type 2 diabetes mellitus with diabetic polyneuropathy: Secondary | ICD-10-CM | POA: Diagnosis not present

## 2021-06-20 DIAGNOSIS — I252 Old myocardial infarction: Secondary | ICD-10-CM | POA: Diagnosis not present

## 2021-06-20 DIAGNOSIS — M19041 Primary osteoarthritis, right hand: Secondary | ICD-10-CM | POA: Diagnosis not present

## 2021-06-20 DIAGNOSIS — D509 Iron deficiency anemia, unspecified: Secondary | ICD-10-CM | POA: Diagnosis not present

## 2021-06-20 DIAGNOSIS — E1151 Type 2 diabetes mellitus with diabetic peripheral angiopathy without gangrene: Secondary | ICD-10-CM | POA: Diagnosis not present

## 2021-06-21 ENCOUNTER — Other Ambulatory Visit: Payer: Self-pay | Admitting: Sports Medicine

## 2021-06-21 ENCOUNTER — Telehealth: Payer: Self-pay

## 2021-06-21 LAB — WOUND CULTURE

## 2021-06-21 MED ORDER — LINEZOLID 600 MG PO TABS: 600.0000 mg | ORAL_TABLET | Freq: Two times a day (BID) | ORAL | 0 refills | Status: DC

## 2021-06-21 MED ORDER — CIPROFLOXACIN HCL 500 MG PO TABS: 500.0000 mg | ORAL_TABLET | Freq: Two times a day (BID) | ORAL | 0 refills | Status: AC

## 2021-06-21 NOTE — Telephone Encounter (Signed)
-----   Message from Landis Martins, [place] sent at 06/21/2021  6:57 AM EDT ----- Will you let patient know that I sent 2 antibiotics to his pharmacy. Wound culture came back + for MRSA (staph infection) and Enterobacter. Thanks Dr. Cannon Kettle

## 2021-06-21 NOTE — Telephone Encounter (Signed)
Spoke to sign language interpreter and advised her to let the pt know of wound cx results and 2 rxs sent to pharmacy

## 2021-06-21 NOTE — Progress Notes (Signed)
Cipro and linezolid sent for + wound culture

## 2021-06-23 DIAGNOSIS — L97421 Non-pressure chronic ulcer of left heel and midfoot limited to breakdown of skin: Secondary | ICD-10-CM | POA: Diagnosis not present

## 2021-06-23 DIAGNOSIS — I5022 Chronic systolic (congestive) heart failure: Secondary | ICD-10-CM | POA: Diagnosis not present

## 2021-06-23 DIAGNOSIS — I700 Atherosclerosis of aorta: Secondary | ICD-10-CM | POA: Diagnosis not present

## 2021-06-23 DIAGNOSIS — E1122 Type 2 diabetes mellitus with diabetic chronic kidney disease: Secondary | ICD-10-CM | POA: Diagnosis not present

## 2021-06-23 DIAGNOSIS — E1142 Type 2 diabetes mellitus with diabetic polyneuropathy: Secondary | ICD-10-CM | POA: Diagnosis not present

## 2021-06-23 DIAGNOSIS — K5790 Diverticulosis of intestine, part unspecified, without perforation or abscess without bleeding: Secondary | ICD-10-CM | POA: Diagnosis not present

## 2021-06-23 DIAGNOSIS — N1831 Chronic kidney disease, stage 3a: Secondary | ICD-10-CM | POA: Diagnosis not present

## 2021-06-23 DIAGNOSIS — D509 Iron deficiency anemia, unspecified: Secondary | ICD-10-CM | POA: Diagnosis not present

## 2021-06-23 DIAGNOSIS — M47812 Spondylosis without myelopathy or radiculopathy, cervical region: Secondary | ICD-10-CM | POA: Diagnosis not present

## 2021-06-23 DIAGNOSIS — I2510 Atherosclerotic heart disease of native coronary artery without angina pectoris: Secondary | ICD-10-CM | POA: Diagnosis not present

## 2021-06-23 DIAGNOSIS — E1151 Type 2 diabetes mellitus with diabetic peripheral angiopathy without gangrene: Secondary | ICD-10-CM | POA: Diagnosis not present

## 2021-06-23 DIAGNOSIS — I130 Hypertensive heart and chronic kidney disease with heart failure and stage 1 through stage 4 chronic kidney disease, or unspecified chronic kidney disease: Secondary | ICD-10-CM | POA: Diagnosis not present

## 2021-06-23 DIAGNOSIS — R338 Other retention of urine: Secondary | ICD-10-CM | POA: Diagnosis not present

## 2021-06-23 DIAGNOSIS — E785 Hyperlipidemia, unspecified: Secondary | ICD-10-CM | POA: Diagnosis not present

## 2021-06-23 DIAGNOSIS — H913 Deaf nonspeaking, not elsewhere classified: Secondary | ICD-10-CM | POA: Diagnosis not present

## 2021-06-23 DIAGNOSIS — M19041 Primary osteoarthritis, right hand: Secondary | ICD-10-CM | POA: Diagnosis not present

## 2021-06-23 DIAGNOSIS — N3281 Overactive bladder: Secondary | ICD-10-CM | POA: Diagnosis not present

## 2021-06-23 DIAGNOSIS — M24541 Contracture, right hand: Secondary | ICD-10-CM | POA: Diagnosis not present

## 2021-06-23 DIAGNOSIS — I252 Old myocardial infarction: Secondary | ICD-10-CM | POA: Diagnosis not present

## 2021-06-23 DIAGNOSIS — D631 Anemia in chronic kidney disease: Secondary | ICD-10-CM | POA: Diagnosis not present

## 2021-06-23 DIAGNOSIS — K219 Gastro-esophageal reflux disease without esophagitis: Secondary | ICD-10-CM | POA: Diagnosis not present

## 2021-06-23 DIAGNOSIS — E11621 Type 2 diabetes mellitus with foot ulcer: Secondary | ICD-10-CM | POA: Diagnosis not present

## 2021-06-23 DIAGNOSIS — M24542 Contracture, left hand: Secondary | ICD-10-CM | POA: Diagnosis not present

## 2021-06-27 DIAGNOSIS — D509 Iron deficiency anemia, unspecified: Secondary | ICD-10-CM | POA: Diagnosis not present

## 2021-06-27 DIAGNOSIS — D631 Anemia in chronic kidney disease: Secondary | ICD-10-CM | POA: Diagnosis not present

## 2021-06-27 DIAGNOSIS — I130 Hypertensive heart and chronic kidney disease with heart failure and stage 1 through stage 4 chronic kidney disease, or unspecified chronic kidney disease: Secondary | ICD-10-CM | POA: Diagnosis not present

## 2021-06-27 DIAGNOSIS — I700 Atherosclerosis of aorta: Secondary | ICD-10-CM | POA: Diagnosis not present

## 2021-06-27 DIAGNOSIS — K219 Gastro-esophageal reflux disease without esophagitis: Secondary | ICD-10-CM | POA: Diagnosis not present

## 2021-06-27 DIAGNOSIS — I2510 Atherosclerotic heart disease of native coronary artery without angina pectoris: Secondary | ICD-10-CM | POA: Diagnosis not present

## 2021-06-27 DIAGNOSIS — I5022 Chronic systolic (congestive) heart failure: Secondary | ICD-10-CM | POA: Diagnosis not present

## 2021-06-27 DIAGNOSIS — M47812 Spondylosis without myelopathy or radiculopathy, cervical region: Secondary | ICD-10-CM | POA: Diagnosis not present

## 2021-06-27 DIAGNOSIS — I252 Old myocardial infarction: Secondary | ICD-10-CM | POA: Diagnosis not present

## 2021-06-27 DIAGNOSIS — E11621 Type 2 diabetes mellitus with foot ulcer: Secondary | ICD-10-CM | POA: Diagnosis not present

## 2021-06-27 DIAGNOSIS — E1122 Type 2 diabetes mellitus with diabetic chronic kidney disease: Secondary | ICD-10-CM | POA: Diagnosis not present

## 2021-06-27 DIAGNOSIS — R338 Other retention of urine: Secondary | ICD-10-CM | POA: Diagnosis not present

## 2021-06-27 DIAGNOSIS — N3281 Overactive bladder: Secondary | ICD-10-CM | POA: Diagnosis not present

## 2021-06-27 DIAGNOSIS — M24541 Contracture, right hand: Secondary | ICD-10-CM | POA: Diagnosis not present

## 2021-06-27 DIAGNOSIS — N1831 Chronic kidney disease, stage 3a: Secondary | ICD-10-CM | POA: Diagnosis not present

## 2021-06-27 DIAGNOSIS — E1142 Type 2 diabetes mellitus with diabetic polyneuropathy: Secondary | ICD-10-CM | POA: Diagnosis not present

## 2021-06-27 DIAGNOSIS — M19041 Primary osteoarthritis, right hand: Secondary | ICD-10-CM | POA: Diagnosis not present

## 2021-06-27 DIAGNOSIS — M24542 Contracture, left hand: Secondary | ICD-10-CM | POA: Diagnosis not present

## 2021-06-27 DIAGNOSIS — L97421 Non-pressure chronic ulcer of left heel and midfoot limited to breakdown of skin: Secondary | ICD-10-CM | POA: Diagnosis not present

## 2021-06-27 DIAGNOSIS — K5790 Diverticulosis of intestine, part unspecified, without perforation or abscess without bleeding: Secondary | ICD-10-CM | POA: Diagnosis not present

## 2021-06-27 DIAGNOSIS — H913 Deaf nonspeaking, not elsewhere classified: Secondary | ICD-10-CM | POA: Diagnosis not present

## 2021-06-27 DIAGNOSIS — E1151 Type 2 diabetes mellitus with diabetic peripheral angiopathy without gangrene: Secondary | ICD-10-CM | POA: Diagnosis not present

## 2021-06-27 DIAGNOSIS — E785 Hyperlipidemia, unspecified: Secondary | ICD-10-CM | POA: Diagnosis not present

## 2021-06-28 DIAGNOSIS — E1142 Type 2 diabetes mellitus with diabetic polyneuropathy: Secondary | ICD-10-CM | POA: Diagnosis not present

## 2021-06-28 DIAGNOSIS — I700 Atherosclerosis of aorta: Secondary | ICD-10-CM | POA: Diagnosis not present

## 2021-06-28 DIAGNOSIS — K5790 Diverticulosis of intestine, part unspecified, without perforation or abscess without bleeding: Secondary | ICD-10-CM | POA: Diagnosis not present

## 2021-06-28 DIAGNOSIS — I5022 Chronic systolic (congestive) heart failure: Secondary | ICD-10-CM | POA: Diagnosis not present

## 2021-06-28 DIAGNOSIS — M24542 Contracture, left hand: Secondary | ICD-10-CM | POA: Diagnosis not present

## 2021-06-28 DIAGNOSIS — H913 Deaf nonspeaking, not elsewhere classified: Secondary | ICD-10-CM | POA: Diagnosis not present

## 2021-06-28 DIAGNOSIS — I2510 Atherosclerotic heart disease of native coronary artery without angina pectoris: Secondary | ICD-10-CM | POA: Diagnosis not present

## 2021-06-28 DIAGNOSIS — E11621 Type 2 diabetes mellitus with foot ulcer: Secondary | ICD-10-CM | POA: Diagnosis not present

## 2021-06-28 DIAGNOSIS — E785 Hyperlipidemia, unspecified: Secondary | ICD-10-CM | POA: Diagnosis not present

## 2021-06-28 DIAGNOSIS — D509 Iron deficiency anemia, unspecified: Secondary | ICD-10-CM | POA: Diagnosis not present

## 2021-06-28 DIAGNOSIS — M47812 Spondylosis without myelopathy or radiculopathy, cervical region: Secondary | ICD-10-CM | POA: Diagnosis not present

## 2021-06-28 DIAGNOSIS — L97421 Non-pressure chronic ulcer of left heel and midfoot limited to breakdown of skin: Secondary | ICD-10-CM | POA: Diagnosis not present

## 2021-06-28 DIAGNOSIS — I252 Old myocardial infarction: Secondary | ICD-10-CM | POA: Diagnosis not present

## 2021-06-28 DIAGNOSIS — K219 Gastro-esophageal reflux disease without esophagitis: Secondary | ICD-10-CM | POA: Diagnosis not present

## 2021-06-28 DIAGNOSIS — M19041 Primary osteoarthritis, right hand: Secondary | ICD-10-CM | POA: Diagnosis not present

## 2021-06-28 DIAGNOSIS — D631 Anemia in chronic kidney disease: Secondary | ICD-10-CM | POA: Diagnosis not present

## 2021-06-28 DIAGNOSIS — E1151 Type 2 diabetes mellitus with diabetic peripheral angiopathy without gangrene: Secondary | ICD-10-CM | POA: Diagnosis not present

## 2021-06-28 DIAGNOSIS — N1831 Chronic kidney disease, stage 3a: Secondary | ICD-10-CM | POA: Diagnosis not present

## 2021-06-28 DIAGNOSIS — I130 Hypertensive heart and chronic kidney disease with heart failure and stage 1 through stage 4 chronic kidney disease, or unspecified chronic kidney disease: Secondary | ICD-10-CM | POA: Diagnosis not present

## 2021-06-28 DIAGNOSIS — N3281 Overactive bladder: Secondary | ICD-10-CM | POA: Diagnosis not present

## 2021-06-28 DIAGNOSIS — M24541 Contracture, right hand: Secondary | ICD-10-CM | POA: Diagnosis not present

## 2021-06-28 DIAGNOSIS — E1122 Type 2 diabetes mellitus with diabetic chronic kidney disease: Secondary | ICD-10-CM | POA: Diagnosis not present

## 2021-06-28 DIAGNOSIS — R338 Other retention of urine: Secondary | ICD-10-CM | POA: Diagnosis not present

## 2021-07-01 DIAGNOSIS — I5022 Chronic systolic (congestive) heart failure: Secondary | ICD-10-CM | POA: Diagnosis not present

## 2021-07-01 DIAGNOSIS — H913 Deaf nonspeaking, not elsewhere classified: Secondary | ICD-10-CM | POA: Diagnosis not present

## 2021-07-01 DIAGNOSIS — M19041 Primary osteoarthritis, right hand: Secondary | ICD-10-CM | POA: Diagnosis not present

## 2021-07-01 DIAGNOSIS — M24541 Contracture, right hand: Secondary | ICD-10-CM | POA: Diagnosis not present

## 2021-07-01 DIAGNOSIS — E1142 Type 2 diabetes mellitus with diabetic polyneuropathy: Secondary | ICD-10-CM | POA: Diagnosis not present

## 2021-07-01 DIAGNOSIS — E11621 Type 2 diabetes mellitus with foot ulcer: Secondary | ICD-10-CM | POA: Diagnosis not present

## 2021-07-01 DIAGNOSIS — D509 Iron deficiency anemia, unspecified: Secondary | ICD-10-CM | POA: Diagnosis not present

## 2021-07-01 DIAGNOSIS — M24542 Contracture, left hand: Secondary | ICD-10-CM | POA: Diagnosis not present

## 2021-07-01 DIAGNOSIS — I252 Old myocardial infarction: Secondary | ICD-10-CM | POA: Diagnosis not present

## 2021-07-01 DIAGNOSIS — I2510 Atherosclerotic heart disease of native coronary artery without angina pectoris: Secondary | ICD-10-CM | POA: Diagnosis not present

## 2021-07-01 DIAGNOSIS — N1831 Chronic kidney disease, stage 3a: Secondary | ICD-10-CM | POA: Diagnosis not present

## 2021-07-01 DIAGNOSIS — M47812 Spondylosis without myelopathy or radiculopathy, cervical region: Secondary | ICD-10-CM | POA: Diagnosis not present

## 2021-07-01 DIAGNOSIS — E785 Hyperlipidemia, unspecified: Secondary | ICD-10-CM | POA: Diagnosis not present

## 2021-07-01 DIAGNOSIS — E1151 Type 2 diabetes mellitus with diabetic peripheral angiopathy without gangrene: Secondary | ICD-10-CM | POA: Diagnosis not present

## 2021-07-01 DIAGNOSIS — D631 Anemia in chronic kidney disease: Secondary | ICD-10-CM | POA: Diagnosis not present

## 2021-07-01 DIAGNOSIS — L97421 Non-pressure chronic ulcer of left heel and midfoot limited to breakdown of skin: Secondary | ICD-10-CM | POA: Diagnosis not present

## 2021-07-01 DIAGNOSIS — N3281 Overactive bladder: Secondary | ICD-10-CM | POA: Diagnosis not present

## 2021-07-01 DIAGNOSIS — K5790 Diverticulosis of intestine, part unspecified, without perforation or abscess without bleeding: Secondary | ICD-10-CM | POA: Diagnosis not present

## 2021-07-01 DIAGNOSIS — K219 Gastro-esophageal reflux disease without esophagitis: Secondary | ICD-10-CM | POA: Diagnosis not present

## 2021-07-01 DIAGNOSIS — I130 Hypertensive heart and chronic kidney disease with heart failure and stage 1 through stage 4 chronic kidney disease, or unspecified chronic kidney disease: Secondary | ICD-10-CM | POA: Diagnosis not present

## 2021-07-01 DIAGNOSIS — E1122 Type 2 diabetes mellitus with diabetic chronic kidney disease: Secondary | ICD-10-CM | POA: Diagnosis not present

## 2021-07-01 DIAGNOSIS — R338 Other retention of urine: Secondary | ICD-10-CM | POA: Diagnosis not present

## 2021-07-01 DIAGNOSIS — I700 Atherosclerosis of aorta: Secondary | ICD-10-CM | POA: Diagnosis not present

## 2021-07-04 DIAGNOSIS — R338 Other retention of urine: Secondary | ICD-10-CM | POA: Diagnosis not present

## 2021-07-04 DIAGNOSIS — M47812 Spondylosis without myelopathy or radiculopathy, cervical region: Secondary | ICD-10-CM | POA: Diagnosis not present

## 2021-07-04 DIAGNOSIS — H913 Deaf nonspeaking, not elsewhere classified: Secondary | ICD-10-CM | POA: Diagnosis not present

## 2021-07-04 DIAGNOSIS — I252 Old myocardial infarction: Secondary | ICD-10-CM | POA: Diagnosis not present

## 2021-07-04 DIAGNOSIS — M24542 Contracture, left hand: Secondary | ICD-10-CM | POA: Diagnosis not present

## 2021-07-04 DIAGNOSIS — E1151 Type 2 diabetes mellitus with diabetic peripheral angiopathy without gangrene: Secondary | ICD-10-CM | POA: Diagnosis not present

## 2021-07-04 DIAGNOSIS — M24541 Contracture, right hand: Secondary | ICD-10-CM | POA: Diagnosis not present

## 2021-07-04 DIAGNOSIS — M19041 Primary osteoarthritis, right hand: Secondary | ICD-10-CM | POA: Diagnosis not present

## 2021-07-04 DIAGNOSIS — I130 Hypertensive heart and chronic kidney disease with heart failure and stage 1 through stage 4 chronic kidney disease, or unspecified chronic kidney disease: Secondary | ICD-10-CM | POA: Diagnosis not present

## 2021-07-04 DIAGNOSIS — L97421 Non-pressure chronic ulcer of left heel and midfoot limited to breakdown of skin: Secondary | ICD-10-CM | POA: Diagnosis not present

## 2021-07-04 DIAGNOSIS — E1122 Type 2 diabetes mellitus with diabetic chronic kidney disease: Secondary | ICD-10-CM | POA: Diagnosis not present

## 2021-07-04 DIAGNOSIS — N3281 Overactive bladder: Secondary | ICD-10-CM | POA: Diagnosis not present

## 2021-07-04 DIAGNOSIS — N1831 Chronic kidney disease, stage 3a: Secondary | ICD-10-CM | POA: Diagnosis not present

## 2021-07-04 DIAGNOSIS — K5790 Diverticulosis of intestine, part unspecified, without perforation or abscess without bleeding: Secondary | ICD-10-CM | POA: Diagnosis not present

## 2021-07-04 DIAGNOSIS — D509 Iron deficiency anemia, unspecified: Secondary | ICD-10-CM | POA: Diagnosis not present

## 2021-07-04 DIAGNOSIS — I700 Atherosclerosis of aorta: Secondary | ICD-10-CM | POA: Diagnosis not present

## 2021-07-04 DIAGNOSIS — I2510 Atherosclerotic heart disease of native coronary artery without angina pectoris: Secondary | ICD-10-CM | POA: Diagnosis not present

## 2021-07-04 DIAGNOSIS — E1142 Type 2 diabetes mellitus with diabetic polyneuropathy: Secondary | ICD-10-CM | POA: Diagnosis not present

## 2021-07-04 DIAGNOSIS — I5022 Chronic systolic (congestive) heart failure: Secondary | ICD-10-CM | POA: Diagnosis not present

## 2021-07-04 DIAGNOSIS — D631 Anemia in chronic kidney disease: Secondary | ICD-10-CM | POA: Diagnosis not present

## 2021-07-04 DIAGNOSIS — E785 Hyperlipidemia, unspecified: Secondary | ICD-10-CM | POA: Diagnosis not present

## 2021-07-04 DIAGNOSIS — E11621 Type 2 diabetes mellitus with foot ulcer: Secondary | ICD-10-CM | POA: Diagnosis not present

## 2021-07-04 DIAGNOSIS — K219 Gastro-esophageal reflux disease without esophagitis: Secondary | ICD-10-CM | POA: Diagnosis not present

## 2021-07-05 ENCOUNTER — Encounter: Payer: Self-pay | Admitting: Sports Medicine

## 2021-07-05 ENCOUNTER — Ambulatory Visit: Payer: Medicare Other | Admitting: Sports Medicine

## 2021-07-05 ENCOUNTER — Other Ambulatory Visit: Payer: Self-pay

## 2021-07-05 DIAGNOSIS — E1142 Type 2 diabetes mellitus with diabetic polyneuropathy: Secondary | ICD-10-CM

## 2021-07-05 DIAGNOSIS — L97521 Non-pressure chronic ulcer of other part of left foot limited to breakdown of skin: Secondary | ICD-10-CM | POA: Diagnosis not present

## 2021-07-05 DIAGNOSIS — I739 Peripheral vascular disease, unspecified: Secondary | ICD-10-CM

## 2021-07-05 DIAGNOSIS — Z89432 Acquired absence of left foot: Secondary | ICD-10-CM

## 2021-07-05 NOTE — Progress Notes (Signed)
Subjective: Christian [name] is a 80 y.o. male patient seen in office for follow-up evaluation of left foot ulcer.  Patient is assisted by son and interpreter this visit.  Son reports that father is supposed to get shoes and custom insoles on 11/18.  No other pedal complaints noted.  Patient Active Problem List   Diagnosis Date Noted   Decreased functional mobility and endurance 06/15/2021   Pressure injury of skin 03/15/2021   Wound cellulitis 03/12/2021   Acute-on-chronic kidney injury ([place]) 03/12/2021   Fall at home, initial encounter 09/14/2020   Stage 3a chronic kidney disease ([place]) 09/11/2020   Gastro-esophageal reflux disease with esophagitis 03/16/2020   Intractable vomiting with nausea 03/13/2020   Decreased activities of daily living (ADL) 02/26/2020   Dysphagia 02/26/2020   Impaired functional mobility, balance, gait, and endurance 02/26/2020   Acute respiratory failure with hypoxia (HCC) 10/27/2019   Sepsis ([place]) 10/21/2019   Generalized anxiety disorder 09/01/2019   Primary insomnia 09/01/2019   GERD without esophagitis 07/23/2019   Meningioma ([place]) 07/23/2019   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   Ulcer of left foot, limited to breakdown of skin ([place]) 02/07/2018   Chronic pain of both shoulders 08/09/2017   Pain in right wrist 08/09/2017   Atherosclerosis of renal artery ([place]) 04/24/2017   Type 2 diabetes mellitus without complications ([place]) 21/30/8657   Chronic left-sided low back pain with left-sided sciatica 07/24/2016   Lower extremity edema 01/28/2015   Angina pectoris ([place]) 07/08/2014   Anemia, unspecified 05/06/2014   Acute gastric ulcer 08/27/2013   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     furosemide (LASIX) 20 MG tablet Take 1 tablet (20 mg total) by mouth daily. 30 tablet 0   HYDROcodone-acetaminophen (NORCO/VICODIN) 5-325 MG tablet Take 1 tablet by mouth 3 (three) times daily as needed for moderate pain or severe pain. 15 tablet 0   isosorbide mononitrate (IMDUR) 60 MG 24 hr tablet Take 1 tablet (60 mg total) by mouth daily. Please make yearly appt with Dr. Irish Lack for future refills. Thank you 1st attempt 30 tablet 0   linezolid (ZYVOX) 600 MG tablet Take 1 tablet (600 mg total) by mouth 2 (two) times daily. 14 tablet 0   LORazepam (ATIVAN) 1 MG tablet Take 1 mg by mouth daily as needed for anxiety or sleep.     Multiple Vitamin (MULTIVITAMIN) capsule Take 1 capsule by mouth daily.     mupirocin cream (BACTROBAN) 2 % Apply 1 application topically 2 (two) times daily. 15 g 0   nitroGLYCERIN (NITROSTAT) 0.4 MG SL tablet Place 1 tablet (0.4 mg total) under the tongue every 5 (five) minutes as needed for chest pain. Please keep upcoming appt in May with Dr. Irish Lack. Thank you 75 tablet 0   polyethylene glycol (MIRALAX / GLYCOLAX) 17 g packet Take 17 g by mouth daily as needed for mild constipation. 14 each 0   pravastatin (PRAVACHOL) 40 MG tablet Take 40 mg by mouth daily.     tamsulosin (FLOMAX) 0.4 MG CAPS capsule Take 0.4 mg by mouth in the morning and  at bedtime.     tiZANidine (ZANAFLEX) 4 MG tablet Take 4 mg by mouth at bedtime.     tiZANidine (ZANAFLEX) 4 MG tablet Take by mouth.     torsemide (DEMADEX) 20 MG tablet Take by mouth.     [DISCONTINUED] pantoprazole (PROTONIX) 40 MG tablet Take 1 tablet (40 mg total) by mouth daily. (Patient not taking: Reported on 03/12/2021) 90 tablet 2   Current Facility-Administered Medications on File Prior to Visit  Medication Dose Route Frequency Provider Last Rate Last Admin   cadexomer iodine  (IODOSORB) 0.9 % gel   Topical Once per day on Mon Wed Fri Duda, Marcus V, MD       Allergies  Allergen Reactions   Lipitor [Atorvastatin] Other (See Comments)    unknown    Recent Results (from the past 2160 hour(s))  WOUND CULTURE     Status: Abnormal   Collection Time: 06/15/21  4:34 PM   Specimen: Foot, Left; Wound   Wound Culture and sens  Result Value Ref Range   Gram Stain Result Final report    Organism ID, Bacteria Comment     Comment: No white blood cells seen.   Organism ID, Bacteria Comment     Comment: Many gram positive cocci.   Organism ID, Bacteria Comment     Comment: Few gram negative rods.   Organism ID, Bacteria Comment     Comment: Rare gram positive rods   Aerobic Bacterial Culture Final report (A)    Organism ID, Bacteria Comment (A)     Comment: Methicillin - resistant Staphylococcus aureus Based on resistance to oxacillin this isolate would be resistant to all currently available beta-lactam antimicrobial agents, with the exception of the newer cephalosporins with anti-MRSA activity, such as Ceftaroline Heavy growth    Organism ID, Bacteria Comment (A)     Comment: Enterobacter cloacae complex Heavy growth    Antimicrobial Susceptibility Comment     Comment:       ** S = Susceptible; I = Intermediate; R = Resistant **                    P = Positive; N = Negative             MICS are expressed in micrograms per mL    Antibiotic                 RSLT#1    RSLT#2    RSLT#3    RSLT#4 Amoxicillin/Clavulanic Acid              R Cefazolin                                R Cefepime                                 S Cefuroxime                               R Ciprofloxacin                  R         S Clindamycin                    R Erythromycin  R Gentamicin                     S         S Imipenem                                 S Levofloxacin                   I         S Linezolid                      S Meropenem                                 S Oxacillin                      R Penicillin                     R Rifampin                       S Tetracycline                   S         S Tobramycin                               S Trimethoprim/Sulfa             R         S Vancomycin                     S      Objective: There were no vitals filed for this visit.  General: Patient is awake, alert, oriented x 3 and in no acute distress.  Dermatology: Skin is warm and dry bilateral with a partial thickness ulceration present plantar left foot TMA stump site . Ulceration measures  1 cm x 1 cm x 0.2 cm. There is a keratotic border with a granular base with macerated periwound skin.  The ulceration does not probe to bone. There is no malodor, no active drainage, minimal faint erythema, +1 edema. No acute signs of infection.   Vascular: Dorsalis Pedis pulse = 1/4 Bilateral,  Posterior Tibial pulse = 0/4 Bilateral,  Capillary Fill Time < 5 seconds on right, +1 pitting edema bilateral.  Neurologic: Protective sensation absent bilateral..  Musculosketal: Prominent metatarsal heads right foot, s/p L TMA. No Pain with palpation to ulcerated area on left. No pain with compression to calves bilateral.  No results for input(s): GRAMSTAIN, LABORGA in the last 8760 hours.  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)       Diabetic polyneuropathy associated with type 2 diabetes mellitus ([place])          -Re-Discussed again with patient progression of wound and the importance of wearing his shoes to keep  pressure off of the area to help with healing until he can get his custom insole and shoe - Excisionally dedbrided ulceration at left plantar TMA stump site to healthy  bleeding borders removing nonviable tissue using a sterile chisel blade. Wound measures post debridement as above.  Wound was debrided to the level of the dermis with viable wound base exposed to promote healing. Hemostasis was achieved with manuel pressure. Patient tolerated procedure well without any discomfort or anesthesia necessary for this wound debridement.  -Applied Betadine periwound and Prisma to the central wound and dry sterile dressing and instructed patient that I will have nursing to continue with the same -Patient to return to office in 3-4 weeks for follow up wound care and evaluation or sooner if problems arise.  Landis Martins, DPM

## 2021-07-07 DIAGNOSIS — E1122 Type 2 diabetes mellitus with diabetic chronic kidney disease: Secondary | ICD-10-CM | POA: Diagnosis not present

## 2021-07-07 DIAGNOSIS — E11621 Type 2 diabetes mellitus with foot ulcer: Secondary | ICD-10-CM | POA: Diagnosis not present

## 2021-07-07 DIAGNOSIS — M24541 Contracture, right hand: Secondary | ICD-10-CM | POA: Diagnosis not present

## 2021-07-07 DIAGNOSIS — I252 Old myocardial infarction: Secondary | ICD-10-CM | POA: Diagnosis not present

## 2021-07-07 DIAGNOSIS — E1142 Type 2 diabetes mellitus with diabetic polyneuropathy: Secondary | ICD-10-CM | POA: Diagnosis not present

## 2021-07-07 DIAGNOSIS — N1831 Chronic kidney disease, stage 3a: Secondary | ICD-10-CM | POA: Diagnosis not present

## 2021-07-07 DIAGNOSIS — E785 Hyperlipidemia, unspecified: Secondary | ICD-10-CM | POA: Diagnosis not present

## 2021-07-07 DIAGNOSIS — D509 Iron deficiency anemia, unspecified: Secondary | ICD-10-CM | POA: Diagnosis not present

## 2021-07-07 DIAGNOSIS — E1151 Type 2 diabetes mellitus with diabetic peripheral angiopathy without gangrene: Secondary | ICD-10-CM | POA: Diagnosis not present

## 2021-07-07 DIAGNOSIS — D631 Anemia in chronic kidney disease: Secondary | ICD-10-CM | POA: Diagnosis not present

## 2021-07-07 DIAGNOSIS — M19041 Primary osteoarthritis, right hand: Secondary | ICD-10-CM | POA: Diagnosis not present

## 2021-07-07 DIAGNOSIS — I130 Hypertensive heart and chronic kidney disease with heart failure and stage 1 through stage 4 chronic kidney disease, or unspecified chronic kidney disease: Secondary | ICD-10-CM | POA: Diagnosis not present

## 2021-07-07 DIAGNOSIS — I2510 Atherosclerotic heart disease of native coronary artery without angina pectoris: Secondary | ICD-10-CM | POA: Diagnosis not present

## 2021-07-07 DIAGNOSIS — N3281 Overactive bladder: Secondary | ICD-10-CM | POA: Diagnosis not present

## 2021-07-07 DIAGNOSIS — I5022 Chronic systolic (congestive) heart failure: Secondary | ICD-10-CM | POA: Diagnosis not present

## 2021-07-07 DIAGNOSIS — L97421 Non-pressure chronic ulcer of left heel and midfoot limited to breakdown of skin: Secondary | ICD-10-CM | POA: Diagnosis not present

## 2021-07-07 DIAGNOSIS — R338 Other retention of urine: Secondary | ICD-10-CM | POA: Diagnosis not present

## 2021-07-07 DIAGNOSIS — H913 Deaf nonspeaking, not elsewhere classified: Secondary | ICD-10-CM | POA: Diagnosis not present

## 2021-07-07 DIAGNOSIS — M24542 Contracture, left hand: Secondary | ICD-10-CM | POA: Diagnosis not present

## 2021-07-07 DIAGNOSIS — M47812 Spondylosis without myelopathy or radiculopathy, cervical region: Secondary | ICD-10-CM | POA: Diagnosis not present

## 2021-07-07 DIAGNOSIS — I700 Atherosclerosis of aorta: Secondary | ICD-10-CM | POA: Diagnosis not present

## 2021-07-07 DIAGNOSIS — K219 Gastro-esophageal reflux disease without esophagitis: Secondary | ICD-10-CM | POA: Diagnosis not present

## 2021-07-07 DIAGNOSIS — K5790 Diverticulosis of intestine, part unspecified, without perforation or abscess without bleeding: Secondary | ICD-10-CM | POA: Diagnosis not present

## 2021-07-09 IMAGING — MR MR FOOT*R* W/O CM
5 series · 39 of 40 positions shown · non-contrast
Comparison: Radiographs of 03/11/2021

CLINICAL DATA: Foot swelling. Diabetes. Plantar foot wound with
cellulitis. Fall 5 days ago.

EXAM:
MRI OF THE RIGHT FOREFOOT WITHOUT CONTRAST
TECHNIQUE: Multiplanar, multisequence MR imaging of the 03/11/2021 was
performed. No intravenous contrast was administered.

[Series 4: T1 · oblique · right · 3.0mm · 0.47mm/px · 9 of 44 slices shown (1 of 2)]
[im 1/44]
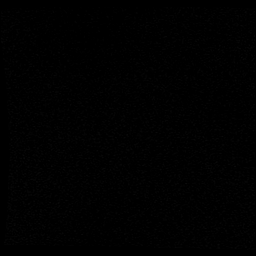
[im 6/44]
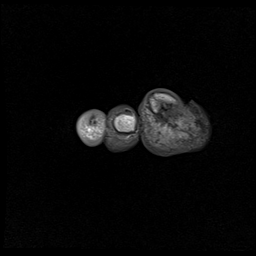
[im 11/44]
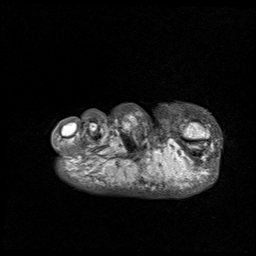
[im 17/44]
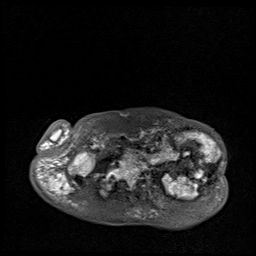
[im 22/44]
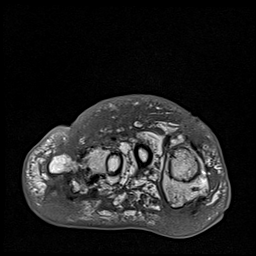
[im 27/44]
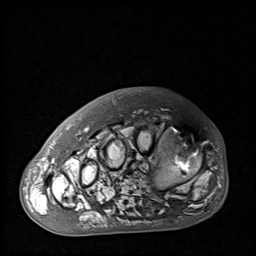
[im 33/44]
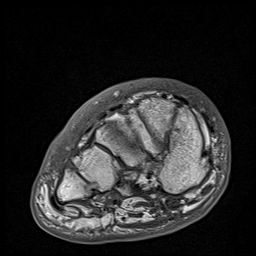
[im 38/44]
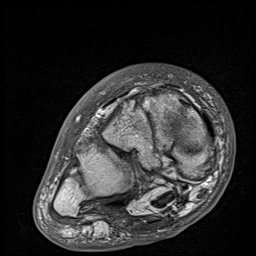
[im 44/44]
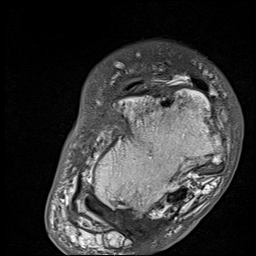

[Series 5: T2 fat-sat · oblique · right · 3.0mm · 0.44mm/px · 9 of 45 slices shown (1 of 2)]
[im 1/45]
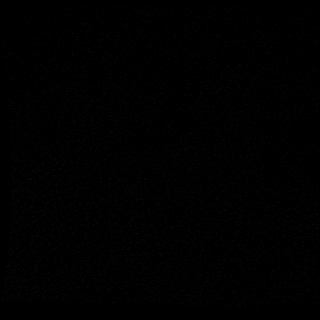
[im 5/45]
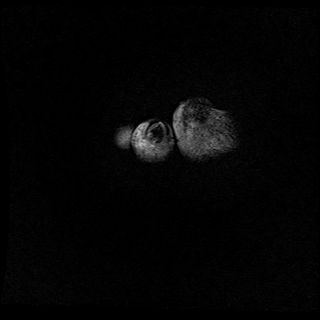
[im 10/45]
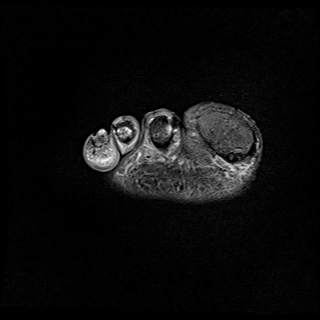
[im 15/45]
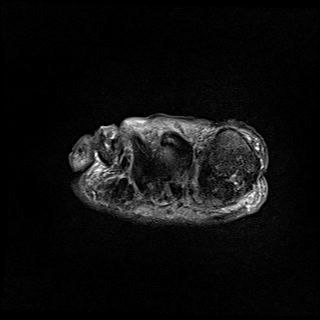
[im 20/45]
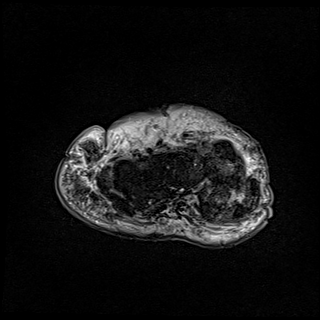
[im 25/45]
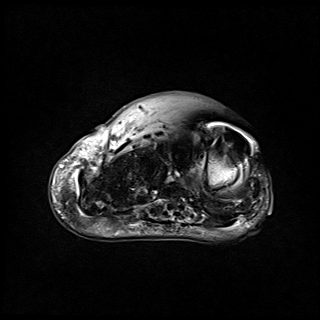
[im 30/45]
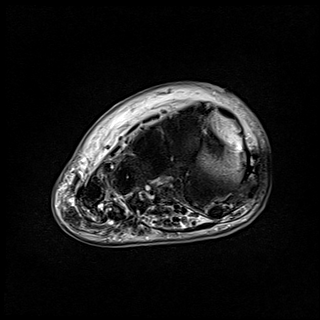
[im 40/45]
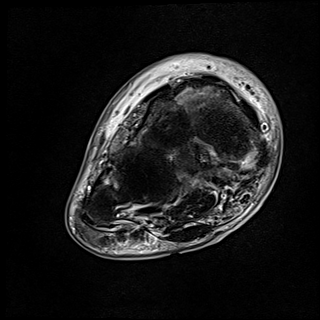
[im 45/45]
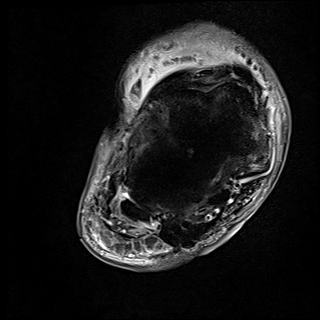

[Series 6: T2 fat-sat · oblique · right · 3.0mm · 0.78mm/px · 7 of 32 slices shown (2 of 2)]
[im 1/32]
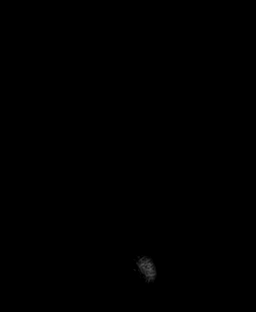
[im 6/32]
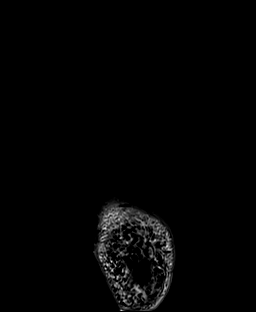
[im 11/32]
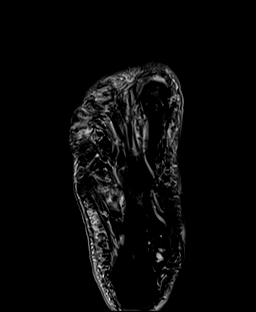
[im 16/32]
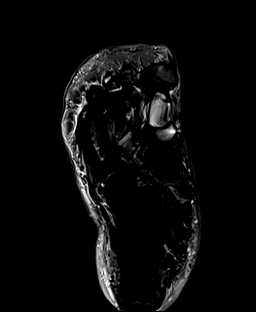
[im 21/32]
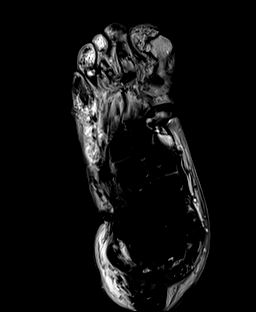
[im 26/32]
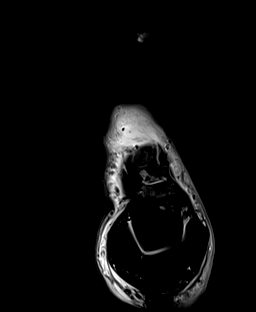
[im 32/32]
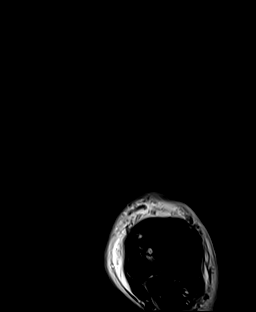

[Series 7: T1 · oblique · right · 3.0mm · 0.78mm/px · 7 of 32 slices shown (2 of 2)]
[im 1/32]
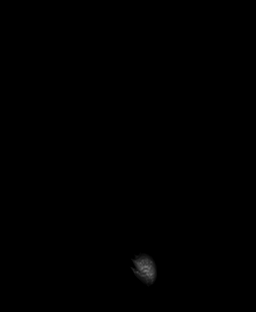
[im 6/32]
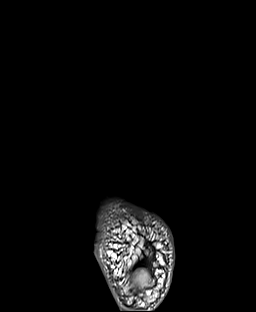
[im 11/32]
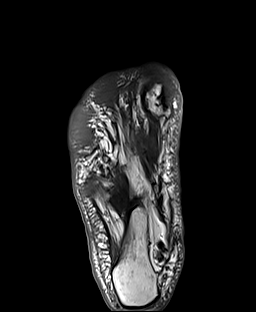
[im 16/32]
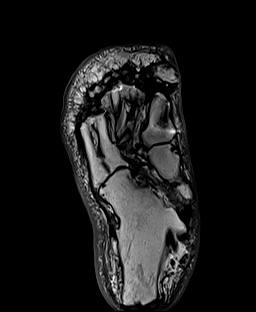
[im 21/32]
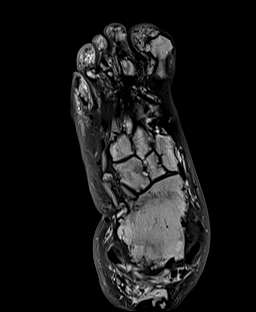
[im 26/32]
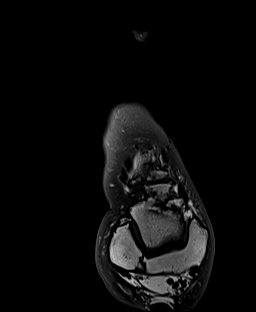
[im 32/32]
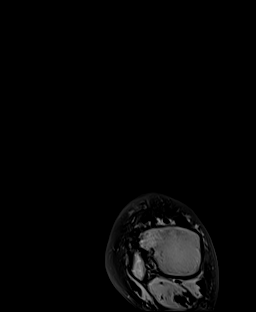

[Series 9: STIR · oblique · right · 3.0mm · 0.39mm/px · 7 of 33 slices shown]
[im 1/33]
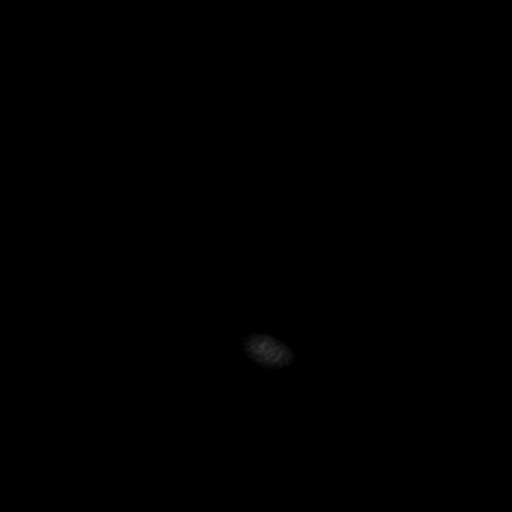
[im 6/33]
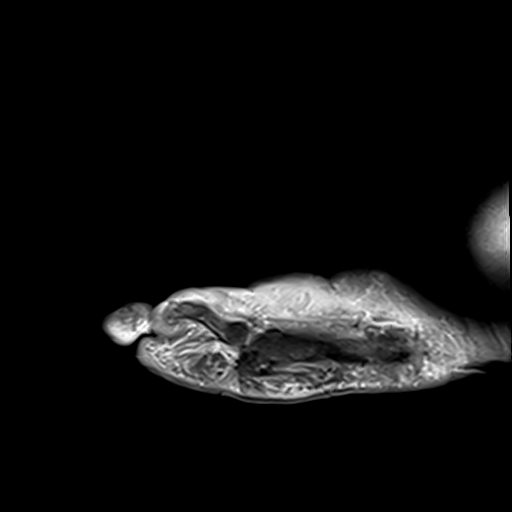
[im 11/33]
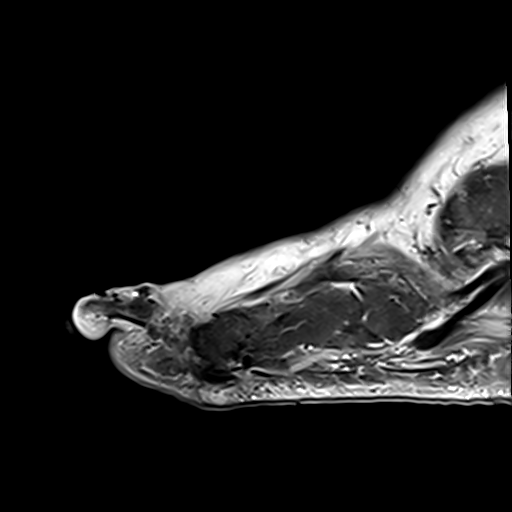
[im 17/33]
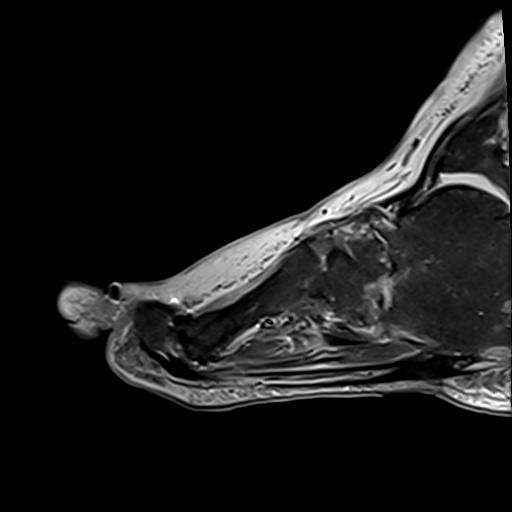
[im 22/33]
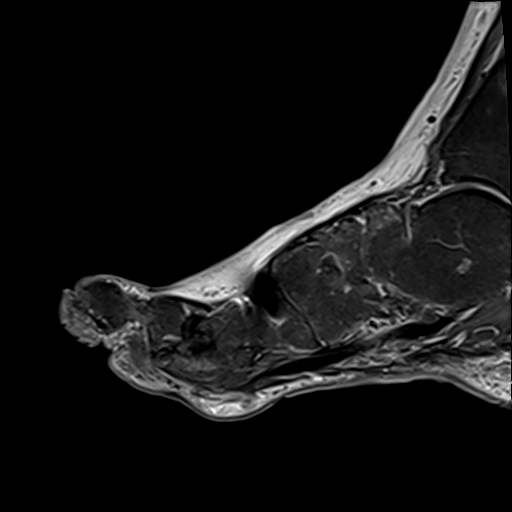
[im 27/33]
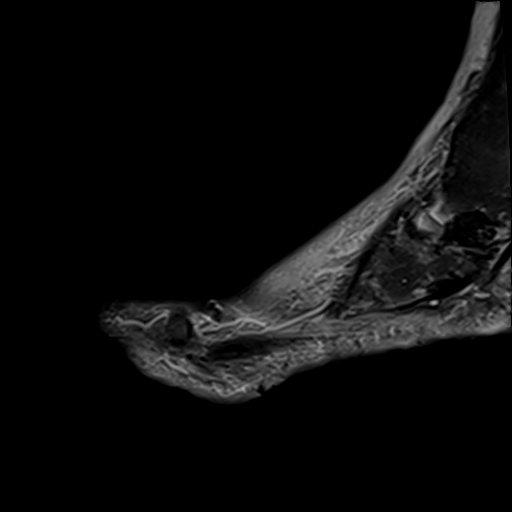
[im 33/33]
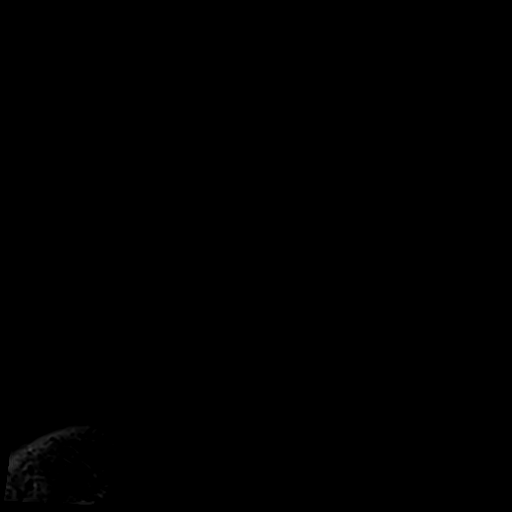

[39 of 40 positions shown; findings below may reference images not displayed]

FINDINGS: Bones/Joint/Cartilage

Bony fusion in the hindfoot including the talus, calcaneus, and
cuboid. Notable midfoot degenerative spurring. Extensive deformities
in the metatarsals with distal truncation and bony bridging between
the metatarsals as well as chronic deformities of the proximal
phalanges and fusion of the phalanges. Metal artifact in the
remaining portion of the first metatarsal due to cerclage wires.
Degenerative subcortical cyst formation along the distal tibia and
distal fibula.

No significant abnormal marrow edema characteristic of osteomyelitis
or fracture. There is some low-level edema dorsally along the
spurring between the navicular and the cuneiform is a as shown for
example on image 20 series 9.

Ligaments

The Lisfranc ligament remains intact.

Muscles and Tendons

Mild peroneus longus tendinopathy distal to the peroneal tubercle.
Distal tibialis posterior tendinopathy. Focally thickened plantar
fascia for example on image 18 series 9 raising the possibility of
plantar fibromatosis.

Soft tissues

Dorsal subcutaneous edema in the foot extending into the toes. Mild
cutaneous irregularity along the plantar foot likely corresponding
to the known plantar foot wound, with low-grade subcutaneous edema
in this vicinity favoring cellulitis.
IMPRESSION: 1. Suspected cellulitis in the plantar forefoot and potentially
dorsally in the forefoot. Cutaneous irregularity in the plantar
forefoot likely from chronic wounds in this vicinity. No findings of
osteomyelitis or drainable abscess.
2. Substantial thickening of the plantar fascia potentially from
fasciitis or plantar fibromatosis.
3. Extensive bony fusion and deformity in the hindfoot and forefoot,
chronic.
4. Mild peroneus longus and tibialis posterior tendinopathy.

## 2021-07-11 DIAGNOSIS — D631 Anemia in chronic kidney disease: Secondary | ICD-10-CM | POA: Diagnosis not present

## 2021-07-11 DIAGNOSIS — K5790 Diverticulosis of intestine, part unspecified, without perforation or abscess without bleeding: Secondary | ICD-10-CM | POA: Diagnosis not present

## 2021-07-11 DIAGNOSIS — I252 Old myocardial infarction: Secondary | ICD-10-CM | POA: Diagnosis not present

## 2021-07-11 DIAGNOSIS — I700 Atherosclerosis of aorta: Secondary | ICD-10-CM | POA: Diagnosis not present

## 2021-07-11 DIAGNOSIS — M19041 Primary osteoarthritis, right hand: Secondary | ICD-10-CM | POA: Diagnosis not present

## 2021-07-11 DIAGNOSIS — N3281 Overactive bladder: Secondary | ICD-10-CM | POA: Diagnosis not present

## 2021-07-11 DIAGNOSIS — M24542 Contracture, left hand: Secondary | ICD-10-CM | POA: Diagnosis not present

## 2021-07-11 DIAGNOSIS — N1831 Chronic kidney disease, stage 3a: Secondary | ICD-10-CM | POA: Diagnosis not present

## 2021-07-11 DIAGNOSIS — K219 Gastro-esophageal reflux disease without esophagitis: Secondary | ICD-10-CM | POA: Diagnosis not present

## 2021-07-11 DIAGNOSIS — M47812 Spondylosis without myelopathy or radiculopathy, cervical region: Secondary | ICD-10-CM | POA: Diagnosis not present

## 2021-07-11 DIAGNOSIS — I2510 Atherosclerotic heart disease of native coronary artery without angina pectoris: Secondary | ICD-10-CM | POA: Diagnosis not present

## 2021-07-11 DIAGNOSIS — E1122 Type 2 diabetes mellitus with diabetic chronic kidney disease: Secondary | ICD-10-CM | POA: Diagnosis not present

## 2021-07-11 DIAGNOSIS — D509 Iron deficiency anemia, unspecified: Secondary | ICD-10-CM | POA: Diagnosis not present

## 2021-07-11 DIAGNOSIS — M24541 Contracture, right hand: Secondary | ICD-10-CM | POA: Diagnosis not present

## 2021-07-11 DIAGNOSIS — L97421 Non-pressure chronic ulcer of left heel and midfoot limited to breakdown of skin: Secondary | ICD-10-CM | POA: Diagnosis not present

## 2021-07-11 DIAGNOSIS — E11621 Type 2 diabetes mellitus with foot ulcer: Secondary | ICD-10-CM | POA: Diagnosis not present

## 2021-07-11 DIAGNOSIS — R338 Other retention of urine: Secondary | ICD-10-CM | POA: Diagnosis not present

## 2021-07-11 DIAGNOSIS — H913 Deaf nonspeaking, not elsewhere classified: Secondary | ICD-10-CM | POA: Diagnosis not present

## 2021-07-11 DIAGNOSIS — I130 Hypertensive heart and chronic kidney disease with heart failure and stage 1 through stage 4 chronic kidney disease, or unspecified chronic kidney disease: Secondary | ICD-10-CM | POA: Diagnosis not present

## 2021-07-11 DIAGNOSIS — I5022 Chronic systolic (congestive) heart failure: Secondary | ICD-10-CM | POA: Diagnosis not present

## 2021-07-11 DIAGNOSIS — E1142 Type 2 diabetes mellitus with diabetic polyneuropathy: Secondary | ICD-10-CM | POA: Diagnosis not present

## 2021-07-11 DIAGNOSIS — E785 Hyperlipidemia, unspecified: Secondary | ICD-10-CM | POA: Diagnosis not present

## 2021-07-11 DIAGNOSIS — E1151 Type 2 diabetes mellitus with diabetic peripheral angiopathy without gangrene: Secondary | ICD-10-CM | POA: Diagnosis not present

## 2021-07-14 DIAGNOSIS — N3281 Overactive bladder: Secondary | ICD-10-CM | POA: Diagnosis not present

## 2021-07-14 DIAGNOSIS — K219 Gastro-esophageal reflux disease without esophagitis: Secondary | ICD-10-CM | POA: Diagnosis not present

## 2021-07-14 DIAGNOSIS — N1831 Chronic kidney disease, stage 3a: Secondary | ICD-10-CM | POA: Diagnosis not present

## 2021-07-14 DIAGNOSIS — E1151 Type 2 diabetes mellitus with diabetic peripheral angiopathy without gangrene: Secondary | ICD-10-CM | POA: Diagnosis not present

## 2021-07-14 DIAGNOSIS — D631 Anemia in chronic kidney disease: Secondary | ICD-10-CM | POA: Diagnosis not present

## 2021-07-14 DIAGNOSIS — E1122 Type 2 diabetes mellitus with diabetic chronic kidney disease: Secondary | ICD-10-CM | POA: Diagnosis not present

## 2021-07-14 DIAGNOSIS — I700 Atherosclerosis of aorta: Secondary | ICD-10-CM | POA: Diagnosis not present

## 2021-07-14 DIAGNOSIS — E1142 Type 2 diabetes mellitus with diabetic polyneuropathy: Secondary | ICD-10-CM | POA: Diagnosis not present

## 2021-07-14 DIAGNOSIS — M47812 Spondylosis without myelopathy or radiculopathy, cervical region: Secondary | ICD-10-CM | POA: Diagnosis not present

## 2021-07-14 DIAGNOSIS — L97421 Non-pressure chronic ulcer of left heel and midfoot limited to breakdown of skin: Secondary | ICD-10-CM | POA: Diagnosis not present

## 2021-07-14 DIAGNOSIS — K5790 Diverticulosis of intestine, part unspecified, without perforation or abscess without bleeding: Secondary | ICD-10-CM | POA: Diagnosis not present

## 2021-07-14 DIAGNOSIS — M19041 Primary osteoarthritis, right hand: Secondary | ICD-10-CM | POA: Diagnosis not present

## 2021-07-14 DIAGNOSIS — M24542 Contracture, left hand: Secondary | ICD-10-CM | POA: Diagnosis not present

## 2021-07-14 DIAGNOSIS — D509 Iron deficiency anemia, unspecified: Secondary | ICD-10-CM | POA: Diagnosis not present

## 2021-07-14 DIAGNOSIS — I5022 Chronic systolic (congestive) heart failure: Secondary | ICD-10-CM | POA: Diagnosis not present

## 2021-07-14 DIAGNOSIS — E785 Hyperlipidemia, unspecified: Secondary | ICD-10-CM | POA: Diagnosis not present

## 2021-07-14 DIAGNOSIS — H913 Deaf nonspeaking, not elsewhere classified: Secondary | ICD-10-CM | POA: Diagnosis not present

## 2021-07-14 DIAGNOSIS — I2510 Atherosclerotic heart disease of native coronary artery without angina pectoris: Secondary | ICD-10-CM | POA: Diagnosis not present

## 2021-07-14 DIAGNOSIS — I252 Old myocardial infarction: Secondary | ICD-10-CM | POA: Diagnosis not present

## 2021-07-14 DIAGNOSIS — R338 Other retention of urine: Secondary | ICD-10-CM | POA: Diagnosis not present

## 2021-07-14 DIAGNOSIS — I130 Hypertensive heart and chronic kidney disease with heart failure and stage 1 through stage 4 chronic kidney disease, or unspecified chronic kidney disease: Secondary | ICD-10-CM | POA: Diagnosis not present

## 2021-07-14 DIAGNOSIS — E11621 Type 2 diabetes mellitus with foot ulcer: Secondary | ICD-10-CM | POA: Diagnosis not present

## 2021-07-14 DIAGNOSIS — M24541 Contracture, right hand: Secondary | ICD-10-CM | POA: Diagnosis not present

## 2021-07-18 DIAGNOSIS — K219 Gastro-esophageal reflux disease without esophagitis: Secondary | ICD-10-CM | POA: Diagnosis not present

## 2021-07-18 DIAGNOSIS — I2510 Atherosclerotic heart disease of native coronary artery without angina pectoris: Secondary | ICD-10-CM | POA: Diagnosis not present

## 2021-07-18 DIAGNOSIS — N3281 Overactive bladder: Secondary | ICD-10-CM | POA: Diagnosis not present

## 2021-07-18 DIAGNOSIS — E1151 Type 2 diabetes mellitus with diabetic peripheral angiopathy without gangrene: Secondary | ICD-10-CM | POA: Diagnosis not present

## 2021-07-18 DIAGNOSIS — N1831 Chronic kidney disease, stage 3a: Secondary | ICD-10-CM | POA: Diagnosis not present

## 2021-07-18 DIAGNOSIS — H913 Deaf nonspeaking, not elsewhere classified: Secondary | ICD-10-CM | POA: Diagnosis not present

## 2021-07-18 DIAGNOSIS — E1142 Type 2 diabetes mellitus with diabetic polyneuropathy: Secondary | ICD-10-CM | POA: Diagnosis not present

## 2021-07-18 DIAGNOSIS — E1122 Type 2 diabetes mellitus with diabetic chronic kidney disease: Secondary | ICD-10-CM | POA: Diagnosis not present

## 2021-07-18 DIAGNOSIS — E11621 Type 2 diabetes mellitus with foot ulcer: Secondary | ICD-10-CM | POA: Diagnosis not present

## 2021-07-18 DIAGNOSIS — D631 Anemia in chronic kidney disease: Secondary | ICD-10-CM | POA: Diagnosis not present

## 2021-07-18 DIAGNOSIS — I252 Old myocardial infarction: Secondary | ICD-10-CM | POA: Diagnosis not present

## 2021-07-18 DIAGNOSIS — M19041 Primary osteoarthritis, right hand: Secondary | ICD-10-CM | POA: Diagnosis not present

## 2021-07-18 DIAGNOSIS — M24541 Contracture, right hand: Secondary | ICD-10-CM | POA: Diagnosis not present

## 2021-07-18 DIAGNOSIS — M24542 Contracture, left hand: Secondary | ICD-10-CM | POA: Diagnosis not present

## 2021-07-18 DIAGNOSIS — K5790 Diverticulosis of intestine, part unspecified, without perforation or abscess without bleeding: Secondary | ICD-10-CM | POA: Diagnosis not present

## 2021-07-18 DIAGNOSIS — I130 Hypertensive heart and chronic kidney disease with heart failure and stage 1 through stage 4 chronic kidney disease, or unspecified chronic kidney disease: Secondary | ICD-10-CM | POA: Diagnosis not present

## 2021-07-18 DIAGNOSIS — L97421 Non-pressure chronic ulcer of left heel and midfoot limited to breakdown of skin: Secondary | ICD-10-CM | POA: Diagnosis not present

## 2021-07-18 DIAGNOSIS — M47812 Spondylosis without myelopathy or radiculopathy, cervical region: Secondary | ICD-10-CM | POA: Diagnosis not present

## 2021-07-18 DIAGNOSIS — I5022 Chronic systolic (congestive) heart failure: Secondary | ICD-10-CM | POA: Diagnosis not present

## 2021-07-18 DIAGNOSIS — E785 Hyperlipidemia, unspecified: Secondary | ICD-10-CM | POA: Diagnosis not present

## 2021-07-18 DIAGNOSIS — R338 Other retention of urine: Secondary | ICD-10-CM | POA: Diagnosis not present

## 2021-07-18 DIAGNOSIS — D509 Iron deficiency anemia, unspecified: Secondary | ICD-10-CM | POA: Diagnosis not present

## 2021-07-18 DIAGNOSIS — I700 Atherosclerosis of aorta: Secondary | ICD-10-CM | POA: Diagnosis not present

## 2021-07-21 DIAGNOSIS — H913 Deaf nonspeaking, not elsewhere classified: Secondary | ICD-10-CM | POA: Diagnosis not present

## 2021-07-21 DIAGNOSIS — I130 Hypertensive heart and chronic kidney disease with heart failure and stage 1 through stage 4 chronic kidney disease, or unspecified chronic kidney disease: Secondary | ICD-10-CM | POA: Diagnosis not present

## 2021-07-21 DIAGNOSIS — D509 Iron deficiency anemia, unspecified: Secondary | ICD-10-CM | POA: Diagnosis not present

## 2021-07-21 DIAGNOSIS — N1831 Chronic kidney disease, stage 3a: Secondary | ICD-10-CM | POA: Diagnosis not present

## 2021-07-21 DIAGNOSIS — K219 Gastro-esophageal reflux disease without esophagitis: Secondary | ICD-10-CM | POA: Diagnosis not present

## 2021-07-21 DIAGNOSIS — E1142 Type 2 diabetes mellitus with diabetic polyneuropathy: Secondary | ICD-10-CM | POA: Diagnosis not present

## 2021-07-21 DIAGNOSIS — M47812 Spondylosis without myelopathy or radiculopathy, cervical region: Secondary | ICD-10-CM | POA: Diagnosis not present

## 2021-07-21 DIAGNOSIS — E11621 Type 2 diabetes mellitus with foot ulcer: Secondary | ICD-10-CM | POA: Diagnosis not present

## 2021-07-21 DIAGNOSIS — M24542 Contracture, left hand: Secondary | ICD-10-CM | POA: Diagnosis not present

## 2021-07-21 DIAGNOSIS — E785 Hyperlipidemia, unspecified: Secondary | ICD-10-CM | POA: Diagnosis not present

## 2021-07-21 DIAGNOSIS — K5790 Diverticulosis of intestine, part unspecified, without perforation or abscess without bleeding: Secondary | ICD-10-CM | POA: Diagnosis not present

## 2021-07-21 DIAGNOSIS — I700 Atherosclerosis of aorta: Secondary | ICD-10-CM | POA: Diagnosis not present

## 2021-07-21 DIAGNOSIS — E1122 Type 2 diabetes mellitus with diabetic chronic kidney disease: Secondary | ICD-10-CM | POA: Diagnosis not present

## 2021-07-21 DIAGNOSIS — M19041 Primary osteoarthritis, right hand: Secondary | ICD-10-CM | POA: Diagnosis not present

## 2021-07-21 DIAGNOSIS — N3281 Overactive bladder: Secondary | ICD-10-CM | POA: Diagnosis not present

## 2021-07-21 DIAGNOSIS — I252 Old myocardial infarction: Secondary | ICD-10-CM | POA: Diagnosis not present

## 2021-07-21 DIAGNOSIS — M24541 Contracture, right hand: Secondary | ICD-10-CM | POA: Diagnosis not present

## 2021-07-21 DIAGNOSIS — I2510 Atherosclerotic heart disease of native coronary artery without angina pectoris: Secondary | ICD-10-CM | POA: Diagnosis not present

## 2021-07-21 DIAGNOSIS — R338 Other retention of urine: Secondary | ICD-10-CM | POA: Diagnosis not present

## 2021-07-21 DIAGNOSIS — L97421 Non-pressure chronic ulcer of left heel and midfoot limited to breakdown of skin: Secondary | ICD-10-CM | POA: Diagnosis not present

## 2021-07-21 DIAGNOSIS — E1151 Type 2 diabetes mellitus with diabetic peripheral angiopathy without gangrene: Secondary | ICD-10-CM | POA: Diagnosis not present

## 2021-07-21 DIAGNOSIS — I5022 Chronic systolic (congestive) heart failure: Secondary | ICD-10-CM | POA: Diagnosis not present

## 2021-07-21 DIAGNOSIS — D631 Anemia in chronic kidney disease: Secondary | ICD-10-CM | POA: Diagnosis not present

## 2021-07-25 DIAGNOSIS — K5790 Diverticulosis of intestine, part unspecified, without perforation or abscess without bleeding: Secondary | ICD-10-CM | POA: Diagnosis not present

## 2021-07-25 DIAGNOSIS — M47812 Spondylosis without myelopathy or radiculopathy, cervical region: Secondary | ICD-10-CM | POA: Diagnosis not present

## 2021-07-25 DIAGNOSIS — E1122 Type 2 diabetes mellitus with diabetic chronic kidney disease: Secondary | ICD-10-CM | POA: Diagnosis not present

## 2021-07-25 DIAGNOSIS — E11621 Type 2 diabetes mellitus with foot ulcer: Secondary | ICD-10-CM | POA: Diagnosis not present

## 2021-07-25 DIAGNOSIS — I252 Old myocardial infarction: Secondary | ICD-10-CM | POA: Diagnosis not present

## 2021-07-25 DIAGNOSIS — L97421 Non-pressure chronic ulcer of left heel and midfoot limited to breakdown of skin: Secondary | ICD-10-CM | POA: Diagnosis not present

## 2021-07-25 DIAGNOSIS — M19041 Primary osteoarthritis, right hand: Secondary | ICD-10-CM | POA: Diagnosis not present

## 2021-07-25 DIAGNOSIS — E1142 Type 2 diabetes mellitus with diabetic polyneuropathy: Secondary | ICD-10-CM | POA: Diagnosis not present

## 2021-07-25 DIAGNOSIS — H913 Deaf nonspeaking, not elsewhere classified: Secondary | ICD-10-CM | POA: Diagnosis not present

## 2021-07-25 DIAGNOSIS — M24541 Contracture, right hand: Secondary | ICD-10-CM | POA: Diagnosis not present

## 2021-07-25 DIAGNOSIS — E785 Hyperlipidemia, unspecified: Secondary | ICD-10-CM | POA: Diagnosis not present

## 2021-07-25 DIAGNOSIS — I5022 Chronic systolic (congestive) heart failure: Secondary | ICD-10-CM | POA: Diagnosis not present

## 2021-07-25 DIAGNOSIS — E1151 Type 2 diabetes mellitus with diabetic peripheral angiopathy without gangrene: Secondary | ICD-10-CM | POA: Diagnosis not present

## 2021-07-25 DIAGNOSIS — I130 Hypertensive heart and chronic kidney disease with heart failure and stage 1 through stage 4 chronic kidney disease, or unspecified chronic kidney disease: Secondary | ICD-10-CM | POA: Diagnosis not present

## 2021-07-25 DIAGNOSIS — R338 Other retention of urine: Secondary | ICD-10-CM | POA: Diagnosis not present

## 2021-07-25 DIAGNOSIS — I700 Atherosclerosis of aorta: Secondary | ICD-10-CM | POA: Diagnosis not present

## 2021-07-25 DIAGNOSIS — M24542 Contracture, left hand: Secondary | ICD-10-CM | POA: Diagnosis not present

## 2021-07-25 DIAGNOSIS — N3281 Overactive bladder: Secondary | ICD-10-CM | POA: Diagnosis not present

## 2021-07-25 DIAGNOSIS — I2510 Atherosclerotic heart disease of native coronary artery without angina pectoris: Secondary | ICD-10-CM | POA: Diagnosis not present

## 2021-07-25 DIAGNOSIS — D631 Anemia in chronic kidney disease: Secondary | ICD-10-CM | POA: Diagnosis not present

## 2021-07-25 DIAGNOSIS — K219 Gastro-esophageal reflux disease without esophagitis: Secondary | ICD-10-CM | POA: Diagnosis not present

## 2021-07-25 DIAGNOSIS — N1831 Chronic kidney disease, stage 3a: Secondary | ICD-10-CM | POA: Diagnosis not present

## 2021-07-25 DIAGNOSIS — D509 Iron deficiency anemia, unspecified: Secondary | ICD-10-CM | POA: Diagnosis not present

## 2021-07-28 ENCOUNTER — Other Ambulatory Visit: Payer: Self-pay | Admitting: Interventional Cardiology

## 2021-07-29 DIAGNOSIS — M24541 Contracture, right hand: Secondary | ICD-10-CM | POA: Diagnosis not present

## 2021-07-29 DIAGNOSIS — N1831 Chronic kidney disease, stage 3a: Secondary | ICD-10-CM | POA: Diagnosis not present

## 2021-07-29 DIAGNOSIS — I700 Atherosclerosis of aorta: Secondary | ICD-10-CM | POA: Diagnosis not present

## 2021-07-29 DIAGNOSIS — E1142 Type 2 diabetes mellitus with diabetic polyneuropathy: Secondary | ICD-10-CM | POA: Diagnosis not present

## 2021-07-29 DIAGNOSIS — E785 Hyperlipidemia, unspecified: Secondary | ICD-10-CM | POA: Diagnosis not present

## 2021-07-29 DIAGNOSIS — D631 Anemia in chronic kidney disease: Secondary | ICD-10-CM | POA: Diagnosis not present

## 2021-07-29 DIAGNOSIS — E1122 Type 2 diabetes mellitus with diabetic chronic kidney disease: Secondary | ICD-10-CM | POA: Diagnosis not present

## 2021-07-29 DIAGNOSIS — H913 Deaf nonspeaking, not elsewhere classified: Secondary | ICD-10-CM | POA: Diagnosis not present

## 2021-07-29 DIAGNOSIS — D509 Iron deficiency anemia, unspecified: Secondary | ICD-10-CM | POA: Diagnosis not present

## 2021-07-29 DIAGNOSIS — I130 Hypertensive heart and chronic kidney disease with heart failure and stage 1 through stage 4 chronic kidney disease, or unspecified chronic kidney disease: Secondary | ICD-10-CM | POA: Diagnosis not present

## 2021-07-29 DIAGNOSIS — I5022 Chronic systolic (congestive) heart failure: Secondary | ICD-10-CM | POA: Diagnosis not present

## 2021-07-29 DIAGNOSIS — L97421 Non-pressure chronic ulcer of left heel and midfoot limited to breakdown of skin: Secondary | ICD-10-CM | POA: Diagnosis not present

## 2021-07-29 DIAGNOSIS — R338 Other retention of urine: Secondary | ICD-10-CM | POA: Diagnosis not present

## 2021-07-29 DIAGNOSIS — E11621 Type 2 diabetes mellitus with foot ulcer: Secondary | ICD-10-CM | POA: Diagnosis not present

## 2021-07-29 DIAGNOSIS — I252 Old myocardial infarction: Secondary | ICD-10-CM | POA: Diagnosis not present

## 2021-07-29 DIAGNOSIS — K5790 Diverticulosis of intestine, part unspecified, without perforation or abscess without bleeding: Secondary | ICD-10-CM | POA: Diagnosis not present

## 2021-07-29 DIAGNOSIS — M19041 Primary osteoarthritis, right hand: Secondary | ICD-10-CM | POA: Diagnosis not present

## 2021-07-29 DIAGNOSIS — M24542 Contracture, left hand: Secondary | ICD-10-CM | POA: Diagnosis not present

## 2021-07-29 DIAGNOSIS — K219 Gastro-esophageal reflux disease without esophagitis: Secondary | ICD-10-CM | POA: Diagnosis not present

## 2021-07-29 DIAGNOSIS — I2510 Atherosclerotic heart disease of native coronary artery without angina pectoris: Secondary | ICD-10-CM | POA: Diagnosis not present

## 2021-07-29 DIAGNOSIS — M47812 Spondylosis without myelopathy or radiculopathy, cervical region: Secondary | ICD-10-CM | POA: Diagnosis not present

## 2021-07-29 DIAGNOSIS — N3281 Overactive bladder: Secondary | ICD-10-CM | POA: Diagnosis not present

## 2021-07-29 DIAGNOSIS — E1151 Type 2 diabetes mellitus with diabetic peripheral angiopathy without gangrene: Secondary | ICD-10-CM | POA: Diagnosis not present

## 2021-08-01 ENCOUNTER — Telehealth: Payer: Self-pay | Admitting: Sports Medicine

## 2021-08-01 DIAGNOSIS — L97421 Non-pressure chronic ulcer of left heel and midfoot limited to breakdown of skin: Secondary | ICD-10-CM | POA: Diagnosis not present

## 2021-08-01 DIAGNOSIS — I2510 Atherosclerotic heart disease of native coronary artery without angina pectoris: Secondary | ICD-10-CM | POA: Diagnosis not present

## 2021-08-01 DIAGNOSIS — D631 Anemia in chronic kidney disease: Secondary | ICD-10-CM | POA: Diagnosis not present

## 2021-08-01 DIAGNOSIS — D509 Iron deficiency anemia, unspecified: Secondary | ICD-10-CM | POA: Diagnosis not present

## 2021-08-01 DIAGNOSIS — E1142 Type 2 diabetes mellitus with diabetic polyneuropathy: Secondary | ICD-10-CM | POA: Diagnosis not present

## 2021-08-01 DIAGNOSIS — E1122 Type 2 diabetes mellitus with diabetic chronic kidney disease: Secondary | ICD-10-CM | POA: Diagnosis not present

## 2021-08-01 DIAGNOSIS — I130 Hypertensive heart and chronic kidney disease with heart failure and stage 1 through stage 4 chronic kidney disease, or unspecified chronic kidney disease: Secondary | ICD-10-CM | POA: Diagnosis not present

## 2021-08-01 DIAGNOSIS — R338 Other retention of urine: Secondary | ICD-10-CM | POA: Diagnosis not present

## 2021-08-01 DIAGNOSIS — M47812 Spondylosis without myelopathy or radiculopathy, cervical region: Secondary | ICD-10-CM | POA: Diagnosis not present

## 2021-08-01 DIAGNOSIS — H913 Deaf nonspeaking, not elsewhere classified: Secondary | ICD-10-CM | POA: Diagnosis not present

## 2021-08-01 DIAGNOSIS — I700 Atherosclerosis of aorta: Secondary | ICD-10-CM | POA: Diagnosis not present

## 2021-08-01 DIAGNOSIS — M24542 Contracture, left hand: Secondary | ICD-10-CM | POA: Diagnosis not present

## 2021-08-01 DIAGNOSIS — E11621 Type 2 diabetes mellitus with foot ulcer: Secondary | ICD-10-CM | POA: Diagnosis not present

## 2021-08-01 DIAGNOSIS — N1831 Chronic kidney disease, stage 3a: Secondary | ICD-10-CM | POA: Diagnosis not present

## 2021-08-01 DIAGNOSIS — E1151 Type 2 diabetes mellitus with diabetic peripheral angiopathy without gangrene: Secondary | ICD-10-CM | POA: Diagnosis not present

## 2021-08-01 DIAGNOSIS — I5022 Chronic systolic (congestive) heart failure: Secondary | ICD-10-CM | POA: Diagnosis not present

## 2021-08-01 DIAGNOSIS — N3281 Overactive bladder: Secondary | ICD-10-CM | POA: Diagnosis not present

## 2021-08-01 DIAGNOSIS — I252 Old myocardial infarction: Secondary | ICD-10-CM | POA: Diagnosis not present

## 2021-08-01 DIAGNOSIS — K5790 Diverticulosis of intestine, part unspecified, without perforation or abscess without bleeding: Secondary | ICD-10-CM | POA: Diagnosis not present

## 2021-08-01 DIAGNOSIS — K219 Gastro-esophageal reflux disease without esophagitis: Secondary | ICD-10-CM | POA: Diagnosis not present

## 2021-08-01 DIAGNOSIS — E785 Hyperlipidemia, unspecified: Secondary | ICD-10-CM | POA: Diagnosis not present

## 2021-08-01 DIAGNOSIS — M19041 Primary osteoarthritis, right hand: Secondary | ICD-10-CM | POA: Diagnosis not present

## 2021-08-01 DIAGNOSIS — M24541 Contracture, right hand: Secondary | ICD-10-CM | POA: Diagnosis not present

## 2021-08-01 NOTE — Telephone Encounter (Signed)
Pt's son notified/reb

## 2021-08-01 NOTE — Telephone Encounter (Signed)
Pt's son would like to know if he still needs to keep appt tomorrow.  Says he has home health still coming and he will not have diabetic shoes until after Dec. 8th (pt got them from [facility] ?)

## 2021-08-02 ENCOUNTER — Ambulatory Visit: Payer: Medicare Other | Admitting: Sports Medicine

## 2021-08-05 DIAGNOSIS — E1142 Type 2 diabetes mellitus with diabetic polyneuropathy: Secondary | ICD-10-CM | POA: Diagnosis not present

## 2021-08-05 DIAGNOSIS — I252 Old myocardial infarction: Secondary | ICD-10-CM | POA: Diagnosis not present

## 2021-08-05 DIAGNOSIS — I700 Atherosclerosis of aorta: Secondary | ICD-10-CM | POA: Diagnosis not present

## 2021-08-05 DIAGNOSIS — K219 Gastro-esophageal reflux disease without esophagitis: Secondary | ICD-10-CM | POA: Diagnosis not present

## 2021-08-05 DIAGNOSIS — I5022 Chronic systolic (congestive) heart failure: Secondary | ICD-10-CM | POA: Diagnosis not present

## 2021-08-05 DIAGNOSIS — M24541 Contracture, right hand: Secondary | ICD-10-CM | POA: Diagnosis not present

## 2021-08-05 DIAGNOSIS — E1122 Type 2 diabetes mellitus with diabetic chronic kidney disease: Secondary | ICD-10-CM | POA: Diagnosis not present

## 2021-08-05 DIAGNOSIS — L97421 Non-pressure chronic ulcer of left heel and midfoot limited to breakdown of skin: Secondary | ICD-10-CM | POA: Diagnosis not present

## 2021-08-05 DIAGNOSIS — M19041 Primary osteoarthritis, right hand: Secondary | ICD-10-CM | POA: Diagnosis not present

## 2021-08-05 DIAGNOSIS — E11621 Type 2 diabetes mellitus with foot ulcer: Secondary | ICD-10-CM | POA: Diagnosis not present

## 2021-08-05 DIAGNOSIS — K5790 Diverticulosis of intestine, part unspecified, without perforation or abscess without bleeding: Secondary | ICD-10-CM | POA: Diagnosis not present

## 2021-08-05 DIAGNOSIS — M47812 Spondylosis without myelopathy or radiculopathy, cervical region: Secondary | ICD-10-CM | POA: Diagnosis not present

## 2021-08-05 DIAGNOSIS — I130 Hypertensive heart and chronic kidney disease with heart failure and stage 1 through stage 4 chronic kidney disease, or unspecified chronic kidney disease: Secondary | ICD-10-CM | POA: Diagnosis not present

## 2021-08-05 DIAGNOSIS — N1831 Chronic kidney disease, stage 3a: Secondary | ICD-10-CM | POA: Diagnosis not present

## 2021-08-05 DIAGNOSIS — H913 Deaf nonspeaking, not elsewhere classified: Secondary | ICD-10-CM | POA: Diagnosis not present

## 2021-08-05 DIAGNOSIS — I2510 Atherosclerotic heart disease of native coronary artery without angina pectoris: Secondary | ICD-10-CM | POA: Diagnosis not present

## 2021-08-05 DIAGNOSIS — D509 Iron deficiency anemia, unspecified: Secondary | ICD-10-CM | POA: Diagnosis not present

## 2021-08-05 DIAGNOSIS — E785 Hyperlipidemia, unspecified: Secondary | ICD-10-CM | POA: Diagnosis not present

## 2021-08-05 DIAGNOSIS — R338 Other retention of urine: Secondary | ICD-10-CM | POA: Diagnosis not present

## 2021-08-05 DIAGNOSIS — E1151 Type 2 diabetes mellitus with diabetic peripheral angiopathy without gangrene: Secondary | ICD-10-CM | POA: Diagnosis not present

## 2021-08-05 DIAGNOSIS — N3281 Overactive bladder: Secondary | ICD-10-CM | POA: Diagnosis not present

## 2021-08-05 DIAGNOSIS — M24542 Contracture, left hand: Secondary | ICD-10-CM | POA: Diagnosis not present

## 2021-08-05 DIAGNOSIS — D631 Anemia in chronic kidney disease: Secondary | ICD-10-CM | POA: Diagnosis not present

## 2021-08-08 DIAGNOSIS — E1142 Type 2 diabetes mellitus with diabetic polyneuropathy: Secondary | ICD-10-CM | POA: Diagnosis not present

## 2021-08-08 DIAGNOSIS — R338 Other retention of urine: Secondary | ICD-10-CM | POA: Diagnosis not present

## 2021-08-08 DIAGNOSIS — K5790 Diverticulosis of intestine, part unspecified, without perforation or abscess without bleeding: Secondary | ICD-10-CM | POA: Diagnosis not present

## 2021-08-08 DIAGNOSIS — I2510 Atherosclerotic heart disease of native coronary artery without angina pectoris: Secondary | ICD-10-CM | POA: Diagnosis not present

## 2021-08-08 DIAGNOSIS — K219 Gastro-esophageal reflux disease without esophagitis: Secondary | ICD-10-CM | POA: Diagnosis not present

## 2021-08-08 DIAGNOSIS — E11621 Type 2 diabetes mellitus with foot ulcer: Secondary | ICD-10-CM | POA: Diagnosis not present

## 2021-08-08 DIAGNOSIS — E1151 Type 2 diabetes mellitus with diabetic peripheral angiopathy without gangrene: Secondary | ICD-10-CM | POA: Diagnosis not present

## 2021-08-08 DIAGNOSIS — M24541 Contracture, right hand: Secondary | ICD-10-CM | POA: Diagnosis not present

## 2021-08-08 DIAGNOSIS — N3281 Overactive bladder: Secondary | ICD-10-CM | POA: Diagnosis not present

## 2021-08-08 DIAGNOSIS — M24542 Contracture, left hand: Secondary | ICD-10-CM | POA: Diagnosis not present

## 2021-08-08 DIAGNOSIS — L97421 Non-pressure chronic ulcer of left heel and midfoot limited to breakdown of skin: Secondary | ICD-10-CM | POA: Diagnosis not present

## 2021-08-08 DIAGNOSIS — E1122 Type 2 diabetes mellitus with diabetic chronic kidney disease: Secondary | ICD-10-CM | POA: Diagnosis not present

## 2021-08-08 DIAGNOSIS — D509 Iron deficiency anemia, unspecified: Secondary | ICD-10-CM | POA: Diagnosis not present

## 2021-08-08 DIAGNOSIS — H913 Deaf nonspeaking, not elsewhere classified: Secondary | ICD-10-CM | POA: Diagnosis not present

## 2021-08-08 DIAGNOSIS — N1831 Chronic kidney disease, stage 3a: Secondary | ICD-10-CM | POA: Diagnosis not present

## 2021-08-08 DIAGNOSIS — I252 Old myocardial infarction: Secondary | ICD-10-CM | POA: Diagnosis not present

## 2021-08-08 DIAGNOSIS — I700 Atherosclerosis of aorta: Secondary | ICD-10-CM | POA: Diagnosis not present

## 2021-08-08 DIAGNOSIS — I130 Hypertensive heart and chronic kidney disease with heart failure and stage 1 through stage 4 chronic kidney disease, or unspecified chronic kidney disease: Secondary | ICD-10-CM | POA: Diagnosis not present

## 2021-08-08 DIAGNOSIS — D631 Anemia in chronic kidney disease: Secondary | ICD-10-CM | POA: Diagnosis not present

## 2021-08-08 DIAGNOSIS — M47812 Spondylosis without myelopathy or radiculopathy, cervical region: Secondary | ICD-10-CM | POA: Diagnosis not present

## 2021-08-08 DIAGNOSIS — I5022 Chronic systolic (congestive) heart failure: Secondary | ICD-10-CM | POA: Diagnosis not present

## 2021-08-08 DIAGNOSIS — M19041 Primary osteoarthritis, right hand: Secondary | ICD-10-CM | POA: Diagnosis not present

## 2021-08-08 DIAGNOSIS — E785 Hyperlipidemia, unspecified: Secondary | ICD-10-CM | POA: Diagnosis not present

## 2021-08-11 DIAGNOSIS — L97421 Non-pressure chronic ulcer of left heel and midfoot limited to breakdown of skin: Secondary | ICD-10-CM | POA: Diagnosis not present

## 2021-08-11 DIAGNOSIS — E1122 Type 2 diabetes mellitus with diabetic chronic kidney disease: Secondary | ICD-10-CM | POA: Diagnosis not present

## 2021-08-11 DIAGNOSIS — M24541 Contracture, right hand: Secondary | ICD-10-CM | POA: Diagnosis not present

## 2021-08-11 DIAGNOSIS — K5790 Diverticulosis of intestine, part unspecified, without perforation or abscess without bleeding: Secondary | ICD-10-CM | POA: Diagnosis not present

## 2021-08-11 DIAGNOSIS — E1151 Type 2 diabetes mellitus with diabetic peripheral angiopathy without gangrene: Secondary | ICD-10-CM | POA: Diagnosis not present

## 2021-08-11 DIAGNOSIS — N1831 Chronic kidney disease, stage 3a: Secondary | ICD-10-CM | POA: Diagnosis not present

## 2021-08-11 DIAGNOSIS — M47812 Spondylosis without myelopathy or radiculopathy, cervical region: Secondary | ICD-10-CM | POA: Diagnosis not present

## 2021-08-11 DIAGNOSIS — D631 Anemia in chronic kidney disease: Secondary | ICD-10-CM | POA: Diagnosis not present

## 2021-08-11 DIAGNOSIS — H913 Deaf nonspeaking, not elsewhere classified: Secondary | ICD-10-CM | POA: Diagnosis not present

## 2021-08-11 DIAGNOSIS — I5022 Chronic systolic (congestive) heart failure: Secondary | ICD-10-CM | POA: Diagnosis not present

## 2021-08-11 DIAGNOSIS — M24542 Contracture, left hand: Secondary | ICD-10-CM | POA: Diagnosis not present

## 2021-08-11 DIAGNOSIS — M19041 Primary osteoarthritis, right hand: Secondary | ICD-10-CM | POA: Diagnosis not present

## 2021-08-11 DIAGNOSIS — I252 Old myocardial infarction: Secondary | ICD-10-CM | POA: Diagnosis not present

## 2021-08-11 DIAGNOSIS — E785 Hyperlipidemia, unspecified: Secondary | ICD-10-CM | POA: Diagnosis not present

## 2021-08-11 DIAGNOSIS — E1142 Type 2 diabetes mellitus with diabetic polyneuropathy: Secondary | ICD-10-CM | POA: Diagnosis not present

## 2021-08-11 DIAGNOSIS — D509 Iron deficiency anemia, unspecified: Secondary | ICD-10-CM | POA: Diagnosis not present

## 2021-08-11 DIAGNOSIS — I700 Atherosclerosis of aorta: Secondary | ICD-10-CM | POA: Diagnosis not present

## 2021-08-11 DIAGNOSIS — I130 Hypertensive heart and chronic kidney disease with heart failure and stage 1 through stage 4 chronic kidney disease, or unspecified chronic kidney disease: Secondary | ICD-10-CM | POA: Diagnosis not present

## 2021-08-11 DIAGNOSIS — N3281 Overactive bladder: Secondary | ICD-10-CM | POA: Diagnosis not present

## 2021-08-11 DIAGNOSIS — R338 Other retention of urine: Secondary | ICD-10-CM | POA: Diagnosis not present

## 2021-08-11 DIAGNOSIS — K219 Gastro-esophageal reflux disease without esophagitis: Secondary | ICD-10-CM | POA: Diagnosis not present

## 2021-08-11 DIAGNOSIS — E11621 Type 2 diabetes mellitus with foot ulcer: Secondary | ICD-10-CM | POA: Diagnosis not present

## 2021-08-11 DIAGNOSIS — I2510 Atherosclerotic heart disease of native coronary artery without angina pectoris: Secondary | ICD-10-CM | POA: Diagnosis not present

## 2021-08-12 ENCOUNTER — Ambulatory Visit: Payer: Medicare Other | Admitting: Sports Medicine

## 2021-08-12 DIAGNOSIS — Z89432 Acquired absence of left foot: Secondary | ICD-10-CM

## 2021-08-12 DIAGNOSIS — L97521 Non-pressure chronic ulcer of other part of left foot limited to breakdown of skin: Secondary | ICD-10-CM | POA: Diagnosis not present

## 2021-08-12 DIAGNOSIS — I739 Peripheral vascular disease, unspecified: Secondary | ICD-10-CM

## 2021-08-12 NOTE — Progress Notes (Signed)
Subjective: Christian RIBAUDO is a 80 y.o. male patient seen in office for follow-up evaluation of left foot ulcer.  Patient is assisted by daughter and interpreter this visit.  Daughter reports that he is supposed to get his shoes on next week.  No other pedal complaints noted.  Patient Active Problem List   Diagnosis Date Noted   Decreased functional mobility and endurance 06/15/2021   Pressure injury of skin 03/15/2021   Wound cellulitis 03/12/2021   Acute-on-chronic kidney injury ([place]) 03/12/2021   Fall at home, initial encounter 09/14/2020   Stage 3a chronic kidney disease ([place]) 09/11/2020   Gastro-esophageal reflux disease with esophagitis 03/16/2020   Intractable vomiting with nausea 03/13/2020   Decreased activities of daily living (ADL) 02/26/2020   Dysphagia 02/26/2020   Impaired functional mobility, balance, gait, and endurance 02/26/2020   Acute respiratory failure with hypoxia (HCC) 10/27/2019   Sepsis ([place]) 10/21/2019   Generalized anxiety disorder 09/01/2019   Primary insomnia 09/01/2019   GERD without esophagitis 07/23/2019   Meningioma ([place]) 07/23/2019   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   Ulcer of left foot, limited to breakdown of skin ([place]) 02/07/2018   Chronic pain of both shoulders 08/09/2017   Pain in right wrist 08/09/2017   Atherosclerosis of renal artery ([place]) 04/24/2017   Type 2 diabetes mellitus without complications ([place]) 86/57/8469   Chronic left-sided low back pain with left-sided sciatica 07/24/2016   Lower extremity edema 01/28/2015   Angina pectoris ([place]) 07/08/2014   Anemia, unspecified 05/06/2014   Acute gastric ulcer 08/27/2013   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   IDA (iron deficiency anemia) 08/25/2013   Mixed hyperlipidemia 08/13/2013   Essential hypertension, benign 08/13/2013   Current Outpatient Medications on File Prior to Visit  Medication Sig Dispense Refill   ACCU-CHEK SMARTVIEW  test strip USE 1 STRIP TO CHECK GLUCOSE TWICE DAILY AS DIRECTED     escitalopram (LEXAPRO) 5 MG tablet Take 5 mg by mouth daily.     furosemide (LASIX) 20 MG tablet Take 1 tablet (20 mg total) by mouth daily. 30 tablet 0   HYDROcodone-acetaminophen (NORCO/VICODIN) 5-325 MG tablet Take 1 tablet by mouth 3 (three) times daily as needed for moderate pain or severe pain. 15 tablet 0   isosorbide mononitrate (IMDUR) 60 MG 24 hr tablet Take 1 tablet (60 mg total) by mouth daily. Please make overdue appt with Dr Irish Lack before anymore refills. Thank you 2nd attempt 15 tablet 0   linezolid (ZYVOX) 600 MG tablet Take 1 tablet (600 mg total) by mouth 2 (two) times daily. 14 tablet 0   LORazepam (ATIVAN) 1 MG tablet Take 1 mg by mouth daily as needed for anxiety or sleep.     Multiple Vitamin (MULTIVITAMIN) capsule Take 1 capsule by mouth daily.     mupirocin cream (BACTROBAN) 2 % Apply 1 application topically 2 (two) times daily. 15 g 0   nitroGLYCERIN (NITROSTAT) 0.4 MG SL tablet Place 1 tablet (0.4 mg total) under the tongue every 5 (five) minutes as needed for chest pain. Please keep upcoming appt in May with Dr. Irish Lack. Thank you 75 tablet 0   polyethylene glycol (MIRALAX / GLYCOLAX) 17 g packet Take 17 g by mouth daily as needed for mild constipation. 14 each 0   pravastatin (PRAVACHOL) 40 MG tablet Take 40 mg by mouth daily.     tamsulosin (FLOMAX) 0.4 MG CAPS capsule Take 0.4 mg by mouth in the morning and at  bedtime.     tiZANidine (ZANAFLEX) 4 MG tablet Take 4 mg by mouth at bedtime.     tiZANidine (ZANAFLEX) 4 MG tablet Take by mouth.     torsemide (DEMADEX) 20 MG tablet Take by mouth.     [DISCONTINUED] pantoprazole (PROTONIX) 40 MG tablet Take 1 tablet (40 mg total) by mouth daily. (Patient not taking: Reported on 03/12/2021) 90 tablet 2   Current Facility-Administered Medications on File Prior to Visit  Medication Dose Route Frequency Provider Last Rate Last Admin   cadexomer iodine  (IODOSORB) 0.9 % gel   Topical Once per day on Mon Wed Fri Duda, Marcus V, MD       Allergies  Allergen Reactions   Lipitor [Atorvastatin] Other (See Comments)    unknown    Recent Results (from the past 2160 hour(s))  WOUND CULTURE     Status: Abnormal   Collection Time: 06/15/21  4:34 PM   Specimen: Foot, Left; Wound   Wound Culture and sens  Result Value Ref Range   Gram Stain Result Final report    Organism ID, Bacteria Comment     Comment: No white blood cells seen.   Organism ID, Bacteria Comment     Comment: Many gram positive cocci.   Organism ID, Bacteria Comment     Comment: Few gram negative rods.   Organism ID, Bacteria Comment     Comment: Rare gram positive rods   Aerobic Bacterial Culture Final report (A)    Organism ID, Bacteria Comment (A)     Comment: Methicillin - resistant Staphylococcus aureus Based on resistance to oxacillin this isolate would be resistant to all currently available beta-lactam antimicrobial agents, with the exception of the newer cephalosporins with anti-MRSA activity, such as Ceftaroline Heavy growth    Organism ID, Bacteria Comment (A)     Comment: Enterobacter cloacae complex Heavy growth    Antimicrobial Susceptibility Comment     Comment:       ** S = Susceptible; I = Intermediate; R = Resistant **                    P = Positive; N = Negative             MICS are expressed in micrograms per mL    Antibiotic                 RSLT#1    RSLT#2    RSLT#3    RSLT#4 Amoxicillin/Clavulanic Acid              R Cefazolin                                R Cefepime                                 S Cefuroxime                               R Ciprofloxacin                  R         S Clindamycin                    R Erythromycin  R Gentamicin                     S         S Imipenem                                 S Levofloxacin                   I         S Linezolid                      S Meropenem                                 S Oxacillin                      R Penicillin                     R Rifampin                       S Tetracycline                   S         S Tobramycin                               S Trimethoprim/Sulfa             R         S Vancomycin                     S      Objective: There were no vitals filed for this visit.  General: Patient is awake, alert, oriented x 3 and in no acute distress.  Dermatology: Skin is warm and dry bilateral with a partial thickness ulceration present plantar left foot TMA stump site . Ulceration measures  1 cm x 1 cm x 0.2 cm. There is a keratotic border with a granular base with macerated periwound skin.  The ulceration does not probe to bone. There is no malodor, no active drainage, minimal faint erythema, +1 edema. No acute signs of infection.   Vascular: Dorsalis Pedis pulse = 1/4 Bilateral,  Posterior Tibial pulse = 0/4 Bilateral,  Capillary Fill Time < 5 seconds on right, +1 pitting edema bilateral.  Neurologic: Protective sensation absent bilateral..  Musculosketal: Prominent metatarsal heads right foot, s/p L TMA. No Pain with palpation to ulcerated area on left. No pain with compression to calves bilateral.  No results for input(s): GRAMSTAIN, LABORGA in the last 8760 hours.  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 Wound  measures post debridement as above.  Wound was debrided to the level of the dermis with viable wound base exposed to promote healing. Hemostasis was achieved with manuel pressure. Patient tolerated procedure well without any discomfort or anesthesia necessary for this wound debridement.  -Applied Prisma  and dry sterile dressing with home nursing to do the same -Patient to return to office in 3-4 weeks for follow up wound care and shoe check or sooner if problems arise.  Landis Martins, DPM

## 2021-08-15 DIAGNOSIS — H913 Deaf nonspeaking, not elsewhere classified: Secondary | ICD-10-CM | POA: Diagnosis not present

## 2021-08-15 DIAGNOSIS — K5790 Diverticulosis of intestine, part unspecified, without perforation or abscess without bleeding: Secondary | ICD-10-CM | POA: Diagnosis not present

## 2021-08-15 DIAGNOSIS — I700 Atherosclerosis of aorta: Secondary | ICD-10-CM | POA: Diagnosis not present

## 2021-08-15 DIAGNOSIS — I5022 Chronic systolic (congestive) heart failure: Secondary | ICD-10-CM | POA: Diagnosis not present

## 2021-08-15 DIAGNOSIS — N3281 Overactive bladder: Secondary | ICD-10-CM | POA: Diagnosis not present

## 2021-08-15 DIAGNOSIS — I2510 Atherosclerotic heart disease of native coronary artery without angina pectoris: Secondary | ICD-10-CM | POA: Diagnosis not present

## 2021-08-15 DIAGNOSIS — E1151 Type 2 diabetes mellitus with diabetic peripheral angiopathy without gangrene: Secondary | ICD-10-CM | POA: Diagnosis not present

## 2021-08-15 DIAGNOSIS — E1142 Type 2 diabetes mellitus with diabetic polyneuropathy: Secondary | ICD-10-CM | POA: Diagnosis not present

## 2021-08-15 DIAGNOSIS — N1831 Chronic kidney disease, stage 3a: Secondary | ICD-10-CM | POA: Diagnosis not present

## 2021-08-15 DIAGNOSIS — M19041 Primary osteoarthritis, right hand: Secondary | ICD-10-CM | POA: Diagnosis not present

## 2021-08-15 DIAGNOSIS — M47812 Spondylosis without myelopathy or radiculopathy, cervical region: Secondary | ICD-10-CM | POA: Diagnosis not present

## 2021-08-15 DIAGNOSIS — E785 Hyperlipidemia, unspecified: Secondary | ICD-10-CM | POA: Diagnosis not present

## 2021-08-15 DIAGNOSIS — I130 Hypertensive heart and chronic kidney disease with heart failure and stage 1 through stage 4 chronic kidney disease, or unspecified chronic kidney disease: Secondary | ICD-10-CM | POA: Diagnosis not present

## 2021-08-15 DIAGNOSIS — E11621 Type 2 diabetes mellitus with foot ulcer: Secondary | ICD-10-CM | POA: Diagnosis not present

## 2021-08-15 DIAGNOSIS — L97421 Non-pressure chronic ulcer of left heel and midfoot limited to breakdown of skin: Secondary | ICD-10-CM | POA: Diagnosis not present

## 2021-08-15 DIAGNOSIS — R338 Other retention of urine: Secondary | ICD-10-CM | POA: Diagnosis not present

## 2021-08-15 DIAGNOSIS — M24542 Contracture, left hand: Secondary | ICD-10-CM | POA: Diagnosis not present

## 2021-08-15 DIAGNOSIS — M24541 Contracture, right hand: Secondary | ICD-10-CM | POA: Diagnosis not present

## 2021-08-15 DIAGNOSIS — E1122 Type 2 diabetes mellitus with diabetic chronic kidney disease: Secondary | ICD-10-CM | POA: Diagnosis not present

## 2021-08-15 DIAGNOSIS — I252 Old myocardial infarction: Secondary | ICD-10-CM | POA: Diagnosis not present

## 2021-08-15 DIAGNOSIS — K219 Gastro-esophageal reflux disease without esophagitis: Secondary | ICD-10-CM | POA: Diagnosis not present

## 2021-08-15 DIAGNOSIS — D631 Anemia in chronic kidney disease: Secondary | ICD-10-CM | POA: Diagnosis not present

## 2021-08-15 DIAGNOSIS — D509 Iron deficiency anemia, unspecified: Secondary | ICD-10-CM | POA: Diagnosis not present

## 2021-08-18 DIAGNOSIS — S98022D Partial traumatic amputation of left foot at ankle level, subsequent encounter: Secondary | ICD-10-CM | POA: Diagnosis not present

## 2021-08-18 DIAGNOSIS — E1142 Type 2 diabetes mellitus with diabetic polyneuropathy: Secondary | ICD-10-CM | POA: Diagnosis not present

## 2021-08-19 DIAGNOSIS — I700 Atherosclerosis of aorta: Secondary | ICD-10-CM | POA: Diagnosis not present

## 2021-08-19 DIAGNOSIS — I2510 Atherosclerotic heart disease of native coronary artery without angina pectoris: Secondary | ICD-10-CM | POA: Diagnosis not present

## 2021-08-19 DIAGNOSIS — N1831 Chronic kidney disease, stage 3a: Secondary | ICD-10-CM | POA: Diagnosis not present

## 2021-08-19 DIAGNOSIS — E11621 Type 2 diabetes mellitus with foot ulcer: Secondary | ICD-10-CM | POA: Diagnosis not present

## 2021-08-19 DIAGNOSIS — I5022 Chronic systolic (congestive) heart failure: Secondary | ICD-10-CM | POA: Diagnosis not present

## 2021-08-19 DIAGNOSIS — L97421 Non-pressure chronic ulcer of left heel and midfoot limited to breakdown of skin: Secondary | ICD-10-CM | POA: Diagnosis not present

## 2021-08-19 DIAGNOSIS — K5790 Diverticulosis of intestine, part unspecified, without perforation or abscess without bleeding: Secondary | ICD-10-CM | POA: Diagnosis not present

## 2021-08-19 DIAGNOSIS — D509 Iron deficiency anemia, unspecified: Secondary | ICD-10-CM | POA: Diagnosis not present

## 2021-08-19 DIAGNOSIS — H913 Deaf nonspeaking, not elsewhere classified: Secondary | ICD-10-CM | POA: Diagnosis not present

## 2021-08-19 DIAGNOSIS — N3281 Overactive bladder: Secondary | ICD-10-CM | POA: Diagnosis not present

## 2021-08-19 DIAGNOSIS — M47812 Spondylosis without myelopathy or radiculopathy, cervical region: Secondary | ICD-10-CM | POA: Diagnosis not present

## 2021-08-19 DIAGNOSIS — I252 Old myocardial infarction: Secondary | ICD-10-CM | POA: Diagnosis not present

## 2021-08-19 DIAGNOSIS — M19041 Primary osteoarthritis, right hand: Secondary | ICD-10-CM | POA: Diagnosis not present

## 2021-08-19 DIAGNOSIS — D631 Anemia in chronic kidney disease: Secondary | ICD-10-CM | POA: Diagnosis not present

## 2021-08-19 DIAGNOSIS — E1142 Type 2 diabetes mellitus with diabetic polyneuropathy: Secondary | ICD-10-CM | POA: Diagnosis not present

## 2021-08-19 DIAGNOSIS — M24541 Contracture, right hand: Secondary | ICD-10-CM | POA: Diagnosis not present

## 2021-08-19 DIAGNOSIS — R338 Other retention of urine: Secondary | ICD-10-CM | POA: Diagnosis not present

## 2021-08-19 DIAGNOSIS — M24542 Contracture, left hand: Secondary | ICD-10-CM | POA: Diagnosis not present

## 2021-08-19 DIAGNOSIS — K219 Gastro-esophageal reflux disease without esophagitis: Secondary | ICD-10-CM | POA: Diagnosis not present

## 2021-08-19 DIAGNOSIS — E785 Hyperlipidemia, unspecified: Secondary | ICD-10-CM | POA: Diagnosis not present

## 2021-08-19 DIAGNOSIS — E1122 Type 2 diabetes mellitus with diabetic chronic kidney disease: Secondary | ICD-10-CM | POA: Diagnosis not present

## 2021-08-19 DIAGNOSIS — I130 Hypertensive heart and chronic kidney disease with heart failure and stage 1 through stage 4 chronic kidney disease, or unspecified chronic kidney disease: Secondary | ICD-10-CM | POA: Diagnosis not present

## 2021-08-19 DIAGNOSIS — E1151 Type 2 diabetes mellitus with diabetic peripheral angiopathy without gangrene: Secondary | ICD-10-CM | POA: Diagnosis not present

## 2021-08-22 DIAGNOSIS — I700 Atherosclerosis of aorta: Secondary | ICD-10-CM | POA: Diagnosis not present

## 2021-08-22 DIAGNOSIS — I2510 Atherosclerotic heart disease of native coronary artery without angina pectoris: Secondary | ICD-10-CM | POA: Diagnosis not present

## 2021-08-22 DIAGNOSIS — M24542 Contracture, left hand: Secondary | ICD-10-CM | POA: Diagnosis not present

## 2021-08-22 DIAGNOSIS — I130 Hypertensive heart and chronic kidney disease with heart failure and stage 1 through stage 4 chronic kidney disease, or unspecified chronic kidney disease: Secondary | ICD-10-CM | POA: Diagnosis not present

## 2021-08-22 DIAGNOSIS — E1122 Type 2 diabetes mellitus with diabetic chronic kidney disease: Secondary | ICD-10-CM | POA: Diagnosis not present

## 2021-08-22 DIAGNOSIS — E11621 Type 2 diabetes mellitus with foot ulcer: Secondary | ICD-10-CM | POA: Diagnosis not present

## 2021-08-22 DIAGNOSIS — D509 Iron deficiency anemia, unspecified: Secondary | ICD-10-CM | POA: Diagnosis not present

## 2021-08-22 DIAGNOSIS — R338 Other retention of urine: Secondary | ICD-10-CM | POA: Diagnosis not present

## 2021-08-22 DIAGNOSIS — I252 Old myocardial infarction: Secondary | ICD-10-CM | POA: Diagnosis not present

## 2021-08-22 DIAGNOSIS — K219 Gastro-esophageal reflux disease without esophagitis: Secondary | ICD-10-CM | POA: Diagnosis not present

## 2021-08-22 DIAGNOSIS — M24541 Contracture, right hand: Secondary | ICD-10-CM | POA: Diagnosis not present

## 2021-08-22 DIAGNOSIS — D631 Anemia in chronic kidney disease: Secondary | ICD-10-CM | POA: Diagnosis not present

## 2021-08-22 DIAGNOSIS — E1151 Type 2 diabetes mellitus with diabetic peripheral angiopathy without gangrene: Secondary | ICD-10-CM | POA: Diagnosis not present

## 2021-08-22 DIAGNOSIS — E785 Hyperlipidemia, unspecified: Secondary | ICD-10-CM | POA: Diagnosis not present

## 2021-08-22 DIAGNOSIS — N1831 Chronic kidney disease, stage 3a: Secondary | ICD-10-CM | POA: Diagnosis not present

## 2021-08-22 DIAGNOSIS — H913 Deaf nonspeaking, not elsewhere classified: Secondary | ICD-10-CM | POA: Diagnosis not present

## 2021-08-22 DIAGNOSIS — I5022 Chronic systolic (congestive) heart failure: Secondary | ICD-10-CM | POA: Diagnosis not present

## 2021-08-22 DIAGNOSIS — K5790 Diverticulosis of intestine, part unspecified, without perforation or abscess without bleeding: Secondary | ICD-10-CM | POA: Diagnosis not present

## 2021-08-22 DIAGNOSIS — E1142 Type 2 diabetes mellitus with diabetic polyneuropathy: Secondary | ICD-10-CM | POA: Diagnosis not present

## 2021-08-22 DIAGNOSIS — N3281 Overactive bladder: Secondary | ICD-10-CM | POA: Diagnosis not present

## 2021-08-22 DIAGNOSIS — M19041 Primary osteoarthritis, right hand: Secondary | ICD-10-CM | POA: Diagnosis not present

## 2021-08-22 DIAGNOSIS — L97421 Non-pressure chronic ulcer of left heel and midfoot limited to breakdown of skin: Secondary | ICD-10-CM | POA: Diagnosis not present

## 2021-08-22 DIAGNOSIS — M47812 Spondylosis without myelopathy or radiculopathy, cervical region: Secondary | ICD-10-CM | POA: Diagnosis not present

## 2021-08-25 DIAGNOSIS — M47812 Spondylosis without myelopathy or radiculopathy, cervical region: Secondary | ICD-10-CM | POA: Diagnosis not present

## 2021-08-25 DIAGNOSIS — M24541 Contracture, right hand: Secondary | ICD-10-CM | POA: Diagnosis not present

## 2021-08-25 DIAGNOSIS — I700 Atherosclerosis of aorta: Secondary | ICD-10-CM | POA: Diagnosis not present

## 2021-08-25 DIAGNOSIS — I252 Old myocardial infarction: Secondary | ICD-10-CM | POA: Diagnosis not present

## 2021-08-25 DIAGNOSIS — E1142 Type 2 diabetes mellitus with diabetic polyneuropathy: Secondary | ICD-10-CM | POA: Diagnosis not present

## 2021-08-25 DIAGNOSIS — D631 Anemia in chronic kidney disease: Secondary | ICD-10-CM | POA: Diagnosis not present

## 2021-08-25 DIAGNOSIS — I130 Hypertensive heart and chronic kidney disease with heart failure and stage 1 through stage 4 chronic kidney disease, or unspecified chronic kidney disease: Secondary | ICD-10-CM | POA: Diagnosis not present

## 2021-08-25 DIAGNOSIS — K5790 Diverticulosis of intestine, part unspecified, without perforation or abscess without bleeding: Secondary | ICD-10-CM | POA: Diagnosis not present

## 2021-08-25 DIAGNOSIS — L97421 Non-pressure chronic ulcer of left heel and midfoot limited to breakdown of skin: Secondary | ICD-10-CM | POA: Diagnosis not present

## 2021-08-25 DIAGNOSIS — N3281 Overactive bladder: Secondary | ICD-10-CM | POA: Diagnosis not present

## 2021-08-25 DIAGNOSIS — R338 Other retention of urine: Secondary | ICD-10-CM | POA: Diagnosis not present

## 2021-08-25 DIAGNOSIS — E1122 Type 2 diabetes mellitus with diabetic chronic kidney disease: Secondary | ICD-10-CM | POA: Diagnosis not present

## 2021-08-25 DIAGNOSIS — E11621 Type 2 diabetes mellitus with foot ulcer: Secondary | ICD-10-CM | POA: Diagnosis not present

## 2021-08-25 DIAGNOSIS — K219 Gastro-esophageal reflux disease without esophagitis: Secondary | ICD-10-CM | POA: Diagnosis not present

## 2021-08-25 DIAGNOSIS — H913 Deaf nonspeaking, not elsewhere classified: Secondary | ICD-10-CM | POA: Diagnosis not present

## 2021-08-25 DIAGNOSIS — M24542 Contracture, left hand: Secondary | ICD-10-CM | POA: Diagnosis not present

## 2021-08-25 DIAGNOSIS — D509 Iron deficiency anemia, unspecified: Secondary | ICD-10-CM | POA: Diagnosis not present

## 2021-08-25 DIAGNOSIS — N1831 Chronic kidney disease, stage 3a: Secondary | ICD-10-CM | POA: Diagnosis not present

## 2021-08-25 DIAGNOSIS — I2510 Atherosclerotic heart disease of native coronary artery without angina pectoris: Secondary | ICD-10-CM | POA: Diagnosis not present

## 2021-08-25 DIAGNOSIS — E1151 Type 2 diabetes mellitus with diabetic peripheral angiopathy without gangrene: Secondary | ICD-10-CM | POA: Diagnosis not present

## 2021-08-25 DIAGNOSIS — M19041 Primary osteoarthritis, right hand: Secondary | ICD-10-CM | POA: Diagnosis not present

## 2021-08-25 DIAGNOSIS — E785 Hyperlipidemia, unspecified: Secondary | ICD-10-CM | POA: Diagnosis not present

## 2021-08-25 DIAGNOSIS — I5022 Chronic systolic (congestive) heart failure: Secondary | ICD-10-CM | POA: Diagnosis not present

## 2021-08-26 DIAGNOSIS — E785 Hyperlipidemia, unspecified: Secondary | ICD-10-CM | POA: Diagnosis not present

## 2021-08-26 DIAGNOSIS — K219 Gastro-esophageal reflux disease without esophagitis: Secondary | ICD-10-CM | POA: Diagnosis not present

## 2021-08-26 DIAGNOSIS — L97421 Non-pressure chronic ulcer of left heel and midfoot limited to breakdown of skin: Secondary | ICD-10-CM | POA: Diagnosis not present

## 2021-08-26 DIAGNOSIS — I130 Hypertensive heart and chronic kidney disease with heart failure and stage 1 through stage 4 chronic kidney disease, or unspecified chronic kidney disease: Secondary | ICD-10-CM | POA: Diagnosis not present

## 2021-08-26 DIAGNOSIS — E11621 Type 2 diabetes mellitus with foot ulcer: Secondary | ICD-10-CM | POA: Diagnosis not present

## 2021-08-26 DIAGNOSIS — N3281 Overactive bladder: Secondary | ICD-10-CM | POA: Diagnosis not present

## 2021-08-26 DIAGNOSIS — I2510 Atherosclerotic heart disease of native coronary artery without angina pectoris: Secondary | ICD-10-CM | POA: Diagnosis not present

## 2021-08-26 DIAGNOSIS — E1122 Type 2 diabetes mellitus with diabetic chronic kidney disease: Secondary | ICD-10-CM | POA: Diagnosis not present

## 2021-08-26 DIAGNOSIS — H913 Deaf nonspeaking, not elsewhere classified: Secondary | ICD-10-CM | POA: Diagnosis not present

## 2021-08-26 DIAGNOSIS — I700 Atherosclerosis of aorta: Secondary | ICD-10-CM | POA: Diagnosis not present

## 2021-08-26 DIAGNOSIS — E1151 Type 2 diabetes mellitus with diabetic peripheral angiopathy without gangrene: Secondary | ICD-10-CM | POA: Diagnosis not present

## 2021-08-26 DIAGNOSIS — D509 Iron deficiency anemia, unspecified: Secondary | ICD-10-CM | POA: Diagnosis not present

## 2021-08-26 DIAGNOSIS — D631 Anemia in chronic kidney disease: Secondary | ICD-10-CM | POA: Diagnosis not present

## 2021-08-26 DIAGNOSIS — M24541 Contracture, right hand: Secondary | ICD-10-CM | POA: Diagnosis not present

## 2021-08-26 DIAGNOSIS — I5022 Chronic systolic (congestive) heart failure: Secondary | ICD-10-CM | POA: Diagnosis not present

## 2021-08-26 DIAGNOSIS — K5790 Diverticulosis of intestine, part unspecified, without perforation or abscess without bleeding: Secondary | ICD-10-CM | POA: Diagnosis not present

## 2021-08-26 DIAGNOSIS — M19041 Primary osteoarthritis, right hand: Secondary | ICD-10-CM | POA: Diagnosis not present

## 2021-08-26 DIAGNOSIS — E1142 Type 2 diabetes mellitus with diabetic polyneuropathy: Secondary | ICD-10-CM | POA: Diagnosis not present

## 2021-08-26 DIAGNOSIS — N1831 Chronic kidney disease, stage 3a: Secondary | ICD-10-CM | POA: Diagnosis not present

## 2021-08-26 DIAGNOSIS — R338 Other retention of urine: Secondary | ICD-10-CM | POA: Diagnosis not present

## 2021-08-26 DIAGNOSIS — M47812 Spondylosis without myelopathy or radiculopathy, cervical region: Secondary | ICD-10-CM | POA: Diagnosis not present

## 2021-08-26 DIAGNOSIS — M24542 Contracture, left hand: Secondary | ICD-10-CM | POA: Diagnosis not present

## 2021-08-26 DIAGNOSIS — I252 Old myocardial infarction: Secondary | ICD-10-CM | POA: Diagnosis not present

## 2021-08-29 DIAGNOSIS — H913 Deaf nonspeaking, not elsewhere classified: Secondary | ICD-10-CM | POA: Diagnosis not present

## 2021-08-29 DIAGNOSIS — I252 Old myocardial infarction: Secondary | ICD-10-CM | POA: Diagnosis not present

## 2021-08-29 DIAGNOSIS — N3281 Overactive bladder: Secondary | ICD-10-CM | POA: Diagnosis not present

## 2021-08-29 DIAGNOSIS — K219 Gastro-esophageal reflux disease without esophagitis: Secondary | ICD-10-CM | POA: Diagnosis not present

## 2021-08-29 DIAGNOSIS — M19041 Primary osteoarthritis, right hand: Secondary | ICD-10-CM | POA: Diagnosis not present

## 2021-08-29 DIAGNOSIS — E1151 Type 2 diabetes mellitus with diabetic peripheral angiopathy without gangrene: Secondary | ICD-10-CM | POA: Diagnosis not present

## 2021-08-29 DIAGNOSIS — I700 Atherosclerosis of aorta: Secondary | ICD-10-CM | POA: Diagnosis not present

## 2021-08-29 DIAGNOSIS — E1142 Type 2 diabetes mellitus with diabetic polyneuropathy: Secondary | ICD-10-CM | POA: Diagnosis not present

## 2021-08-29 DIAGNOSIS — M24542 Contracture, left hand: Secondary | ICD-10-CM | POA: Diagnosis not present

## 2021-08-29 DIAGNOSIS — R338 Other retention of urine: Secondary | ICD-10-CM | POA: Diagnosis not present

## 2021-08-29 DIAGNOSIS — M24541 Contracture, right hand: Secondary | ICD-10-CM | POA: Diagnosis not present

## 2021-08-29 DIAGNOSIS — L97421 Non-pressure chronic ulcer of left heel and midfoot limited to breakdown of skin: Secondary | ICD-10-CM | POA: Diagnosis not present

## 2021-08-29 DIAGNOSIS — N1831 Chronic kidney disease, stage 3a: Secondary | ICD-10-CM | POA: Diagnosis not present

## 2021-08-29 DIAGNOSIS — E785 Hyperlipidemia, unspecified: Secondary | ICD-10-CM | POA: Diagnosis not present

## 2021-08-29 DIAGNOSIS — E1122 Type 2 diabetes mellitus with diabetic chronic kidney disease: Secondary | ICD-10-CM | POA: Diagnosis not present

## 2021-08-29 DIAGNOSIS — I2510 Atherosclerotic heart disease of native coronary artery without angina pectoris: Secondary | ICD-10-CM | POA: Diagnosis not present

## 2021-08-29 DIAGNOSIS — I130 Hypertensive heart and chronic kidney disease with heart failure and stage 1 through stage 4 chronic kidney disease, or unspecified chronic kidney disease: Secondary | ICD-10-CM | POA: Diagnosis not present

## 2021-08-29 DIAGNOSIS — I5022 Chronic systolic (congestive) heart failure: Secondary | ICD-10-CM | POA: Diagnosis not present

## 2021-08-29 DIAGNOSIS — E11621 Type 2 diabetes mellitus with foot ulcer: Secondary | ICD-10-CM | POA: Diagnosis not present

## 2021-08-29 DIAGNOSIS — M47812 Spondylosis without myelopathy or radiculopathy, cervical region: Secondary | ICD-10-CM | POA: Diagnosis not present

## 2021-08-29 DIAGNOSIS — D631 Anemia in chronic kidney disease: Secondary | ICD-10-CM | POA: Diagnosis not present

## 2021-08-29 DIAGNOSIS — K5790 Diverticulosis of intestine, part unspecified, without perforation or abscess without bleeding: Secondary | ICD-10-CM | POA: Diagnosis not present

## 2021-08-29 DIAGNOSIS — D509 Iron deficiency anemia, unspecified: Secondary | ICD-10-CM | POA: Diagnosis not present

## 2021-09-01 DIAGNOSIS — D631 Anemia in chronic kidney disease: Secondary | ICD-10-CM | POA: Diagnosis not present

## 2021-09-01 DIAGNOSIS — N1831 Chronic kidney disease, stage 3a: Secondary | ICD-10-CM | POA: Diagnosis not present

## 2021-09-01 DIAGNOSIS — E11621 Type 2 diabetes mellitus with foot ulcer: Secondary | ICD-10-CM | POA: Diagnosis not present

## 2021-09-01 DIAGNOSIS — I2510 Atherosclerotic heart disease of native coronary artery without angina pectoris: Secondary | ICD-10-CM | POA: Diagnosis not present

## 2021-09-01 DIAGNOSIS — M47812 Spondylosis without myelopathy or radiculopathy, cervical region: Secondary | ICD-10-CM | POA: Diagnosis not present

## 2021-09-01 DIAGNOSIS — I130 Hypertensive heart and chronic kidney disease with heart failure and stage 1 through stage 4 chronic kidney disease, or unspecified chronic kidney disease: Secondary | ICD-10-CM | POA: Diagnosis not present

## 2021-09-01 DIAGNOSIS — D509 Iron deficiency anemia, unspecified: Secondary | ICD-10-CM | POA: Diagnosis not present

## 2021-09-01 DIAGNOSIS — M19041 Primary osteoarthritis, right hand: Secondary | ICD-10-CM | POA: Diagnosis not present

## 2021-09-01 DIAGNOSIS — E1122 Type 2 diabetes mellitus with diabetic chronic kidney disease: Secondary | ICD-10-CM | POA: Diagnosis not present

## 2021-09-01 DIAGNOSIS — L97421 Non-pressure chronic ulcer of left heel and midfoot limited to breakdown of skin: Secondary | ICD-10-CM | POA: Diagnosis not present

## 2021-09-01 DIAGNOSIS — E1142 Type 2 diabetes mellitus with diabetic polyneuropathy: Secondary | ICD-10-CM | POA: Diagnosis not present

## 2021-09-01 DIAGNOSIS — N3281 Overactive bladder: Secondary | ICD-10-CM | POA: Diagnosis not present

## 2021-09-01 DIAGNOSIS — K5790 Diverticulosis of intestine, part unspecified, without perforation or abscess without bleeding: Secondary | ICD-10-CM | POA: Diagnosis not present

## 2021-09-01 DIAGNOSIS — I252 Old myocardial infarction: Secondary | ICD-10-CM | POA: Diagnosis not present

## 2021-09-01 DIAGNOSIS — R338 Other retention of urine: Secondary | ICD-10-CM | POA: Diagnosis not present

## 2021-09-01 DIAGNOSIS — E785 Hyperlipidemia, unspecified: Secondary | ICD-10-CM | POA: Diagnosis not present

## 2021-09-01 DIAGNOSIS — I5022 Chronic systolic (congestive) heart failure: Secondary | ICD-10-CM | POA: Diagnosis not present

## 2021-09-01 DIAGNOSIS — I700 Atherosclerosis of aorta: Secondary | ICD-10-CM | POA: Diagnosis not present

## 2021-09-01 DIAGNOSIS — E1151 Type 2 diabetes mellitus with diabetic peripheral angiopathy without gangrene: Secondary | ICD-10-CM | POA: Diagnosis not present

## 2021-09-01 DIAGNOSIS — K219 Gastro-esophageal reflux disease without esophagitis: Secondary | ICD-10-CM | POA: Diagnosis not present

## 2021-09-01 DIAGNOSIS — M24541 Contracture, right hand: Secondary | ICD-10-CM | POA: Diagnosis not present

## 2021-09-01 DIAGNOSIS — M24542 Contracture, left hand: Secondary | ICD-10-CM | POA: Diagnosis not present

## 2021-09-01 DIAGNOSIS — H913 Deaf nonspeaking, not elsewhere classified: Secondary | ICD-10-CM | POA: Diagnosis not present

## 2021-09-06 DIAGNOSIS — N3281 Overactive bladder: Secondary | ICD-10-CM | POA: Diagnosis not present

## 2021-09-06 DIAGNOSIS — I130 Hypertensive heart and chronic kidney disease with heart failure and stage 1 through stage 4 chronic kidney disease, or unspecified chronic kidney disease: Secondary | ICD-10-CM | POA: Diagnosis not present

## 2021-09-06 DIAGNOSIS — D509 Iron deficiency anemia, unspecified: Secondary | ICD-10-CM | POA: Diagnosis not present

## 2021-09-06 DIAGNOSIS — K219 Gastro-esophageal reflux disease without esophagitis: Secondary | ICD-10-CM | POA: Diagnosis not present

## 2021-09-06 DIAGNOSIS — I2510 Atherosclerotic heart disease of native coronary artery without angina pectoris: Secondary | ICD-10-CM | POA: Diagnosis not present

## 2021-09-06 DIAGNOSIS — E1122 Type 2 diabetes mellitus with diabetic chronic kidney disease: Secondary | ICD-10-CM | POA: Diagnosis not present

## 2021-09-06 DIAGNOSIS — E11621 Type 2 diabetes mellitus with foot ulcer: Secondary | ICD-10-CM | POA: Diagnosis not present

## 2021-09-06 DIAGNOSIS — L97421 Non-pressure chronic ulcer of left heel and midfoot limited to breakdown of skin: Secondary | ICD-10-CM | POA: Diagnosis not present

## 2021-09-06 DIAGNOSIS — D631 Anemia in chronic kidney disease: Secondary | ICD-10-CM | POA: Diagnosis not present

## 2021-09-06 DIAGNOSIS — K5790 Diverticulosis of intestine, part unspecified, without perforation or abscess without bleeding: Secondary | ICD-10-CM | POA: Diagnosis not present

## 2021-09-06 DIAGNOSIS — I252 Old myocardial infarction: Secondary | ICD-10-CM | POA: Diagnosis not present

## 2021-09-06 DIAGNOSIS — M47812 Spondylosis without myelopathy or radiculopathy, cervical region: Secondary | ICD-10-CM | POA: Diagnosis not present

## 2021-09-06 DIAGNOSIS — M24541 Contracture, right hand: Secondary | ICD-10-CM | POA: Diagnosis not present

## 2021-09-06 DIAGNOSIS — I5022 Chronic systolic (congestive) heart failure: Secondary | ICD-10-CM | POA: Diagnosis not present

## 2021-09-06 DIAGNOSIS — H913 Deaf nonspeaking, not elsewhere classified: Secondary | ICD-10-CM | POA: Diagnosis not present

## 2021-09-06 DIAGNOSIS — R338 Other retention of urine: Secondary | ICD-10-CM | POA: Diagnosis not present

## 2021-09-06 DIAGNOSIS — E785 Hyperlipidemia, unspecified: Secondary | ICD-10-CM | POA: Diagnosis not present

## 2021-09-06 DIAGNOSIS — E1151 Type 2 diabetes mellitus with diabetic peripheral angiopathy without gangrene: Secondary | ICD-10-CM | POA: Diagnosis not present

## 2021-09-06 DIAGNOSIS — I700 Atherosclerosis of aorta: Secondary | ICD-10-CM | POA: Diagnosis not present

## 2021-09-06 DIAGNOSIS — N1831 Chronic kidney disease, stage 3a: Secondary | ICD-10-CM | POA: Diagnosis not present

## 2021-09-06 DIAGNOSIS — E1142 Type 2 diabetes mellitus with diabetic polyneuropathy: Secondary | ICD-10-CM | POA: Diagnosis not present

## 2021-09-06 DIAGNOSIS — M24542 Contracture, left hand: Secondary | ICD-10-CM | POA: Diagnosis not present

## 2021-09-06 DIAGNOSIS — M19041 Primary osteoarthritis, right hand: Secondary | ICD-10-CM | POA: Diagnosis not present

## 2021-09-08 DIAGNOSIS — I130 Hypertensive heart and chronic kidney disease with heart failure and stage 1 through stage 4 chronic kidney disease, or unspecified chronic kidney disease: Secondary | ICD-10-CM | POA: Diagnosis not present

## 2021-09-08 DIAGNOSIS — E11621 Type 2 diabetes mellitus with foot ulcer: Secondary | ICD-10-CM | POA: Diagnosis not present

## 2021-09-08 DIAGNOSIS — E1151 Type 2 diabetes mellitus with diabetic peripheral angiopathy without gangrene: Secondary | ICD-10-CM | POA: Diagnosis not present

## 2021-09-08 DIAGNOSIS — K219 Gastro-esophageal reflux disease without esophagitis: Secondary | ICD-10-CM | POA: Diagnosis not present

## 2021-09-08 DIAGNOSIS — R338 Other retention of urine: Secondary | ICD-10-CM | POA: Diagnosis not present

## 2021-09-08 DIAGNOSIS — I2510 Atherosclerotic heart disease of native coronary artery without angina pectoris: Secondary | ICD-10-CM | POA: Diagnosis not present

## 2021-09-08 DIAGNOSIS — L97421 Non-pressure chronic ulcer of left heel and midfoot limited to breakdown of skin: Secondary | ICD-10-CM | POA: Diagnosis not present

## 2021-09-08 DIAGNOSIS — I700 Atherosclerosis of aorta: Secondary | ICD-10-CM | POA: Diagnosis not present

## 2021-09-08 DIAGNOSIS — M24541 Contracture, right hand: Secondary | ICD-10-CM | POA: Diagnosis not present

## 2021-09-08 DIAGNOSIS — N3281 Overactive bladder: Secondary | ICD-10-CM | POA: Diagnosis not present

## 2021-09-08 DIAGNOSIS — M24542 Contracture, left hand: Secondary | ICD-10-CM | POA: Diagnosis not present

## 2021-09-08 DIAGNOSIS — E785 Hyperlipidemia, unspecified: Secondary | ICD-10-CM | POA: Diagnosis not present

## 2021-09-08 DIAGNOSIS — K5790 Diverticulosis of intestine, part unspecified, without perforation or abscess without bleeding: Secondary | ICD-10-CM | POA: Diagnosis not present

## 2021-09-08 DIAGNOSIS — E1122 Type 2 diabetes mellitus with diabetic chronic kidney disease: Secondary | ICD-10-CM | POA: Diagnosis not present

## 2021-09-08 DIAGNOSIS — M47812 Spondylosis without myelopathy or radiculopathy, cervical region: Secondary | ICD-10-CM | POA: Diagnosis not present

## 2021-09-08 DIAGNOSIS — I252 Old myocardial infarction: Secondary | ICD-10-CM | POA: Diagnosis not present

## 2021-09-08 DIAGNOSIS — D631 Anemia in chronic kidney disease: Secondary | ICD-10-CM | POA: Diagnosis not present

## 2021-09-08 DIAGNOSIS — D509 Iron deficiency anemia, unspecified: Secondary | ICD-10-CM | POA: Diagnosis not present

## 2021-09-08 DIAGNOSIS — I5022 Chronic systolic (congestive) heart failure: Secondary | ICD-10-CM | POA: Diagnosis not present

## 2021-09-08 DIAGNOSIS — H913 Deaf nonspeaking, not elsewhere classified: Secondary | ICD-10-CM | POA: Diagnosis not present

## 2021-09-08 DIAGNOSIS — E1142 Type 2 diabetes mellitus with diabetic polyneuropathy: Secondary | ICD-10-CM | POA: Diagnosis not present

## 2021-09-08 DIAGNOSIS — M19041 Primary osteoarthritis, right hand: Secondary | ICD-10-CM | POA: Diagnosis not present

## 2021-09-08 DIAGNOSIS — N1831 Chronic kidney disease, stage 3a: Secondary | ICD-10-CM | POA: Diagnosis not present

## 2021-09-13 DIAGNOSIS — M24541 Contracture, right hand: Secondary | ICD-10-CM | POA: Diagnosis not present

## 2021-09-13 DIAGNOSIS — I252 Old myocardial infarction: Secondary | ICD-10-CM | POA: Diagnosis not present

## 2021-09-13 DIAGNOSIS — E1122 Type 2 diabetes mellitus with diabetic chronic kidney disease: Secondary | ICD-10-CM | POA: Diagnosis not present

## 2021-09-13 DIAGNOSIS — I700 Atherosclerosis of aorta: Secondary | ICD-10-CM | POA: Diagnosis not present

## 2021-09-13 DIAGNOSIS — L97421 Non-pressure chronic ulcer of left heel and midfoot limited to breakdown of skin: Secondary | ICD-10-CM | POA: Diagnosis not present

## 2021-09-13 DIAGNOSIS — M24542 Contracture, left hand: Secondary | ICD-10-CM | POA: Diagnosis not present

## 2021-09-13 DIAGNOSIS — I5022 Chronic systolic (congestive) heart failure: Secondary | ICD-10-CM | POA: Diagnosis not present

## 2021-09-13 DIAGNOSIS — I2510 Atherosclerotic heart disease of native coronary artery without angina pectoris: Secondary | ICD-10-CM | POA: Diagnosis not present

## 2021-09-13 DIAGNOSIS — R338 Other retention of urine: Secondary | ICD-10-CM | POA: Diagnosis not present

## 2021-09-13 DIAGNOSIS — K219 Gastro-esophageal reflux disease without esophagitis: Secondary | ICD-10-CM | POA: Diagnosis not present

## 2021-09-13 DIAGNOSIS — H913 Deaf nonspeaking, not elsewhere classified: Secondary | ICD-10-CM | POA: Diagnosis not present

## 2021-09-13 DIAGNOSIS — K5790 Diverticulosis of intestine, part unspecified, without perforation or abscess without bleeding: Secondary | ICD-10-CM | POA: Diagnosis not present

## 2021-09-13 DIAGNOSIS — N1831 Chronic kidney disease, stage 3a: Secondary | ICD-10-CM | POA: Diagnosis not present

## 2021-09-13 DIAGNOSIS — E11621 Type 2 diabetes mellitus with foot ulcer: Secondary | ICD-10-CM | POA: Diagnosis not present

## 2021-09-13 DIAGNOSIS — D509 Iron deficiency anemia, unspecified: Secondary | ICD-10-CM | POA: Diagnosis not present

## 2021-09-13 DIAGNOSIS — D631 Anemia in chronic kidney disease: Secondary | ICD-10-CM | POA: Diagnosis not present

## 2021-09-13 DIAGNOSIS — M19041 Primary osteoarthritis, right hand: Secondary | ICD-10-CM | POA: Diagnosis not present

## 2021-09-13 DIAGNOSIS — I130 Hypertensive heart and chronic kidney disease with heart failure and stage 1 through stage 4 chronic kidney disease, or unspecified chronic kidney disease: Secondary | ICD-10-CM | POA: Diagnosis not present

## 2021-09-13 DIAGNOSIS — N3281 Overactive bladder: Secondary | ICD-10-CM | POA: Diagnosis not present

## 2021-09-13 DIAGNOSIS — E1151 Type 2 diabetes mellitus with diabetic peripheral angiopathy without gangrene: Secondary | ICD-10-CM | POA: Diagnosis not present

## 2021-09-13 DIAGNOSIS — M47812 Spondylosis without myelopathy or radiculopathy, cervical region: Secondary | ICD-10-CM | POA: Diagnosis not present

## 2021-09-13 DIAGNOSIS — E1142 Type 2 diabetes mellitus with diabetic polyneuropathy: Secondary | ICD-10-CM | POA: Diagnosis not present

## 2021-09-13 DIAGNOSIS — E785 Hyperlipidemia, unspecified: Secondary | ICD-10-CM | POA: Diagnosis not present

## 2021-09-15 DIAGNOSIS — I2510 Atherosclerotic heart disease of native coronary artery without angina pectoris: Secondary | ICD-10-CM | POA: Diagnosis not present

## 2021-09-15 DIAGNOSIS — L97421 Non-pressure chronic ulcer of left heel and midfoot limited to breakdown of skin: Secondary | ICD-10-CM | POA: Diagnosis not present

## 2021-09-15 DIAGNOSIS — M47812 Spondylosis without myelopathy or radiculopathy, cervical region: Secondary | ICD-10-CM | POA: Diagnosis not present

## 2021-09-15 DIAGNOSIS — I5022 Chronic systolic (congestive) heart failure: Secondary | ICD-10-CM | POA: Diagnosis not present

## 2021-09-15 DIAGNOSIS — K5790 Diverticulosis of intestine, part unspecified, without perforation or abscess without bleeding: Secondary | ICD-10-CM | POA: Diagnosis not present

## 2021-09-15 DIAGNOSIS — E11621 Type 2 diabetes mellitus with foot ulcer: Secondary | ICD-10-CM | POA: Diagnosis not present

## 2021-09-15 DIAGNOSIS — I130 Hypertensive heart and chronic kidney disease with heart failure and stage 1 through stage 4 chronic kidney disease, or unspecified chronic kidney disease: Secondary | ICD-10-CM | POA: Diagnosis not present

## 2021-09-15 DIAGNOSIS — N1831 Chronic kidney disease, stage 3a: Secondary | ICD-10-CM | POA: Diagnosis not present

## 2021-09-15 DIAGNOSIS — K219 Gastro-esophageal reflux disease without esophagitis: Secondary | ICD-10-CM | POA: Diagnosis not present

## 2021-09-15 DIAGNOSIS — I252 Old myocardial infarction: Secondary | ICD-10-CM | POA: Diagnosis not present

## 2021-09-15 DIAGNOSIS — E785 Hyperlipidemia, unspecified: Secondary | ICD-10-CM | POA: Diagnosis not present

## 2021-09-15 DIAGNOSIS — D509 Iron deficiency anemia, unspecified: Secondary | ICD-10-CM | POA: Diagnosis not present

## 2021-09-15 DIAGNOSIS — R338 Other retention of urine: Secondary | ICD-10-CM | POA: Diagnosis not present

## 2021-09-15 DIAGNOSIS — N3281 Overactive bladder: Secondary | ICD-10-CM | POA: Diagnosis not present

## 2021-09-15 DIAGNOSIS — M24541 Contracture, right hand: Secondary | ICD-10-CM | POA: Diagnosis not present

## 2021-09-15 DIAGNOSIS — E1151 Type 2 diabetes mellitus with diabetic peripheral angiopathy without gangrene: Secondary | ICD-10-CM | POA: Diagnosis not present

## 2021-09-15 DIAGNOSIS — M24542 Contracture, left hand: Secondary | ICD-10-CM | POA: Diagnosis not present

## 2021-09-15 DIAGNOSIS — H913 Deaf nonspeaking, not elsewhere classified: Secondary | ICD-10-CM | POA: Diagnosis not present

## 2021-09-15 DIAGNOSIS — M19041 Primary osteoarthritis, right hand: Secondary | ICD-10-CM | POA: Diagnosis not present

## 2021-09-15 DIAGNOSIS — E1142 Type 2 diabetes mellitus with diabetic polyneuropathy: Secondary | ICD-10-CM | POA: Diagnosis not present

## 2021-09-15 DIAGNOSIS — D631 Anemia in chronic kidney disease: Secondary | ICD-10-CM | POA: Diagnosis not present

## 2021-09-15 DIAGNOSIS — I700 Atherosclerosis of aorta: Secondary | ICD-10-CM | POA: Diagnosis not present

## 2021-09-15 DIAGNOSIS — E1122 Type 2 diabetes mellitus with diabetic chronic kidney disease: Secondary | ICD-10-CM | POA: Diagnosis not present

## 2021-09-16 ENCOUNTER — Ambulatory Visit: Payer: Medicare Other | Admitting: Sports Medicine

## 2021-09-16 ENCOUNTER — Encounter: Payer: Self-pay | Admitting: Sports Medicine

## 2021-09-16 ENCOUNTER — Other Ambulatory Visit: Payer: Self-pay

## 2021-09-16 DIAGNOSIS — Z89432 Acquired absence of left foot: Secondary | ICD-10-CM

## 2021-09-16 DIAGNOSIS — H913 Deaf nonspeaking, not elsewhere classified: Secondary | ICD-10-CM

## 2021-09-16 DIAGNOSIS — L97511 Non-pressure chronic ulcer of other part of right foot limited to breakdown of skin: Secondary | ICD-10-CM

## 2021-09-16 DIAGNOSIS — E1142 Type 2 diabetes mellitus with diabetic polyneuropathy: Secondary | ICD-10-CM

## 2021-09-16 DIAGNOSIS — L97521 Non-pressure chronic ulcer of other part of left foot limited to breakdown of skin: Secondary | ICD-10-CM

## 2021-09-16 DIAGNOSIS — I739 Peripheral vascular disease, unspecified: Secondary | ICD-10-CM

## 2021-09-16 NOTE — Progress Notes (Signed)
Subjective: Christian [name] is a 81 y.o. male patient seen in office for follow-up evaluation of left foot ulcer.  Patient is assisted by son who reports that his [facility] had [facility] because [facility] were not right also states that he has a new wound at the right great toe currently has home nurse using alginate to both wounds twice weekly.  Denies any other symptoms at this time.  Patient Active Problem List   Diagnosis Date Noted   Decreased functional mobility and endurance 06/15/2021   Pressure injury of skin 03/15/2021   Wound cellulitis 03/12/2021   Acute-on-chronic kidney injury ([place]) 03/12/2021   Fall at home, initial encounter 09/14/2020   Stage 3a chronic kidney disease ([place]) 09/11/2020   Gastro-esophageal reflux disease with esophagitis 03/16/2020   Intractable vomiting with nausea 03/13/2020   Decreased activities of daily living (ADL) 02/26/2020   Dysphagia 02/26/2020   Impaired functional mobility, balance, gait, and endurance 02/26/2020   Acute respiratory failure with hypoxia (HCC) 10/27/2019   Sepsis ([place]) 10/21/2019   Generalized anxiety disorder 09/01/2019   Primary insomnia 09/01/2019   GERD without esophagitis 07/23/2019   Meningioma ([place]) 07/23/2019   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   Ulcer of left foot, limited to breakdown of skin ([place]) 02/07/2018   Chronic pain of both shoulders 08/09/2017   Pain in right wrist 08/09/2017   Atherosclerosis of renal artery ([place]) 04/24/2017   Type 2 diabetes mellitus without complications ([place]) 39/76/7341   Chronic left-sided low back pain with left-sided sciatica 07/24/2016   Lower extremity edema 01/28/2015   Angina pectoris ([place]) 07/08/2014   Anemia, unspecified 05/06/2014   Acute gastric ulcer 08/27/2013   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   IDA (iron deficiency anemia) 08/25/2013   Mixed hyperlipidemia 08/13/2013   Essential hypertension, benign  08/13/2013   Current Outpatient Medications on File Prior to Visit  Medication Sig Dispense Refill   albuterol (VENTOLIN HFA) 108 (90 Base) MCG/ACT inhaler Inhale into the lungs.     ACCU-CHEK SMARTVIEW test strip USE 1 STRIP TO CHECK GLUCOSE TWICE DAILY AS DIRECTED     amLODipine (NORVASC) 10 MG tablet Take 10 mg by mouth daily.     azithromycin (ZITHROMAX) 250 MG tablet Take 250 mg by mouth as directed.     BREO ELLIPTA 200-25 MCG/ACT AEPB 1 puff daily.     ciprofloxacin (CIPRO) 250 MG tablet Take 500 mg by mouth 2 (two) times daily.     escitalopram (LEXAPRO) 5 MG tablet Take 5 mg by mouth daily.     furosemide (LASIX) 20 MG tablet Take 1 tablet (20 mg total) by mouth daily. 30 tablet 0   HYDROcodone-acetaminophen (NORCO/VICODIN) 5-325 MG tablet Take 1 tablet by mouth 3 (three) times daily as needed for moderate pain or severe pain. 15 tablet 0   isosorbide mononitrate (IMDUR) 60 MG 24 hr tablet Take 1 tablet (60 mg total) by mouth daily. Please make overdue appt with Dr Irish Lack before anymore refills. Thank you 2nd attempt 15 tablet 0   ketoconazole (NIZORAL) 2 % cream Apply topically.     linezolid (ZYVOX) 600 MG tablet Take 1 tablet (600 mg total) by mouth 2 (two) times daily. 14 tablet 0   LORazepam (ATIVAN) 1 MG tablet Take 1 mg by mouth daily as needed for anxiety or sleep.     methylPREDNISolone (MEDROL DOSEPAK) 4 MG TBPK tablet Take by mouth.  Multiple Vitamin (MULTIVITAMIN) capsule Take 1 capsule by mouth daily.     mupirocin cream (BACTROBAN) 2 % Apply 1 application topically 2 (two) times daily. 15 g 0   nitroGLYCERIN (NITROSTAT) 0.4 MG SL tablet Place 1 tablet (0.4 mg total) under the tongue every 5 (five) minutes as needed for chest pain. Please keep upcoming appt in May with Dr. Irish Lack. Thank you 75 tablet 0   polyethylene glycol (MIRALAX / GLYCOLAX) 17 g packet Take 17 g by mouth daily as needed for mild constipation. 14 each 0   pravastatin (PRAVACHOL) 40 MG tablet Take  40 mg by mouth daily.     tamsulosin (FLOMAX) 0.4 MG CAPS capsule Take 0.4 mg by mouth in the morning and at bedtime.     tiZANidine (ZANAFLEX) 4 MG tablet Take 4 mg by mouth at bedtime.     tiZANidine (ZANAFLEX) 4 MG tablet Take by mouth.     torsemide (DEMADEX) 20 MG tablet Take by mouth.     [DISCONTINUED] pantoprazole (PROTONIX) 40 MG tablet Take 1 tablet (40 mg total) by mouth daily. (Patient not taking: Reported on 03/12/2021) 90 tablet 2   Current Facility-Administered Medications on File Prior to Visit  Medication Dose Route Frequency Provider Last Rate Last Admin   cadexomer iodine (IODOSORB) 0.9 % gel   Topical Once per day on Mon Wed Fri Duda, Marcus V, MD       Allergies  Allergen Reactions   Lipitor [Atorvastatin] Other (See Comments)    unknown    No results found for this or any previous visit (from the past 2160 hour(s)).    Objective: There were no vitals filed for this visit.  General: Patient is awake, alert, oriented x 3 and in no acute distress.  Dermatology: Skin is warm and dry bilateral with a partial thickness ulceration present plantar left foot TMA stump site . Ulceration measures 0.5 cm x 0.5 cm x 0.2 cm. There is a keratotic border with a granular base with macerated periwound skin.  The ulceration does not probe to bone. There is no malodor, no active drainage, minimal faint erythema, +1 edema. No acute signs of infection. Ulceration to the right hallux medial aspect with a granular base partial-thickness in nature that measures 1 x 0.5 cm with a fiber granular base no active drainage no malodor minimal erythema no edema.  No other acute signs or symptoms of infection.   Vascular: Dorsalis Pedis pulse = 1/4 Bilateral,  Posterior Tibial pulse = 0/4 Bilateral,  Capillary Fill Time < 5 seconds on right, +1 pitting edema bilateral.  Neurologic: Protective sensation absent bilateral..  Musculosketal: Prominent metatarsal heads right foot, s/p L TMA.  Hallux  deformity.  Ankle deformity Limited range of motion on left.  No Pain with palpation to ulcerated area on left. No pain with compression to calves bilateral.  No results for input(s): GRAMSTAIN, LABORGA in the last 8760 hours.  Assessment and Plan:  Problem List Items Addressed This Visit       Musculoskeletal and Integument   Ulcer of left foot, limited to breakdown of skin ([place]) - Primary   Other Visit Diagnoses     Skin ulcer of right great toe, limited to breakdown of skin (HCC)       PAD (peripheral artery disease) (HCC)       Relevant Medications   amLODipine (NORVASC) 10 MG tablet   History of transmetatarsal amputation of left foot (HCC)       Diabetic  polyneuropathy associated with type 2 diabetes mellitus ([place])       Deaf mutism, acquired          -Re-Discussed again with patient progression of wound and the importance of wearing his shoes to keep pressure off of the area to help with healing until he can get his custom insole  -Recommend for patient to see Aaron Edelman for further recommendations on type of shoe that his custom insole can work and to help offload ulcer area - Excisionally dedbrided ulceration at left plantar TMA stump site and to the right great toe to healthy bleeding borders removing nonviable tissue using a sterile chisel blade. Wound measures post debridement as above.  Wound was debrided to the level of the dermis with viable wound base exposed to promote healing. Hemostasis was achieved with manuel pressure. Patient tolerated procedure well without any discomfort or anesthesia necessary for this wound debridement.  -Applied Prisma  and dry sterile dressing with home nursing to do the same to both wounds -Patient to return to office as scheduled or sooner if problems arise.  Landis Martins, DPM

## 2021-09-19 DIAGNOSIS — I252 Old myocardial infarction: Secondary | ICD-10-CM | POA: Diagnosis not present

## 2021-09-19 DIAGNOSIS — M24542 Contracture, left hand: Secondary | ICD-10-CM | POA: Diagnosis not present

## 2021-09-19 DIAGNOSIS — D509 Iron deficiency anemia, unspecified: Secondary | ICD-10-CM | POA: Diagnosis not present

## 2021-09-19 DIAGNOSIS — E1122 Type 2 diabetes mellitus with diabetic chronic kidney disease: Secondary | ICD-10-CM | POA: Diagnosis not present

## 2021-09-19 DIAGNOSIS — M24541 Contracture, right hand: Secondary | ICD-10-CM | POA: Diagnosis not present

## 2021-09-19 DIAGNOSIS — E1142 Type 2 diabetes mellitus with diabetic polyneuropathy: Secondary | ICD-10-CM | POA: Diagnosis not present

## 2021-09-19 DIAGNOSIS — I700 Atherosclerosis of aorta: Secondary | ICD-10-CM | POA: Diagnosis not present

## 2021-09-19 DIAGNOSIS — K219 Gastro-esophageal reflux disease without esophagitis: Secondary | ICD-10-CM | POA: Diagnosis not present

## 2021-09-19 DIAGNOSIS — D631 Anemia in chronic kidney disease: Secondary | ICD-10-CM | POA: Diagnosis not present

## 2021-09-19 DIAGNOSIS — I2510 Atherosclerotic heart disease of native coronary artery without angina pectoris: Secondary | ICD-10-CM | POA: Diagnosis not present

## 2021-09-19 DIAGNOSIS — N3281 Overactive bladder: Secondary | ICD-10-CM | POA: Diagnosis not present

## 2021-09-19 DIAGNOSIS — K5790 Diverticulosis of intestine, part unspecified, without perforation or abscess without bleeding: Secondary | ICD-10-CM | POA: Diagnosis not present

## 2021-09-19 DIAGNOSIS — I130 Hypertensive heart and chronic kidney disease with heart failure and stage 1 through stage 4 chronic kidney disease, or unspecified chronic kidney disease: Secondary | ICD-10-CM | POA: Diagnosis not present

## 2021-09-19 DIAGNOSIS — M19041 Primary osteoarthritis, right hand: Secondary | ICD-10-CM | POA: Diagnosis not present

## 2021-09-19 DIAGNOSIS — N1831 Chronic kidney disease, stage 3a: Secondary | ICD-10-CM | POA: Diagnosis not present

## 2021-09-19 DIAGNOSIS — H913 Deaf nonspeaking, not elsewhere classified: Secondary | ICD-10-CM | POA: Diagnosis not present

## 2021-09-19 DIAGNOSIS — E11621 Type 2 diabetes mellitus with foot ulcer: Secondary | ICD-10-CM | POA: Diagnosis not present

## 2021-09-19 DIAGNOSIS — E785 Hyperlipidemia, unspecified: Secondary | ICD-10-CM | POA: Diagnosis not present

## 2021-09-19 DIAGNOSIS — E1151 Type 2 diabetes mellitus with diabetic peripheral angiopathy without gangrene: Secondary | ICD-10-CM | POA: Diagnosis not present

## 2021-09-19 DIAGNOSIS — L97421 Non-pressure chronic ulcer of left heel and midfoot limited to breakdown of skin: Secondary | ICD-10-CM | POA: Diagnosis not present

## 2021-09-19 DIAGNOSIS — R338 Other retention of urine: Secondary | ICD-10-CM | POA: Diagnosis not present

## 2021-09-19 DIAGNOSIS — I5022 Chronic systolic (congestive) heart failure: Secondary | ICD-10-CM | POA: Diagnosis not present

## 2021-09-19 DIAGNOSIS — M47812 Spondylosis without myelopathy or radiculopathy, cervical region: Secondary | ICD-10-CM | POA: Diagnosis not present

## 2021-09-20 ENCOUNTER — Telehealth: Payer: Self-pay | Admitting: *Deleted

## 2021-09-20 NOTE — Telephone Encounter (Signed)
Melissa called from [facility] and stated that the nurse needed verbal orders to go and see the patient 2 times a week for 8 weeks and per Dr Cannon Kettle that was ok and to use prisma and dry sterile dressings to the wounds and to call the office if any concerns or questions. Lattie Haw

## 2021-09-22 DIAGNOSIS — K219 Gastro-esophageal reflux disease without esophagitis: Secondary | ICD-10-CM | POA: Diagnosis not present

## 2021-09-22 DIAGNOSIS — I252 Old myocardial infarction: Secondary | ICD-10-CM | POA: Diagnosis not present

## 2021-09-22 DIAGNOSIS — M24542 Contracture, left hand: Secondary | ICD-10-CM | POA: Diagnosis not present

## 2021-09-22 DIAGNOSIS — N1831 Chronic kidney disease, stage 3a: Secondary | ICD-10-CM | POA: Diagnosis not present

## 2021-09-22 DIAGNOSIS — E785 Hyperlipidemia, unspecified: Secondary | ICD-10-CM | POA: Diagnosis not present

## 2021-09-22 DIAGNOSIS — I700 Atherosclerosis of aorta: Secondary | ICD-10-CM | POA: Diagnosis not present

## 2021-09-22 DIAGNOSIS — N3281 Overactive bladder: Secondary | ICD-10-CM | POA: Diagnosis not present

## 2021-09-22 DIAGNOSIS — E11621 Type 2 diabetes mellitus with foot ulcer: Secondary | ICD-10-CM | POA: Diagnosis not present

## 2021-09-22 DIAGNOSIS — M19041 Primary osteoarthritis, right hand: Secondary | ICD-10-CM | POA: Diagnosis not present

## 2021-09-22 DIAGNOSIS — H913 Deaf nonspeaking, not elsewhere classified: Secondary | ICD-10-CM | POA: Diagnosis not present

## 2021-09-22 DIAGNOSIS — L97421 Non-pressure chronic ulcer of left heel and midfoot limited to breakdown of skin: Secondary | ICD-10-CM | POA: Diagnosis not present

## 2021-09-22 DIAGNOSIS — R338 Other retention of urine: Secondary | ICD-10-CM | POA: Diagnosis not present

## 2021-09-22 DIAGNOSIS — E1151 Type 2 diabetes mellitus with diabetic peripheral angiopathy without gangrene: Secondary | ICD-10-CM | POA: Diagnosis not present

## 2021-09-22 DIAGNOSIS — D509 Iron deficiency anemia, unspecified: Secondary | ICD-10-CM | POA: Diagnosis not present

## 2021-09-22 DIAGNOSIS — E1122 Type 2 diabetes mellitus with diabetic chronic kidney disease: Secondary | ICD-10-CM | POA: Diagnosis not present

## 2021-09-22 DIAGNOSIS — M24541 Contracture, right hand: Secondary | ICD-10-CM | POA: Diagnosis not present

## 2021-09-22 DIAGNOSIS — K5790 Diverticulosis of intestine, part unspecified, without perforation or abscess without bleeding: Secondary | ICD-10-CM | POA: Diagnosis not present

## 2021-09-22 DIAGNOSIS — I130 Hypertensive heart and chronic kidney disease with heart failure and stage 1 through stage 4 chronic kidney disease, or unspecified chronic kidney disease: Secondary | ICD-10-CM | POA: Diagnosis not present

## 2021-09-22 DIAGNOSIS — D631 Anemia in chronic kidney disease: Secondary | ICD-10-CM | POA: Diagnosis not present

## 2021-09-22 DIAGNOSIS — E1142 Type 2 diabetes mellitus with diabetic polyneuropathy: Secondary | ICD-10-CM | POA: Diagnosis not present

## 2021-09-22 DIAGNOSIS — I5022 Chronic systolic (congestive) heart failure: Secondary | ICD-10-CM | POA: Diagnosis not present

## 2021-09-22 DIAGNOSIS — M47812 Spondylosis without myelopathy or radiculopathy, cervical region: Secondary | ICD-10-CM | POA: Diagnosis not present

## 2021-09-22 DIAGNOSIS — I2510 Atherosclerotic heart disease of native coronary artery without angina pectoris: Secondary | ICD-10-CM | POA: Diagnosis not present

## 2021-09-24 ENCOUNTER — Other Ambulatory Visit: Payer: Self-pay | Admitting: Interventional Cardiology

## 2021-09-26 ENCOUNTER — Telehealth: Payer: Self-pay | Admitting: Sports Medicine

## 2021-09-26 DIAGNOSIS — D509 Iron deficiency anemia, unspecified: Secondary | ICD-10-CM | POA: Diagnosis not present

## 2021-09-26 DIAGNOSIS — N3281 Overactive bladder: Secondary | ICD-10-CM | POA: Diagnosis not present

## 2021-09-26 DIAGNOSIS — E1142 Type 2 diabetes mellitus with diabetic polyneuropathy: Secondary | ICD-10-CM | POA: Diagnosis not present

## 2021-09-26 DIAGNOSIS — R338 Other retention of urine: Secondary | ICD-10-CM | POA: Diagnosis not present

## 2021-09-26 DIAGNOSIS — I2510 Atherosclerotic heart disease of native coronary artery without angina pectoris: Secondary | ICD-10-CM | POA: Diagnosis not present

## 2021-09-26 DIAGNOSIS — K219 Gastro-esophageal reflux disease without esophagitis: Secondary | ICD-10-CM | POA: Diagnosis not present

## 2021-09-26 DIAGNOSIS — L97421 Non-pressure chronic ulcer of left heel and midfoot limited to breakdown of skin: Secondary | ICD-10-CM | POA: Diagnosis not present

## 2021-09-26 DIAGNOSIS — N1831 Chronic kidney disease, stage 3a: Secondary | ICD-10-CM | POA: Diagnosis not present

## 2021-09-26 DIAGNOSIS — K5790 Diverticulosis of intestine, part unspecified, without perforation or abscess without bleeding: Secondary | ICD-10-CM | POA: Diagnosis not present

## 2021-09-26 DIAGNOSIS — H913 Deaf nonspeaking, not elsewhere classified: Secondary | ICD-10-CM | POA: Diagnosis not present

## 2021-09-26 DIAGNOSIS — I252 Old myocardial infarction: Secondary | ICD-10-CM | POA: Diagnosis not present

## 2021-09-26 DIAGNOSIS — E785 Hyperlipidemia, unspecified: Secondary | ICD-10-CM | POA: Diagnosis not present

## 2021-09-26 DIAGNOSIS — M47812 Spondylosis without myelopathy or radiculopathy, cervical region: Secondary | ICD-10-CM | POA: Diagnosis not present

## 2021-09-26 DIAGNOSIS — I5022 Chronic systolic (congestive) heart failure: Secondary | ICD-10-CM | POA: Diagnosis not present

## 2021-09-26 DIAGNOSIS — I700 Atherosclerosis of aorta: Secondary | ICD-10-CM | POA: Diagnosis not present

## 2021-09-26 DIAGNOSIS — M24542 Contracture, left hand: Secondary | ICD-10-CM | POA: Diagnosis not present

## 2021-09-26 DIAGNOSIS — I130 Hypertensive heart and chronic kidney disease with heart failure and stage 1 through stage 4 chronic kidney disease, or unspecified chronic kidney disease: Secondary | ICD-10-CM | POA: Diagnosis not present

## 2021-09-26 DIAGNOSIS — M19041 Primary osteoarthritis, right hand: Secondary | ICD-10-CM | POA: Diagnosis not present

## 2021-09-26 DIAGNOSIS — M24541 Contracture, right hand: Secondary | ICD-10-CM | POA: Diagnosis not present

## 2021-09-26 DIAGNOSIS — E11621 Type 2 diabetes mellitus with foot ulcer: Secondary | ICD-10-CM | POA: Diagnosis not present

## 2021-09-26 DIAGNOSIS — E1151 Type 2 diabetes mellitus with diabetic peripheral angiopathy without gangrene: Secondary | ICD-10-CM | POA: Diagnosis not present

## 2021-09-26 DIAGNOSIS — D631 Anemia in chronic kidney disease: Secondary | ICD-10-CM | POA: Diagnosis not present

## 2021-09-26 DIAGNOSIS — E1122 Type 2 diabetes mellitus with diabetic chronic kidney disease: Secondary | ICD-10-CM | POA: Diagnosis not present

## 2021-09-26 NOTE — Telephone Encounter (Signed)
-----   Message from Dennis Bast sent at 09/16/2021  9:02 PM EST ----- Regarding: FW: Help with this patient ----- Message ----- From: Landis Martins, DPM Sent: 09/16/2021   6:16 PM EST To: Olevia Bowens Little Subject: Help with this patient                         Patient is currently getting custom molded insole made at [facility] clinic.  Patient has a left transmetatarsal amputation and a history of a plantar wound that needs to be offloaded.  Patient has severe arthritis and CMT and has very little ankle dorsiflexion.  Patient wants a boot or a shoe that will accommodate his custom molded insoles.  He is supposed to get his insoles on February 13 from [place] however [facility] told him that they could not get a different style shoe or boot for him.  Do you think you could see patient once he gets his insoles from [facility] to see how we can accommodate him with a shoe or boot? Thanks Dr. Cannon Kettle

## 2021-09-26 NOTE — Telephone Encounter (Signed)
Left message for pt to call to get scheduled to see Aaron Edelman to pick out diabetic shoes in [place] office.

## 2021-09-28 DIAGNOSIS — E1142 Type 2 diabetes mellitus with diabetic polyneuropathy: Secondary | ICD-10-CM | POA: Diagnosis not present

## 2021-09-28 DIAGNOSIS — E11621 Type 2 diabetes mellitus with foot ulcer: Secondary | ICD-10-CM | POA: Diagnosis not present

## 2021-09-28 DIAGNOSIS — L97421 Non-pressure chronic ulcer of left heel and midfoot limited to breakdown of skin: Secondary | ICD-10-CM | POA: Diagnosis not present

## 2021-09-28 DIAGNOSIS — E1151 Type 2 diabetes mellitus with diabetic peripheral angiopathy without gangrene: Secondary | ICD-10-CM | POA: Diagnosis not present

## 2021-09-29 DIAGNOSIS — E1142 Type 2 diabetes mellitus with diabetic polyneuropathy: Secondary | ICD-10-CM | POA: Diagnosis not present

## 2021-09-29 DIAGNOSIS — I252 Old myocardial infarction: Secondary | ICD-10-CM | POA: Diagnosis not present

## 2021-09-29 DIAGNOSIS — E785 Hyperlipidemia, unspecified: Secondary | ICD-10-CM | POA: Diagnosis not present

## 2021-09-29 DIAGNOSIS — D631 Anemia in chronic kidney disease: Secondary | ICD-10-CM | POA: Diagnosis not present

## 2021-09-29 DIAGNOSIS — R338 Other retention of urine: Secondary | ICD-10-CM | POA: Diagnosis not present

## 2021-09-29 DIAGNOSIS — I700 Atherosclerosis of aorta: Secondary | ICD-10-CM | POA: Diagnosis not present

## 2021-09-29 DIAGNOSIS — M24541 Contracture, right hand: Secondary | ICD-10-CM | POA: Diagnosis not present

## 2021-09-29 DIAGNOSIS — E1122 Type 2 diabetes mellitus with diabetic chronic kidney disease: Secondary | ICD-10-CM | POA: Diagnosis not present

## 2021-09-29 DIAGNOSIS — I5022 Chronic systolic (congestive) heart failure: Secondary | ICD-10-CM | POA: Diagnosis not present

## 2021-09-29 DIAGNOSIS — M24542 Contracture, left hand: Secondary | ICD-10-CM | POA: Diagnosis not present

## 2021-09-29 DIAGNOSIS — D509 Iron deficiency anemia, unspecified: Secondary | ICD-10-CM | POA: Diagnosis not present

## 2021-09-29 DIAGNOSIS — I2510 Atherosclerotic heart disease of native coronary artery without angina pectoris: Secondary | ICD-10-CM | POA: Diagnosis not present

## 2021-09-29 DIAGNOSIS — K5790 Diverticulosis of intestine, part unspecified, without perforation or abscess without bleeding: Secondary | ICD-10-CM | POA: Diagnosis not present

## 2021-09-29 DIAGNOSIS — K219 Gastro-esophageal reflux disease without esophagitis: Secondary | ICD-10-CM | POA: Diagnosis not present

## 2021-09-29 DIAGNOSIS — I130 Hypertensive heart and chronic kidney disease with heart failure and stage 1 through stage 4 chronic kidney disease, or unspecified chronic kidney disease: Secondary | ICD-10-CM | POA: Diagnosis not present

## 2021-09-29 DIAGNOSIS — E1151 Type 2 diabetes mellitus with diabetic peripheral angiopathy without gangrene: Secondary | ICD-10-CM | POA: Diagnosis not present

## 2021-09-29 DIAGNOSIS — N1831 Chronic kidney disease, stage 3a: Secondary | ICD-10-CM | POA: Diagnosis not present

## 2021-09-29 DIAGNOSIS — M47812 Spondylosis without myelopathy or radiculopathy, cervical region: Secondary | ICD-10-CM | POA: Diagnosis not present

## 2021-09-29 DIAGNOSIS — N3281 Overactive bladder: Secondary | ICD-10-CM | POA: Diagnosis not present

## 2021-09-29 DIAGNOSIS — L97421 Non-pressure chronic ulcer of left heel and midfoot limited to breakdown of skin: Secondary | ICD-10-CM | POA: Diagnosis not present

## 2021-09-29 DIAGNOSIS — M19041 Primary osteoarthritis, right hand: Secondary | ICD-10-CM | POA: Diagnosis not present

## 2021-09-29 DIAGNOSIS — H913 Deaf nonspeaking, not elsewhere classified: Secondary | ICD-10-CM | POA: Diagnosis not present

## 2021-09-29 DIAGNOSIS — E11621 Type 2 diabetes mellitus with foot ulcer: Secondary | ICD-10-CM | POA: Diagnosis not present

## 2021-09-30 DIAGNOSIS — N3281 Overactive bladder: Secondary | ICD-10-CM | POA: Diagnosis not present

## 2021-09-30 DIAGNOSIS — R338 Other retention of urine: Secondary | ICD-10-CM | POA: Diagnosis not present

## 2021-09-30 DIAGNOSIS — D509 Iron deficiency anemia, unspecified: Secondary | ICD-10-CM | POA: Diagnosis not present

## 2021-09-30 DIAGNOSIS — I700 Atherosclerosis of aorta: Secondary | ICD-10-CM | POA: Diagnosis not present

## 2021-09-30 DIAGNOSIS — M19041 Primary osteoarthritis, right hand: Secondary | ICD-10-CM | POA: Diagnosis not present

## 2021-09-30 DIAGNOSIS — M24541 Contracture, right hand: Secondary | ICD-10-CM | POA: Diagnosis not present

## 2021-09-30 DIAGNOSIS — E1151 Type 2 diabetes mellitus with diabetic peripheral angiopathy without gangrene: Secondary | ICD-10-CM | POA: Diagnosis not present

## 2021-09-30 DIAGNOSIS — I2510 Atherosclerotic heart disease of native coronary artery without angina pectoris: Secondary | ICD-10-CM | POA: Diagnosis not present

## 2021-09-30 DIAGNOSIS — D631 Anemia in chronic kidney disease: Secondary | ICD-10-CM | POA: Diagnosis not present

## 2021-09-30 DIAGNOSIS — E1122 Type 2 diabetes mellitus with diabetic chronic kidney disease: Secondary | ICD-10-CM | POA: Diagnosis not present

## 2021-09-30 DIAGNOSIS — I5022 Chronic systolic (congestive) heart failure: Secondary | ICD-10-CM | POA: Diagnosis not present

## 2021-09-30 DIAGNOSIS — N1831 Chronic kidney disease, stage 3a: Secondary | ICD-10-CM | POA: Diagnosis not present

## 2021-09-30 DIAGNOSIS — E1142 Type 2 diabetes mellitus with diabetic polyneuropathy: Secondary | ICD-10-CM | POA: Diagnosis not present

## 2021-09-30 DIAGNOSIS — E11621 Type 2 diabetes mellitus with foot ulcer: Secondary | ICD-10-CM | POA: Diagnosis not present

## 2021-09-30 DIAGNOSIS — K219 Gastro-esophageal reflux disease without esophagitis: Secondary | ICD-10-CM | POA: Diagnosis not present

## 2021-09-30 DIAGNOSIS — I252 Old myocardial infarction: Secondary | ICD-10-CM | POA: Diagnosis not present

## 2021-09-30 DIAGNOSIS — H913 Deaf nonspeaking, not elsewhere classified: Secondary | ICD-10-CM | POA: Diagnosis not present

## 2021-09-30 DIAGNOSIS — K5790 Diverticulosis of intestine, part unspecified, without perforation or abscess without bleeding: Secondary | ICD-10-CM | POA: Diagnosis not present

## 2021-09-30 DIAGNOSIS — I130 Hypertensive heart and chronic kidney disease with heart failure and stage 1 through stage 4 chronic kidney disease, or unspecified chronic kidney disease: Secondary | ICD-10-CM | POA: Diagnosis not present

## 2021-09-30 DIAGNOSIS — L97421 Non-pressure chronic ulcer of left heel and midfoot limited to breakdown of skin: Secondary | ICD-10-CM | POA: Diagnosis not present

## 2021-09-30 DIAGNOSIS — M47812 Spondylosis without myelopathy or radiculopathy, cervical region: Secondary | ICD-10-CM | POA: Diagnosis not present

## 2021-09-30 DIAGNOSIS — M24542 Contracture, left hand: Secondary | ICD-10-CM | POA: Diagnosis not present

## 2021-09-30 DIAGNOSIS — E785 Hyperlipidemia, unspecified: Secondary | ICD-10-CM | POA: Diagnosis not present

## 2021-10-03 DIAGNOSIS — N3281 Overactive bladder: Secondary | ICD-10-CM | POA: Diagnosis not present

## 2021-10-03 DIAGNOSIS — L97421 Non-pressure chronic ulcer of left heel and midfoot limited to breakdown of skin: Secondary | ICD-10-CM | POA: Diagnosis not present

## 2021-10-03 DIAGNOSIS — M24542 Contracture, left hand: Secondary | ICD-10-CM | POA: Diagnosis not present

## 2021-10-03 DIAGNOSIS — D509 Iron deficiency anemia, unspecified: Secondary | ICD-10-CM | POA: Diagnosis not present

## 2021-10-03 DIAGNOSIS — K5790 Diverticulosis of intestine, part unspecified, without perforation or abscess without bleeding: Secondary | ICD-10-CM | POA: Diagnosis not present

## 2021-10-03 DIAGNOSIS — H913 Deaf nonspeaking, not elsewhere classified: Secondary | ICD-10-CM | POA: Diagnosis not present

## 2021-10-03 DIAGNOSIS — R338 Other retention of urine: Secondary | ICD-10-CM | POA: Diagnosis not present

## 2021-10-03 DIAGNOSIS — E785 Hyperlipidemia, unspecified: Secondary | ICD-10-CM | POA: Diagnosis not present

## 2021-10-03 DIAGNOSIS — D631 Anemia in chronic kidney disease: Secondary | ICD-10-CM | POA: Diagnosis not present

## 2021-10-03 DIAGNOSIS — N1831 Chronic kidney disease, stage 3a: Secondary | ICD-10-CM | POA: Diagnosis not present

## 2021-10-03 DIAGNOSIS — I5022 Chronic systolic (congestive) heart failure: Secondary | ICD-10-CM | POA: Diagnosis not present

## 2021-10-03 DIAGNOSIS — I130 Hypertensive heart and chronic kidney disease with heart failure and stage 1 through stage 4 chronic kidney disease, or unspecified chronic kidney disease: Secondary | ICD-10-CM | POA: Diagnosis not present

## 2021-10-03 DIAGNOSIS — E1142 Type 2 diabetes mellitus with diabetic polyneuropathy: Secondary | ICD-10-CM | POA: Diagnosis not present

## 2021-10-03 DIAGNOSIS — E1122 Type 2 diabetes mellitus with diabetic chronic kidney disease: Secondary | ICD-10-CM | POA: Diagnosis not present

## 2021-10-03 DIAGNOSIS — I252 Old myocardial infarction: Secondary | ICD-10-CM | POA: Diagnosis not present

## 2021-10-03 DIAGNOSIS — M19041 Primary osteoarthritis, right hand: Secondary | ICD-10-CM | POA: Diagnosis not present

## 2021-10-03 DIAGNOSIS — E1151 Type 2 diabetes mellitus with diabetic peripheral angiopathy without gangrene: Secondary | ICD-10-CM | POA: Diagnosis not present

## 2021-10-03 DIAGNOSIS — I700 Atherosclerosis of aorta: Secondary | ICD-10-CM | POA: Diagnosis not present

## 2021-10-03 DIAGNOSIS — K219 Gastro-esophageal reflux disease without esophagitis: Secondary | ICD-10-CM | POA: Diagnosis not present

## 2021-10-03 DIAGNOSIS — M47812 Spondylosis without myelopathy or radiculopathy, cervical region: Secondary | ICD-10-CM | POA: Diagnosis not present

## 2021-10-03 DIAGNOSIS — M24541 Contracture, right hand: Secondary | ICD-10-CM | POA: Diagnosis not present

## 2021-10-03 DIAGNOSIS — E11621 Type 2 diabetes mellitus with foot ulcer: Secondary | ICD-10-CM | POA: Diagnosis not present

## 2021-10-03 DIAGNOSIS — I2510 Atherosclerotic heart disease of native coronary artery without angina pectoris: Secondary | ICD-10-CM | POA: Diagnosis not present

## 2021-10-06 DIAGNOSIS — E785 Hyperlipidemia, unspecified: Secondary | ICD-10-CM | POA: Diagnosis not present

## 2021-10-06 DIAGNOSIS — H913 Deaf nonspeaking, not elsewhere classified: Secondary | ICD-10-CM | POA: Diagnosis not present

## 2021-10-06 DIAGNOSIS — M24541 Contracture, right hand: Secondary | ICD-10-CM | POA: Diagnosis not present

## 2021-10-06 DIAGNOSIS — I2510 Atherosclerotic heart disease of native coronary artery without angina pectoris: Secondary | ICD-10-CM | POA: Diagnosis not present

## 2021-10-06 DIAGNOSIS — M19041 Primary osteoarthritis, right hand: Secondary | ICD-10-CM | POA: Diagnosis not present

## 2021-10-06 DIAGNOSIS — E1151 Type 2 diabetes mellitus with diabetic peripheral angiopathy without gangrene: Secondary | ICD-10-CM | POA: Diagnosis not present

## 2021-10-06 DIAGNOSIS — I252 Old myocardial infarction: Secondary | ICD-10-CM | POA: Diagnosis not present

## 2021-10-06 DIAGNOSIS — E1122 Type 2 diabetes mellitus with diabetic chronic kidney disease: Secondary | ICD-10-CM | POA: Diagnosis not present

## 2021-10-06 DIAGNOSIS — K5790 Diverticulosis of intestine, part unspecified, without perforation or abscess without bleeding: Secondary | ICD-10-CM | POA: Diagnosis not present

## 2021-10-06 DIAGNOSIS — L97421 Non-pressure chronic ulcer of left heel and midfoot limited to breakdown of skin: Secondary | ICD-10-CM | POA: Diagnosis not present

## 2021-10-06 DIAGNOSIS — I700 Atherosclerosis of aorta: Secondary | ICD-10-CM | POA: Diagnosis not present

## 2021-10-06 DIAGNOSIS — D509 Iron deficiency anemia, unspecified: Secondary | ICD-10-CM | POA: Diagnosis not present

## 2021-10-06 DIAGNOSIS — M24542 Contracture, left hand: Secondary | ICD-10-CM | POA: Diagnosis not present

## 2021-10-06 DIAGNOSIS — K219 Gastro-esophageal reflux disease without esophagitis: Secondary | ICD-10-CM | POA: Diagnosis not present

## 2021-10-06 DIAGNOSIS — E1142 Type 2 diabetes mellitus with diabetic polyneuropathy: Secondary | ICD-10-CM | POA: Diagnosis not present

## 2021-10-06 DIAGNOSIS — I5022 Chronic systolic (congestive) heart failure: Secondary | ICD-10-CM | POA: Diagnosis not present

## 2021-10-06 DIAGNOSIS — I130 Hypertensive heart and chronic kidney disease with heart failure and stage 1 through stage 4 chronic kidney disease, or unspecified chronic kidney disease: Secondary | ICD-10-CM | POA: Diagnosis not present

## 2021-10-06 DIAGNOSIS — E11621 Type 2 diabetes mellitus with foot ulcer: Secondary | ICD-10-CM | POA: Diagnosis not present

## 2021-10-06 DIAGNOSIS — R338 Other retention of urine: Secondary | ICD-10-CM | POA: Diagnosis not present

## 2021-10-06 DIAGNOSIS — N1831 Chronic kidney disease, stage 3a: Secondary | ICD-10-CM | POA: Diagnosis not present

## 2021-10-06 DIAGNOSIS — D631 Anemia in chronic kidney disease: Secondary | ICD-10-CM | POA: Diagnosis not present

## 2021-10-06 DIAGNOSIS — N3281 Overactive bladder: Secondary | ICD-10-CM | POA: Diagnosis not present

## 2021-10-06 DIAGNOSIS — M47812 Spondylosis without myelopathy or radiculopathy, cervical region: Secondary | ICD-10-CM | POA: Diagnosis not present

## 2021-10-11 DIAGNOSIS — E785 Hyperlipidemia, unspecified: Secondary | ICD-10-CM | POA: Diagnosis not present

## 2021-10-11 DIAGNOSIS — K5790 Diverticulosis of intestine, part unspecified, without perforation or abscess without bleeding: Secondary | ICD-10-CM | POA: Diagnosis not present

## 2021-10-11 DIAGNOSIS — K219 Gastro-esophageal reflux disease without esophagitis: Secondary | ICD-10-CM | POA: Diagnosis not present

## 2021-10-11 DIAGNOSIS — M47812 Spondylosis without myelopathy or radiculopathy, cervical region: Secondary | ICD-10-CM | POA: Diagnosis not present

## 2021-10-11 DIAGNOSIS — I252 Old myocardial infarction: Secondary | ICD-10-CM | POA: Diagnosis not present

## 2021-10-11 DIAGNOSIS — E1122 Type 2 diabetes mellitus with diabetic chronic kidney disease: Secondary | ICD-10-CM | POA: Diagnosis not present

## 2021-10-11 DIAGNOSIS — M19041 Primary osteoarthritis, right hand: Secondary | ICD-10-CM | POA: Diagnosis not present

## 2021-10-11 DIAGNOSIS — H913 Deaf nonspeaking, not elsewhere classified: Secondary | ICD-10-CM | POA: Diagnosis not present

## 2021-10-11 DIAGNOSIS — E1142 Type 2 diabetes mellitus with diabetic polyneuropathy: Secondary | ICD-10-CM | POA: Diagnosis not present

## 2021-10-11 DIAGNOSIS — I130 Hypertensive heart and chronic kidney disease with heart failure and stage 1 through stage 4 chronic kidney disease, or unspecified chronic kidney disease: Secondary | ICD-10-CM | POA: Diagnosis not present

## 2021-10-11 DIAGNOSIS — I5022 Chronic systolic (congestive) heart failure: Secondary | ICD-10-CM | POA: Diagnosis not present

## 2021-10-11 DIAGNOSIS — I2510 Atherosclerotic heart disease of native coronary artery without angina pectoris: Secondary | ICD-10-CM | POA: Diagnosis not present

## 2021-10-11 DIAGNOSIS — R338 Other retention of urine: Secondary | ICD-10-CM | POA: Diagnosis not present

## 2021-10-11 DIAGNOSIS — D509 Iron deficiency anemia, unspecified: Secondary | ICD-10-CM | POA: Diagnosis not present

## 2021-10-11 DIAGNOSIS — M24542 Contracture, left hand: Secondary | ICD-10-CM | POA: Diagnosis not present

## 2021-10-11 DIAGNOSIS — I700 Atherosclerosis of aorta: Secondary | ICD-10-CM | POA: Diagnosis not present

## 2021-10-11 DIAGNOSIS — E11621 Type 2 diabetes mellitus with foot ulcer: Secondary | ICD-10-CM | POA: Diagnosis not present

## 2021-10-11 DIAGNOSIS — L97421 Non-pressure chronic ulcer of left heel and midfoot limited to breakdown of skin: Secondary | ICD-10-CM | POA: Diagnosis not present

## 2021-10-11 DIAGNOSIS — N3281 Overactive bladder: Secondary | ICD-10-CM | POA: Diagnosis not present

## 2021-10-11 DIAGNOSIS — N1831 Chronic kidney disease, stage 3a: Secondary | ICD-10-CM | POA: Diagnosis not present

## 2021-10-11 DIAGNOSIS — D631 Anemia in chronic kidney disease: Secondary | ICD-10-CM | POA: Diagnosis not present

## 2021-10-11 DIAGNOSIS — M24541 Contracture, right hand: Secondary | ICD-10-CM | POA: Diagnosis not present

## 2021-10-11 DIAGNOSIS — E1151 Type 2 diabetes mellitus with diabetic peripheral angiopathy without gangrene: Secondary | ICD-10-CM | POA: Diagnosis not present

## 2021-10-14 ENCOUNTER — Encounter: Payer: Self-pay | Admitting: Sports Medicine

## 2021-10-14 ENCOUNTER — Ambulatory Visit: Payer: Medicare Other | Admitting: Sports Medicine

## 2021-10-14 ENCOUNTER — Other Ambulatory Visit: Payer: Self-pay

## 2021-10-14 DIAGNOSIS — H913 Deaf nonspeaking, not elsewhere classified: Secondary | ICD-10-CM

## 2021-10-14 DIAGNOSIS — R238 Other skin changes: Secondary | ICD-10-CM

## 2021-10-14 DIAGNOSIS — I739 Peripheral vascular disease, unspecified: Secondary | ICD-10-CM

## 2021-10-14 DIAGNOSIS — E1142 Type 2 diabetes mellitus with diabetic polyneuropathy: Secondary | ICD-10-CM

## 2021-10-14 DIAGNOSIS — L97521 Non-pressure chronic ulcer of other part of left foot limited to breakdown of skin: Secondary | ICD-10-CM

## 2021-10-14 DIAGNOSIS — R269 Unspecified abnormalities of gait and mobility: Secondary | ICD-10-CM

## 2021-10-14 DIAGNOSIS — Z89432 Acquired absence of left foot: Secondary | ICD-10-CM

## 2021-10-14 NOTE — Progress Notes (Signed)
Subjective: Christian [name] is a 81 y.o. male patient seen in office for follow-up evaluation of left foot ulcer.  Patient is assisted by son who reports that he is still waiting on getting the insoles of which they are supposed to get those on February 13 and has an appointment on March 22 here in office to have things modified.  Reports that there has been some new changes with the wound.  Patient denies any pain or constitutional symptoms at this time.  Per home nurse the wound got worse and was draining more.  Patient Active Problem List   Diagnosis Date Noted   Decreased functional mobility and endurance 06/15/2021   Pressure injury of skin 03/15/2021   Wound cellulitis 03/12/2021   Acute-on-chronic kidney injury ([place]) 03/12/2021   Fall at home, initial encounter 09/14/2020   Stage 3a chronic kidney disease ([place]) 09/11/2020   Gastro-esophageal reflux disease with esophagitis 03/16/2020   Intractable vomiting with nausea 03/13/2020   Decreased activities of daily living (ADL) 02/26/2020   Dysphagia 02/26/2020   Impaired functional mobility, balance, gait, and endurance 02/26/2020   Acute respiratory failure with hypoxia (HCC) 10/27/2019   Sepsis ([place]) 10/21/2019   Generalized anxiety disorder 09/01/2019   Primary insomnia 09/01/2019   GERD without esophagitis 07/23/2019   Meningioma ([place]) 07/23/2019   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   Ulcer of left foot, limited to breakdown of skin ([place]) 02/07/2018   Chronic pain of both shoulders 08/09/2017   Pain in right wrist 08/09/2017   Atherosclerosis of renal artery ([place]) 04/24/2017   Type 2 diabetes mellitus without complications ([place]) 98/33/8250   Chronic left-sided low back pain with left-sided sciatica 07/24/2016   Lower extremity edema 01/28/2015   Angina pectoris ([place]) 07/08/2014   Anemia, unspecified 05/06/2014   Acute gastric ulcer 08/27/2013   Duodenal ulcer disease  08/27/2013   IDA (iron deficiency anemia) 08/25/2013   Mixed hyperlipidemia 08/13/2013   Essential hypertension, benign 08/13/2013   Current Outpatient Medications on File Prior to Visit  Medication Sig Dispense Refill   ACCU-CHEK SMARTVIEW test strip USE 1 STRIP TO CHECK GLUCOSE TWICE DAILY AS DIRECTED     albuterol (VENTOLIN HFA) 108 (90 Base) MCG/ACT inhaler Inhale into the lungs.     amLODipine (NORVASC) 10 MG tablet Take 1 tablet (10 mg total) by mouth daily. 15 tablet 0   BREO ELLIPTA 200-25 MCG/ACT AEPB 1 puff daily.     escitalopram (LEXAPRO) 5 MG tablet Take 5 mg by mouth daily.     furosemide (LASIX) 20 MG tablet Take 1 tablet (20 mg total) by mouth daily. 30 tablet 0   HYDROcodone-acetaminophen (NORCO/VICODIN) 5-325 MG tablet Take 1 tablet by mouth 3 (three) times daily as needed for moderate pain or severe pain. 15 tablet 0   isosorbide mononitrate (IMDUR) 60 MG 24 hr tablet Take 1 tablet (60 mg total) by mouth daily. Please make overdue appt with Dr. Irish Lack before anymore refills. Thank you 3rd and Final Attempt 15 tablet 0   ketoconazole (NIZORAL) 2 % cream Apply topically.     linezolid (ZYVOX) 600 MG tablet Take 1 tablet (600 mg total) by mouth 2 (two) times daily. 14 tablet 0   LORazepam (ATIVAN) 1 MG tablet Take 1 mg by mouth daily as needed for anxiety or sleep.     Multiple Vitamin (MULTIVITAMIN) capsule Take 1 capsule by mouth daily.     mupirocin cream (BACTROBAN) 2 % Apply  1 application topically 2 (two) times daily. 15 g 0   nitroGLYCERIN (NITROSTAT) 0.4 MG SL tablet Place 1 tablet (0.4 mg total) under the tongue every 5 (five) minutes as needed for chest pain. Please keep upcoming appt in May with Dr. Irish Lack. Thank you 75 tablet 0   polyethylene glycol (MIRALAX / GLYCOLAX) 17 g packet Take 17 g by mouth daily as needed for mild constipation. 14 each 0   pravastatin (PRAVACHOL) 40 MG tablet Take 40 mg by mouth daily.     promethazine-dextromethorphan  (PROMETHAZINE-DM) 6.25-15 MG/5ML syrup SMARTSIG:5-10 Milliliter(s) By Mouth Every 6 Hours PRN     tamsulosin (FLOMAX) 0.4 MG CAPS capsule Take 0.4 mg by mouth in the morning and at bedtime.     tiZANidine (ZANAFLEX) 4 MG tablet Take by mouth.     torsemide (DEMADEX) 20 MG tablet Take by mouth.     [DISCONTINUED] pantoprazole (PROTONIX) 40 MG tablet Take 1 tablet (40 mg total) by mouth daily. (Patient not taking: Reported on 03/12/2021) 90 tablet 2   Current Facility-Administered Medications on File Prior to Visit  Medication Dose Route Frequency Provider Last Rate Last Admin   cadexomer iodine (IODOSORB) 0.9 % gel   Topical Once per day on Mon Wed Fri Duda, Marcus V, MD       Allergies  Allergen Reactions   Lipitor [Atorvastatin] Other (See Comments)    unknown    No results found for this or any previous visit (from the past 2160 hour(s)).    Objective: There were no vitals filed for this visit.  General: Patient is awake, alert, oriented x 3 and in no acute distress.  Dermatology: Skin is warm and dry bilateral with a partial thickness ulceration present plantar left foot TMA stump site . Ulceration measures 1 cm x 1 cm x 0.2 cm. There is a keratotic border with a granular base with significant hemorrhagic blistering, the ulceration does not probe to bone. There is scant malodor, no active drainage, minimal faint erythema, +1 edema. No acute signs of infection.  Right hallux on assist this visit.    Vascular: Dorsalis Pedis pulse = 1/4 Bilateral,  Posterior Tibial pulse = 0/4 Bilateral,  Capillary Fill Time < 5 seconds on right, +1 pitting edema bilateral.  Neurologic: Protective sensation absent bilateral..  Musculosketal: Prominent metatarsal heads right foot, s/p L TMA.  Hallux deformity.  Ankle deformity Limited range of motion on left.  No Pain with palpation to ulcerated area on left. No pain with compression to calves bilateral.  No results for input(s): GRAMSTAIN, LABORGA  in the last 8760 hours.  Assessment and Plan:  Problem List Items Addressed This Visit       Musculoskeletal and Integument   Ulcer of left foot, limited to breakdown of skin ([place]) - Primary   Relevant Orders   WOUND CULTURE   Other Visit Diagnoses     Blister of periwound skin       PAD (peripheral artery disease) ([place])       History of transmetatarsal amputation of left foot ([place])       Diabetic polyneuropathy associated with type 2 diabetes mellitus ([place])       Deaf mutism, acquired       Abnormality of gait          -Re-Discussed again with patient progression of wound and the importance of wearing his shoes to keep pressure off of the area to help with healing until he can get his custom  insole like before -Patient to see Aaron Edelman to see what shoe we can order that could work with his custom insoles - Excisionally dedbrided ulceration at left plantar TMA stump site and to the right great toe to healthy bleeding borders removing nonviable tissue using a sterile chisel blade. Wound measures post debridement as above.  Wound was debrided to the level of the dermis with viable wound base exposed to promote healing. Hemostasis was achieved with manuel pressure. Patient tolerated procedure well without any discomfort or anesthesia necessary for this wound debridement.  -Wound culture was obtained we will call patient to discuss if he needs additional oral antibiotics -Applied iodine ointment and dry sterile dressing with home nursing to do the same  -Encourage patient to continue with resting and decreasing the amount of time he spends ambulating to help with wound healing -Patient to return to office as scheduled or sooner if problems arise. At next visit will help to trim his toenails and fit him for shoes next visit. Landis Martins, DPM

## 2021-10-18 ENCOUNTER — Telehealth: Payer: Self-pay | Admitting: Sports Medicine

## 2021-10-18 ENCOUNTER — Other Ambulatory Visit: Payer: Self-pay | Admitting: Sports Medicine

## 2021-10-18 LAB — WOUND CULTURE

## 2021-10-18 MED ORDER — SULFAMETHOXAZOLE-TRIMETHOPRIM 400-80 MG PO TABS: 1.0000 | ORAL_TABLET | Freq: Two times a day (BID) | ORAL | 0 refills | Status: DC

## 2021-10-18 NOTE — Telephone Encounter (Signed)
Pt req culture results & if meds needed [facility]

## 2021-10-18 NOTE — Progress Notes (Signed)
Abnormal wound culture reveals MRSA skin infection.  I have sent Bactrim for patient to take as directed. -Dr, Cannon Kettle

## 2021-10-19 NOTE — Telephone Encounter (Signed)
Called and left a message on the voice mail thru the interpreter and stated to call the office if any concerns or questions. Lattie Haw

## 2021-10-20 ENCOUNTER — Telehealth: Payer: Self-pay | Admitting: *Deleted

## 2021-10-20 NOTE — Telephone Encounter (Signed)
Called and left a message for [facility] health nurse and stated that the culture came back MRSA and Dr Cannon Kettle sent over an antibiotic for the patient to take and to call the office if any concerns or questions. Lattie Haw

## 2021-11-09 ENCOUNTER — Other Ambulatory Visit: Payer: Self-pay | Admitting: Sports Medicine

## 2021-11-09 ENCOUNTER — Telehealth: Payer: Self-pay | Admitting: Sports Medicine

## 2021-11-09 MED ORDER — FUROSEMIDE 20 MG PO TABS: 20.0000 mg | ORAL_TABLET | Freq: Every day | ORAL | 0 refills | Status: DC

## 2021-11-09 NOTE — Progress Notes (Signed)
Refilled Lasix, patient to discuss with his PCP if he feels that it is not helping ?-Dr. Chauncey Cruel ?

## 2021-11-09 NOTE — Telephone Encounter (Signed)
Pt called stating he would like a refill on furosemide (LASIX) 20 MG tablet. He states he hasn't seen a difference since he's been taking it. Please advise.  ?

## 2021-11-11 ENCOUNTER — Ambulatory Visit: Payer: Medicare Other | Admitting: Sports Medicine

## 2021-11-15 ENCOUNTER — Other Ambulatory Visit: Payer: Self-pay

## 2021-11-15 ENCOUNTER — Encounter: Payer: Self-pay | Admitting: Sports Medicine

## 2021-11-15 ENCOUNTER — Ambulatory Visit: Payer: Medicare Other | Admitting: Sports Medicine

## 2021-11-15 DIAGNOSIS — L97511 Non-pressure chronic ulcer of other part of right foot limited to breakdown of skin: Secondary | ICD-10-CM

## 2021-11-15 DIAGNOSIS — H913 Deaf nonspeaking, not elsewhere classified: Secondary | ICD-10-CM

## 2021-11-15 DIAGNOSIS — Z89432 Acquired absence of left foot: Secondary | ICD-10-CM

## 2021-11-15 DIAGNOSIS — L97521 Non-pressure chronic ulcer of other part of left foot limited to breakdown of skin: Secondary | ICD-10-CM

## 2021-11-15 DIAGNOSIS — I739 Peripheral vascular disease, unspecified: Secondary | ICD-10-CM

## 2021-11-15 DIAGNOSIS — E1142 Type 2 diabetes mellitus with diabetic polyneuropathy: Secondary | ICD-10-CM

## 2021-11-15 NOTE — Progress Notes (Signed)
Subjective: Christian [name] is a 81 y.o. male patient seen in office for follow-up evaluation of left and right foot ulcer.  Patient is assisted by daughter who reports that he got his insoles but they are not right, Patient denies any pain or constitutional symptoms at this time.  Patient Active Problem List   Diagnosis Date Noted   Decreased functional mobility and endurance 06/15/2021   Pressure injury of skin 03/15/2021   Wound cellulitis 03/12/2021   Acute-on-chronic kidney injury ([place]) 03/12/2021   Fall at home, initial encounter 09/14/2020   Stage 3a chronic kidney disease ([place]) 09/11/2020   Gastro-esophageal reflux disease with esophagitis 03/16/2020   Intractable vomiting with nausea 03/13/2020   Decreased activities of daily living (ADL) 02/26/2020   Dysphagia 02/26/2020   Impaired functional mobility, balance, gait, and endurance 02/26/2020   Acute respiratory failure with hypoxia (HCC) 10/27/2019   Sepsis ([place]) 10/21/2019   Generalized anxiety disorder 09/01/2019   Primary insomnia 09/01/2019   GERD without esophagitis 07/23/2019   Meningioma ([place]) 07/23/2019   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   Ulcer of left foot, limited to breakdown of skin ([place]) 02/07/2018   Chronic pain of both shoulders 08/09/2017   Pain in right wrist 08/09/2017   Atherosclerosis of renal artery ([place]) 04/24/2017   Type 2 diabetes mellitus without complications ([place]) 63/14/9702   Chronic left-sided low back pain with left-sided sciatica 07/24/2016   Lower extremity edema 01/28/2015   Angina pectoris ([place]) 07/08/2014   Anemia, unspecified 05/06/2014   Acute gastric ulcer 08/27/2013   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     amLODipine (NORVASC) 10 MG tablet Take 1 tablet (10 mg total) by mouth daily. 15 tablet 0   amLODipine (NORVASC) 10 MG tablet Take 1 tablet by mouth daily.     BREO ELLIPTA 200-25 MCG/ACT AEPB 1 puff daily.     busPIRone (BUSPAR) 7.5 MG tablet Take 7.5 mg by mouth 2 (two) times daily.     diclofenac (VOLTAREN) 50 MG EC tablet Take 50 mg by mouth 2 (two) times daily.     escitalopram (LEXAPRO) 5 MG tablet Take 5 mg by mouth daily.     furosemide (LASIX) 20 MG tablet Take 1 tablet (20 mg total) by mouth daily. 30 tablet 0   HYDROcodone-acetaminophen (NORCO/VICODIN) 5-325 MG tablet Take 1 tablet by mouth 3 (three) times daily as needed for moderate pain or severe pain. 15 tablet 0   isosorbide mononitrate (IMDUR) 60 MG 24 hr tablet Take 1 tablet (60 mg total) by mouth daily. Please make overdue appt with Dr. Irish Lack before anymore refills. Thank you 3rd and Final Attempt 15 tablet 0   ketoconazole (NIZORAL) 2 % cream Apply topically.     linezolid (ZYVOX) 600 MG tablet Take 1 tablet (600 mg total) by mouth 2 (two) times daily. 14 tablet 0   LORazepam (ATIVAN) 1 MG tablet Take 1 mg by mouth daily as needed for anxiety or sleep.     Multiple Vitamin (MULTIVITAMIN) capsule Take 1 capsule by mouth daily.     mupirocin  cream (BACTROBAN) 2 % Apply 1 application topically 2 (two) times daily. 15 g 0   nitroGLYCERIN (NITROSTAT) 0.4 MG SL tablet Place 1 tablet (0.4 mg total) under the tongue every 5 (five) minutes as needed for chest pain. Please keep upcoming appt in May with Dr. Irish Lack. Thank you 75 tablet 0   polyethylene glycol (MIRALAX / GLYCOLAX) 17 g packet Take 17 g by mouth daily as needed for mild constipation. 14 each 0   polyethylene glycol powder (GLYCOLAX/MIRALAX) 17 GM/SCOOP powder Take by mouth.     pravastatin (PRAVACHOL) 40 MG tablet Take 40  mg by mouth daily.     promethazine-dextromethorphan (PROMETHAZINE-DM) 6.25-15 MG/5ML syrup SMARTSIG:5-10 Milliliter(s) By Mouth Every 6 Hours PRN     solifenacin (VESICARE) 5 MG tablet Take 5 mg by mouth daily.     sulfamethoxazole-trimethoprim (BACTRIM) 400-80 MG tablet Take 1 tablet by mouth 2 (two) times daily. 28 tablet 0   tamsulosin (FLOMAX) 0.4 MG CAPS capsule Take 0.4 mg by mouth in the morning and at bedtime.     tiZANidine (ZANAFLEX) 4 MG tablet Take by mouth.     torsemide (DEMADEX) 20 MG tablet Take by mouth.     [DISCONTINUED] pantoprazole (PROTONIX) 40 MG tablet Take 1 tablet (40 mg total) by mouth daily. (Patient not taking: Reported on 03/12/2021) 90 tablet 2   Current Facility-Administered Medications on File Prior to Visit  Medication Dose Route Frequency Provider Last Rate Last Admin   cadexomer iodine (IODOSORB) 0.9 % gel   Topical Once per day on Mon Wed Fri Duda, Marcus V, MD       Allergies  Allergen Reactions   Lipitor [Atorvastatin] Other (See Comments)    unknown    Recent Results (from the past 2160 hour(s))  WOUND CULTURE     Status: Abnormal   Collection Time: 10/14/21  3:14 PM   Specimen: Foot, Left; Wound   Wound Culture and sens  Result Value Ref Range   Gram Stain Result Final report    Organism ID, Bacteria Comment     Comment: Rare white blood cells.   Organism ID, Bacteria Comment     Comment: Many gram positive cocci.   Aerobic Bacterial Culture Final report (A)    Organism ID, Bacteria Comment (A)     Comment: Methicillin - resistant Staphylococcus aureus Based on resistance to oxacillin this isolate would be resistant to all currently available beta-lactam antimicrobial agents, with the exception of the newer cephalosporins with anti-MRSA activity, such as Ceftaroline Heavy growth    Organism ID, Bacteria Routine flora     Comment: Moderate growth   Antimicrobial Susceptibility Comment     Comment:       ** S = Susceptible; I =  Intermediate; R = Resistant **                    P = Positive; N = Negative             MICS are expressed in micrograms per mL    Antibiotic                 RSLT#1    RSLT#2    RSLT#3    RSLT#4 Ciprofloxacin                  S Clindamycin                    R Erythromycin  R Gentamicin                     S Levofloxacin                   S Linezolid                      S Oxacillin                      R Penicillin                     R Rifampin                       S Tetracycline                   R Trimethoprim/Sulfa             S Vancomycin                     S       Objective: There were no vitals filed for this visit.  General: Patient is awake, alert, oriented x 3 and in no acute distress.  Dermatology: Skin is warm and dry bilateral with a partial thickness ulceration present plantar left foot TMA stump site . Ulceration measures 1 cm x 0.5 cm x 0.2 cm. There is a keratotic border with a granular base with significant hemorrhagic blistering, the ulceration does not probe to bone. There is no malodor, no active drainage, no erythema, +1 edema. No acute signs of infection.  Right plantar foot there is a partial thickness ulceration and the plantar lateral midfoot with keratotic border with a granular base, the ulceration does not probe to bone. There is no malodor, no active drainage, no erythema.     Vascular: Dorsalis Pedis pulse = 1/4 Bilateral,  Posterior Tibial pulse = 0/4 Bilateral,  Capillary Fill Time < 5 seconds on right, +1 pitting edema bilateral.  Neurologic: Protective sensation absent bilateral..  Musculosketal: Prominent metatarsal heads right foot, s/p L TMA.  Hallux deformity.  Ankle deformity Limited range of motion on left with excessive supination.  No Pain with palpation to ulcerated area on left or right, has neuropathy. No pain with compression to calves bilateral.  No results for input(s): GRAMSTAIN, LABORGA in the last 8760  hours.  Assessment and Plan:  Problem List Items Addressed This Visit       Musculoskeletal and Integument   Ulcer of left foot, limited to breakdown of skin ([place]) - Primary   Other Visit Diagnoses     Ulcer of right foot, limited to breakdown of skin (HCC)       PAD (peripheral artery disease) (HCC)       Relevant Medications   amLODipine (NORVASC) 10 MG tablet   History of transmetatarsal amputation of left foot (HCC)       Diabetic polyneuropathy associated with type 2 diabetes mellitus (HCC)       Relevant Medications   busPIRone (BUSPAR) 7.5 MG tablet   Deaf mutism, acquired          -Re-Discussed again with patient progression of wounds and foot deformity  -Patient to see Aaron Edelman to see what shoe we can order that could work with his custom insoles or to start over for new brace for his deformity  - Excisionally dedbrided ulceration at right  plantar midfoot and left plantar TMA stump site and to the right great toe to healthy bleeding borders removing nonviable tissue using a sterile chisel blade. Wound measures post debridement as above.  Wound was debrided to the level of the dermis with viable wound base exposed to promote healing. Hemostasis was achieved with manuel pressure. Patient tolerated procedure well without any discomfort or anesthesia necessary for this wound debridement.  -Applied silver nitrate and medihoney and dry sterile dressing with home nursing to continue with alginate and dry dressing 2x per weeks -Encourage patient to continue with resting and decreasing the amount of time he spends ambulating to help with wound healing like before -Patient to return to office as scheduled or sooner if problems arise. At next visit will help to trim his toenails and fit him for shoes next visit on 3/22. Landis Martins, DPM

## 2021-11-22 ENCOUNTER — Telehealth: Payer: Self-pay | Admitting: *Deleted

## 2021-11-22 NOTE — Telephone Encounter (Signed)
Melissa called from [facility] yesterday and left a message wanting to get verbal orders from Dr Cannon Kettle stating that the nurse can continue wound care for 8 weeks and go see the patient 2 times a week and I called the nurse back and relayed the verbal orders per Dr Cannon Kettle. Lattie Haw ?

## 2021-11-24 ENCOUNTER — Other Ambulatory Visit: Payer: Self-pay | Admitting: Family

## 2021-11-24 ENCOUNTER — Other Ambulatory Visit: Payer: Self-pay | Admitting: Interventional Cardiology

## 2021-11-30 ENCOUNTER — Ambulatory Visit: Payer: Medicare Other | Admitting: Sports Medicine

## 2021-11-30 ENCOUNTER — Ambulatory Visit: Payer: Medicare Other

## 2021-12-07 ENCOUNTER — Ambulatory Visit (INDEPENDENT_AMBULATORY_CARE_PROVIDER_SITE_OTHER): Payer: Medicare Other | Admitting: Sports Medicine

## 2021-12-07 ENCOUNTER — Ambulatory Visit: Payer: Medicare Other

## 2021-12-07 ENCOUNTER — Encounter: Payer: Self-pay | Admitting: Sports Medicine

## 2021-12-07 ENCOUNTER — Other Ambulatory Visit: Payer: Self-pay

## 2021-12-07 DIAGNOSIS — H913 Deaf nonspeaking, not elsewhere classified: Secondary | ICD-10-CM

## 2021-12-07 DIAGNOSIS — L97521 Non-pressure chronic ulcer of other part of left foot limited to breakdown of skin: Secondary | ICD-10-CM | POA: Diagnosis not present

## 2021-12-07 DIAGNOSIS — E1142 Type 2 diabetes mellitus with diabetic polyneuropathy: Secondary | ICD-10-CM

## 2021-12-07 DIAGNOSIS — L97511 Non-pressure chronic ulcer of other part of right foot limited to breakdown of skin: Secondary | ICD-10-CM

## 2021-12-07 DIAGNOSIS — M14679 Charcot's joint, unspecified ankle and foot: Secondary | ICD-10-CM

## 2021-12-07 DIAGNOSIS — Z89432 Acquired absence of left foot: Secondary | ICD-10-CM

## 2021-12-07 DIAGNOSIS — I739 Peripheral vascular disease, unspecified: Secondary | ICD-10-CM

## 2021-12-07 NOTE — Progress Notes (Signed)
Subjective: ?Christian [name] is a 81 y.o. male patient seen in office for follow-up evaluation of left and right foot ulcer.  Patient is assisted by daughter who reports they are also here for nail trim on the right and for evaluation of his orthotics with Aaron Edelman. ? ?Patient Active Problem List  ? Diagnosis Date Noted  ? Decreased functional mobility and endurance 06/15/2021  ? Pressure injury of skin 03/15/2021  ? Wound cellulitis 03/12/2021  ? Acute-on-chronic kidney injury ([place]) 03/12/2021  ? Fall at home, initial encounter 09/14/2020  ? Stage 3a chronic kidney disease ([place]) 09/11/2020  ? Gastro-esophageal reflux disease with esophagitis 03/16/2020  ? Intractable vomiting with nausea 03/13/2020  ? Decreased activities of daily living (ADL) 02/26/2020  ? Dysphagia 02/26/2020  ? Impaired functional mobility, balance, gait, and endurance 02/26/2020  ? Acute respiratory failure with hypoxia ([place]) 10/27/2019  ? Sepsis ([place]) 10/21/2019  ? Generalized anxiety disorder 09/01/2019  ? Primary insomnia 09/01/2019  ? GERD without esophagitis 07/23/2019  ? Meningioma ([place]) 07/23/2019  ? 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 Ulcer of left foot, limited to breakdown of skin ([place]) 02/07/2018  ? Chronic pain of both shoulders 08/09/2017  ? Pain in right wrist 08/09/2017  ? Atherosclerosis of renal artery ([place]) 04/24/2017  ? Type 2 diabetes mellitus without complications ([place]) 95/62/1308  ? Chronic left-sided low back pain with left-sided sciatica 07/24/2016  ? Lower extremity edema 01/28/2015  ? Angina pectoris ([place]) 07/08/2014  ? Anemia, unspecified 05/06/2014  ? Acute gastric ulcer 08/27/2013  ? 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 IDA (iron deficiency anemia) 08/25/2013  ? Mixed hyperlipidemia 08/13/2013  ? Essential hypertension, benign 08/13/2013  ? ?Current Outpatient Medications on File Prior to Visit  ?Medication Sig Dispense Refill  ? ACCU-CHEK SMARTVIEW test strip USE 1  STRIP TO CHECK GLUCOSE TWICE DAILY AS DIRECTED    ? albuterol (VENTOLIN HFA) 108 (90 Base) MCG/ACT inhaler Inhale into the lungs.    ? amLODipine (NORVASC) 10 MG tablet Take 1 tablet (10 mg total) by mouth daily. 15 tablet 0  ? amLODipine (NORVASC) 10 MG tablet Take 1 tablet by mouth daily.    ? BREO ELLIPTA 200-25 MCG/ACT AEPB 1 puff daily.    ? busPIRone (BUSPAR) 7.5 MG tablet Take 7.5 mg by mouth 2 (two) times daily.    ? diclofenac (VOLTAREN) 50 MG EC tablet Take 50 mg by mouth 2 (two) times daily.    ? escitalopram (LEXAPRO) 5 MG tablet Take 5 mg by mouth daily.    ? furosemide (LASIX) 20 MG tablet Take 1 tablet (20 mg total) by mouth daily. 30 tablet 0  ? HYDROcodone-acetaminophen (NORCO/VICODIN) 5-325 MG tablet Take 1 tablet by mouth 3 (three) times daily as needed for moderate pain or severe pain. 15 tablet 0  ? isosorbide mononitrate (IMDUR) 60 MG 24 hr tablet Take 1 tablet (60 mg total) by mouth daily. Please make overdue appt with Dr. Irish Lack before anymore refills. Thank you 3rd and Final Attempt 15 tablet 0  ? ketoconazole (NIZORAL) 2 % cream Apply topically.    ? linezolid (ZYVOX) 600 MG tablet Take 1 tablet (600 mg total) by mouth 2 (two) times daily. 14 tablet 0  ? LORazepam (ATIVAN) 1 MG tablet Take 1 mg by mouth daily as needed for anxiety or sleep.    ? Multiple Vitamin (MULTIVITAMIN) capsule Take 1 capsule by mouth daily.    ? mupirocin cream (BACTROBAN)  2 % APPLY 1 APPLICATION  TOPICALLY TWICE DAILY 15 g 0  ? nitroGLYCERIN (NITROSTAT) 0.4 MG SL tablet Place 1 tablet (0.4 mg total) under the tongue every 5 (five) minutes as needed for chest pain. Please keep upcoming appt in May with Dr. Irish Lack. Thank you 75 tablet 0  ? polyethylene glycol (MIRALAX / GLYCOLAX) 17 g packet Take 17 g by mouth daily as needed for mild constipation. 14 each 0  ? polyethylene glycol powder (GLYCOLAX/MIRALAX) 17 GM/SCOOP powder Take by mouth.    ? pravastatin (PRAVACHOL) 40 MG tablet Take 40 mg by mouth daily.    ?  promethazine-dextromethorphan (PROMETHAZINE-DM) 6.25-15 MG/5ML syrup SMARTSIG:5-10 Milliliter(s) By Mouth Every 6 Hours PRN    ? solifenacin (VESICARE) 5 MG tablet Take 5 mg by mouth daily.    ? sulfamethoxazole-trimethoprim (BACTRIM) 400-80 MG tablet Take 1 tablet by mouth 2 (two) times daily. 28 tablet 0  ? tamsulosin (FLOMAX) 0.4 MG CAPS capsule Take 0.4 mg by mouth in the morning and at bedtime.    ? tiZANidine (ZANAFLEX) 4 MG tablet Take by mouth.    ? torsemide (DEMADEX) 20 MG tablet Take by mouth.    ? [DISCONTINUED] pantoprazole (PROTONIX) 40 MG tablet Take 1 tablet (40 mg total) by mouth daily. (Patient not taking: Reported on 03/12/2021) 90 tablet 2  ? ?Current Facility-Administered Medications on File Prior to Visit  ?Medication Dose Route Frequency Provider Last Rate Last Admin  ? cadexomer iodine (IODOSORB) 0.9 % gel   Topical Once per day on Mon Wed Fri [name], MD      ? ?Allergies  ?Allergen Reactions  ? Lipitor [Atorvastatin] Other (See Comments)  ?  unknown  ? ? ?Recent Results (from the past 2160 hour(s))  ?WOUND CULTURE     Status: Abnormal  ? Collection Time: 10/14/21  3:14 PM  ? Specimen: Foot, Left; Wound  ? Wound Culture and sens  ?Result Value Ref Range  ? Gram Stain Result Final report   ? Organism ID, Bacteria Comment   ?  Comment: Rare white blood cells.  ? Organism ID, Bacteria Comment   ?  Comment: Many gram positive cocci.  ? Aerobic Bacterial Culture Final report (A)   ? Organism ID, Bacteria Comment (A)   ?  Comment: Methicillin - resistant Staphylococcus aureus ?Based on resistance to oxacillin this isolate would be resistant to ?all currently available beta-lactam antimicrobial agents, with the ?exception of the newer cephalosporins with anti-MRSA activity, such as ?Ceftaroline ?Heavy growth ?  ? Organism ID, Bacteria Routine flora   ?  Comment: Moderate growth  ? Antimicrobial Susceptibility Comment   ?  Comment:       ** S = Susceptible; I = Intermediate; R = Resistant ** ?                    P = Positive; N = Negative ?            MICS are expressed in micrograms per mL ?   Antibiotic                 RSLT#1    RSLT#2    RSLT#3    RSLT#4 ?Ciprofloxacin                  S ?Clindamycin                    R ?Erythromycin  R ?Gentamicin                     S ?Levofloxacin                   S ?Linezolid                      S ?Oxacillin                      R ?Penicillin                     R ?Rifampin                       S ?Tetracycline                   R ?Trimethoprim/Sulfa             S ?Vancomycin                     S ?  ? ? ? ? ?Objective: ?There were no vitals filed for this visit. ? ?General: Patient is awake, alert, oriented x 3 and in no acute distress. ? ?Dermatology: Skin is warm and dry bilateral with a partial thickness ulceration present plantar left foot TMA stump site . Ulceration measures 0.8 cm x 0.5 cm x 0.2 cm. There is a keratotic border with a granular base with significant hemorrhagic blistering, the ulceration does not probe to bone. There is no malodor, no active drainage, no erythema, +1 edema. No acute signs of infection. ? ?Right plantar foot there is a partial thickness ulceration and the plantar lateral midfoot with keratotic border with a granular base, the ulceration measures 2 x 1 cm larger than before, the ulceration does not probe to bone. There is no malodor, no active drainage, no erythema.  ?   ?Vascular: Dorsalis Pedis pulse = 1/4 Bilateral,  Posterior Tibial pulse = 0/4 Bilateral,  Capillary Fill Time < 5 seconds on right, +1 pitting edema bilateral. ? ?Neurologic: Protective sensation absent bilateral.. ? ?Musculosketal: Prominent metatarsal heads right foot, s/p L TMA.  Hallux deformity.  Ankle deformity Limited range of motion on left with excessive supination.  Charcot foot deformity.  No Pain with palpation to ulcerated area on left or right, has neuropathy. No pain with compression to calves bilateral. ? ?No results for  input(s): GRAMSTAIN, LABORGA in the last 8760 hours. ? ?Assessment and Plan:  ?Problem List Items Addressed This Visit   ? ?  ? Musculoskeletal and Integument  ? Ulcer of left foot, limited to breakdown of

## 2021-12-07 NOTE — Progress Notes (Signed)
SITUATION ?Reason for Consult: Evaluation for Prefabricated Diabetic Shoes and Custom Diabetic Inserts. ?Patient / Caregiver Report: Patient would like well fitting shoes ? ?OBJECTIVE DATA: ?Patient History / Diagnosis:  ?  ICD-10-CM   ?1. Diabetic polyneuropathy associated with type 2 diabetes mellitus ([place])  E11.42   ?  ?2. History of transmetatarsal amputation of left foot ([place])  V89.381   ?  ?3. Charcot's joint of ankle, unspecified laterality  M14.679   ?  ?4. Ulcer of left foot, limited to breakdown of skin ([place])  L97.521   ?  ?5. Ulcer of right foot, limited to breakdown of skin ([place])  L97.511   ?  ? ? ?Physician Treating Diabetes:  Dolphus Jenny. Turner, DO ? ?Current or Previous Devices:   Historical from [employer] ? ?In-Person Foot Examination: ?Ulcers & Callousing:   Bilateral metatarsals ?Deformities:    Charcot ankle, transmet left ?Sensation:    Compromised  ?Shoe Size:     10W ? ?ORTHOTIC RECOMMENDATION ?Recommended Devices: ?- 1x pair prefabricated PDAC approved diabetic shoes; Patient Selected [id number] Size 10W ?- 3x pair custom-to-patient PDAC approved vacuum formed diabetic insoles. ? ?GOALS OF SHOES AND INSOLES ?- Reduce shear and pressure ?- Reduce / Prevent callus formation ?- Reduce / Prevent ulceration ?- Protect the fragile healing compromised diabetic foot. ? ?Patient would benefit from diabetic shoes and inserts as patient has diabetes mellitus and the patient has one or more of the following conditions: ?- History of partial or complete amputation of the foot ?- History of previous foot ulceration. ?- History of pre-ulcerative callus ?- Peripheral neuropathy with evidence of callus formation ?- Foot deformity ?- Poor circulation ? ?ACTIONS PERFORMED ?Potential out of pocket cost was communicated to patient. Patient understood and consented to measurement and casting. Patient was casted for insoles via crush box and measured for shoes via brannock device. Procedure was explained and patient  tolerated procedure well. All questions were answered and concerns addressed. Casts were shipped to central fabrication for HOLD until Certificate of Medical Necessity or otherwise necessary authorization from insurance is obtained. ? ?PLAN ?Shoes are to be ordered and casts released from hold once all appropriate paperwork is complete. Patient is to be contacted and scheduled for fitting once shoes and insoles have been fabricated and received. ? ?

## 2021-12-08 ENCOUNTER — Telehealth: Payer: Self-pay | Admitting: *Deleted

## 2021-12-08 NOTE — Telephone Encounter (Signed)
Charlene w/ [facility] is calling for any updated orders for patient.  ?I faxed the last office visit notes from 12/07/21. ?

## 2021-12-08 NOTE — Telephone Encounter (Signed)
Faxed last office visit notes to [facility]  [facility],received confirmation 12/08/21

## 2021-12-23 ENCOUNTER — Other Ambulatory Visit: Payer: Self-pay | Admitting: Sports Medicine

## 2021-12-27 ENCOUNTER — Ambulatory Visit: Payer: Medicare Other | Admitting: Sports Medicine

## 2021-12-27 ENCOUNTER — Encounter: Payer: Self-pay | Admitting: Sports Medicine

## 2021-12-27 DIAGNOSIS — L97521 Non-pressure chronic ulcer of other part of left foot limited to breakdown of skin: Secondary | ICD-10-CM

## 2021-12-27 DIAGNOSIS — L97511 Non-pressure chronic ulcer of other part of right foot limited to breakdown of skin: Secondary | ICD-10-CM

## 2021-12-27 DIAGNOSIS — E1142 Type 2 diabetes mellitus with diabetic polyneuropathy: Secondary | ICD-10-CM

## 2021-12-27 DIAGNOSIS — H913 Deaf nonspeaking, not elsewhere classified: Secondary | ICD-10-CM

## 2021-12-27 DIAGNOSIS — I739 Peripheral vascular disease, unspecified: Secondary | ICD-10-CM

## 2021-12-27 DIAGNOSIS — M14679 Charcot's joint, unspecified ankle and foot: Secondary | ICD-10-CM

## 2021-12-27 DIAGNOSIS — Z89432 Acquired absence of left foot: Secondary | ICD-10-CM

## 2021-12-27 DIAGNOSIS — R269 Unspecified abnormalities of gait and mobility: Secondary | ICD-10-CM

## 2021-12-27 NOTE — Progress Notes (Signed)
Subjective: ?Christian [name] is a 81 y.o. male patient seen in office for follow-up evaluation of left and right foot ulcers.  Patient is assisted by daughter who reports an interpreter this visit.  Patient reports that he is doing good no problems feels like the wounds are getting smaller he did work with his home nurse to further offload his insole on the left and he thinks that has been helping. ? ?Patient Active Problem List  ? Diagnosis Date Noted  ? Decreased functional mobility and endurance 06/15/2021  ? Pressure injury of skin 03/15/2021  ? Wound cellulitis 03/12/2021  ? Acute-on-chronic kidney injury ([place]) 03/12/2021  ? Fall at home, initial encounter 09/14/2020  ? Stage 3a chronic kidney disease ([place]) 09/11/2020  ? Gastro-esophageal reflux disease with esophagitis 03/16/2020  ? Intractable vomiting with nausea 03/13/2020  ? Decreased activities of daily living (ADL) 02/26/2020  ? Dysphagia 02/26/2020  ? Impaired functional mobility, balance, gait, and endurance 02/26/2020  ? Acute respiratory failure with hypoxia ([place]) 10/27/2019  ? Sepsis ([place]) 10/21/2019  ? Generalized anxiety disorder 09/01/2019  ? Primary insomnia 09/01/2019  ? GERD without esophagitis 07/23/2019  ? Meningioma ([place]) 07/23/2019  ? 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 Ulcer of left foot, limited to breakdown of skin ([place]) 02/07/2018  ? Chronic pain of both shoulders 08/09/2017  ? Pain in right wrist 08/09/2017  ? Atherosclerosis of renal artery ([place]) 04/24/2017  ? Type 2 diabetes mellitus without complications ([place]) 16/06/9603  ? Chronic left-sided low back pain with left-sided sciatica 07/24/2016  ? Lower extremity edema 01/28/2015  ? Angina pectoris ([place]) 07/08/2014  ? Anemia, unspecified 05/06/2014  ? Acute gastric ulcer 08/27/2013  ? 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 IDA (iron deficiency anemia) 08/25/2013  ? Mixed hyperlipidemia 08/13/2013  ? Essential hypertension, benign  08/13/2013  ? ?Current Outpatient Medications on File Prior to Visit  ?Medication Sig Dispense Refill  ? ACCU-CHEK SMARTVIEW test strip USE 1 STRIP TO CHECK GLUCOSE TWICE DAILY AS DIRECTED    ? albuterol (VENTOLIN HFA) 108 (90 Base) MCG/ACT inhaler Inhale into the lungs.    ? amLODipine (NORVASC) 10 MG tablet Take 1 tablet (10 mg total) by mouth daily. 15 tablet 0  ? amLODipine (NORVASC) 10 MG tablet Take 1 tablet by mouth daily.    ? BREO ELLIPTA 200-25 MCG/ACT AEPB 1 puff daily.    ? busPIRone (BUSPAR) 7.5 MG tablet Take 7.5 mg by mouth 2 (two) times daily.    ? diclofenac (VOLTAREN) 50 MG EC tablet Take 50 mg by mouth 2 (two) times daily.    ? escitalopram (LEXAPRO) 5 MG tablet Take 5 mg by mouth daily.    ? furosemide (LASIX) 20 MG tablet Take 1 tablet (20 mg total) by mouth daily. 30 tablet 0  ? HYDROcodone-acetaminophen (NORCO/VICODIN) 5-325 MG tablet Take 1 tablet by mouth 3 (three) times daily as needed for moderate pain or severe pain. 15 tablet 0  ? isosorbide mononitrate (IMDUR) 60 MG 24 hr tablet Take 1 tablet (60 mg total) by mouth daily. Please make overdue appt with Dr. Irish Lack before anymore refills. Thank you 3rd and Final Attempt 15 tablet 0  ? ketoconazole (NIZORAL) 2 % cream Apply topically.    ? linezolid (ZYVOX) 600 MG tablet Take 1 tablet (600 mg total) by mouth 2 (two) times daily. 14 tablet 0  ? LORazepam (ATIVAN) 1 MG tablet Take 1 mg by mouth daily as needed  for anxiety or sleep.    ? Multiple Vitamin (MULTIVITAMIN) capsule Take 1 capsule by mouth daily.    ? mupirocin cream (BACTROBAN) 2 % APPLY 1 APPLICATION  TOPICALLY TWICE DAILY 15 g 0  ? nitroGLYCERIN (NITROSTAT) 0.4 MG SL tablet Place 1 tablet (0.4 mg total) under the tongue every 5 (five) minutes as needed for chest pain. Please keep upcoming appt in May with Dr. Irish Lack. Thank you 75 tablet 0  ? polyethylene glycol (MIRALAX / GLYCOLAX) 17 g packet Take 17 g by mouth daily as needed for mild constipation. 14 each 0  ? polyethylene  glycol powder (GLYCOLAX/MIRALAX) 17 GM/SCOOP powder Take by mouth.    ? pravastatin (PRAVACHOL) 40 MG tablet Take 40 mg by mouth daily.    ? promethazine-dextromethorphan (PROMETHAZINE-DM) 6.25-15 MG/5ML syrup SMARTSIG:5-10 Milliliter(s) By Mouth Every 6 Hours PRN    ? solifenacin (VESICARE) 5 MG tablet Take 5 mg by mouth daily.    ? sulfamethoxazole-trimethoprim (BACTRIM) 400-80 MG tablet Take 1 tablet by mouth 2 (two) times daily. 28 tablet 0  ? tamsulosin (FLOMAX) 0.4 MG CAPS capsule Take 0.4 mg by mouth in the morning and at bedtime.    ? tiZANidine (ZANAFLEX) 4 MG tablet Take by mouth.    ? torsemide (DEMADEX) 20 MG tablet Take by mouth.    ? [DISCONTINUED] pantoprazole (PROTONIX) 40 MG tablet Take 1 tablet (40 mg total) by mouth daily. (Patient not taking: Reported on 03/12/2021) 90 tablet 2  ? ?Current Facility-Administered Medications on File Prior to Visit  ?Medication Dose Route Frequency Provider Last Rate Last Admin  ? cadexomer iodine (IODOSORB) 0.9 % gel   Topical Once per day on Mon Wed Fri [name], MD      ? ?Allergies  ?Allergen Reactions  ? Lipitor [Atorvastatin] Other (See Comments)  ?  unknown  ? ? ?Recent Results (from the past 2160 hour(s))  ?WOUND CULTURE     Status: Abnormal  ? Collection Time: 10/14/21  3:14 PM  ? Specimen: Foot, Left; Wound  ? Wound Culture and sens  ?Result Value Ref Range  ? Gram Stain Result Final report   ? Organism ID, Bacteria Comment   ?  Comment: Rare white blood cells.  ? Organism ID, Bacteria Comment   ?  Comment: Many gram positive cocci.  ? Aerobic Bacterial Culture Final report (A)   ? Organism ID, Bacteria Comment (A)   ?  Comment: Methicillin - resistant Staphylococcus aureus ?Based on resistance to oxacillin this isolate would be resistant to ?all currently available beta-lactam antimicrobial agents, with the ?exception of the newer cephalosporins with anti-MRSA activity, such as ?Ceftaroline ?Heavy growth ?  ? Organism ID, Bacteria Routine flora   ?   Comment: Moderate growth  ? Antimicrobial Susceptibility Comment   ?  Comment:       ** S = Susceptible; I = Intermediate; R = Resistant ** ?                   P = Positive; N = Negative ?            MICS are expressed in micrograms per mL ?   Antibiotic                 RSLT#1    RSLT#2    RSLT#3    RSLT#4 ?Ciprofloxacin                  S ?Clindamycin  R ?Erythromycin                   R ?Gentamicin                     S ?Levofloxacin                   S ?Linezolid                      S ?Oxacillin                      R ?Penicillin                     R ?Rifampin                       S ?Tetracycline                   R ?Trimethoprim/Sulfa             S ?Vancomycin                     S ?  ? ? ? ? ?Objective: ?There were no vitals filed for this visit. ? ?General: Patient is awake, alert, oriented x 3 and in no acute distress. ? ?Dermatology: Skin is warm and dry bilateral with a partial thickness ulceration present plantar left foot TMA stump site . Ulceration measures 0.5 cm x 0.5 cm x 0.2 cm. There is a keratotic border with a granular base with significant hemorrhagic blistering, the ulceration does not probe to bone. There is no malodor, no active drainage, no erythema, +1 edema. No acute signs of infection. ? ?Right plantar foot there is a partial thickness ulceration and the plantar lateral midfoot with keratotic border with a granular base, the ulceration measures 0.8 x 0.5 smaller than before, the ulceration does not probe to bone. There is no malodor, no active drainage, no erythema.  ?   ?Vascular: Dorsalis Pedis pulse = 1/4 Bilateral,  Posterior Tibial pulse = 0/4 Bilateral,  Capillary Fill Time < 5 seconds on right, +1 pitting edema bilateral. ? ?Neurologic: Protective sensation absent bilateral.  History of neuropathy. ? ?Musculosketal: Prominent metatarsal heads right foot, s/p L TMA.  Hallux deformity.  Ankle deformity Limited range of motion on left with excessive supination.   Charcot foot deformity.  No Pain with palpation to ulcerated area on left or right, has neuropathy. No pain with compression to calves bilateral. ? ?No results for input(s): GRAMSTAIN, LABORGA in the last 87

## 2022-01-11 ENCOUNTER — Other Ambulatory Visit: Payer: Self-pay | Admitting: Sports Medicine

## 2022-01-17 ENCOUNTER — Encounter: Payer: Self-pay | Admitting: Sports Medicine

## 2022-01-17 ENCOUNTER — Ambulatory Visit: Payer: Medicare Other | Admitting: Sports Medicine

## 2022-01-17 DIAGNOSIS — E1142 Type 2 diabetes mellitus with diabetic polyneuropathy: Secondary | ICD-10-CM

## 2022-01-17 DIAGNOSIS — M14679 Charcot's joint, unspecified ankle and foot: Secondary | ICD-10-CM

## 2022-01-17 DIAGNOSIS — L97521 Non-pressure chronic ulcer of other part of left foot limited to breakdown of skin: Secondary | ICD-10-CM

## 2022-01-17 DIAGNOSIS — L97511 Non-pressure chronic ulcer of other part of right foot limited to breakdown of skin: Secondary | ICD-10-CM

## 2022-01-17 DIAGNOSIS — I739 Peripheral vascular disease, unspecified: Secondary | ICD-10-CM

## 2022-01-17 DIAGNOSIS — Z89432 Acquired absence of left foot: Secondary | ICD-10-CM

## 2022-01-17 NOTE — Progress Notes (Signed)
Subjective: ?Christian [name] is a 81 y.o. male patient seen in office for follow-up evaluation of left and right foot ulcers.  Patient reports that he is doing better and states that he did not even wrap the right foot.  Patient is wondering if his shoes are in.  Patient is assisted by daughter and sign language interpreter this visit.  ? ?Patient Active Problem List  ? Diagnosis Date Noted  ? Decreased functional mobility and endurance 06/15/2021  ? Pressure injury of skin 03/15/2021  ? Wound cellulitis 03/12/2021  ? Acute-on-chronic kidney injury ([place]) 03/12/2021  ? Fall at home, initial encounter 09/14/2020  ? Stage 3a chronic kidney disease ([place]) 09/11/2020  ? Gastro-esophageal reflux disease with esophagitis 03/16/2020  ? Intractable vomiting with nausea 03/13/2020  ? Decreased activities of daily living (ADL) 02/26/2020  ? Dysphagia 02/26/2020  ? Impaired functional mobility, balance, gait, and endurance 02/26/2020  ? Acute respiratory failure with hypoxia ([place]) 10/27/2019  ? Sepsis ([place]) 10/21/2019  ? Generalized anxiety disorder 09/01/2019  ? Primary insomnia 09/01/2019  ? GERD without esophagitis 07/23/2019  ? Meningioma ([place]) 07/23/2019  ? 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 Ulcer of left foot, limited to breakdown of skin ([place]) 02/07/2018  ? Chronic pain of both shoulders 08/09/2017  ? Pain in right wrist 08/09/2017  ? Atherosclerosis of renal artery ([place]) 04/24/2017  ? Type 2 diabetes mellitus without complications ([place]) 53/97/6734  ? Chronic left-sided low back pain with left-sided sciatica 07/24/2016  ? Lower extremity edema 01/28/2015  ? Angina pectoris ([place]) 07/08/2014  ? Anemia, unspecified 05/06/2014  ? Acute gastric ulcer 08/27/2013  ? 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 IDA (iron deficiency anemia) 08/25/2013  ? Mixed hyperlipidemia 08/13/2013  ? Essential hypertension, benign 08/13/2013  ? ?Current Outpatient Medications on File Prior to Visit   ?Medication Sig Dispense Refill  ? ACCU-CHEK SMARTVIEW test strip USE 1 STRIP TO CHECK GLUCOSE TWICE DAILY AS DIRECTED    ? albuterol (VENTOLIN HFA) 108 (90 Base) MCG/ACT inhaler Inhale into the lungs.    ? amLODipine (NORVASC) 10 MG tablet Take 1 tablet (10 mg total) by mouth daily. 15 tablet 0  ? amLODipine (NORVASC) 10 MG tablet Take 1 tablet by mouth daily.    ? BREO ELLIPTA 200-25 MCG/ACT AEPB 1 puff daily.    ? busPIRone (BUSPAR) 7.5 MG tablet Take 7.5 mg by mouth 2 (two) times daily.    ? diclofenac (VOLTAREN) 50 MG EC tablet Take 50 mg by mouth 2 (two) times daily.    ? escitalopram (LEXAPRO) 5 MG tablet Take 5 mg by mouth daily.    ? furosemide (LASIX) 20 MG tablet TAKE 1 TABLET BY MOUTH EVERY DAY 90 tablet 1  ? HYDROcodone-acetaminophen (NORCO/VICODIN) 5-325 MG tablet Take 1 tablet by mouth 3 (three) times daily as needed for moderate pain or severe pain. 15 tablet 0  ? isosorbide mononitrate (IMDUR) 60 MG 24 hr tablet Take 1 tablet (60 mg total) by mouth daily. Please make overdue appt with Dr. Irish Lack before anymore refills. Thank you 3rd and Final Attempt 15 tablet 0  ? ketoconazole (NIZORAL) 2 % cream Apply topically.    ? linezolid (ZYVOX) 600 MG tablet Take 1 tablet (600 mg total) by mouth 2 (two) times daily. 14 tablet 0  ? LORazepam (ATIVAN) 1 MG tablet Take 1 mg by mouth daily as needed for anxiety or sleep.    ? Multiple Vitamin (MULTIVITAMIN) capsule  Take 1 capsule by mouth daily.    ? mupirocin cream (BACTROBAN) 2 % APPLY 1 APPLICATION  TOPICALLY TWICE DAILY 15 g 0  ? nitroGLYCERIN (NITROSTAT) 0.4 MG SL tablet Place 1 tablet (0.4 mg total) under the tongue every 5 (five) minutes as needed for chest pain. Please keep upcoming appt in May with Dr. Irish Lack. Thank you 75 tablet 0  ? polyethylene glycol (MIRALAX / GLYCOLAX) 17 g packet Take 17 g by mouth daily as needed for mild constipation. 14 each 0  ? polyethylene glycol powder (GLYCOLAX/MIRALAX) 17 GM/SCOOP powder Take by mouth.    ?  pravastatin (PRAVACHOL) 40 MG tablet Take 40 mg by mouth daily.    ? promethazine-dextromethorphan (PROMETHAZINE-DM) 6.25-15 MG/5ML syrup SMARTSIG:5-10 Milliliter(s) By Mouth Every 6 Hours PRN    ? solifenacin (VESICARE) 5 MG tablet Take 5 mg by mouth daily.    ? sulfamethoxazole-trimethoprim (BACTRIM) 400-80 MG tablet Take 1 tablet by mouth 2 (two) times daily. 28 tablet 0  ? tamsulosin (FLOMAX) 0.4 MG CAPS capsule Take 0.4 mg by mouth in the morning and at bedtime.    ? tiZANidine (ZANAFLEX) 4 MG tablet Take by mouth.    ? torsemide (DEMADEX) 20 MG tablet Take by mouth.    ? [DISCONTINUED] pantoprazole (PROTONIX) 40 MG tablet Take 1 tablet (40 mg total) by mouth daily. (Patient not taking: Reported on 03/12/2021) 90 tablet 2  ? ?Current Facility-Administered Medications on File Prior to Visit  ?Medication Dose Route Frequency Provider Last Rate Last Admin  ? cadexomer iodine (IODOSORB) 0.9 % gel   Topical Once per day on Mon Wed Fri [name], MD      ? ?Allergies  ?Allergen Reactions  ? Lipitor [Atorvastatin] Other (See Comments)  ?  unknown  ? ? ?No results found for this or any previous visit (from the past 2160 hour(s)). ? ? ? ? ?Objective: ?There were no vitals filed for this visit. ? ?General: Patient is awake, alert, oriented x 3 and in no acute distress. ? ?Dermatology: Skin is warm and dry bilateral with a now scabbed over wound at the left foot plantar TMA stump site.  There is no underlying ulceration silver nitrate medicine is present to the wound.  There is no malodor, no active drainage, no erythema, +trace edema. No acute signs of infection. ? ?Right plantar foot there is a partial thickness ulceration and the plantar lateral midfoot with keratotic border with a granular base, the ulceration measures 0.5x 0.5 cm smaller than before, the ulceration does not probe to bone. There is no malodor, no active drainage, no erythema.  ?   ?Vascular: Dorsalis Pedis pulse = 1/4 Bilateral,  Posterior Tibial  pulse = 0/4 Bilateral,  Capillary Fill Time < 5 seconds on right, +1 pitting edema bilateral. ? ?Neurologic: Protective sensation absent bilateral.  History of neuropathy. ? ?Musculosketal: Prominent metatarsal heads right foot, s/p L TMA.  Hallux deformity.  Ankle deformity Limited range of motion on left with excessive supination.  Charcot foot deformity.  No Pain with palpation to ulcerated area on left or right, has neuropathy. No pain with compression to calves bilateral. ? ?No results for input(s): GRAMSTAIN, LABORGA in the last 8760 hours. ? ?Assessment and Plan:  ?Problem List Items Addressed This Visit   ? ?  ? Musculoskeletal and Integument  ? Ulcer of left foot, limited to breakdown of skin ([place]) - Primary  ? ?Other Visit Diagnoses   ? ? Ulcer of right foot, limited  to breakdown of skin ([place])      ? Diabetic polyneuropathy associated with type 2 diabetes mellitus ([place])      ? 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 Charcot's joint of ankle, unspecified laterality      ? PAD (peripheral artery disease) ([place])      ? ?  ? ?-Re-Discussed again with patient wound care which appears to be improving ?- Excisionally dedbrided ulceration at right plantar midfoot and left plantar TMA stump site to healthy bleeding borders removing nonviable tissue using a sterile chisel blade, there is a of note no underlying opening on the left foot at today's visit. Wound measures post debridement as above.  Wound was debrided to the level of the dermis with viable wound base exposed to promote healing. Hemostasis was achieved with manuel pressure. Patient tolerated procedure well without any discomfort or anesthesia necessary for this wound debridement.  ?-Applied silver nitrate and prism and dry sterile dressing bilateral Home nursing to continue with silver alginate or Iodosorb to these areas ?-Encourage patient to continue with offloading insoles until he can get his custom insoles shoes that were  ordered on 3/29 with Aaron Edelman; added some additional offloading padding to the right insole at today's visit ?-Patient to return to office as scheduled or sooner if problems arise. ?Landis Martins, DPM ?

## 2022-02-13 ENCOUNTER — Ambulatory Visit: Payer: Medicare Other | Admitting: Podiatry

## 2022-02-13 ENCOUNTER — Encounter: Payer: Self-pay | Admitting: Podiatry

## 2022-02-13 DIAGNOSIS — L97511 Non-pressure chronic ulcer of other part of right foot limited to breakdown of skin: Secondary | ICD-10-CM

## 2022-02-13 DIAGNOSIS — I739 Peripheral vascular disease, unspecified: Secondary | ICD-10-CM

## 2022-02-13 DIAGNOSIS — M14679 Charcot's joint, unspecified ankle and foot: Secondary | ICD-10-CM

## 2022-02-13 DIAGNOSIS — Z89432 Acquired absence of left foot: Secondary | ICD-10-CM

## 2022-02-13 DIAGNOSIS — E1142 Type 2 diabetes mellitus with diabetic polyneuropathy: Secondary | ICD-10-CM

## 2022-02-13 NOTE — Progress Notes (Signed)
Subjective: Christian [name] is a 81 y.o. male patient seen in office for follow-up evaluation of left and right foot ulcers.  Patient reports that he is doing better  Patient is wondering if his shoes are in.  Patient is assisted by daughter and sign language interpreter this visit.   Patient Active Problem List   Diagnosis Date Noted   Decreased functional mobility and endurance 06/15/2021   Pressure injury of skin 03/15/2021   Wound cellulitis 03/12/2021   Acute-on-chronic kidney injury ([place]) 03/12/2021   Fall at home, initial encounter 09/14/2020   Stage 3a chronic kidney disease ([place]) 09/11/2020   Gastro-esophageal reflux disease with esophagitis 03/16/2020   Intractable vomiting with nausea 03/13/2020   Decreased activities of daily living (ADL) 02/26/2020   Dysphagia 02/26/2020   Impaired functional mobility, balance, gait, and endurance 02/26/2020   Acute respiratory failure with hypoxia ([facility]) 10/27/2019   Sepsis ([place]) 10/21/2019   Generalized anxiety disorder 09/01/2019   Primary insomnia 09/01/2019   GERD without esophagitis 07/23/2019   Meningioma ([place]) 07/23/2019   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   Ulcer of left foot, limited to breakdown of skin ([place]) 02/07/2018   Chronic pain of both shoulders 08/09/2017   Pain in right wrist 08/09/2017   Atherosclerosis of renal artery ([place]) 04/24/2017   Type 2 diabetes mellitus without complications ([place]) 01/60/1093   Chronic left-sided low back pain with left-sided sciatica 07/24/2016   Lower extremity edema 01/28/2015   Angina pectoris ([place]) 07/08/2014   Anemia, unspecified 05/06/2014   Acute gastric ulcer 08/27/2013   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   IDA (iron deficiency anemia) 08/25/2013   Mixed hyperlipidemia 08/13/2013   Essential hypertension, benign 08/13/2013   Current Outpatient Medications on File Prior to Visit  Medication Sig Dispense Refill   ACCU-CHEK  SMARTVIEW test strip USE 1 STRIP TO CHECK GLUCOSE TWICE DAILY AS DIRECTED     albuterol (VENTOLIN HFA) 108 (90 Base) MCG/ACT inhaler Inhale into the lungs.     amLODipine (NORVASC) 10 MG tablet Take 1 tablet (10 mg total) by mouth daily. 15 tablet 0   amLODipine (NORVASC) 10 MG tablet Take 1 tablet by mouth daily.     BREO ELLIPTA 200-25 MCG/ACT AEPB 1 puff daily.     busPIRone (BUSPAR) 7.5 MG tablet Take 7.5 mg by mouth 2 (two) times daily.     diclofenac (VOLTAREN) 50 MG EC tablet Take 1 tablet by mouth 2 (two) times daily as needed.     escitalopram (LEXAPRO) 5 MG tablet Take 5 mg by mouth daily.     furosemide (LASIX) 20 MG tablet TAKE 1 TABLET BY MOUTH EVERY DAY 90 tablet 1   HYDROcodone-acetaminophen (NORCO/VICODIN) 5-325 MG tablet Take 1 tablet by mouth 3 (three) times daily as needed for moderate pain or severe pain. 15 tablet 0   isosorbide mononitrate (IMDUR) 60 MG 24 hr tablet Take 1 tablet (60 mg total) by mouth daily. Please make overdue appt with Dr. Irish Lack before anymore refills. Thank you 3rd and Final Attempt 15 tablet 0   ketoconazole (NIZORAL) 2 % cream Apply topically.     linezolid (ZYVOX) 600 MG tablet Take 1 tablet (600 mg total) by mouth 2 (two) times daily. 14 tablet 0   LORazepam (ATIVAN) 1 MG tablet Take 1 mg by mouth daily as needed for anxiety or sleep.     Multiple Vitamin (MULTIVITAMIN) capsule Take 1 capsule by mouth daily.  mupirocin cream (BACTROBAN) 2 % APPLY 1 APPLICATION  TOPICALLY TWICE DAILY 15 g 0   nitroGLYCERIN (NITROSTAT) 0.4 MG SL tablet Place 1 tablet (0.4 mg total) under the tongue every 5 (five) minutes as needed for chest pain. Please keep upcoming appt in May with Dr. Irish Lack. Thank you 75 tablet 0   polyethylene glycol (MIRALAX / GLYCOLAX) 17 g packet Take 17 g by mouth daily as needed for mild constipation. 14 each 0   polyethylene glycol powder (GLYCOLAX/MIRALAX) 17 GM/SCOOP powder Take by mouth.     pravastatin (PRAVACHOL) 40 MG tablet  Take 40 mg by mouth daily.     promethazine-dextromethorphan (PROMETHAZINE-DM) 6.25-15 MG/5ML syrup SMARTSIG:5-10 Milliliter(s) By Mouth Every 6 Hours PRN     solifenacin (VESICARE) 5 MG tablet Take 5 mg by mouth daily.     sulfamethoxazole-trimethoprim (BACTRIM) 400-80 MG tablet Take 1 tablet by mouth 2 (two) times daily. 28 tablet 0   tamsulosin (FLOMAX) 0.4 MG CAPS capsule Take 0.4 mg by mouth in the morning and at bedtime.     tiZANidine (ZANAFLEX) 4 MG tablet Take by mouth.     torsemide (DEMADEX) 20 MG tablet Take by mouth.     [DISCONTINUED] pantoprazole (PROTONIX) 40 MG tablet Take 1 tablet (40 mg total) by mouth daily. (Patient not taking: Reported on 03/12/2021) 90 tablet 2   Current Facility-Administered Medications on File Prior to Visit  Medication Dose Route Frequency Provider Last Rate Last Admin   cadexomer iodine (IODOSORB) 0.9 % gel   Topical Once per day on Mon Wed Fri Duda, Marcus V, MD       Allergies  Allergen Reactions   Lipitor [Atorvastatin] Other (See Comments)    unknown    No results found for this or any previous visit (from the past 2160 hour(s)).     Objective: There were no vitals filed for this visit.  General: Patient is awake, alert, oriented x 3 and in no acute distress.  Dermatology: Skin is warm and dry bilateral with a now scabbed over wound at the left foot plantar TMA stump site.  There is no underlying ulceration silver nitrate medicine is present to the wound.  There is no malodor, no active drainage, no erythema, +trace edema. No acute signs of infection.  Right plantar foot there is a partial thickness ulceration and the plantar lateral midfoot with keratotic border with a granular base, the ulceration measures 0.2x 0.2 cm smaller than before, the ulceration does not probe to bone. There is no malodor, no active drainage, no erythema.     Vascular: Dorsalis Pedis pulse = 1/4 Bilateral,  Posterior Tibial pulse = 0/4 Bilateral,  Capillary Fill  Time < 5 seconds on right, +1 pitting edema bilateral.  Neurologic: Protective sensation absent bilateral.  History of neuropathy.  Musculosketal: Prominent metatarsal heads right foot, s/p L TMA.  Hallux deformity.  Ankle deformity Limited range of motion on left with excessive supination.  Charcot foot deformity.  No Pain with palpation to ulcerated area on left or right, has neuropathy. No pain with compression to calves bilateral.  No results for input(s): GRAMSTAIN, LABORGA in the last 8760 hours.  Assessment and Plan:  Problem List Items Addressed This Visit   None   -Re-Discussed again with patient wound care which appears to be improving - Excisionally dedbrided ulceration at right plantar midfoot and left plantar TMA stump site to healthy bleeding borders removing nonviable tissue using a sterile chisel blade, there is a of note  no underlying opening on the left foot at today's visit. Wound measures post debridement as above.  Wound was debrided to the level of the dermis with viable wound base exposed to promote healing. Hemostasis was achieved with manuel pressure. Patient tolerated procedure well without any discomfort or anesthesia necessary for this wound debridement.  -Applied silver nitrate and prism and dry sterile dressing bilateral Home nursing to continue with silver alginate or Iodosorb to these areas -Encourage patient to continue with offloading insoles until he can get his custom insoles shoes that were ordered on 3/29 with Aaron Edelman; added some additional offloading padding to the right insole at today's visit -Patient to return to office as scheduled or sooner if problems arise. Lorenda Peck,  DPM

## 2022-02-27 ENCOUNTER — Other Ambulatory Visit: Payer: Self-pay | Admitting: Interventional Cardiology

## 2022-03-10 ENCOUNTER — Other Ambulatory Visit: Payer: Self-pay | Admitting: Interventional Cardiology

## 2022-03-10 ENCOUNTER — Telehealth: Payer: Self-pay | Admitting: Interventional Cardiology

## 2022-03-10 MED ORDER — AMLODIPINE BESYLATE 10 MG PO TABS: 10.0000 mg | ORAL_TABLET | Freq: Every day | ORAL | 0 refills | Status: DC

## 2022-03-10 NOTE — Telephone Encounter (Signed)
Pt's medication was sent to pt's pharmacy as requested. Confirmation received.  °

## 2022-03-10 NOTE — Telephone Encounter (Signed)
*  STAT* If patient is at the pharmacy, call can be transferred to refill team.   1. Which medications need to be refilled? (please list name of each medication and dose if known)  amLODipine (NORVASC) 10 MG tablet  2. Which pharmacy/location (including street and city if local pharmacy) is medication to be sent to? [id number]- [place], [place] - [id number]  3. Do they need a 30 day or 90 day supply? 7 days  Patient is out of medication   Patient needs a 7 day supply to hold him over until his medication comes in the mail

## 2022-03-23 ENCOUNTER — Telehealth: Payer: Self-pay | Admitting: Podiatry

## 2022-03-23 ENCOUNTER — Other Ambulatory Visit: Payer: Self-pay | Admitting: Podiatry

## 2022-03-23 DIAGNOSIS — E1142 Type 2 diabetes mellitus with diabetic polyneuropathy: Secondary | ICD-10-CM

## 2022-03-23 DIAGNOSIS — Z89432 Acquired absence of left foot: Secondary | ICD-10-CM

## 2022-03-23 DIAGNOSIS — L97511 Non-pressure chronic ulcer of other part of right foot limited to breakdown of skin: Secondary | ICD-10-CM

## 2022-03-23 NOTE — Telephone Encounter (Signed)
Pt's daughter Elmyra Ricks left vm stating pt has 2 new areas of concern on foot & are requesting an order be sent to [facility] home health so they can do home visit.

## 2022-03-23 NOTE — Telephone Encounter (Signed)
Pt's daughter notified/reb

## 2022-04-06 ENCOUNTER — Other Ambulatory Visit: Payer: Self-pay | Admitting: Interventional Cardiology

## 2022-04-18 ENCOUNTER — Telehealth: Payer: Self-pay | Admitting: Podiatry

## 2022-04-18 NOTE — Telephone Encounter (Signed)
Pt calling to see if orthotics are in.   Please advise.  Uses sign language interpeter

## 2022-05-01 NOTE — Progress Notes (Unsigned)
Cardiology Office Note   Date:  05/02/2022   ID:  Christian, [name] 03/21/1941, MRN [id number]  PCP:  Christian Kroner, DO    No chief complaint on file.  CAD  Wt Readings from Last 3 Encounters:  05/02/22 188 lb (85.3 kg)  03/11/21 185 lb (83.9 kg)  07/09/20 185 lb 12.8 oz (84.3 kg)       History of Present Illness: Christian [name] is a 81 y.o. male   with a hx of Diabetes, HTN, HL, Charcot-Marie-Tooth disease, peptic ulcer disease.  He was evaluated for angina in December 12, 2012. He was set up for cardiac catheterization but was found to be anemic. Instead, he underwent GI evaluation with EGD which demonstrated gastric ulcers. His symptoms improved with improved hemoglobin.     A stress test was done in 8/18 which was negative for ischemia.  The left ventricular ejection fraction is normal (55-65%). Nuclear stress EF: 59%. There was no ST segment deviation noted during stress. This is a low risk study.   Prior to his visit in 13-Dec-2019, his wife passed away.     He had a bowel obstruction but this was managed conservatively.  He required dialysis temporarily in 2/21-4/21.  He lost weight.  Angina in the past improved when anemia resolved.   Has some SHOB with bending down.    Walks 30 minutes at a relaxed pace.  Has some DOE with trying to walk fast.  He has fallen.  No falls in 8 months. No syncope.   Had an ER visit and was treated for CHF at [facility] ER.   Denies : Chest pain. Dizziness.  Nitroglycerin use. Orthopnea. Palpitations. Paroxysmal nocturnal dyspnea. Shortness of breath. Syncope.    Chronic left leg edema.  Past Medical History:  Diagnosis Date   Angina pectoris [facility])    Nuclear stress test 8/18: EF 59, fixed inf-lat defect likely attenuation, no ischemia, Low Risk   Arthritis    CMT (Charcot-Marie-Tooth disease)    Deafness    Diverticulosis    DM (diabetes mellitus) ([place])    HLD (hyperlipidemia)    HTN (hypertension)    Iron deficiency anemia    Multiple  gastric ulcers    Overactive bladder    Ulcer     Past Surgical History:  Procedure Laterality Date   COLONOSCOPY N/A 08/27/2013   Procedure: COLONOSCOPY;  Surgeon: Jerene Bears, MD;  Location: [facility];  Service: Gastroenterology;  Laterality: N/A;   ESOPHAGOGASTRODUODENOSCOPY N/A 08/27/2013   Procedure: ESOPHAGOGASTRODUODENOSCOPY (EGD);  Surgeon: Jerene Bears, MD;  Location: [facility];  Service: Gastroenterology;  Laterality: N/A;   FOOT AMPUTATION THROUGH METATARSAL Left    FOOT SURGERY     age 13   TOTAL HIP ARTHROPLASTY Bilateral 2002, 2003   TOTAL KNEE ARTHROPLASTY Bilateral 1997/12/12     Current Outpatient Medications  Medication Sig Dispense Refill   ACCU-CHEK SMARTVIEW test strip USE 1 STRIP TO CHECK GLUCOSE TWICE DAILY AS DIRECTED     albuterol (VENTOLIN HFA) 108 (90 Base) MCG/ACT inhaler Inhale into the lungs.     amLODipine (NORVASC) 10 MG tablet TAKE 1 TABLET BY MOUTH DAILY 30 tablet 0   BREO ELLIPTA 200-25 MCG/ACT AEPB 1 puff daily.     busPIRone (BUSPAR) 7.5 MG tablet Take 7.5 mg by mouth 2 (two) times daily.     diclofenac (VOLTAREN) 50 MG EC tablet Take 1 tablet by mouth 2 (two) times daily as needed.  escitalopram (LEXAPRO) 5 MG tablet Take 5 mg by mouth daily.     furosemide (LASIX) 20 MG tablet TAKE 1 TABLET BY MOUTH EVERY DAY 90 tablet 1   isosorbide mononitrate (IMDUR) 60 MG 24 hr tablet Take 1 tablet (60 mg total) by mouth daily. Please make overdue appt with Dr. Irish Lack before anymore refills. Thank you 3rd and Final Attempt 15 tablet 0   ketoconazole (NIZORAL) 2 % cream Apply topically.     linezolid (ZYVOX) 600 MG tablet Take 1 tablet (600 mg total) by mouth 2 (two) times daily. 14 tablet 0   LORazepam (ATIVAN) 1 MG tablet Take 1 mg by mouth daily as needed for anxiety or sleep.     Multiple Vitamin (MULTIVITAMIN) capsule Take 1 capsule by mouth daily.     mupirocin cream (BACTROBAN) 2 % APPLY 1 APPLICATION  TOPICALLY TWICE DAILY 15 g 0    nitroGLYCERIN (NITROSTAT) 0.4 MG SL tablet Place 1 tablet (0.4 mg total) under the tongue every 5 (five) minutes as needed for chest pain. Please keep upcoming appt in May with Dr. Irish Lack. Thank you 75 tablet 0   polyethylene glycol (MIRALAX / GLYCOLAX) 17 g packet Take 17 g by mouth daily as needed for mild constipation. 14 each 0   polyethylene glycol powder (GLYCOLAX/MIRALAX) 17 GM/SCOOP powder Take by mouth.     pravastatin (PRAVACHOL) 40 MG tablet Take 40 mg by mouth daily.     promethazine-dextromethorphan (PROMETHAZINE-DM) 6.25-15 MG/5ML syrup SMARTSIG:5-10 Milliliter(s) By Mouth Every 6 Hours PRN     solifenacin (VESICARE) 5 MG tablet Take 5 mg by mouth daily.     sulfamethoxazole-trimethoprim (BACTRIM) 400-80 MG tablet Take 1 tablet by mouth 2 (two) times daily. 28 tablet 0   tamsulosin (FLOMAX) 0.4 MG CAPS capsule Take 0.4 mg by mouth in the morning and at bedtime.     tiZANidine (ZANAFLEX) 4 MG tablet Take by mouth.     torsemide (DEMADEX) 20 MG tablet Take by mouth.     Current Facility-Administered Medications  Medication Dose Route Frequency Provider Last Rate Last Admin   cadexomer iodine (IODOSORB) 0.9 % gel   Topical Once per day on Mon Wed Fri [name], MD        Allergies:   Lipitor [atorvastatin]    Social History:  The patient  reports that he has never smoked. He has never used smokeless tobacco. He reports that he does not drink alcohol and does not use drugs.   Family History:  The patient's family history includes Breast cancer in his sister; Cervical cancer in his mother and sister; Diabetes in his brother and sister; Heart disease in his sister.    ROS:  Please see the history of present illness.   Otherwise, review of systems are positive for poor balance compared to younger days.   All other systems are reviewed and negative.    PHYSICAL EXAM: VS:  BP 120/60 (BP Location: Left Arm, Patient Position: Sitting, Cuff Size: Normal)   Pulse 67   Ht '5\' 8"'$   (1.727 m)   Wt 188 lb (85.3 kg)   SpO2 92%   BMI 28.59 kg/m  , BMI Body mass index is 28.59 kg/m. GEN: Well nourished, well developed, in no acute distress HEENT: normal Neck: no JVD, carotid bruits, or masses Cardiac: RRR; no murmurs, rubs, or gallops,left leg edema - chronic, improves with leg elevation Respiratory:  clear to auscultation bilaterally, normal work of breathing GI: soft, nontender, nondistended, + BS  MS: no deformity or atrophy Skin: warm and dry, no rash Neuro:  Strength and sensation are intact; slow gait- with walker Psych: euthymic mood, full affect   EKG:   The ekg ordered today demonstrates NSR, nonspecific ST changes   Recent Labs: No results found for requested labs within last 365 days.   Lipid Panel No results found for: "CHOL", "TRIG", "HDL", "CHOLHDL", "VLDL", "LDLCALC", "LDLDIRECT"   Other studies Reviewed: Additional studies/ records that were reviewed today with results demonstrating: Cr 1.48 in 6/23..   ASSESSMENT AND PLAN:  Angina pectoris: Sx controlled.  Negative stress test in 2018.Continue Imdur which should also be good for fluid management.  SHOB: Stop DEmadex.  COntinue Furosemide 20 mg daily.  Can take extra 20 mg daily for increased swelling.  CHF: check echo to see if systolic or diastolic. Stop demadex.  Take furosemide 20 mg dialy.  Can take extra 20 mg if e has increased leg swelling.  HTN: The current medical regimen is effective;  continue present plan and medications. Hyperlipidemia: Needs lipids checked.  Will check today as he is fasting.  Type 2 DM:  He follows his fasting glucose.  Check A1C.     Current medicines are reviewed at length with the patient today.  The patient concerns regarding his medicines were addressed.  The following changes have been made:  as above  Labs/ tests ordered today include: as above No orders of the defined types were placed in this encounter.   Recommend 150 minutes/week of aerobic  exercise Low fat, low carb, high fiber diet recommended  Disposition:   FU in 1 year   Signed, Larae Grooms, MD  05/02/2022 11:33 AM    [facility] [place], [place],   [address] Phone: [phone]; Fax: [phone]

## 2022-05-02 ENCOUNTER — Ambulatory Visit: Payer: Medicare Other | Admitting: Interventional Cardiology

## 2022-05-02 ENCOUNTER — Encounter: Payer: Self-pay | Admitting: Interventional Cardiology

## 2022-05-02 VITALS — BP 120/60 | HR 67 | Ht 68.0 in | Wt 188.0 lb

## 2022-05-02 DIAGNOSIS — Z794 Long term (current) use of insulin: Secondary | ICD-10-CM

## 2022-05-02 DIAGNOSIS — E119 Type 2 diabetes mellitus without complications: Secondary | ICD-10-CM

## 2022-05-02 DIAGNOSIS — E782 Mixed hyperlipidemia: Secondary | ICD-10-CM | POA: Diagnosis not present

## 2022-05-02 DIAGNOSIS — I10 Essential (primary) hypertension: Secondary | ICD-10-CM | POA: Diagnosis not present

## 2022-05-02 DIAGNOSIS — I209 Angina pectoris, unspecified: Secondary | ICD-10-CM | POA: Diagnosis not present

## 2022-05-02 NOTE — Patient Instructions (Addendum)
Medication Instructions:  Your physician has recommended you make the following change in your medication: Stop torsemide. Continue furosemide 20 mg by mouth daily.  Make take one extra furosemide daily as needed for swelling  *If you need a refill on your cardiac medications before your next appointment, please call your pharmacy*   Lab Work: Lab work to be done today--CBC, CMET, Lipids, A1c If you have labs (blood work) drawn today and your tests are completely normal, you will receive your results only by: [place] (if you have MyChart) OR A paper copy in the mail If you have any lab test that is abnormal or we need to change your treatment, we will call you to review the results.   Testing/Procedures: Your physician has requested that you have an echocardiogram. Echocardiography is a painless test that uses sound waves to create images of your heart. It provides your doctor with information about the size and shape of your heart and how well your heart's chambers and valves are working. This procedure takes approximately one hour. There are no restrictions for this procedure.    Follow-Up: At [facility], you and your health needs are our priority.  As part of our continuing mission to provide you with exceptional heart care, we have created designated Provider Care Teams.  These Care Teams include your primary Cardiologist (physician) and Advanced Practice Providers (APPs -  Physician Assistants and Nurse Practitioners) who all work together to provide you with the care you need, when you need it.  We recommend signing up for the patient portal called "MyChart".  Sign up information is provided on this After Visit Summary.  MyChart is used to connect with patients for Virtual Visits (Telemedicine).  Patients are able to view lab/test results, encounter notes, upcoming appointments, etc.  Non-urgent messages can be sent to your provider as well.   To learn more about what you  can do with MyChart, go to [email or web address].    Your next appointment:   6 month(s)  The format for your next appointment:   In Person  Provider:   Larae Grooms, MD     Other Instructions    Important Information About Sugar

## 2022-05-03 LAB — COMPREHENSIVE METABOLIC PANEL
ALT: 13 IU/L (ref 0–44)
AST: 20 IU/L (ref 0–40)
Albumin/Globulin Ratio: 1.4 (ref 1.2–2.2)
Albumin: 4.2 g/dL (ref 3.8–4.8)
Alkaline Phosphatase: 146 IU/L — ABNORMAL HIGH (ref 44–121)
BUN/Creatinine Ratio: 30 — ABNORMAL HIGH (ref 10–24)
BUN: 45 mg/dL — ABNORMAL HIGH (ref 8–27)
Bilirubin Total: 0.5 mg/dL (ref 0.0–1.2)
CO2: 23 mmol/L (ref 20–29)
Calcium: 9.2 mg/dL (ref 8.6–10.2)
Chloride: 101 mmol/L (ref 96–106)
Creatinine, Ser: 1.49 mg/dL — ABNORMAL HIGH (ref 0.76–1.27)
Globulin, Total: 3.1 g/dL (ref 1.5–4.5)
Glucose: 115 mg/dL — ABNORMAL HIGH (ref 70–99)
Potassium: 4.3 mmol/L (ref 3.5–5.2)
Sodium: 142 mmol/L (ref 134–144)
Total Protein: 7.3 g/dL (ref 6.0–8.5)
eGFR: 48 mL/min/{1.73_m2} — ABNORMAL LOW (ref >59)

## 2022-05-03 LAB — LIPID PANEL
Chol/HDL Ratio: 2.2 ratio (ref 0.0–5.0)
Cholesterol, Total: 155 mg/dL (ref 100–199)
HDL: 72 mg/dL (ref >39)
LDL Chol Calc (NIH): 69 mg/dL (ref 0–99)
Triglycerides: 72 mg/dL (ref 0–149)
VLDL Cholesterol Cal: 14 mg/dL (ref 5–40)

## 2022-05-03 LAB — CBC
Hematocrit: 41.6 % (ref 37.5–51.0)
Hemoglobin: 13.7 g/dL (ref 13.0–17.7)
MCH: 29 pg (ref 26.6–33.0)
MCHC: 32.9 g/dL (ref 31.5–35.7)
MCV: 88 fL (ref 79–97)
Platelets: 213 10*3/uL (ref 150–450)
RBC: 4.72 x10E6/uL (ref 4.14–5.80)
RDW: 13 % (ref 11.6–15.4)
WBC: 7.6 10*3/uL (ref 3.4–10.8)

## 2022-05-03 LAB — HEMOGLOBIN A1C
Est. average glucose Bld gHb Est-mCnc: 186 mg/dL
Hgb A1c MFr Bld: 8.1 % — ABNORMAL HIGH (ref 4.8–5.6)

## 2022-05-04 ENCOUNTER — Telehealth: Payer: Self-pay | Admitting: Interventional Cardiology

## 2022-05-04 NOTE — Telephone Encounter (Signed)
Daughter states she was returning call. Please advise  

## 2022-05-04 NOTE — Telephone Encounter (Signed)
The patient daughter Elmyra Ricks (ok per [facility]) has been notified of the result and verbalized understanding.  All questions (if any) were answered. Precious Gilding, RN 05/04/2022 1:57 PM

## 2022-05-16 ENCOUNTER — Other Ambulatory Visit: Payer: Medicare Other

## 2022-05-19 NOTE — Telephone Encounter (Signed)
LVM for pt to call back.

## 2022-05-30 ENCOUNTER — Ambulatory Visit: Payer: Medicare Other | Attending: Cardiology

## 2022-05-30 DIAGNOSIS — I10 Essential (primary) hypertension: Secondary | ICD-10-CM | POA: Diagnosis not present

## 2022-05-30 DIAGNOSIS — I209 Angina pectoris, unspecified: Secondary | ICD-10-CM

## 2022-05-30 LAB — ECHOCARDIOGRAM COMPLETE
Area-P 1/2: 2.44 cm2
S' Lateral: 2.7 cm

## 2022-06-02 ENCOUNTER — Other Ambulatory Visit: Payer: Self-pay | Admitting: Family

## 2022-06-02 ENCOUNTER — Other Ambulatory Visit: Payer: Self-pay | Admitting: Interventional Cardiology

## 2022-06-06 ENCOUNTER — Ambulatory Visit: Payer: Medicare Other | Admitting: Podiatry

## 2022-06-27 ENCOUNTER — Ambulatory Visit: Payer: Medicare Other | Admitting: Podiatry

## 2022-07-03 ENCOUNTER — Ambulatory Visit: Payer: Medicare Other | Admitting: Podiatry

## 2022-07-03 DIAGNOSIS — B351 Tinea unguium: Secondary | ICD-10-CM | POA: Diagnosis not present

## 2022-07-03 DIAGNOSIS — E1142 Type 2 diabetes mellitus with diabetic polyneuropathy: Secondary | ICD-10-CM

## 2022-07-03 DIAGNOSIS — M79674 Pain in right toe(s): Secondary | ICD-10-CM | POA: Diagnosis not present

## 2022-07-03 DIAGNOSIS — L84 Corns and callosities: Secondary | ICD-10-CM

## 2022-07-03 DIAGNOSIS — M79675 Pain in left toe(s): Secondary | ICD-10-CM | POA: Diagnosis not present

## 2022-07-03 DIAGNOSIS — Z89432 Acquired absence of left foot: Secondary | ICD-10-CM

## 2022-07-03 NOTE — Progress Notes (Signed)
  Subjective:  Patient ID: Christian [name], male    DOB: 10-May-1941,  MRN: [id number]  Chief Complaint  Patient presents with   Nail Problem    DFC, pu diabetic shoes/inserts    81 y.o. male presents with the above complaint. History confirmed with patient. Patient presenting with pain related to dystrophic thickened elongated nails on the right foot. Patient is unable to trim own nails related to nail dystrophy and/or mobility issues. Patient does  have a history of T2DM with neuropathy. He also has a history of Left foot Transmetatarsal amputation. Denies any open wound on left foot. Has been monitoring an area of callus on the left foot. Trying to get new DM shoes to offload left forefoot and has the inserts but difficulty getting the shoes.   Objective:  Physical Exam: warm, good capillary refill nail exam onychomycosis of the toenails, onycholysis, and dystrophic nails DP pulses palpable, PT pulses palpable, and protective sensation absent Left Foot:  Prior TMA, pre ulcerative callus left plantar foot, no open wound drainage or erythema/edema Right Foot: Pain with palpation of nails due to elongation and dystrophic growth.      Assessment:   1. Pain due to onychomycosis of toenails of both feet   2. Pre-ulcerative calluses   3. History of transmetatarsal amputation of left foot ([place])   4. DM type 2 with diabetic peripheral neuropathy ([place])      Plan:  Patient was evaluated and treated and all questions answered.  #Pre ulcerative callus left plantar forefoot - Recommend offloading with DM shoes and cushioned liners. Pt is at high risk for reulceration due to plantar pressures in setting of TMA.  - Continue offloading and monitoring for any areas of breakdown. Encouraged patient to call if ulceration redness swelling or drainage occurs to be seen ahead of next [place] apt  #Onychomycosis with pain  -Nails palliatively debrided as below. -Educated on self-care  Procedure: Nail  Debridement Rationale: Pain Type of Debridement: manual, sharp debridement. Instrumentation: Nail nipper, rotary burr. Number of Nails: 5  Return in about 3 months (around 10/03/2022) for [facility].         Everitt Amber, DPM [facility] [place] [facility]

## 2022-09-07 ENCOUNTER — Telehealth: Payer: Self-pay | Admitting: Podiatry

## 2022-09-07 NOTE — Telephone Encounter (Signed)
Patient called today to check on inserts, he does not want to try for the diabetic shoes.

## 2022-09-08 ENCOUNTER — Ambulatory Visit: Payer: Medicare Other | Admitting: Podiatry

## 2022-09-08 DIAGNOSIS — M79675 Pain in left toe(s): Secondary | ICD-10-CM

## 2022-09-08 DIAGNOSIS — M79674 Pain in right toe(s): Secondary | ICD-10-CM

## 2022-09-08 DIAGNOSIS — L97422 Non-pressure chronic ulcer of left heel and midfoot with fat layer exposed: Secondary | ICD-10-CM | POA: Diagnosis not present

## 2022-09-08 DIAGNOSIS — I739 Peripheral vascular disease, unspecified: Secondary | ICD-10-CM

## 2022-09-08 DIAGNOSIS — B351 Tinea unguium: Secondary | ICD-10-CM | POA: Diagnosis not present

## 2022-09-08 DIAGNOSIS — Z89432 Acquired absence of left foot: Secondary | ICD-10-CM

## 2022-09-08 DIAGNOSIS — E1142 Type 2 diabetes mellitus with diabetic polyneuropathy: Secondary | ICD-10-CM

## 2022-09-08 DIAGNOSIS — L84 Corns and callosities: Secondary | ICD-10-CM

## 2022-09-08 NOTE — Progress Notes (Signed)
Subjective:  Patient ID: Christian [name], male    DOB: 03/01/1941,  MRN: [id number]  Chief Complaint  Patient presents with   Nail Problem    Diabetic Foot Care     81 y.o. male presents for routine footcare for the right foot also with concern for some spots on the bottom of the left midfoot.  Concerned that there may be an ulceration present.  Patient has not seen any drainage but he is also unable to thoroughly evaluate his feet.  He cannot reach down or apply dressings to himself.  Denies any nausea vomiting fever chills.  Objective:  General: AAO x3, NAD  Dermatological: Onychomycosis of the nails of the right foot which are dystrophic yellow discolored and growing abnormally causing pain.  Attention directed to the plantar aspect of the right foot there is no open lesion however there is preulcerative callus noted that was debrided.  Attention directed the plantar aspect of the left foot with transmetatarsal amputation there is noted to be a eschar and hyperkeratotic lesion with underlying ulceration present to the level of subcutaneous fat tissue with healthy wound bed no evidence of erythema drainage or deep infection.  The ulceration measures approximately 0.3 x 0.3 x 0.2 cm postdebridement.  A second area of discoloration more proximally and laterally was debrided with no underlying ulceration healthy skin underlying.    Vascular:  Dorsalis Pedis artery and Posterior Tibial artery pedal pulses are 2/4 bilateral.  Capillary fill time < 3 sec to all digits.   Neruologic: Grossly intact via light touch bilateral. Protective threshold intact to all sites bilateral.   Musculoskeletal: S/p L foot TMA  Gait: Assisted with walker/cane    Assessment:   1. Ulcer of left midfoot with fat layer exposed ([place])   2. Pain due to onychomycosis of toenails of both feet   3. Pre-ulcerative calluses   4. History of transmetatarsal amputation of left foot ([place])   5. DM type 2 with diabetic  peripheral neuropathy ([place])   6. PAD (peripheral artery disease) ([place])      Plan:  Patient was evaluated and treated and all questions answered.  Ulcer plantar aspect of the left midfoot without evidence of infection to the level of subcutaneous fat tissue -We discussed the etiology and factors that are a part of the wound healing process.  We also discussed the risk of infection both soft tissue and osteomyelitis from open ulceration.  Discussed the risk of limb loss if this happens or worsens. -Debridement as below. -Dressed with Betadine, DSD. -Continue home dressing changes  2-3 times weekly with Betadine and gauze dressing versus adhesive bandage -Continue off-loading with restricted weight bearing. -Vascular testing deferred -HgbA1c: 7.8 -Last antibiotics: No indication for antibiotics at this time as no evidence of infection -Imaging: Deferred as superficial ulceration without evidence of deep infection -Referral for home health placed at this visit per the patient's family members request.  I do believe the patient will benefit from skilled nursing for wound care once to twice weekly. -Additionally I recommend the patient be seen at a wound care center for the wound on his left foot.  His daughter states that they have a connection at a wound care center in [facility] and she will contact them to set up an appointment for weekly wound care there as well.  Procedure: Excisional Debridement of Wound Rationale: Removal of non-viable soft tissue from the wound to promote healing.  Anesthesia: none Post-Debridement Wound Measurements: 0.3 cm x  0.3 cm x 0.2 cm  Type of Debridement: Sharp Excisional Tissue Removed: Non-viable soft tissue Depth of Debridement: subcutaneous tissue. Technique: Sharp excisional debridement to bleeding, viable wound base.  Dressing: Dry, sterile, compression dressing. Disposition: Patient tolerated procedure well.    #Onychomycosis with pain  -Nails  palliatively debrided as below. -Educated on self-care  Procedure: Nail Debridement Rationale: Pain Type of Debridement: manual, sharp debridement. Instrumentation: Nail nipper, rotary burr. Number of Nails: 5  Return in about 3 weeks (around 09/29/2022) for Follow-up left plantar midfoot ulceration.         Everitt Amber, DPM [facility] [place] [facility]

## 2022-10-03 ENCOUNTER — Ambulatory Visit: Payer: Medicare Other | Admitting: Podiatry

## 2022-10-03 DIAGNOSIS — L84 Corns and callosities: Secondary | ICD-10-CM | POA: Diagnosis not present

## 2022-10-03 DIAGNOSIS — Z89432 Acquired absence of left foot: Secondary | ICD-10-CM

## 2022-10-03 DIAGNOSIS — L97422 Non-pressure chronic ulcer of left heel and midfoot with fat layer exposed: Secondary | ICD-10-CM

## 2022-10-03 DIAGNOSIS — E1142 Type 2 diabetes mellitus with diabetic polyneuropathy: Secondary | ICD-10-CM | POA: Diagnosis not present

## 2022-10-03 DIAGNOSIS — I739 Peripheral vascular disease, unspecified: Secondary | ICD-10-CM

## 2022-10-03 NOTE — Progress Notes (Signed)
  Subjective:  Patient ID: Christian [name], male    DOB: 27-Nov-1940,  MRN: [id number]  Chief Complaint  Patient presents with   Nail Problem    Diabetic Foot Care     82 y.o. male presents for follow-up of small ulceration that was present previously on the plantar aspect of the left foot.  Patient has a history of transmetatarsal tarsal amputation on the left foot as well as diabetes mellitus type 2 with peripheral neuropathy.  He is also presenting for paperwork so that we can proceed with custom-made orthotics for offloading.  Patient has been having a home care nurse come out to his house 3 times a week to help with wound care and monitoring of the small ulceration that was present.  They have been placing a bandage and putting lotion on the foot.  Objective:  General: AAO x3, NAD  Dermatological: Attention to the plantar aspect of the left foot which has prior ulceration has now mostly healed there is a small eschar present 2 to 3 mm in diameter however no underlying ulceration is present no open wound maceration erythema or edema.  Much improved from prior.  No open ulceration present on the right plantar forefoot or foot there is dry skin present on the right foot.  Vascular:  Dorsalis Pedis artery and Posterior Tibial artery pedal pulses are 2/4 bilateral.  Capillary fill time < 3 sec to all digits.   Neruologic: Grossly intact via light touch bilateral. Protective threshold intact to all sites bilateral.   Musculoskeletal: S/p L foot TMA  Gait: Assisted with walker/cane  Assessment:   1. Pre-ulcerative calluses   2. Ulcer of left midfoot with fat layer exposed ([place])   3. PAD (peripheral artery disease) ([place])   4. History of transmetatarsal amputation of left foot ([place])   5. DM type 2 with diabetic peripheral neuropathy ([place])     Plan:  Patient was evaluated and treated and all questions answered.  Ulcer plantar aspect of the left midfoot without evidence of infection to  the level of subcutaneous fat tissue -healed with local wound care and offloading  -Debrided like hyperkeratotic callus at this visit with 15 blade to healthy level without incident -Dressed with adhesive bandage -Continue home home care RN for application of adhesive bandage and moisturizing the feet with lotion 3 times per week until the wound is fully healed -Continue off-loading with restricted weight bearing. -Vascular testing deferred -HgbA1c: 7.8 -Last antibiotics: No indication for antibiotics at this time as no evidence of infection -Will proceed with custom-made orthotics diabetic inserts for his shoes, patient has previously been casted and further paperwork is filled out for this         Everitt Amber, [place] / [facility]

## 2022-10-17 ENCOUNTER — Other Ambulatory Visit (INDEPENDENT_AMBULATORY_CARE_PROVIDER_SITE_OTHER): Payer: Medicare Other | Admitting: Podiatry

## 2022-10-17 DIAGNOSIS — M14679 Charcot's joint, unspecified ankle and foot: Secondary | ICD-10-CM

## 2022-10-17 DIAGNOSIS — L84 Corns and callosities: Secondary | ICD-10-CM

## 2022-10-17 DIAGNOSIS — E1142 Type 2 diabetes mellitus with diabetic polyneuropathy: Secondary | ICD-10-CM

## 2022-10-17 NOTE — Progress Notes (Signed)
Order for DM inserts placed.

## 2022-10-20 ENCOUNTER — Ambulatory Visit: Payer: Medicare Other | Admitting: Interventional Cardiology

## 2022-10-20 NOTE — Progress Notes (Deleted)
Cardiology Office Note   Date:  10/20/2022   ID:  Christian [name], Christian [name] 11-Jun-1941, MRN [id number]  PCP:  Christian Kroner, DO    No chief complaint on file.  CAD  Wt Readings from Last 3 Encounters:  05/02/22 188 lb (85.3 kg)  03/11/21 185 lb (83.9 kg)  07/09/20 185 lb 12.8 oz (84.3 kg)       History of Present Illness: Christian [name] is a 82 y.o. male  with a hx of Diabetes, HTN, HL, Charcot-Marie-Tooth disease, peptic ulcer disease.  He was evaluated for angina in 2012/12/01. He was set up for cardiac catheterization but was found to be anemic. Instead, he underwent GI evaluation with EGD which demonstrated gastric ulcers. His symptoms improved with improved hemoglobin.     A stress test was done in 8/18 which was negative for ischemia.  The left ventricular ejection fraction is normal (55-65%). Nuclear stress EF: 59%. There was no ST segment deviation noted during stress. This is a low risk study.   Prior to his visit in 12-02-19, his wife passed away.     He had a bowel obstruction but this was managed conservatively.  He required dialysis temporarily in 2/21-4/21.  He lost weight.   Angina in the past improved when anemia resolved.   Noted in 12-01-2021: "Has some SHOB with bending down.     Walks 30 minutes at a relaxed pace.  Has some DOE with trying to walk fast.  He has fallen.  No falls in 8 months. No syncope.    Had an ER visit and was treated for CHF at [facility] ER.  Chronic left leg edema"  12-01-21 echo showed normal LV function, EF 60 to 65%.  Normal valvular function.  Past Medical History:  Diagnosis Date   Angina pectoris [facility])    Nuclear stress test 8/18: EF 59, fixed inf-lat defect likely attenuation, no ischemia, Low Risk   Arthritis    CMT (Charcot-Marie-Tooth disease)    Deafness    Diverticulosis    DM (diabetes mellitus) ([place])    HLD (hyperlipidemia)    HTN (hypertension)    Iron deficiency anemia    Multiple gastric ulcers    Overactive bladder     Ulcer     Past Surgical History:  Procedure Laterality Date   COLONOSCOPY N/A 08/27/2013   Procedure: COLONOSCOPY;  Surgeon: Jerene Bears, MD;  [facility]: [facility];  Service: Gastroenterology;  Laterality: N/A;   ESOPHAGOGASTRODUODENOSCOPY N/A 08/27/2013   Procedure: ESOPHAGOGASTRODUODENOSCOPY (EGD);  Surgeon: Jerene Bears, MD;  Location: [facility];  Service: Gastroenterology;  Laterality: N/A;   FOOT AMPUTATION THROUGH METATARSAL Left    FOOT SURGERY     age 22   TOTAL HIP ARTHROPLASTY Bilateral 2002, 2003   TOTAL KNEE ARTHROPLASTY Bilateral 01-Dec-1997     Current Outpatient Medications  Medication Sig Dispense Refill   ACCU-CHEK SMARTVIEW test strip USE 1 STRIP TO CHECK GLUCOSE TWICE DAILY AS DIRECTED     albuterol (VENTOLIN HFA) 108 (90 Base) MCG/ACT inhaler Inhale into the lungs.     amLODipine (NORVASC) 10 MG tablet TAKE 1 TABLET BY MOUTH DAILY 30 tablet 11   BREO ELLIPTA 200-25 MCG/ACT AEPB 1 puff daily.     busPIRone (BUSPAR) 7.5 MG tablet Take 7.5 mg by mouth 2 (two) times daily.     diclofenac (VOLTAREN) 50 MG EC tablet Take 1 tablet by mouth 2 (two) times daily as needed.     escitalopram (  LEXAPRO) 5 MG tablet Take 5 mg by mouth daily.     furosemide (LASIX) 20 MG tablet TAKE 1 TABLET BY MOUTH EVERY DAY 90 tablet 1   isosorbide mononitrate (IMDUR) 60 MG 24 hr tablet Take 1 tablet (60 mg total) by mouth daily. Please make overdue appt with Dr. Irish Lack before anymore refills. Thank you 3rd and Final Attempt 15 tablet 0   ketoconazole (NIZORAL) 2 % cream Apply topically.     linezolid (ZYVOX) 600 MG tablet Take 1 tablet (600 mg total) by mouth 2 (two) times daily. 14 tablet 0   LORazepam (ATIVAN) 1 MG tablet Take 1 mg by mouth daily as needed for anxiety or sleep.     Multiple Vitamin (MULTIVITAMIN) capsule Take 1 capsule by mouth daily.     mupirocin cream (BACTROBAN) 2 % APPLY 1 APPLICATION TOPICALLY  TWICE DAILY 15 g 0   nitroGLYCERIN (NITROSTAT) 0.4 MG SL tablet Place 1  tablet (0.4 mg total) under the tongue every 5 (five) minutes as needed for chest pain. Please keep upcoming appt in May with Dr. Irish Lack. Thank you 75 tablet 0   polyethylene glycol (MIRALAX / GLYCOLAX) 17 g packet Take 17 g by mouth daily as needed for mild constipation. 14 each 0   polyethylene glycol powder (GLYCOLAX/MIRALAX) 17 GM/SCOOP powder Take by mouth.     pravastatin (PRAVACHOL) 40 MG tablet Take 40 mg by mouth daily.     promethazine-dextromethorphan (PROMETHAZINE-DM) 6.25-15 MG/5ML syrup SMARTSIG:5-10 Milliliter(s) By Mouth Every 6 Hours PRN     solifenacin (VESICARE) 5 MG tablet Take 5 mg by mouth daily.     sulfamethoxazole-trimethoprim (BACTRIM) 400-80 MG tablet Take 1 tablet by mouth 2 (two) times daily. 28 tablet 0   tamsulosin (FLOMAX) 0.4 MG CAPS capsule Take 0.4 mg by mouth in the morning and at bedtime.     tiZANidine (ZANAFLEX) 4 MG tablet Take by mouth.     Current Facility-Administered Medications  Medication Dose Route Frequency Provider Last Rate Last Admin   cadexomer iodine (IODOSORB) 0.9 % gel   Topical Once per day on Mon Wed Fri [name], MD        Allergies:   Lipitor [atorvastatin]    Social History:  The patient  reports that he has never smoked. He has never used smokeless tobacco. He reports that he does not drink alcohol and does not use drugs.   Family History:  The patient's ***family history includes Breast cancer in his sister; Cervical cancer in his mother and sister; Diabetes in his brother and sister; Heart disease in his sister.    ROS:  Please see the history of present illness.   Otherwise, review of systems are positive for ***.   All other systems are reviewed and negative.    PHYSICAL EXAM: VS:  There were no vitals taken for this visit. , BMI There is no height or weight on file to calculate BMI. GEN: Well nourished, well developed, in no acute distress HEENT: normal Neck: no JVD, carotid bruits, or masses Cardiac: ***RRR; no  murmurs, rubs, or gallops,no edema  Respiratory:  clear to auscultation bilaterally, normal work of breathing GI: soft, nontender, nondistended, + BS MS: no deformity or atrophy Skin: warm and dry, no rash Neuro:  Strength and sensation are intact Psych: euthymic mood, full affect   EKG:   The ekg ordered today demonstrates ***   Recent Labs: 05/02/2022: ALT 13; BUN 45; Creatinine, Ser 1.49; Hemoglobin 13.7; Platelets 213; Potassium  4.3; Sodium 142   Lipid Panel    Component Value Date/Time   CHOL 155 05/02/2022 1214   TRIG 72 05/02/2022 1214   HDL 72 05/02/2022 1214   CHOLHDL 2.2 05/02/2022 1214   LDLCALC 69 05/02/2022 1214     Other studies Reviewed: Additional studies/ records that were reviewed today with results demonstrating: ***.   ASSESSMENT AND PLAN:  Angina pectoris: Shortness of breath: Has been treated with furosemide 20 to 40 mg daily based on his swelling. Congestive heart failure: Chronic diastolic heart failure. Hypertension: Hyperlipidemia: Type 2 diabetes:   Current medicines are reviewed at length with the patient today.  The patient concerns regarding his medicines were addressed.  The following changes have been made:  No change***  Labs/ tests ordered today include: *** No orders of the defined types were placed in this encounter.   Recommend 150 minutes/week of aerobic exercise Low fat, low carb, high fiber diet recommended  Disposition:   FU in ***   Signed, Larae Grooms, MD  10/20/2022 8:19 AM    [facility] [place], [place], [place]  [address] Phone: [phone]; Fax: [phone]

## 2022-11-01 ENCOUNTER — Telehealth: Payer: Self-pay | Admitting: Podiatry

## 2022-11-01 NOTE — Telephone Encounter (Signed)
Patient daughter would like a call back she is upset about not being given the inserts when they came in and January she was told they were not there.   Patient was recasted and started the whole process over again.

## 2022-11-06 ENCOUNTER — Ambulatory Visit (INDEPENDENT_AMBULATORY_CARE_PROVIDER_SITE_OTHER): Payer: Medicare Other | Admitting: Podiatry

## 2022-11-06 DIAGNOSIS — E1142 Type 2 diabetes mellitus with diabetic polyneuropathy: Secondary | ICD-10-CM

## 2022-11-06 DIAGNOSIS — L84 Corns and callosities: Secondary | ICD-10-CM

## 2022-11-06 DIAGNOSIS — Z89432 Acquired absence of left foot: Secondary | ICD-10-CM

## 2022-11-06 DIAGNOSIS — M14679 Charcot's joint, unspecified ankle and foot: Secondary | ICD-10-CM

## 2022-11-06 NOTE — Progress Notes (Signed)
Pt seen for pick up of diabetic inserts  States they are not what he wanted and does not want.  [facility], states he has been there did not work.  I explained we are not equipped to provide him with custom handmade liners/inserts and will need to seek these elsewhere

## 2022-11-07 ENCOUNTER — Telehealth: Payer: Self-pay | Admitting: Orthopedic Surgery

## 2022-11-07 NOTE — Telephone Encounter (Signed)
Patient requesting information shoe inserts that dr. Sharol Given gave him last time he got them he is coming by to pick up that information next Tuesday.Christian [name]

## 2022-11-07 NOTE — Telephone Encounter (Signed)
Can you please call pt. He has not been in the office since 2022 and the inserts require an office visit for insurance to document need.

## 2022-12-05 ENCOUNTER — Ambulatory Visit: Payer: Medicare Other | Admitting: Family

## 2022-12-05 ENCOUNTER — Ambulatory Visit: Payer: Medicare Other | Admitting: Podiatry

## 2022-12-05 ENCOUNTER — Encounter: Payer: Self-pay | Admitting: Family

## 2022-12-05 DIAGNOSIS — Z89432 Acquired absence of left foot: Secondary | ICD-10-CM

## 2022-12-13 ENCOUNTER — Encounter: Payer: Self-pay | Admitting: Family

## 2022-12-13 NOTE — Progress Notes (Signed)
Office Visit Note   Patient: Christian [name]           Date of Birth: [birth year]           MRN: [id number] Visit Date: 12/05/2022              Requested by: [name], DO [place],  [place] [address] PCP: [name], DO  Chief Complaint  Patient presents with   Left Foot - Follow-up    Hx left transmet amputation      HPI: The patient is a 82 year old gentleman seen today for concern of L fitting orthotic with spacer for his left transmetatarsal amputation he has been to several different locations of [place] [facility] and unfortunately the inserts he has been provided are ill fitting he states they are much too big and do not directly about his residual foot.  He slips and slides around in the shoewear fears falling is unstable.  Has had falls due to inadequate shoe wear.  Assessment & Plan: Visit Diagnoses: No diagnosis found.  Plan: Given an order for a handmade custom orthotic for his transmetatarsal amputation with a spacer carbon fiber plate as well.  Follow-Up Instructions: Return in about 4 weeks (around 01/02/2023), or if symptoms worsen or fail to improve.   Ortho Exam  Patient is alert, oriented, no adenopathy, well-dressed, normal affect, normal respiratory effort. On examination of the left foot his transmetatarsal amputation is well-healed there is no callus no impending skin ulceration no erythema  Imaging: No results found. No images are attached to the encounter.  Labs: Lab Results  Component Value Date   HGBA1C 8.1 (H) 05/02/2022   HGBA1C 7.6 (H) 03/12/2021   HGBA1C (H) 12/04/2009    10.5 (NOTE) The ADA recommends the following therapeutic goal for glycemic control related to Hgb A1c measurement: Goal of therapy: <6.5 Hgb A1c  Reference: American Diabetes Association: Clinical Practice Recommendations 2010, Diabetes Care, 2010, 33: (Suppl  1).   ESRSEDRATE 57 (H) 03/11/2021   CRP 16.9 (H) 03/11/2021   REPTSTATUS 03/17/2021  FINAL 03/12/2021   GRAMSTAIN  10/21/2010    NO WBC SEEN NO SQUAMOUS EPITHELIAL CELLS SEEN NO ORGANISMS SEEN   GRAMSTAIN  10/21/2010    NO WBC SEEN NO SQUAMOUS EPITHELIAL CELLS SEEN NO ORGANISMS SEEN   CULT  03/12/2021    NO GROWTH 5 DAYS Performed at [facility], [place] [address], [place], [place] [address]    [place] 10/21/2010     Lab Results  Component Value Date   ALBUMIN 4.2 05/02/2022   ALBUMIN 2.6 (L) 03/13/2021   ALBUMIN 3.3 (L) 12/03/2009    Lab Results  Component Value Date   MG 1.7 03/13/2021   No results found for: "VD25OH"  No results found for: "PREALBUMIN"    Latest Ref Rng & Units 05/02/2022   12:14 PM 03/13/2021    1:17 AM 03/12/2021    6:20 AM  CBC EXTENDED  WBC 3.4 - 10.8 x10E3/uL 7.6  6.8  7.5   RBC 4.14 - 5.80 x10E6/uL 4.72  4.13  4.13   Hemoglobin 13.0 - 17.7 g/dL 13.7  11.6  11.8   HCT 37.5 - 51.0 % 41.6  36.1  35.6   Platelets 150 - 450 x10E3/uL 213  281  226      There is no height or weight on file to calculate BMI.  Orders:  No orders of the defined types were placed in  this encounter.  No orders of the defined types were placed in this encounter.    Procedures: No procedures performed  Clinical Data: No additional findings.  ROS:  All other systems negative, except as noted in the HPI. Review of Systems  Objective: Vital Signs: There were no vitals taken for this visit.  Specialty Comments:  No specialty comments available.  PMFS History: Patient Active Problem List   Diagnosis Date Noted   Decreased functional mobility and endurance 06/15/2021   Pressure injury of skin 03/15/2021   Wound cellulitis 03/12/2021   Acute-on-chronic kidney injury 03/12/2021   Fall at home, initial encounter 09/14/2020   Stage 3a chronic kidney disease 09/11/2020   Gastro-esophageal reflux disease with esophagitis 03/16/2020   Intractable vomiting with nausea 03/13/2020   Decreased activities of daily living  (ADL) 02/26/2020   Dysphagia 02/26/2020   Impaired functional mobility, balance, gait, and endurance 02/26/2020   Acute respiratory failure with hypoxia 10/27/2019   Sepsis 10/21/2019   Generalized anxiety disorder 09/01/2019   Primary insomnia 09/01/2019   GERD without esophagitis 07/23/2019   Meningioma 07/23/2019   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   Ulcer of left foot, limited to breakdown of skin 02/07/2018   Chronic pain of both shoulders 08/09/2017   Pain in right wrist 08/09/2017   Atherosclerosis of renal artery 04/24/2017   Type 2 diabetes mellitus without complications [id number]   Chronic left-sided low back pain with left-sided sciatica 07/24/2016   Lower extremity edema 01/28/2015   Angina pectoris 07/08/2014   Anemia, unspecified 05/06/2014   Acute gastric ulcer 08/27/2013   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   IDA (iron deficiency anemia) 08/25/2013   Mixed hyperlipidemia 08/13/2013   Essential hypertension, benign 08/13/2013   Past Medical History:  Diagnosis Date   Angina pectoris    Nuclear stress test 8/18: EF 59, fixed inf-lat defect likely attenuation, no ischemia, Low Risk   Arthritis    CMT (Charcot-Marie-Tooth disease)    Deafness    Diverticulosis    DM (diabetes mellitus)    HLD (hyperlipidemia)    HTN (hypertension)    Iron deficiency anemia    Multiple gastric ulcers    Overactive bladder    Ulcer     Family History  Problem Relation Age of Onset   Cervical cancer Mother        mets   Breast cancer Sister        mets   Diabetes Sister    Diabetes Brother    Heart disease Sister    Cervical cancer Sister     Past Surgical History:  Procedure Laterality Date   COLONOSCOPY N/A 08/27/2013   Procedure: COLONOSCOPY;  Surgeon: Jerene Bears, MD;  Location: [facility];  Service: Gastroenterology;  Laterality: N/A;   ESOPHAGOGASTRODUODENOSCOPY N/A 08/27/2013   Procedure:  ESOPHAGOGASTRODUODENOSCOPY (EGD);  Surgeon: Jerene Bears, MD;  Location: [facility];  Service: Gastroenterology;  Laterality: N/A;   FOOT AMPUTATION THROUGH METATARSAL Left    FOOT SURGERY     age 83   TOTAL HIP ARTHROPLASTY Bilateral 2002, 2003   TOTAL KNEE ARTHROPLASTY Bilateral 1999   Social History   Occupational History   Occupation: retired Personal assistant  Tobacco Use   Smoking status: Never   Smokeless tobacco: Never  Substance and Sexual Activity   Alcohol use: No   Drug use: No   Sexual activity: Not on file

## 2022-12-21 ENCOUNTER — Telehealth: Payer: Self-pay | Admitting: Family

## 2022-12-21 NOTE — Telephone Encounter (Signed)
Patient daughter dropped off paperwork to Dr Malachy Mood office for patient inserts and they were supposed to fax that information over to [place] so we can keep his appt on 04/16 please advise with Joni Reining his daughter at [phone]

## 2022-12-22 NOTE — Telephone Encounter (Signed)
[name] on Nicole's VM stating we do not have anything new from his office. I do have a scan in his chart from 09/19/22 that Dr. Mayford Knife signed on 08/22/22. She may call back to discuss further but we will be more than happy to see him on 12/26/22.

## 2022-12-22 NOTE — Telephone Encounter (Signed)
This is not familiar to me, sorry.

## 2022-12-26 ENCOUNTER — Ambulatory Visit: Payer: Medicare Other | Admitting: Family

## 2023-01-02 ENCOUNTER — Ambulatory Visit: Payer: Medicare Other | Admitting: Orthopedic Surgery

## 2023-02-02 ENCOUNTER — Other Ambulatory Visit: Payer: Self-pay | Admitting: Interventional Cardiology

## 2023-02-19 ENCOUNTER — Other Ambulatory Visit: Payer: Self-pay | Admitting: Interventional Cardiology

## 2023-05-01 ENCOUNTER — Emergency Department (HOSPITAL_COMMUNITY): Payer: Medicare Other

## 2023-05-01 ENCOUNTER — Inpatient Hospital Stay (HOSPITAL_COMMUNITY)
Admission: EM | Admit: 2023-05-01 | Discharge: 2023-05-10 | DRG: 988 | Disposition: A | Discharge status: Skilled Nursing Facility | Payer: Medicare Other | Attending: Internal Medicine | Admitting: Internal Medicine

## 2023-05-01 DIAGNOSIS — Y9301 Activity, walking, marching and hiking: Secondary | ICD-10-CM | POA: Diagnosis present

## 2023-05-01 DIAGNOSIS — N1832 Chronic kidney disease, stage 3b: Secondary | ICD-10-CM | POA: Diagnosis not present

## 2023-05-01 DIAGNOSIS — E1151 Type 2 diabetes mellitus with diabetic peripheral angiopathy without gangrene: Secondary | ICD-10-CM | POA: Diagnosis present

## 2023-05-01 DIAGNOSIS — Z888 Allergy status to other drugs, medicaments and biological substances status: Secondary | ICD-10-CM

## 2023-05-01 DIAGNOSIS — I5032 Chronic diastolic (congestive) heart failure: Secondary | ICD-10-CM | POA: Diagnosis present

## 2023-05-01 DIAGNOSIS — I340 Nonrheumatic mitral (valve) insufficiency: Secondary | ICD-10-CM | POA: Diagnosis not present

## 2023-05-01 DIAGNOSIS — Z8711 Personal history of peptic ulcer disease: Secondary | ICD-10-CM | POA: Diagnosis not present

## 2023-05-01 DIAGNOSIS — Z833 Family history of diabetes mellitus: Secondary | ICD-10-CM

## 2023-05-01 DIAGNOSIS — D631 Anemia in chronic kidney disease: Secondary | ICD-10-CM | POA: Diagnosis present

## 2023-05-01 DIAGNOSIS — I130 Hypertensive heart and chronic kidney disease with heart failure and stage 1 through stage 4 chronic kidney disease, or unspecified chronic kidney disease: Secondary | ICD-10-CM | POA: Diagnosis present

## 2023-05-01 DIAGNOSIS — I2510 Atherosclerotic heart disease of native coronary artery without angina pectoris: Secondary | ICD-10-CM | POA: Diagnosis present

## 2023-05-01 DIAGNOSIS — M869 Osteomyelitis, unspecified: Secondary | ICD-10-CM | POA: Diagnosis present

## 2023-05-01 DIAGNOSIS — Z96643 Presence of artificial hip joint, bilateral: Secondary | ICD-10-CM | POA: Diagnosis present

## 2023-05-01 DIAGNOSIS — Z89432 Acquired absence of left foot: Secondary | ICD-10-CM

## 2023-05-01 DIAGNOSIS — Z96653 Presence of artificial knee joint, bilateral: Secondary | ICD-10-CM | POA: Diagnosis present

## 2023-05-01 DIAGNOSIS — H9190 Unspecified hearing loss, unspecified ear: Secondary | ICD-10-CM | POA: Diagnosis present

## 2023-05-01 DIAGNOSIS — Y92008 Other place in unspecified non-institutional (private) residence as the place of occurrence of the external cause: Secondary | ICD-10-CM

## 2023-05-01 DIAGNOSIS — E1142 Type 2 diabetes mellitus with diabetic polyneuropathy: Secondary | ICD-10-CM | POA: Diagnosis present

## 2023-05-01 DIAGNOSIS — B9562 Methicillin resistant Staphylococcus aureus infection as the cause of diseases classified elsewhere: Secondary | ICD-10-CM | POA: Diagnosis present

## 2023-05-01 DIAGNOSIS — E872 Acidosis, unspecified: Secondary | ICD-10-CM | POA: Diagnosis present

## 2023-05-01 DIAGNOSIS — K219 Gastro-esophageal reflux disease without esophagitis: Secondary | ICD-10-CM | POA: Diagnosis present

## 2023-05-01 DIAGNOSIS — E782 Mixed hyperlipidemia: Secondary | ICD-10-CM | POA: Diagnosis present

## 2023-05-01 DIAGNOSIS — M86141 Other acute osteomyelitis, right hand: Secondary | ICD-10-CM | POA: Diagnosis not present

## 2023-05-01 DIAGNOSIS — M7989 Other specified soft tissue disorders: Secondary | ICD-10-CM | POA: Diagnosis present

## 2023-05-01 DIAGNOSIS — N183 Chronic kidney disease, stage 3 unspecified: Secondary | ICD-10-CM | POA: Insufficient documentation

## 2023-05-01 DIAGNOSIS — N1831 Chronic kidney disease, stage 3a: Secondary | ICD-10-CM | POA: Diagnosis present

## 2023-05-01 DIAGNOSIS — R0600 Dyspnea, unspecified: Secondary | ICD-10-CM | POA: Diagnosis not present

## 2023-05-01 DIAGNOSIS — L03012 Cellulitis of left finger: Secondary | ICD-10-CM | POA: Diagnosis present

## 2023-05-01 DIAGNOSIS — M1990 Unspecified osteoarthritis, unspecified site: Secondary | ICD-10-CM | POA: Diagnosis present

## 2023-05-01 DIAGNOSIS — W1830XA Fall on same level, unspecified, initial encounter: Secondary | ICD-10-CM | POA: Diagnosis present

## 2023-05-01 DIAGNOSIS — I10 Essential (primary) hypertension: Secondary | ICD-10-CM | POA: Diagnosis present

## 2023-05-01 DIAGNOSIS — M86142 Other acute osteomyelitis, left hand: Secondary | ICD-10-CM | POA: Diagnosis not present

## 2023-05-01 DIAGNOSIS — E871 Hypo-osmolality and hyponatremia: Secondary | ICD-10-CM | POA: Diagnosis present

## 2023-05-01 DIAGNOSIS — E1169 Type 2 diabetes mellitus with other specified complication: Principal | ICD-10-CM | POA: Diagnosis present

## 2023-05-01 DIAGNOSIS — Z794 Long term (current) use of insulin: Secondary | ICD-10-CM | POA: Diagnosis not present

## 2023-05-01 DIAGNOSIS — N3281 Overactive bladder: Secondary | ICD-10-CM | POA: Diagnosis present

## 2023-05-01 DIAGNOSIS — G600 Hereditary motor and sensory neuropathy: Secondary | ICD-10-CM | POA: Diagnosis present

## 2023-05-01 DIAGNOSIS — E1122 Type 2 diabetes mellitus with diabetic chronic kidney disease: Secondary | ICD-10-CM | POA: Diagnosis present

## 2023-05-01 DIAGNOSIS — Z8249 Family history of ischemic heart disease and other diseases of the circulatory system: Secondary | ICD-10-CM | POA: Diagnosis not present

## 2023-05-01 DIAGNOSIS — D649 Anemia, unspecified: Secondary | ICD-10-CM | POA: Diagnosis not present

## 2023-05-01 DIAGNOSIS — E663 Overweight: Secondary | ICD-10-CM | POA: Diagnosis present

## 2023-05-01 DIAGNOSIS — Z79899 Other long term (current) drug therapy: Secondary | ICD-10-CM

## 2023-05-01 DIAGNOSIS — E119 Type 2 diabetes mellitus without complications: Secondary | ICD-10-CM

## 2023-05-01 DIAGNOSIS — Z6827 Body mass index (BMI) 27.0-27.9, adult: Secondary | ICD-10-CM

## 2023-05-01 DIAGNOSIS — R7881 Bacteremia: Secondary | ICD-10-CM | POA: Diagnosis present

## 2023-05-01 DIAGNOSIS — Z7951 Long term (current) use of inhaled steroids: Secondary | ICD-10-CM

## 2023-05-01 LAB — CBC WITH DIFFERENTIAL/PLATELET
Abs Immature Granulocytes: 0.05 10*3/uL (ref 0.00–0.07)
Basophils Absolute: 0 10*3/uL (ref 0.0–0.1)
Basophils Relative: 0 %
Eosinophils Absolute: 0 10*3/uL (ref 0.0–0.5)
Eosinophils Relative: 0 %
HCT: 35.9 % — ABNORMAL LOW (ref 39.0–52.0)
Hemoglobin: 11.8 g/dL — ABNORMAL LOW (ref 13.0–17.0)
Immature Granulocytes: 0 %
Lymphocytes Relative: 9 %
Lymphs Abs: 1.2 10*3/uL (ref 0.7–4.0)
MCH: 28.9 pg (ref 26.0–34.0)
MCHC: 32.9 g/dL (ref 30.0–36.0)
MCV: 88 fL (ref 80.0–100.0)
Monocytes Absolute: 1.5 10*3/uL — ABNORMAL HIGH (ref 0.1–1.0)
Monocytes Relative: 11 %
Neutro Abs: 10.4 10*3/uL — ABNORMAL HIGH (ref 1.7–7.7)
Neutrophils Relative %: 80 %
Platelets: 214 10*3/uL (ref 150–400)
RBC: 4.08 MIL/uL — ABNORMAL LOW (ref 4.22–5.81)
RDW: 13.2 % (ref 11.5–15.5)
WBC: 13.2 10*3/uL — ABNORMAL HIGH (ref 4.0–10.5)
nRBC: 0 % (ref 0.0–0.2)

## 2023-05-01 LAB — COMPREHENSIVE METABOLIC PANEL
ALT: 21 U/L (ref 0–44)
AST: 27 U/L (ref 15–41)
Albumin: 2.9 g/dL — ABNORMAL LOW (ref 3.5–5.0)
Alkaline Phosphatase: 154 U/L — ABNORMAL HIGH (ref 38–126)
Anion gap: 16 — ABNORMAL HIGH (ref 5–15)
BUN: 33 mg/dL — ABNORMAL HIGH (ref 8–23)
CO2: 20 mmol/L — ABNORMAL LOW (ref 22–32)
Calcium: 8.3 mg/dL — ABNORMAL LOW (ref 8.9–10.3)
Chloride: 94 mmol/L — ABNORMAL LOW (ref 98–111)
Creatinine, Ser: 1.7 mg/dL — ABNORMAL HIGH (ref 0.61–1.24)
GFR, Estimated: 41 mL/min — ABNORMAL LOW (ref >60)
Glucose, Bld: 209 mg/dL — ABNORMAL HIGH (ref 70–99)
Potassium: 4.4 mmol/L (ref 3.5–5.1)
Sodium: 130 mmol/L — ABNORMAL LOW (ref 135–145)
Total Bilirubin: 0.9 mg/dL (ref 0.3–1.2)
Total Protein: 6.6 g/dL (ref 6.5–8.1)

## 2023-05-01 LAB — I-STAT CG4 LACTIC ACID, ED: Lactic Acid, Venous: 1.1 mmol/L (ref 0.5–1.9)

## 2023-05-01 LAB — HEMOGLOBIN A1C
Hgb A1c MFr Bld: 8.7 % — ABNORMAL HIGH (ref 4.8–5.6)
Mean Plasma Glucose: 202.99 mg/dL

## 2023-05-01 MED ORDER — PIPERACILLIN-TAZOBACTAM 3.375 G IVPB
3.3750 g | Freq: Three times a day (TID) | INTRAVENOUS | Status: DC
  Administered 2023-05-02 (×2): 3.375 g via INTRAVENOUS
  Filled 2023-05-01 (×2): qty 50

## 2023-05-01 MED ORDER — VANCOMYCIN HCL 1750 MG/350ML IV SOLN
1750.0000 mg | Freq: Once | INTRAVENOUS | Status: AC
  Administered 2023-05-01: 1750 mg via INTRAVENOUS
  Filled 2023-05-01: qty 350

## 2023-05-01 MED ORDER — INSULIN ASPART 100 UNIT/ML IJ SOLN
0.0000 [IU] | Freq: Three times a day (TID) | INTRAMUSCULAR | Status: DC
  Administered 2023-05-02: 2 [IU] via SUBCUTANEOUS
  Administered 2023-05-02: 3 [IU] via SUBCUTANEOUS

## 2023-05-01 MED ORDER — ACETAMINOPHEN 650 MG RE SUPP
650.0000 mg | Freq: Four times a day (QID) | RECTAL | Status: DC | PRN
  Administered 2023-05-07: 650 mg via RECTAL
  Filled 2023-05-01: qty 1

## 2023-05-01 MED ORDER — INSULIN ASPART 100 UNIT/ML IJ SOLN
0.0000 [IU] | Freq: Every day | INTRAMUSCULAR | Status: DC
  Administered 2023-05-02: 2 [IU] via SUBCUTANEOUS

## 2023-05-01 MED ORDER — ACETAMINOPHEN 325 MG PO TABS
650.0000 mg | ORAL_TABLET | Freq: Four times a day (QID) | ORAL | Status: DC | PRN
  Administered 2023-05-02 – 2023-05-10 (×9): 650 mg via ORAL
  Filled 2023-05-01 (×9): qty 2

## 2023-05-01 MED ORDER — PIPERACILLIN-TAZOBACTAM 3.375 G IVPB 30 MIN
3.3750 g | Freq: Once | INTRAVENOUS | Status: AC
  Administered 2023-05-01: 3.375 g via INTRAVENOUS
  Filled 2023-05-01: qty 50

## 2023-05-01 MED ORDER — VANCOMYCIN HCL IN DEXTROSE 1-5 GM/200ML-% IV SOLN
1000.0000 mg | INTRAVENOUS | Status: DC
  Administered 2023-05-02 – 2023-05-04 (×2): 1000 mg via INTRAVENOUS
  Filled 2023-05-01 (×2): qty 200

## 2023-05-01 MED ORDER — VANCOMYCIN HCL IN DEXTROSE 1-5 GM/200ML-% IV SOLN: 1000.0000 mg | Freq: Once | INTRAVENOUS | Status: DC

## 2023-05-01 MED ORDER — SODIUM CHLORIDE 0.9 % IV SOLN: INTRAVENOUS | Status: DC

## 2023-05-01 NOTE — ED Provider Triage Note (Signed)
Emergency Medicine Provider Triage Evaluation Note  Christian [name] , a 82 y.o. male  was evaluated in triage.  Pt complains of left index swelling and redness. Seen at [facility] today and sent in for fever and r/o cellulitis Review of Systems  Positive: Wound, fever Negative:   Physical Exam  BP 112/62 (BP Location: Right Arm)   Pulse 97   Temp 98.7 F (37.1 C) (Oral)   Resp 17   SpO2 95%  Gen:   Awake, no distress   Resp:  Normal effort  MSK:   Moves extremities without difficulty  Other:  Swelling erythema and wound present to the left index finger. 2+ Radial pulse  Medical Decision Making  Medically screening exam initiated at 2:25 PM.  Appropriate orders placed.  Christian [name] was informed that the remainder of the evaluation will be completed by another provider, this initial triage assessment does not replace that evaluation, and the importance of remaining in the ED until their evaluation is complete.     Christian Manges, PA-C 05/01/23 1428

## 2023-05-01 NOTE — ED Notes (Signed)
ED TO INPATIENT HANDOFF REPORT  ED Nurse Name and Phone #: Fleet Contras [phone]  S Name/Age/Gender Christian [name] 82 y.o. male Room/Bed: 004C/004C  Code Status   Code Status: Prior  Home/SNF/Other Home Patient oriented to: self, place, time, and situation Is this baseline? Yes   Triage Complete: Triage complete  Chief Complaint Osteomyelitis [facility]) [M86.9]  Triage Note Triage assisted by pt's daughter d/t pain HoH and prefers sign language. Pt reports Saturday morning he was going into his laundry room and almost fell, injuring his L pointer finger. Significant swelling and redness noted. Pt also states he had a fall that evening. Denies head injury/LOC.    Allergies Allergies  Allergen Reactions   Lipitor [Atorvastatin] Other (See Comments)    unknown    Level of Care/Admitting Diagnosis ED Disposition     ED Disposition  Admit   Condition  --   Comment  Hospital Area: [facility] [[id number]]  Level of Care: Telemetry Medical [104]  May admit patient to [facility] or [facility] if equivalent level of care is available:: Yes  Covid Evaluation: Asymptomatic - no recent exposure (last 10 days) testing not required  Diagnosis: Osteomyelitis [facility]) [[id number]]  Admitting Physician: John Giovanni [[id number]]  Attending Physician: John Giovanni [[id number]]  Certification:: I certify this patient will need inpatient services for at least 2 midnights  Expected Medical Readiness: 05/04/2023          B Medical/Surgery History Past Medical History:  Diagnosis Date   Angina pectoris (HCC)    Nuclear stress test 8/18: EF 59, fixed inf-lat defect likely attenuation, no ischemia, Low Risk   Arthritis    CMT (Charcot-Marie-Tooth disease)    Deafness    Diverticulosis    DM (diabetes mellitus) (HCC)    HLD (hyperlipidemia)    HTN (hypertension)    Iron deficiency anemia    Multiple gastric ulcers    Overactive bladder    Ulcer    Past Surgical  History:  Procedure Laterality Date   COLONOSCOPY N/A 08/27/2013   Procedure: COLONOSCOPY;  Surgeon: Beverley Fiedler, MD;  Location: [facility];  Service: Gastroenterology;  Laterality: N/A;   ESOPHAGOGASTRODUODENOSCOPY N/A 08/27/2013   Procedure: ESOPHAGOGASTRODUODENOSCOPY (EGD);  Surgeon: Beverley Fiedler, MD;  Location: [facility];  Service: Gastroenterology;  Laterality: N/A;   FOOT AMPUTATION THROUGH METATARSAL Left    FOOT SURGERY     age 32   TOTAL HIP ARTHROPLASTY Bilateral 2002, 2003   TOTAL KNEE ARTHROPLASTY Bilateral 1999     A IV Location/Drains/Wounds Patient Lines/Drains/Airways Status     Active Line/Drains/Airways     Name Placement date Placement time Site Days   Peripheral IV 05/01/23 20 G Anterior;Right;Upper Arm 05/01/23  2022  Arm  less than 1   External Urinary Catheter 03/12/21  1354  --  780   Pressure Injury 03/14/21 Heel Right Stage 2 -  Partial thickness loss of dermis presenting as a shallow open injury with a red, pink wound bed without slough. 03/14/21  --  -- 778            Intake/Output Last 24 hours  Intake/Output Summary (Last 24 hours) at 05/01/2023 2205 Last data filed at 05/01/2023 2112 Gross per 24 hour  Intake 50 ml  Output --  Net 50 ml    Labs/Imaging Results for orders placed or performed during the hospital encounter of 05/01/23 (from the past 48 hour(s))  CBC with Differential     Status: Abnormal  Collection Time: 05/01/23  2:29 PM  Result Value Ref Range   WBC 13.2 (H) 4.0 - 10.5 K/uL   RBC 4.08 (L) 4.22 - 5.81 MIL/uL   Hemoglobin 11.8 (L) 13.0 - 17.0 g/dL   HCT 86.5 (L) 78.4 - 69.6 %   MCV 88.0 80.0 - 100.0 fL   MCH 28.9 26.0 - 34.0 pg   MCHC 32.9 30.0 - 36.0 g/dL   RDW 29.5 28.4 - 13.2 %   Platelets 214 150 - 400 K/uL   nRBC 0.0 0.0 - 0.2 %   Neutrophils Relative % 80 %   Neutro Abs 10.4 (H) 1.7 - 7.7 K/uL   Lymphocytes Relative 9 %   Lymphs Abs 1.2 0.7 - 4.0 K/uL   Monocytes Relative 11 %   Monocytes Absolute 1.5  (H) 0.1 - 1.0 K/uL   Eosinophils Relative 0 %   Eosinophils Absolute 0.0 0.0 - 0.5 K/uL   Basophils Relative 0 %   Basophils Absolute 0.0 0.0 - 0.1 K/uL   Immature Granulocytes 0 %   Abs Immature Granulocytes 0.05 0.00 - 0.07 K/uL    Comment: Performed at [facility], [address], [place], [place] [address]  Comprehensive metabolic panel     Status: Abnormal   Collection Time: 05/01/23  2:29 PM  Result Value Ref Range   Sodium 130 (L) 135 - 145 mmol/L   Potassium 4.4 3.5 - 5.1 mmol/L   Chloride 94 (L) 98 - 111 mmol/L   CO2 20 (L) 22 - 32 mmol/L   Glucose, Bld 209 (H) 70 - 99 mg/dL    Comment: Glucose reference range applies only to samples taken after fasting for at least 8 hours.   BUN 33 (H) 8 - 23 mg/dL   Creatinine, Ser 2.72 (H) 0.61 - 1.24 mg/dL   Calcium 8.3 (L) 8.9 - 10.3 mg/dL   Total Protein 6.6 6.5 - 8.1 g/dL   Albumin 2.9 (L) 3.5 - 5.0 g/dL   AST 27 15 - 41 U/L   ALT 21 0 - 44 U/L   Alkaline Phosphatase 154 (H) 38 - 126 U/L   Total Bilirubin 0.9 0.3 - 1.2 mg/dL   GFR, Estimated 41 (L) >60 mL/min    Comment: (NOTE) Calculated using the CKD-EPI Creatinine Equation (2021)    Anion gap 16 (H) 5 - 15    Comment: Performed at [facility], [address] [place], [place] [address]  I-Stat CG4 Lactic Acid     Status: None   Collection Time: 05/01/23  2:40 PM  Result Value Ref Range   Lactic Acid, Venous 1.1 0.5 - 1.9 mmol/L   DG Finger Index Left  Result Date: 05/01/2023 CLINICAL DATA:  Left index finger pain and swelling with clinical symptoms of cellulitis. Diabetes. EXAM: LEFT INDEX FINGER 2+V COMPARISON:  None Available. FINDINGS: Marked diffuse soft tissue swelling of the index finger with dorsal soft tissue irregularity distally with overlying bandage material. Bone destruction and fragmentation involving the distal aspect of the 2nd distal phalanx. Extensive changes of erosive osteoarthritis involving all of the joints of the 2nd through 5th fingers  as well as 2nd and 3rd MCP joints. Extensive erosive and cystic changes involving all of the carpals, bases of the metacarpals and distal radius and ulna. Are also radiocarpal degenerative changes and distal radius and ulna fragmentation. IMPRESSION: 1. Osteomyelitis involving the distal aspect of the 2nd distal phalanx with dorsal soft tissue ulceration/irregularity. 2. 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 Electronically Signed   By: Beckie Salts M.D.   On: 05/01/2023 16:38    Pending Labs Unresulted Labs (From admission, onward)     Start     Ordered   05/01/23 2023  Blood culture (routine x 2)  BLOOD CULTURE X 2,   R (with STAT occurrences)      05/01/23 2024            Vitals/Pain Today's Vitals   05/01/23 1423 05/01/23 1843 05/01/23 2030 05/01/23 2053  BP:  122/66 138/62 117/73  Pulse:  81 89 88  Resp: 17 18 18 17   Temp: 98.7 F (37.1 C) (!) 97.4 F (36.3 C)  98.1 F (36.7 C)  TempSrc: Oral Oral  Oral  SpO2:  95% 95% 96%  PainSc:        Isolation Precautions No active isolations  Medications Medications  vancomycin (VANCOREADY) IVPB 1750 mg/350 mL (has no administration in time range)  piperacillin-tazobactam (ZOSYN) IVPB 3.375 g (0 g Intravenous Stopped 05/01/23 2112)    Mobility walks with device       R Recommendations: See Admitting Provider Note  Report given to:   Additional Notes: NEEDS ASL INTERPRETOR

## 2023-05-01 NOTE — ED Notes (Signed)
Admitting physician at bedside

## 2023-05-01 NOTE — ED Triage Notes (Signed)
Triage assisted by pt's daughter d/t pain HoH and prefers sign language. Pt reports Saturday morning he was going into his laundry room and almost fell, injuring his L pointer finger. Significant swelling and redness noted. Pt also states he had a fall that evening. Denies head injury/LOC.

## 2023-05-01 NOTE — ED Provider Notes (Signed)
[facility] EMERGENCY DEPARTMENT AT [facility] Provider Note   CSN: [id number] Arrival date & time: 05/01/23  1333     History  Chief Complaint  Patient presents with   Finger Injury   Christian [name] is a 82 y.o. male with hearing difficulties who presents with pain and swelling in his left second finger.  His daughter is bedside and helped with obtaining a history from the patient given his hearing difficulties.  He says it for some time he has been picking at the nail on his left second finger because it was bothering him.  On Saturday morning, he was walking into his laundry room and almost fell, catching himself and he noticed a pop in his left second finger.  He said the finger was red and swollen prior to this, but worsened after this incident.  He also fell Saturday evening but denies hitting his head or losing consciousness.  He reports significant pain in the left second finger with some loss of sensation and tightness, but no pain elsewhere in the hand or wrist.  He does note some neck pain which may have been worsened after he denies any shortness of breath, chest pain.     Home Medications Prior to Admission medications   Medication Sig Start Date End Date Taking? Authorizing Provider  ACCU-CHEK SMARTVIEW test strip USE 1 STRIP TO CHECK GLUCOSE TWICE DAILY AS DIRECTED 02/15/20   [provider]  albuterol (VENTOLIN HFA) 108 (90 Base) MCG/ACT inhaler Inhale into the lungs. 07/13/21   [provider]  amLODipine (NORVASC) 10 MG tablet TAKE 1 TABLET BY MOUTH DAILY 02/20/23   [name], MD  BREO ELLIPTA 200-25 MCG/ACT AEPB 1 puff daily. 08/19/21   [provider]  busPIRone (BUSPAR) 7.5 MG tablet Take 7.5 mg by mouth 2 (two) times daily. 11/03/21   [provider]  diclofenac (VOLTAREN) 50 MG EC tablet Take 1 tablet by mouth 2 (two) times daily as needed. 01/10/22   [provider]  escitalopram (LEXAPRO) 5 MG tablet Take 5  mg by mouth daily. 01/24/21   [provider]  furosemide (LASIX) 20 MG tablet TAKE 1 TABLET BY MOUTH EVERY DAY 01/12/22   Asencion Islam, DPM  isosorbide mononitrate (IMDUR) 60 MG 24 hr tablet Take 1 tablet (60 mg total) by mouth daily. Please make overdue appt with Dr. Eldridge Dace before anymore refills. Thank you 3rd and Final Attempt 09/26/21   [name], MD  ketoconazole (NIZORAL) 2 % cream Apply topically. 09/14/21   [provider]  linezolid (ZYVOX) 600 MG tablet Take 1 tablet (600 mg total) by mouth 2 (two) times daily. 06/21/21   Stover, Cassandria Anger, DPM  LORazepam (ATIVAN) 1 MG tablet Take 1 mg by mouth daily as needed for anxiety or sleep. 01/22/20   [provider]  Multiple Vitamin (MULTIVITAMIN) capsule Take 1 capsule by mouth daily.    [provider]  mupirocin cream (BACTROBAN) 2 % APPLY 1 APPLICATION TOPICALLY  TWICE DAILY 06/02/22   [name], NP  nitroGLYCERIN (NITROSTAT) 0.4 MG SL tablet Place 1 tablet (0.4 mg total) under the tongue every 5 (five) minutes as needed for chest pain. Please keep upcoming appt in May with Dr. Eldridge Dace. Thank you 01/09/20   [name], MD  polyethylene glycol (MIRALAX / GLYCOLAX) 17 g packet Take 17 g by mouth daily as needed for mild constipation. 03/16/21   Rolly Salter, MD  polyethylene glycol powder [facility])  17 GM/SCOOP powder Take by mouth.    [provider]  pravastatin (PRAVACHOL) 40 MG tablet Take 40 mg by mouth daily. 12/25/19   [provider]  promethazine-dextromethorphan (PROMETHAZINE-DM) 6.25-15 MG/5ML syrup SMARTSIG:5-10 Milliliter(s) By Mouth Every 6 Hours PRN 09/14/21   [provider]  solifenacin (VESICARE) 5 MG tablet Take 5 mg by mouth daily. 11/09/21   [provider]  sulfamethoxazole-trimethoprim (BACTRIM) 400-80 MG tablet Take 1 tablet by mouth 2 (two) times daily. 10/18/21   Asencion Islam, DPM  tamsulosin (FLOMAX) 0.4 MG CAPS capsule  Take 0.4 mg by mouth in the morning and at bedtime. 11/10/20   [provider]  tiZANidine (ZANAFLEX) 4 MG tablet Take by mouth. 04/07/21   [provider]  pantoprazole (PROTONIX) 40 MG tablet Take 1 tablet (40 mg total) by mouth daily. Patient not taking: Reported on 03/12/2021 12/27/17 03/13/21  Pyrtle, Carie Caddy, MD     Allergies    Lipitor [atorvastatin]    Review of Systems   Review of Systems  Constitutional: Negative.   HENT: Negative.    Eyes: Negative.   Respiratory: Negative.    Cardiovascular: Negative.   Gastrointestinal: Negative.   Endocrine: Negative.   Genitourinary: Negative.   Musculoskeletal:        Pain and swelling in the left second finger  Skin:  Positive for color change and wound.       Red and tense left second finger with a large infected open wound   Neurological: Negative.   Hematological: Negative.   Psychiatric/Behavioral: Negative.      Physical Exam Updated Vital Signs BP 117/73   Pulse 88   Temp 98.1 F (36.7 C) (Oral)   Resp 17   SpO2 96%  Physical Exam Constitutional:      Appearance: Normal appearance.     Comments: Hard of hearing, needs sign interpretation  HENT:     Head: Normocephalic and atraumatic.  Cardiovascular:     Rate and Rhythm: Normal rate and regular rhythm.     Pulses: Normal pulses.  Pulmonary:     Effort: Pulmonary effort is normal.  Musculoskeletal:        General: Swelling, tenderness, deformity and signs of injury present.     Comments: Significant swelling and erythema of the left second finger with an open infected wound on the distal portion.  Skin:    Findings: Erythema and lesion present.  Neurological:     Mental Status: He is alert and oriented to person, place, and time.     Sensory: Sensory deficit present.     Comments: Reported numbness in left second finger    ED Results / Procedures / Treatments   Labs (all labs ordered are listed, but only abnormal results are displayed) Labs  Reviewed  CBC WITH DIFFERENTIAL/PLATELET - Abnormal; Notable for the following components:      Result Value   WBC 13.2 (*)    RBC 4.08 (*)    Hemoglobin 11.8 (*)    HCT 35.9 (*)    Neutro Abs 10.4 (*)    Monocytes Absolute 1.5 (*)    All other components within normal limits  COMPREHENSIVE METABOLIC PANEL - Abnormal; Notable for the following components:   Sodium 130 (*)    Chloride 94 (*)    CO2 20 (*)    Glucose, Bld 209 (*)    BUN 33 (*)    Creatinine, Ser 1.70 (*)    Calcium 8.3 (*)  Albumin 2.9 (*)    Alkaline Phosphatase 154 (*)    GFR, Estimated 41 (*)    Anion gap 16 (*)    All other components within normal limits  CULTURE, BLOOD (ROUTINE X 2)  CULTURE, BLOOD (ROUTINE X 2)  I-STAT CG4 LACTIC ACID, ED  CBG MONITORING, ED   EKG None  Radiology DG Finger Index Left  Result Date: 05/01/2023 CLINICAL DATA:  Left index finger pain and swelling with clinical symptoms of cellulitis. Diabetes. EXAM: LEFT INDEX FINGER 2+V COMPARISON:  None Available. FINDINGS: Marked diffuse soft tissue swelling of the index finger with dorsal soft tissue irregularity distally with overlying bandage material. Bone destruction and fragmentation involving the distal aspect of the 2nd distal phalanx. 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 Extensive erosive and cystic changes involving all of the carpals, bases of the metacarpals and distal radius and ulna. Are also radiocarpal degenerative changes and distal radius and ulna fragmentation. IMPRESSION: 1. Osteomyelitis involving the distal aspect of the 2nd distal phalanx with dorsal soft tissue ulceration/irregularity. 2. 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 Electronically Signed   By: Beckie Salts M.D.   On: 05/01/2023 16:38    Medications Ordered in ED Medications  vancomycin (VANCOREADY) IVPB  1750 mg/350 mL (has no administration in time range)  piperacillin-tazobactam (ZOSYN) IVPB 3.375 g (0 g Intravenous Stopped 05/01/23 2112)    ED Course/ Medical Decision Making/ A&P                                 Medical Decision Making Risk Prescription drug management. Decision regarding hospitalization.   This patient presents to the ED for concern of left second finger pain and swelling and was found to have osteomyelitis. This involves an extensive number of treatment options, and is a complaint that carries with it a high risk of complications and morbidity.    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  The pertinent results include:    Lactic acid 1.1 CBC - white blood cells 13.2, hemoglobin 11.8 CMP - sodium 130, chloride 94, bicarb 20, BUN 33, creatinine 1.7, anion gap 16, alk phos 154 Blood cultures pending  Imaging Studies:  I ordered imaging studies including x-ray of the left index finger I independently visualized and interpreted imaging:  X-ray of the left index finger: Osseous changes of the distal second finger consistent with osteomyelitis  I agree with the radiologist interpretation  Medicines ordered and prescription drug management:  I ordered medication including  Medications  vancomycin (VANCOREADY) IVPB 1750 mg/350 mL (has no administration in time range)  piperacillin-tazobactam (ZOSYN) IVPB 3.375 g (0 g Intravenous Stopped 05/01/23 2112)   for osteomyelitis Reevaluation of the patient after these medicines showed that the patient stayed the same I have reviewed the patients home medicines and have made adjustments as needed  Critical Interventions:  IV antibiotics  Consultations Obtained: Hand surgery, Infectious diseases  Problem List / ED Course:  No diagnosis  found.  MDM:   This is a 82 year old patient with osteomyelitis of the left second finger, confirmed by imaging.  He remains afebrile, leukocytosis of 13. Dr. Janee Morn with hand surgery  was consulted who will evaluate him and plan for surgery tomorrow. He was started on empiric IV vanco/zosyn. ID will need to be consulted in the morning for antibiotic management. His creatinine is elevated at 1.7, up from baseline which appears to be around 1.3, consistent with a possible AKI. He will need to be admitted for treatment of his severe infection.   Disposition:  After consideration of the diagnostic results and the patients response to treatment, I feel that the patent would benefit from inpatient admission for IV antibiotics and possible surgical intervention.   Final Clinical Impression(s) / ED Diagnoses Final diagnoses:  None    Rx / DC Orders ED Discharge Orders     None         Annett Fabian, MD 05/01/23 2223    Charlynne Pander, MD 05/01/23 [phone]

## 2023-05-01 NOTE — Progress Notes (Signed)
Pharmacy Antibiotic Note  Christian [name] is a 82 y.o. male admitted on 05/01/2023 with  osteomyelitis . WBC 13.2, lactate 1.1, afebrile. Pharmacy has been consulted for vancomycin and zosyn dosing.  Plan: Start vancomycin 1000 mg IV every 24 hours (eAUC 517, Scr 1.7) Start zosyn 3.375 g IV every 8 hours  Monitor WBC, temperature, cultures, and renal function     Temp (24hrs), Avg:98.1 F (36.7 C), Min:97.4 F (36.3 C), Max:98.7 F (37.1 C)  Recent Labs  Lab 05/01/23 1429 05/01/23 1440  WBC 13.2*  --   CREATININE 1.70*  --   LATICACIDVEN  --  1.1    CrCl cannot be calculated (Unknown ideal weight.).    Allergies  Allergen Reactions   Lipitor [Atorvastatin] Other (See Comments)    unknown    Antimicrobials this admission: 8/20 vancomycin >>  8/20 zosyn >>   Dose adjustments this admission:   Microbiology results: 8/20 BCx:    Thank you for allowing pharmacy to be a part of this patient's care.  Griffin Dakin 05/01/2023 10:57 PM

## 2023-05-01 NOTE — H&P (Signed)
History and Physical    Christian [name]:[id number] DOB: [birth year] DOA: 05/01/2023  PCP: Montez Hageman, DO  Patient coming from: Home  Chief Complaint: Left index finger swelling  HPI: Christian [name] is a 82 y.o. male with hearing difficulties and needs sign language interpreter.  He has medical history significant of hypertension, hyperlipidemia, type 2 diabetes with peripheral neuropathy, Charcot-Marie-Tooth disease, CAD, PAD, chronic HFpEF, CKD stage IIIa, history of transmetatarsal amputation of left foot, gastric ulcers, iron deficiency anemia, GERD presenting with left index finger swelling and erythema.  Sign language interpretation done by daughter at bedside.  Daughter states patient has been picking at the nail of his left index finger for several months.  About 3 days ago he was in the garage and had a fall causing him to bump the finger into an object.  Patient denies head injury or loss of consciousness from the fall.  His finger now appears more swollen and red.  Patient denies finger pain and not reporting pain anywhere else.  He does not take any blood thinners.  Denies fevers or chills.  No other complaints.  Denies shortness of breath or chest pain.  ED Course: Afebrile. Labs showing WBC 13.2, hemoglobin 11.8 (baseline 11-13), MCV 88.0, sodium 130, chloride 94, bicarb 20, glucose 209, BUN 33, creatinine 1.7 (baseline 1.3-1.5), calcium 8.3, albumin 2.9, alk phos 154 (previously elevated as well), transaminases and T. bili normal, lactic acid 1.1, blood cultures collected.  X-ray of left index finger concerning for osteomyelitis. Patient was given vancomycin and Zosyn. Hand surgery consulted.  Review of Systems:  Review of Systems  All other systems reviewed and are negative.   Past Medical History:  Diagnosis Date   Angina pectoris [facility])    Nuclear stress test 8/18: EF 59, fixed inf-lat defect likely attenuation, no ischemia, Low Risk   Arthritis    CMT  (Charcot-Marie-Tooth disease)    Deafness    Diverticulosis    DM (diabetes mellitus) (HCC)    HLD (hyperlipidemia)    HTN (hypertension)    Iron deficiency anemia    Multiple gastric ulcers    Overactive bladder    Ulcer     Past Surgical History:  Procedure Laterality Date   COLONOSCOPY N/A 08/27/2013   Procedure: COLONOSCOPY;  Surgeon: Beverley Fiedler, MD;  Location: [facility] ENDOSCOPY;  Service: Gastroenterology;  Laterality: N/A;   ESOPHAGOGASTRODUODENOSCOPY N/A 08/27/2013   Procedure: ESOPHAGOGASTRODUODENOSCOPY (EGD);  Surgeon: Beverley Fiedler, MD;  Location: [facility];  Service: Gastroenterology;  Laterality: N/A;   FOOT AMPUTATION THROUGH METATARSAL Left    FOOT SURGERY     age 26   TOTAL HIP ARTHROPLASTY Bilateral 2002, 2003   TOTAL KNEE ARTHROPLASTY Bilateral 1999     reports that he has never smoked. He has never used smokeless tobacco. He reports that he does not drink alcohol and does not use drugs.  Allergies  Allergen Reactions   Lipitor [Atorvastatin] Other (See Comments)    unknown    Family History  Problem Relation Age of Onset   Cervical cancer Mother        mets   Breast cancer Sister        mets   Diabetes Sister    Diabetes Brother    Heart disease Sister    Cervical cancer Sister     Prior to Admission medications   Medication Sig Start Date End Date Taking? Authorizing Provider  ACCU-CHEK SMARTVIEW test strip USE 1 STRIP TO CHECK  GLUCOSE TWICE DAILY AS DIRECTED 02/15/20   [provider]  albuterol (VENTOLIN HFA) 108 (90 Base) MCG/ACT inhaler Inhale into the lungs. 07/13/21   [provider]  amLODipine (NORVASC) 10 MG tablet TAKE 1 TABLET BY MOUTH DAILY 02/20/23   [name], MD  BREO ELLIPTA 200-25 MCG/ACT AEPB 1 puff daily. 08/19/21   [provider]  busPIRone (BUSPAR) 7.5 MG tablet Take 7.5 mg by mouth 2 (two) times daily. 11/03/21   [provider]  diclofenac (VOLTAREN) 50 MG EC tablet Take 1 tablet by  mouth 2 (two) times daily as needed. 01/10/22   [provider]  escitalopram (LEXAPRO) 5 MG tablet Take 5 mg by mouth daily. 01/24/21   [provider]  furosemide (LASIX) 20 MG tablet TAKE 1 TABLET BY MOUTH EVERY DAY 01/12/22   Asencion Islam, DPM  isosorbide mononitrate (IMDUR) 60 MG 24 hr tablet Take 1 tablet (60 mg total) by mouth daily. Please make overdue appt with Dr. Eldridge Dace before anymore refills. Thank you 3rd and Final Attempt 09/26/21   [name], MD  ketoconazole (NIZORAL) 2 % cream Apply topically. 09/14/21   [provider]  linezolid (ZYVOX) 600 MG tablet Take 1 tablet (600 mg total) by mouth 2 (two) times daily. 06/21/21   Stover, Cassandria Anger, DPM  LORazepam (ATIVAN) 1 MG tablet Take 1 mg by mouth daily as needed for anxiety or sleep. 01/22/20   [provider]  Multiple Vitamin (MULTIVITAMIN) capsule Take 1 capsule by mouth daily.    [provider]  mupirocin cream (BACTROBAN) 2 % APPLY 1 APPLICATION TOPICALLY  TWICE DAILY 06/02/22   [name], NP  nitroGLYCERIN (NITROSTAT) 0.4 MG SL tablet Place 1 tablet (0.4 mg total) under the tongue every 5 (five) minutes as needed for chest pain. Please keep upcoming appt in May with Dr. Eldridge Dace. Thank you 01/09/20   [name], MD  polyethylene glycol (MIRALAX / GLYCOLAX) 17 g packet Take 17 g by mouth daily as needed for mild constipation. 03/16/21   Rolly Salter, MD  polyethylene glycol powder (GLYCOLAX/MIRALAX) 17 GM/SCOOP powder Take by mouth.    [provider]  pravastatin (PRAVACHOL) 40 MG tablet Take 40 mg by mouth daily. 12/25/19   [provider]  promethazine-dextromethorphan (PROMETHAZINE-DM) 6.25-15 MG/5ML syrup SMARTSIG:5-10 Milliliter(s) By Mouth Every 6 Hours PRN 09/14/21   [provider]  solifenacin (VESICARE) 5 MG tablet Take 5 mg by mouth daily. 11/09/21   [provider]  sulfamethoxazole-trimethoprim (BACTRIM) 400-80 MG tablet  Take 1 tablet by mouth 2 (two) times daily. 10/18/21   Asencion Islam, DPM  tamsulosin (FLOMAX) 0.4 MG CAPS capsule Take 0.4 mg by mouth in the morning and at bedtime. 11/10/20   [provider]  tiZANidine (ZANAFLEX) 4 MG tablet Take by mouth. 04/07/21   [provider]  pantoprazole (PROTONIX) 40 MG tablet Take 1 tablet (40 mg total) by mouth daily. Patient not taking: Reported on 03/12/2021 12/27/17 03/13/21  Beverley Fiedler, MD    Physical Exam: Vitals:   05/01/23 1423 05/01/23 1843 05/01/23 2030 05/01/23 2053  BP:  122/66 138/62 117/73  Pulse:  81 89 88  Resp: 17 18 18 17   Temp: 98.7 F (37.1 C) (!) 97.4 F (36.3 C)  98.1 F (36.7 C)  TempSrc: Oral Oral  Oral  SpO2:  95% 95% 96%    Physical Exam Vitals reviewed.  Constitutional:      General: He is not in  acute distress.    Comments: Resting comfortably eating food  HENT:     Head: Normocephalic and atraumatic.  Eyes:     Extraocular Movements: Extraocular movements intact.  Cardiovascular:     Rate and Rhythm: Normal rate and regular rhythm.     Pulses: Normal pulses.  Pulmonary:     Effort: Pulmonary effort is normal. No respiratory distress.     Breath sounds: Normal breath sounds. No wheezing or rales.  Abdominal:     General: Bowel sounds are normal. There is no distension.     Palpations: Abdomen is soft.     Tenderness: There is no abdominal tenderness.  Musculoskeletal:     Cervical back: Normal range of motion.     Right lower leg: No edema.     Left lower leg: No edema.     Comments: Left index finger swollen and erythematous.  See images.  Skin:    General: Skin is warm and dry.  Neurological:     General: No focal deficit present.     Mental Status: He is alert and oriented to person, place, and time.         Labs on Admission: I have personally reviewed following labs and imaging studies  CBC: Recent Labs  Lab 05/01/23 1429  WBC 13.2*  NEUTROABS 10.4*  HGB 11.8*  HCT 35.9*   MCV 88.0  PLT 214   Basic Metabolic Panel: Recent Labs  Lab 05/01/23 1429  NA 130*  K 4.4  CL 94*  CO2 20*  GLUCOSE 209*  BUN 33*  CREATININE 1.70*  CALCIUM 8.3*   GFR: CrCl cannot be calculated (Unknown ideal weight.). Liver Function Tests: Recent Labs  Lab 05/01/23 1429  AST 27  ALT 21  ALKPHOS 154*  BILITOT 0.9  PROT 6.6  ALBUMIN 2.9*   No results for input(s): "LIPASE", "AMYLASE" in the last 168 hours. No results for input(s): "AMMONIA" in the last 168 hours. Coagulation Profile: No results for input(s): "INR", "PROTIME" in the last 168 hours. Cardiac Enzymes: No results for input(s): "CKTOTAL", "CKMB", "CKMBINDEX", "TROPONINI" in the last 168 hours. BNP (last 3 results) No results for input(s): "PROBNP" in the last 8760 hours. HbA1C: No results for input(s): "HGBA1C" in the last 72 hours. CBG: No results for input(s): "GLUCAP" in the last 168 hours. Lipid Profile: No results for input(s): "CHOL", "HDL", "LDLCALC", "TRIG", "CHOLHDL", "LDLDIRECT" in the last 72 hours. Thyroid Function Tests: No results for input(s): "TSH", "T4TOTAL", "FREET4", "T3FREE", "THYROIDAB" in the last 72 hours. Anemia Panel: No results for input(s): "VITAMINB12", "FOLATE", "FERRITIN", "TIBC", "IRON", "RETICCTPCT" in the last 72 hours. Urine analysis: No results found for: "COLORURINE", "APPEARANCEUR", "LABSPEC", "PHURINE", "GLUCOSEU", "HGBUR", "BILIRUBINUR", "KETONESUR", "PROTEINUR", "UROBILINOGEN", "NITRITE", "LEUKOCYTESUR"  Radiological Exams on Admission: DG Finger Index Left  Result Date: 05/01/2023 CLINICAL DATA:  Left index finger pain and swelling with clinical symptoms of cellulitis. Diabetes. EXAM: LEFT INDEX FINGER 2+V COMPARISON:  None Available. FINDINGS: Marked diffuse soft tissue swelling of the index finger with dorsal soft tissue irregularity distally with overlying bandage material. Bone destruction and fragmentation involving the distal aspect of the 2nd distal  phalanx. 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 Extensive erosive and cystic changes involving all of the carpals, bases of the metacarpals and distal radius and ulna. Are also radiocarpal degenerative changes and distal radius and ulna fragmentation. IMPRESSION: 1. Osteomyelitis involving the distal  aspect of the 2nd distal phalanx with dorsal soft tissue ulceration/irregularity. 2. 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 Electronically Signed   By: Beckie Salts M.D.   On: 05/01/2023 16:38    Assessment and Plan  Left index finger osteomyelitis WBC 13.2.  No fever or signs of sepsis.  Lactic acid normal.  Appreciate hand surgery recommendations.  Continue IV antibiotics (vancomycin and Zosyn).  Blood cultures pending.  Continue to monitor WBC count.  Consult ID in the morning for recommendations regarding prospect of digit salvage with parenteral antibiotics alone, if prospect is poor, then patient will need amputation of a portion of the digit for source control and will likely undergo surgery after a few days of parenteral antibiotics, may be Thursday evening or Friday per hand surgery.  CKD stage IIIa Mild metabolic acidosis Creatinine 1.7, baseline 1.3-1.5.  Bicarb 20.  Gentle IV fluid hydration and continue to monitor labs.  Avoid nephrotoxic agents/hold home Lasix at this time.  Mild hyponatremia Sodium 130.  Gentle IV fluid hydration and continue to monitor labs.  Check serum osmolarity.  Fall at home Patient denies head injury or loss of consciousness.  No other injuries reported.  PT/OT eval, fall precautions.  Type 2 diabetes Glucose 209.  A1c 8.1 on labs done a year ago, will repeat.  Sensitive sliding scale insulin ACHS.  Chronic HFpEF Echo done in September 2023 showing EF 60 to 65%, grade 2 diastolic dysfunction.  No signs of volume  overload at this time.  Lasix has been held and patient receiving gentle IV fluids given slight elevation of creatinine from baseline and mild hyponatremia.  Check BNP and monitor volume status closely.  Chronic normocytic anemia Hemoglobin 11.8, baseline 11-13.  No signs of bleeding.  Continue to monitor labs.  No indication for blood transfusion at this time.  Informed by the patient's daughter that patient is okay with receiving blood transfusions during this hospitalization if needed.  Hypertension: Blood pressure stable. Hyperlipidemia CAD: Patient is not endorsing any anginal symptoms. PVD GERD/PUD Pharmacy med rec pending.  DVT prophylaxis: SCDs Code Status: Full Code (discussed with the patient and his daughter) Family Communication: Daughter at bedside. Consults called: Hand surgery Level of care: Telemetry bed Admission status: It is my clinical opinion that admission to INPATIENT is reasonable and necessary because of the expectation that this patient will require hospital care that crosses at least 2 midnights to treat this condition based on the medical complexity of the problems presented.  Given the aforementioned information, the predictability of an adverse outcome is felt to be significant.  Christian Giovanni MD [facility]  If 7PM-7AM, please contact night-coverage [email or web address]  05/01/2023, 9:28 PM

## 2023-05-01 NOTE — Progress Notes (Signed)
Contacted by Dr. Silverio Lay regarding this patient's finger infection.  I spoke with the patient's daughter.  History is 3-4 months of fingertip wound, acutely worsening in past several days.   Clinical photos and xrays reviewed.  Patient likely has osteomyelitis of distal portion of distal phalanx, with some surrounding soft-tissue necrosis.  It seems unlikely this infection could be successfully eradicated at this point without amputation of at least a portion of this digit.  Hand Surgery Recommendations:  Admit to medicine for initiation of parenteral antibiotic treatment and ID consult   ID consult for recommendations--specifically regarding prospect of digit salvage in this particular situation with parenteral antibiotics alone  If ID opinion is also that prospect for  digit salvage is poor and recommends amputation of portion of digit for source control, I can provide such, likely after a couple of days of parenteral antibiotics (maybe Thursday evening or Friday)  I will continue to follow patient's progress and look for ID recs once available.  Neil Crouch, MD Hand Surgery

## 2023-05-02 DIAGNOSIS — E1169 Type 2 diabetes mellitus with other specified complication: Secondary | ICD-10-CM | POA: Diagnosis not present

## 2023-05-02 DIAGNOSIS — M86141 Other acute osteomyelitis, right hand: Secondary | ICD-10-CM | POA: Diagnosis not present

## 2023-05-02 DIAGNOSIS — Z794 Long term (current) use of insulin: Secondary | ICD-10-CM

## 2023-05-02 DIAGNOSIS — M869 Osteomyelitis, unspecified: Secondary | ICD-10-CM | POA: Diagnosis not present

## 2023-05-02 LAB — BLOOD CULTURE ID PANEL (REFLEXED) - BCID2

## 2023-05-02 LAB — BASIC METABOLIC PANEL
Anion gap: 14 (ref 5–15)
BUN: 32 mg/dL — ABNORMAL HIGH (ref 8–23)
CO2: 19 mmol/L — ABNORMAL LOW (ref 22–32)
Calcium: 8.6 mg/dL — ABNORMAL LOW (ref 8.9–10.3)
Chloride: 99 mmol/L (ref 98–111)
Creatinine, Ser: 1.65 mg/dL — ABNORMAL HIGH (ref 0.61–1.24)
GFR, Estimated: 42 mL/min — ABNORMAL LOW (ref >60)
Glucose, Bld: 215 mg/dL — ABNORMAL HIGH (ref 70–99)
Potassium: 3.9 mmol/L (ref 3.5–5.1)
Sodium: 132 mmol/L — ABNORMAL LOW (ref 135–145)

## 2023-05-02 LAB — GLUCOSE, CAPILLARY
Glucose-Capillary: 109 mg/dL — ABNORMAL HIGH (ref 70–99)
Glucose-Capillary: 190 mg/dL — ABNORMAL HIGH (ref 70–99)
Glucose-Capillary: 219 mg/dL — ABNORMAL HIGH (ref 70–99)
Glucose-Capillary: 234 mg/dL — ABNORMAL HIGH (ref 70–99)
Glucose-Capillary: 326 mg/dL — ABNORMAL HIGH (ref 70–99)

## 2023-05-02 LAB — CBC
HCT: 41.9 % (ref 39.0–52.0)
Hemoglobin: 14.1 g/dL (ref 13.0–17.0)
MCH: 29.4 pg (ref 26.0–34.0)
MCHC: 33.7 g/dL (ref 30.0–36.0)
MCV: 87.5 fL (ref 80.0–100.0)
Platelets: 208 10*3/uL (ref 150–400)
RBC: 4.79 MIL/uL (ref 4.22–5.81)
RDW: 13.2 % (ref 11.5–15.5)
WBC: 11.8 10*3/uL — ABNORMAL HIGH (ref 4.0–10.5)
nRBC: 0 % (ref 0.0–0.2)

## 2023-05-02 LAB — OSMOLALITY: Osmolality: 307 mosm/kg — ABNORMAL HIGH (ref 275–295)

## 2023-05-02 LAB — BRAIN NATRIURETIC PEPTIDE: B Natriuretic Peptide: 66.9 pg/mL (ref 0.0–100.0)

## 2023-05-02 MED ORDER — INSULIN ASPART 100 UNIT/ML IJ SOLN
0.0000 [IU] | Freq: Three times a day (TID) | INTRAMUSCULAR | Status: DC
  Administered 2023-05-02: 11 [IU] via SUBCUTANEOUS
  Administered 2023-05-04: 1 [IU] via SUBCUTANEOUS
  Administered 2023-05-04: 5 [IU] via SUBCUTANEOUS
  Administered 2023-05-04: 3 [IU] via SUBCUTANEOUS
  Administered 2023-05-05: 2 [IU] via SUBCUTANEOUS
  Administered 2023-05-05: 5 [IU] via SUBCUTANEOUS
  Administered 2023-05-05: 3 [IU] via SUBCUTANEOUS
  Administered 2023-05-06: 11 [IU] via SUBCUTANEOUS
  Administered 2023-05-06: 2 [IU] via SUBCUTANEOUS
  Administered 2023-05-06: 3 [IU] via SUBCUTANEOUS
  Administered 2023-05-07 (×2): 2 [IU] via SUBCUTANEOUS
  Administered 2023-05-08: 5 [IU] via SUBCUTANEOUS

## 2023-05-02 MED ORDER — INSULIN ASPART 100 UNIT/ML IJ SOLN
0.0000 [IU] | Freq: Every day | INTRAMUSCULAR | Status: DC
  Administered 2023-05-03 – 2023-05-04 (×2): 2 [IU] via SUBCUTANEOUS
  Administered 2023-05-05: 3 [IU] via SUBCUTANEOUS
  Administered 2023-05-07: 5 [IU] via SUBCUTANEOUS

## 2023-05-02 MED ORDER — ALBUTEROL SULFATE (2.5 MG/3ML) 0.083% IN NEBU
2.5000 mg | INHALATION_SOLUTION | RESPIRATORY_TRACT | Status: DC
  Administered 2023-05-02 (×2): 2.5 mg via RESPIRATORY_TRACT
  Filled 2023-05-02 (×2): qty 3

## 2023-05-02 MED ORDER — SODIUM CHLORIDE 0.9 % IV SOLN: INTRAVENOUS | Status: DC

## 2023-05-02 MED ORDER — SODIUM CHLORIDE 0.9 % IV SOLN
2.0000 g | Freq: Two times a day (BID) | INTRAVENOUS | Status: DC
  Filled 2023-05-02: qty 12.5

## 2023-05-02 MED ORDER — ALBUTEROL SULFATE (2.5 MG/3ML) 0.083% IN NEBU
2.5000 mg | INHALATION_SOLUTION | RESPIRATORY_TRACT | Status: DC | PRN
  Administered 2023-05-02 – 2023-05-03 (×2): 2.5 mg via RESPIRATORY_TRACT
  Filled 2023-05-02 (×3): qty 3

## 2023-05-02 NOTE — Consult Note (Addendum)
WOC Nurse Consult Note: Reason for Consult: Consult requested for bilat buttocks.  Pt has red moist macerated intact skin with "shaggy edges" to bilat buttocks; appearance is consistent with "chronic tissue damage," which occurs when a patient spends prolonged periods of time in a recliner and skin is frequently moist.  Each side is approx 3X3cm. These are not pressure injuries. Pt agrees that this is the case when he is at home, prior to admission.  Topical treatment orders provided for bedside nurses to perform as follows: Apply barrier cream with each turning and cleaning episode. Please re-consult if further assistance is needed.  Thank-you,  Cammie Mcgee MSN, RN, CWOCN, [place], CNS [phone]

## 2023-05-02 NOTE — Progress Notes (Signed)
Pt has a new onset of fever 101 F oral. HR 120s-130s. Blood culture pending, pt already getting antibiotics. Tylenol given, Garner Nash, NP notified.

## 2023-05-02 NOTE — Progress Notes (Signed)
   05/02/23 0258  Assess: MEWS Score  Temp (!) 101 F (38.3 C)  Assess: MEWS Score  MEWS Temp 1  MEWS Systolic 0  MEWS Pulse 1  MEWS RR 0  MEWS LOC 0  MEWS Score 2  MEWS Score Color Yellow  Assess: if the MEWS score is Yellow or Red  Were vital signs accurate and taken at a resting state? Yes  Does the patient meet 2 or more of the SIRS criteria? Yes  Does the patient have a confirmed or suspected source of infection? Yes  MEWS guidelines implemented  Yes, yellow  Treat  MEWS Interventions Considered administering scheduled or prn medications/treatments as ordered  Take Vital Signs  Increase Vital Sign Frequency  Yellow: Q2hr x1, continue Q4hrs until patient remains green for 12hrs  Escalate  MEWS: Escalate Yellow: Discuss with charge nurse and consider notifying provider and/or RRT  Provider Notification  Provider Name/Title Garner Nash, NP  Date Provider Notified 05/02/23  Time Provider Notified [phone]  Method of Notification Page (secured chat)  Notification Reason Change in status  Provider response  (secured chat)  Date of Provider Response 05/02/23  Time of Provider Response [phone]  Assess: SIRS CRITERIA  SIRS Temperature  1  SIRS Pulse 1  SIRS Respirations  0  SIRS WBC 0  SIRS Score Sum  2   Daniels, NP aware of HR in 130s and Fever 101 F

## 2023-05-02 NOTE — Progress Notes (Signed)
PHARMACY - PHYSICIAN COMMUNICATION CRITICAL VALUE ALERT - BLOOD CULTURE IDENTIFICATION (BCID)  Christian [name] is an 82 y.o. male who presented to [facility] on 05/01/2023 with a chief complaint of left 2nd finger redness and swelling found to have osteomyelitis.   Assessment:  1 of 4 bottles growing MRSA  Name of physician (or Provider) Contacted: ID following  Current antibiotics: vancomycin and cefepime  Changes to prescribed antibiotics recommended:  Patient is on recommended antibiotics - No changes needed  Results for orders placed or performed during the hospital encounter of 05/01/23  Blood Culture ID Panel (Reflexed) (Collected: 05/01/2023  8:28 PM)  Result Value Ref Range   Enterococcus faecalis NOT DETECTED NOT DETECTED   Enterococcus Faecium NOT DETECTED NOT DETECTED   Listeria monocytogenes NOT DETECTED NOT DETECTED   Staphylococcus species DETECTED (A) NOT DETECTED   Staphylococcus aureus (BCID) DETECTED (A) NOT DETECTED   Staphylococcus epidermidis NOT DETECTED NOT DETECTED   Staphylococcus lugdunensis NOT DETECTED NOT DETECTED   Streptococcus species NOT DETECTED NOT DETECTED   Streptococcus agalactiae NOT DETECTED NOT DETECTED   Streptococcus pneumoniae NOT DETECTED NOT DETECTED   Streptococcus pyogenes NOT DETECTED NOT DETECTED   A.calcoaceticus-baumannii NOT DETECTED NOT DETECTED   Bacteroides fragilis NOT DETECTED NOT DETECTED   Enterobacterales NOT DETECTED NOT DETECTED   Enterobacter cloacae complex NOT DETECTED NOT DETECTED   Escherichia coli NOT DETECTED NOT DETECTED   Klebsiella aerogenes NOT DETECTED NOT DETECTED   Klebsiella oxytoca NOT DETECTED NOT DETECTED   Klebsiella pneumoniae NOT DETECTED NOT DETECTED   Proteus species NOT DETECTED NOT DETECTED   Salmonella species NOT DETECTED NOT DETECTED   Serratia marcescens NOT DETECTED NOT DETECTED   Haemophilus influenzae NOT DETECTED NOT DETECTED   Neisseria meningitidis NOT DETECTED NOT DETECTED    Pseudomonas aeruginosa NOT DETECTED NOT DETECTED   Stenotrophomonas maltophilia NOT DETECTED NOT DETECTED   Candida albicans NOT DETECTED NOT DETECTED   Candida auris NOT DETECTED NOT DETECTED   Candida glabrata NOT DETECTED NOT DETECTED   Candida krusei NOT DETECTED NOT DETECTED   Candida parapsilosis NOT DETECTED NOT DETECTED   Candida tropicalis NOT DETECTED NOT DETECTED   Cryptococcus neoformans/gattii NOT DETECTED NOT DETECTED   Meth resistant mecA/C and MREJ DETECTED (A) NOT DETECTED    Loralee Pacas, PharmD, BCPS 05/02/2023  9:37 PM

## 2023-05-02 NOTE — Progress Notes (Signed)
PROGRESS NOTE    Christian [name]  LKG:[id number] DOB: 01-28-1941 DOA: 05/01/2023 PCP: Montez Hageman, DO    Brief Narrative:  Christian [name] is a 82 y.o. male with hearing difficulties and needs sign language interpreter.  He has medical history significant of hypertension, hyperlipidemia, type 2 diabetes with peripheral neuropathy, Charcot-Marie-Tooth disease, CAD, PAD, chronic HFpEF, CKD stage IIIa, history of transmetatarsal amputation of left foot, gastric ulcers, iron deficiency anemia, GERD presenting with left index finger swelling and erythema.  Sign language interpretation done by daughter at bedside.  Daughter states patient has been picking at the nail of his left index finger for several months.    Assessment and Plan: Left index finger osteomyelitis -No fever or signs of sepsis.  -Continue IV antibiotics (vancomycin and Zosyn).   -Blood cultures pending.   -ID consulted: needs or and abx, Limb salvage would be up to hand surgery as it would be based on or findings    CKD stage IIIa Mild metabolic acidosis Creatinine 1.7, baseline 1.3-1.5.  Bicarb 20.  Gentle IV fluid hydration and continue to monitor labs.  Avoid nephrotoxic agents/hold home Lasix at this time.   Mild hyponatremia -trending up   Fall at home Patient denies head injury or loss of consciousness.  No other injuries reported.  PT/OT eval, fall precautions.   Type 2 diabetes -SSI   Chronic HFpEF Echo done in September 2023 showing EF 60 to 65%, grade 2 diastolic dysfunction.  No signs of volume overload at this time.  Lasix has been held   Chronic normocytic anemia Hemoglobin 11.8, baseline 11-13.  No signs of bleeding.  Continue to monitor labs.  No indication for blood transfusion at this time.  Informed by the patient's daughter that patient is okay with receiving blood transfusions during this hospitalization if needed.   Hypertension: Blood pressure stable. Hyperlipidemia CAD: Patient is not  endorsing any anginal symptoms. PVD GERD/PUD Pharmacy med rec pending.  DVT prophylaxis: SCDs Start: 05/01/23 2229    Code Status: Full Code   Disposition Plan:  Level of care: Telemetry Medical Status is: Inpatient Remains inpatient appropriate     Consultants:  ID Hand surgery   Subjective: No SOB, no CP  Objective: Vitals:   05/02/23 0450 05/02/23 0500 05/02/23 0545 05/02/23 0743  BP: 120/60  123/60 110/62  Pulse:   (!) 106 92  Resp: 19  18 16   Temp: (!) 101.1 F (38.4 C) (!) 100.8 F (38.2 C) 100.2 F (37.9 C) 98 F (36.7 C)  TempSrc: Oral Oral Oral   SpO2: 95%  96% 96%  Weight:      Height:        Intake/Output Summary (Last 24 hours) at 05/02/2023 1225 Last data filed at 05/02/2023 0600 Gross per 24 hour  Intake 571.18 ml  Output 350 ml  Net 221.18 ml   Filed Weights   05/01/23 2300  Weight: 82.1 kg    Examination:   General: Appearance:     Overweight male in no acute distress     Lungs:     respirations unlabored  Heart:    Normal heart rate.     MS:   Amputation of left foot is noted.     Neurologic:   Awake, alert       Data Reviewed: I have personally reviewed following labs and imaging studies  CBC: Recent Labs  Lab 05/01/23 1429 05/02/23 0329  WBC 13.2* 11.8*  NEUTROABS 10.4*  --  HGB 11.8* 14.1  HCT 35.9* 41.9  MCV 88.0 87.5  PLT 214 208   Basic Metabolic Panel: Recent Labs  Lab 05/01/23 1429 05/02/23 0329  NA 130* 132*  K 4.4 3.9  CL 94* 99  CO2 20* 19*  GLUCOSE 209* 215*  BUN 33* 32*  CREATININE 1.70* 1.65*  CALCIUM 8.3* 8.6*   GFR: Estimated Creatinine Clearance: 37.9 mL/min (A) (by C-G formula based on SCr of 1.65 mg/dL (H)). Liver Function Tests: Recent Labs  Lab 05/01/23 1429  AST 27  ALT 21  ALKPHOS 154*  BILITOT 0.9  PROT 6.6  ALBUMIN 2.9*   No results for input(s): "LIPASE", "AMYLASE" in the last 168 hours. No results for input(s): "AMMONIA" in the last 168 hours. Coagulation  Profile: No results for input(s): "INR", "PROTIME" in the last 168 hours. Cardiac Enzymes: No results for input(s): "CKTOTAL", "CKMB", "CKMBINDEX", "TROPONINI" in the last 168 hours. BNP (last 3 results) No results for input(s): "PROBNP" in the last 8760 hours. HbA1C: Recent Labs    05/01/23 1429  HGBA1C 8.7*   CBG: Recent Labs  Lab 05/02/23 0046 05/02/23 0742 05/02/23 1154  GLUCAP 234* 190* 219*   Lipid Profile: No results for input(s): "CHOL", "HDL", "LDLCALC", "TRIG", "CHOLHDL", "LDLDIRECT" in the last 72 hours. Thyroid Function Tests: No results for input(s): "TSH", "T4TOTAL", "FREET4", "T3FREE", "THYROIDAB" in the last 72 hours. Anemia Panel: No results for input(s): "VITAMINB12", "FOLATE", "FERRITIN", "TIBC", "IRON", "RETICCTPCT" in the last 72 hours. Sepsis Labs: Recent Labs  Lab 05/01/23 1440  LATICACIDVEN 1.1    Recent Results (from the past 240 hour(s))  Blood culture (routine x 2)     Status: None (Preliminary result)   Collection Time: 05/01/23  8:23 PM   Specimen: BLOOD RIGHT ARM  Result Value Ref Range Status   Specimen Description BLOOD RIGHT ARM  Final   Special Requests   Final    BOTTLES DRAWN AEROBIC AND ANAEROBIC Blood Culture results may not be optimal due to an excessive volume of blood received in culture bottles   Culture   Final    NO GROWTH < 12 HOURS Performed at [facility], [address] [place], [place] [address]    Report Status PENDING  Incomplete  Blood culture (routine x 2)     Status: None (Preliminary result)   Collection Time: 05/01/23  8:28 PM   Specimen: BLOOD LEFT ARM  Result Value Ref Range Status   Specimen Description BLOOD LEFT ARM  Final   Special Requests   Final    BOTTLES DRAWN AEROBIC AND ANAEROBIC Blood Culture results may not be optimal due to an excessive volume of blood received in culture bottles   Culture   Final    NO GROWTH < 12 HOURS Performed at [facility], [address],  [place], [place] [address]    Report Status PENDING  Incomplete         Radiology Studies: DG Finger Index Left  Result Date: 05/01/2023 CLINICAL DATA:  Left index finger pain and swelling with clinical symptoms of cellulitis. Diabetes. EXAM: LEFT INDEX FINGER 2+V COMPARISON:  None Available. FINDINGS: Marked diffuse soft tissue swelling of the index finger with dorsal soft tissue irregularity distally with overlying bandage material. Bone destruction and fragmentation involving the distal aspect of the 2nd distal phalanx. 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 Extensive erosive and  cystic changes involving all of the carpals, bases of the metacarpals and distal radius and ulna. Are also radiocarpal degenerative changes and distal radius and ulna fragmentation. IMPRESSION: 1. Osteomyelitis involving the distal aspect of the 2nd distal phalanx with dorsal soft tissue ulceration/irregularity. 2. 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 Electronically Signed   By: Beckie Salts M.D.   On: 05/01/2023 16:38        Scheduled Meds:  insulin aspart  0-5 Units Subcutaneous QHS   insulin aspart  0-9 Units Subcutaneous TID WC   Continuous Infusions:  piperacillin-tazobactam (ZOSYN)  IV 3.375 g (05/02/23 0422)   vancomycin       LOS: 1 day    Time spent: 45 minutes spent on chart review, discussion with nursing staff, consultants, updating family and interview/physical exam; more than 50% of that time was spent in counseling and/or coordination of care.    Joseph Art, DO [facility] Available via Epic secure chat 7am-7pm After these hours, please refer to coverage provider listed on [email or web address] 05/02/2023, 12:25 PM

## 2023-05-02 NOTE — Care Plan (Signed)
Patient unable to sign consent for surgery. Patient consented to daughter Christian [name]) at bedside signing consent form.

## 2023-05-02 NOTE — H&P (View-Only) (Signed)
ORTHOPAEDIC CONSULTATION HISTORY & PHYSICAL REQUESTING PHYSICIAN: Joseph Art, DO  Chief Complaint: Left index finger infection  HPI: Christian [name] is a 82 y.o. male who has multiple medical problems, including being hearing impaired, who presented to the emergency room yesterday with a history of a developing process in the left index finger.  There was a wound at the tip of the finger that was mechanically picked, etc. over the course of the last few months.  The finger has become progressively more swollen and developed necrotic tissue at the tip and around the nail, with drainage.  He is diabetic and has undergone transmetatarsal amputation of the foot previously.  32  Past Medical History:  Diagnosis Date   Angina pectoris [facility])    Nuclear stress test 8/18: EF 59, fixed inf-lat defect likely attenuation, no ischemia, Low Risk   Arthritis    CMT (Charcot-Marie-Tooth disease)    Deafness    Diverticulosis    DM (diabetes mellitus) (HCC)    HLD (hyperlipidemia)    HTN (hypertension)    Iron deficiency anemia    Multiple gastric ulcers    Overactive bladder    Ulcer    Past Surgical History:  Procedure Laterality Date   COLONOSCOPY N/A 08/27/2013   Procedure: COLONOSCOPY;  Surgeon: Beverley Fiedler, MD;  Location: [facility] ENDOSCOPY;  Service: Gastroenterology;  Laterality: N/A;   ESOPHAGOGASTRODUODENOSCOPY N/A 08/27/2013   Procedure: ESOPHAGOGASTRODUODENOSCOPY (EGD);  Surgeon: Beverley Fiedler, MD;  Location: [facility];  Service: Gastroenterology;  Laterality: N/A;   FOOT AMPUTATION THROUGH METATARSAL Left    FOOT SURGERY     age 56   TOTAL HIP ARTHROPLASTY Bilateral 2002, 2003   TOTAL KNEE ARTHROPLASTY Bilateral 1999   Social History   Socioeconomic History   Marital status: Married    Spouse name: Not on file   Number of children: 4   Years of education: Not on file   Highest education level: Not on file  Occupational History   Occupation: retired Counsellor  Tobacco Use    Smoking status: Never   Smokeless tobacco: Never  Substance and Sexual Activity   Alcohol use: No   Drug use: No   Sexual activity: Not on file  Other Topics Concern   Not on file  Social History Narrative   Not on file   Social Determinants of Health   Financial Resource Strain: Not on file  Food Insecurity: Not on file  Transportation Needs: Not on file  Physical Activity: Not on file  Stress: Not on file  Social Connections: Not on file   Family History  Problem Relation Age of Onset   Cervical cancer Mother        mets   Breast cancer Sister        mets   Diabetes Sister    Diabetes Brother    Heart disease Sister    Cervical cancer Sister    Allergies  Allergen Reactions   Lipitor [Atorvastatin] Other (See Comments)    unknown   Prior to Admission medications   Medication Sig Start Date End Date Taking? Authorizing Provider  ACCU-CHEK SMARTVIEW test strip USE 1 STRIP TO CHECK GLUCOSE TWICE DAILY AS DIRECTED 02/15/20   [provider]  albuterol (VENTOLIN HFA) 108 (90 Base) MCG/ACT inhaler Inhale into the lungs. 07/13/21   [provider]  amLODipine (NORVASC) 10 MG tablet TAKE 1 TABLET BY MOUTH DAILY 02/20/23   [name], MD  BREO ELLIPTA 200-25 MCG/ACT AEPB  1 puff daily. 08/19/21   [provider]  busPIRone (BUSPAR) 7.5 MG tablet Take 7.5 mg by mouth 2 (two) times daily. 11/03/21   [provider]  diclofenac (VOLTAREN) 50 MG EC tablet Take 1 tablet by mouth 2 (two) times daily as needed. 01/10/22   [provider]  escitalopram (LEXAPRO) 5 MG tablet Take 5 mg by mouth daily. 01/24/21   [provider]  furosemide (LASIX) 20 MG tablet TAKE 1 TABLET BY MOUTH EVERY DAY 01/12/22   Asencion Islam, DPM  isosorbide mononitrate (IMDUR) 60 MG 24 hr tablet Take 1 tablet (60 mg total) by mouth daily. Please make overdue appt with Dr. Eldridge Dace before anymore refills. Thank you 3rd and Final Attempt 09/26/21   [name], MD  ketoconazole (NIZORAL) 2 % cream Apply topically. 09/14/21   [provider]  linezolid (ZYVOX) 600 MG tablet Take 1 tablet (600 mg total) by mouth 2 (two) times daily. 06/21/21   Stover, Cassandria Anger, DPM  LORazepam (ATIVAN) 1 MG tablet Take 1 mg by mouth daily as needed for anxiety or sleep. 01/22/20   [provider]  Multiple Vitamin (MULTIVITAMIN) capsule Take 1 capsule by mouth daily.    [provider]  mupirocin cream (BACTROBAN) 2 % APPLY 1 APPLICATION TOPICALLY  TWICE DAILY 06/02/22   [name], NP  nitroGLYCERIN (NITROSTAT) 0.4 MG SL tablet Place 1 tablet (0.4 mg total) under the tongue every 5 (five) minutes as needed for chest pain. Please keep upcoming appt in May with Dr. Eldridge Dace. Thank you 01/09/20   [name], MD  polyethylene glycol (MIRALAX / GLYCOLAX) 17 g packet Take 17 g by mouth daily as needed for mild constipation. 03/16/21   Rolly Salter, MD  polyethylene glycol powder (GLYCOLAX/MIRALAX) 17 GM/SCOOP powder Take by mouth.    [provider]  pravastatin (PRAVACHOL) 40 MG tablet Take 40 mg by mouth daily. 12/25/19   [provider]  promethazine-dextromethorphan (PROMETHAZINE-DM) 6.25-15 MG/5ML syrup SMARTSIG:5-10 Milliliter(s) By Mouth Every 6 Hours PRN 09/14/21   [provider]  solifenacin (VESICARE) 5 MG tablet Take 5 mg by mouth daily. 11/09/21   [provider]  sulfamethoxazole-trimethoprim (BACTRIM) 400-80 MG tablet Take 1 tablet by mouth 2 (two) times daily. 10/18/21   Asencion Islam, DPM  tamsulosin (FLOMAX) 0.4 MG CAPS capsule Take 0.4 mg by mouth in the morning and at bedtime. 11/10/20   [provider]  tiZANidine (ZANAFLEX) 4 MG tablet Take by mouth. 04/07/21   [provider]  pantoprazole (PROTONIX) 40 MG tablet Take 1 tablet (40 mg total) by mouth daily. Patient not taking: Reported on 03/12/2021 12/27/17 03/13/21  Beverley Fiedler, MD   DG Finger Index Left  Result  Date: 05/01/2023 CLINICAL DATA:  Left index finger pain and swelling with clinical symptoms of cellulitis. Diabetes. EXAM: LEFT INDEX FINGER 2+V COMPARISON:  None Available. FINDINGS: Marked diffuse soft tissue swelling of the index finger with dorsal soft tissue irregularity distally with overlying bandage material. Bone destruction and fragmentation involving the distal aspect of the 2nd distal phalanx. 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 Extensive erosive and cystic changes involving all of the carpals, bases of the metacarpals and distal radius and ulna. Are also radiocarpal degenerative changes and distal radius and ulna fragmentation. IMPRESSION: 1. Osteomyelitis involving the distal aspect of the 2nd distal phalanx with dorsal soft  tissue ulceration/irregularity. 2. 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 Electronically Signed   By: Beckie Salts M.D.   On: 05/01/2023 16:38    Positive ROS: All other systems have been reviewed and were otherwise negative with the exception of those mentioned in the HPI and as above.  Physical Exam: Vitals: Refer to EMR. Constitutional:  WD, WN, NAD HEENT:  NCAT, EOMI Neuro/Psych:  Alert & oriented  Lymphatic: No generalized extremity edema or lymphadenopathy Extremities / MSK:    Left index finger markedly swollen to the base, with necrotic tissue at the tip covered by a Band-Aid.  Drainage is evident.  The changes are largely confined to the digit, and do not extend greatly into the hand  Assessment: Left index finger deep infection, with osteomyelitis of the distal phalanx  Plan: I discussed these findings with him with the use of a sign language interpreter.  Surgical source control has been recommended by infectious diseases as an integral part of his treatment plan.  We will plan on left index finger  amputation, likely at the MP joint given the extent of the changes and the low likelihood that any remnant of index finger would provide any functional advantages for him.  He is presently on tomorrow's surgical add-on list for the late afternoon.  I discussed this with the patient today and obtained his consent with the use of the sign language interpreter.  Cliffton Asters Janee Morn, MD      Orthopaedic & Hand Surgery [facility]opaedic [facility] [address] [place], [place]  [address] Office: [phone]  05/02/2023, 2:20 PM

## 2023-05-02 NOTE — Consult Note (Signed)
ORTHOPAEDIC CONSULTATION HISTORY & PHYSICAL REQUESTING PHYSICIAN: Joseph Art, DO  Chief Complaint: Left index finger infection  HPI: Christian [name] is a 82 y.o. male who has multiple medical problems, including being hearing impaired, who presented to the emergency room yesterday with a history of a developing process in the left index finger.  There was a wound at the tip of the finger that was mechanically picked, etc. over the course of the last few months.  The finger has become progressively more swollen and developed necrotic tissue at the tip and around the nail, with drainage.  He is diabetic and has undergone transmetatarsal amputation of the foot previously.  32  Past Medical History:  Diagnosis Date   Angina pectoris [facility])    Nuclear stress test 8/18: EF 59, fixed inf-lat defect likely attenuation, no ischemia, Low Risk   Arthritis    CMT (Charcot-Marie-Tooth disease)    Deafness    Diverticulosis    DM (diabetes mellitus) (HCC)    HLD (hyperlipidemia)    HTN (hypertension)    Iron deficiency anemia    Multiple gastric ulcers    Overactive bladder    Ulcer    Past Surgical History:  Procedure Laterality Date   COLONOSCOPY N/A 08/27/2013   Procedure: COLONOSCOPY;  Surgeon: Beverley Fiedler, MD;  Location: [facility] ENDOSCOPY;  Service: Gastroenterology;  Laterality: N/A;   ESOPHAGOGASTRODUODENOSCOPY N/A 08/27/2013   Procedure: ESOPHAGOGASTRODUODENOSCOPY (EGD);  Surgeon: Beverley Fiedler, MD;  Location: [facility];  Service: Gastroenterology;  Laterality: N/A;   FOOT AMPUTATION THROUGH METATARSAL Left    FOOT SURGERY     age 56   TOTAL HIP ARTHROPLASTY Bilateral 2002, 2003   TOTAL KNEE ARTHROPLASTY Bilateral 1999   Social History   Socioeconomic History   Marital status: Married    Spouse name: Not on file   Number of children: 4   Years of education: Not on file   Highest education level: Not on file  Occupational History   Occupation: retired Counsellor  Tobacco Use    Smoking status: Never   Smokeless tobacco: Never  Substance and Sexual Activity   Alcohol use: No   Drug use: No   Sexual activity: Not on file  Other Topics Concern   Not on file  Social History Narrative   Not on file   Social Determinants of Health   Financial Resource Strain: Not on file  Food Insecurity: Not on file  Transportation Needs: Not on file  Physical Activity: Not on file  Stress: Not on file  Social Connections: Not on file   Family History  Problem Relation Age of Onset   Cervical cancer Mother        mets   Breast cancer Sister        mets   Diabetes Sister    Diabetes Brother    Heart disease Sister    Cervical cancer Sister    Allergies  Allergen Reactions   Lipitor [Atorvastatin] Other (See Comments)    unknown   Prior to Admission medications   Medication Sig Start Date End Date Taking? Authorizing Provider  ACCU-CHEK SMARTVIEW test strip USE 1 STRIP TO CHECK GLUCOSE TWICE DAILY AS DIRECTED 02/15/20   [provider]  albuterol (VENTOLIN HFA) 108 (90 Base) MCG/ACT inhaler Inhale into the lungs. 07/13/21   [provider]  amLODipine (NORVASC) 10 MG tablet TAKE 1 TABLET BY MOUTH DAILY 02/20/23   [name], MD  BREO ELLIPTA 200-25 MCG/ACT AEPB  1 puff daily. 08/19/21   [provider]  busPIRone (BUSPAR) 7.5 MG tablet Take 7.5 mg by mouth 2 (two) times daily. 11/03/21   [provider]  diclofenac (VOLTAREN) 50 MG EC tablet Take 1 tablet by mouth 2 (two) times daily as needed. 01/10/22   [provider]  escitalopram (LEXAPRO) 5 MG tablet Take 5 mg by mouth daily. 01/24/21   [provider]  furosemide (LASIX) 20 MG tablet TAKE 1 TABLET BY MOUTH EVERY DAY 01/12/22   Asencion Islam, DPM  isosorbide mononitrate (IMDUR) 60 MG 24 hr tablet Take 1 tablet (60 mg total) by mouth daily. Please make overdue appt with Dr. Eldridge Dace before anymore refills. Thank you 3rd and Final Attempt 09/26/21   [name], MD  ketoconazole (NIZORAL) 2 % cream Apply topically. 09/14/21   [provider]  linezolid (ZYVOX) 600 MG tablet Take 1 tablet (600 mg total) by mouth 2 (two) times daily. 06/21/21   Stover, Cassandria Anger, DPM  LORazepam (ATIVAN) 1 MG tablet Take 1 mg by mouth daily as needed for anxiety or sleep. 01/22/20   [provider]  Multiple Vitamin (MULTIVITAMIN) capsule Take 1 capsule by mouth daily.    [provider]  mupirocin cream (BACTROBAN) 2 % APPLY 1 APPLICATION TOPICALLY  TWICE DAILY 06/02/22   [name], NP  nitroGLYCERIN (NITROSTAT) 0.4 MG SL tablet Place 1 tablet (0.4 mg total) under the tongue every 5 (five) minutes as needed for chest pain. Please keep upcoming appt in May with Dr. Eldridge Dace. Thank you 01/09/20   [name], MD  polyethylene glycol (MIRALAX / GLYCOLAX) 17 g packet Take 17 g by mouth daily as needed for mild constipation. 03/16/21   Rolly Salter, MD  polyethylene glycol powder (GLYCOLAX/MIRALAX) 17 GM/SCOOP powder Take by mouth.    [provider]  pravastatin (PRAVACHOL) 40 MG tablet Take 40 mg by mouth daily. 12/25/19   [provider]  promethazine-dextromethorphan (PROMETHAZINE-DM) 6.25-15 MG/5ML syrup SMARTSIG:5-10 Milliliter(s) By Mouth Every 6 Hours PRN 09/14/21   [provider]  solifenacin (VESICARE) 5 MG tablet Take 5 mg by mouth daily. 11/09/21   [provider]  sulfamethoxazole-trimethoprim (BACTRIM) 400-80 MG tablet Take 1 tablet by mouth 2 (two) times daily. 10/18/21   Asencion Islam, DPM  tamsulosin (FLOMAX) 0.4 MG CAPS capsule Take 0.4 mg by mouth in the morning and at bedtime. 11/10/20   [provider]  tiZANidine (ZANAFLEX) 4 MG tablet Take by mouth. 04/07/21   [provider]  pantoprazole (PROTONIX) 40 MG tablet Take 1 tablet (40 mg total) by mouth daily. Patient not taking: Reported on 03/12/2021 12/27/17 03/13/21  Beverley Fiedler, MD   DG Finger Index Left  Result  Date: 05/01/2023 CLINICAL DATA:  Left index finger pain and swelling with clinical symptoms of cellulitis. Diabetes. EXAM: LEFT INDEX FINGER 2+V COMPARISON:  None Available. FINDINGS: Marked diffuse soft tissue swelling of the index finger with dorsal soft tissue irregularity distally with overlying bandage material. Bone destruction and fragmentation involving the distal aspect of the 2nd distal phalanx. 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 Extensive erosive and cystic changes involving all of the carpals, bases of the metacarpals and distal radius and ulna. Are also radiocarpal degenerative changes and distal radius and ulna fragmentation. IMPRESSION: 1. Osteomyelitis involving the distal aspect of the 2nd distal phalanx with dorsal soft  tissue ulceration/irregularity. 2. 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 Electronically Signed   By: Beckie Salts M.D.   On: 05/01/2023 16:38    Positive ROS: All other systems have been reviewed and were otherwise negative with the exception of those mentioned in the HPI and as above.  Physical Exam: Vitals: Refer to EMR. Constitutional:  WD, WN, NAD HEENT:  NCAT, EOMI Neuro/Psych:  Alert & oriented  Lymphatic: No generalized extremity edema or lymphadenopathy Extremities / MSK:    Left index finger markedly swollen to the base, with necrotic tissue at the tip covered by a Band-Aid.  Drainage is evident.  The changes are largely confined to the digit, and do not extend greatly into the hand  Assessment: Left index finger deep infection, with osteomyelitis of the distal phalanx  Plan: I discussed these findings with him with the use of a sign language interpreter.  Surgical source control has been recommended by infectious diseases as an integral part of his treatment plan.  We will plan on left index finger  amputation, likely at the MP joint given the extent of the changes and the low likelihood that any remnant of index finger would provide any functional advantages for him.  He is presently on tomorrow's surgical add-on list for the late afternoon.  I discussed this with the patient today and obtained his consent with the use of the sign language interpreter.  Cliffton Asters Janee Morn, MD      Orthopaedic & Hand Surgery [facility] Orthopaedic & Sports Medicine [facility] [address] [place], [place]  [address] Office: [phone]  05/02/2023, 2:20 PM

## 2023-05-02 NOTE — Evaluation (Signed)
Physical Therapy Evaluation Patient Details Name: Christian [name] MRN: [id number] DOB: Jul 31, 1941 Today's Date: 05/02/2023  History of Present Illness  Pt is a 82 year old male admitted on 05/01/23 with left index finger swelling and erythema. Pt with hearing difficulties and needs sign language interpreter.  He has medical history significant of hypertension, hyperlipidemia, type 2 diabetes with peripheral neuropathy, Charcot-Marie-Tooth disease, CAD, PAD, chronic HFpEF, CKD stage IIIa, history of transmetatarsal amputation of left foot, gastric ulcers, iron deficiency anemia, GERD.  Clinical Impression  Pt presents with admitting diagnosis above. Co-treat with OT. Pt today was able to ambulate around room with RW CGA. From an overall mobility standpoint pt feels that he is at baseline however pt does report that he has about 4-5 falls a month. Recommend HHPT upon DC. Patient needs to practice stairs next session. Pt would also benefit from continued mobility with mobility specialist during acute stay.       If plan is discharge home, recommend the following: A little help with walking and/or transfers;A little help with bathing/dressing/bathroom;Assistance with cooking/housework;Assist for transportation;Help with stairs or ramp for entrance;Direct supervision/assist for medications management;Assistance with feeding   Can travel by private vehicle        Equipment Recommendations None recommended by PT  Recommendations for Other Services       Functional Status Assessment Patient has had a recent decline in their functional status and demonstrates the ability to make significant improvements in function in a reasonable and predictable amount of time.     Precautions / Restrictions Precautions Precautions: Fall Restrictions Weight Bearing Restrictions: No      Mobility  Bed Mobility Overal bed mobility: Needs Assistance Bed Mobility: Supine to Sit     Supine to sit:  Supervision          Transfers Overall transfer level: Needs assistance Equipment used: Rolling walker (2 wheels) Transfers: Sit to/from Stand Sit to Stand: Contact guard assist           General transfer comment: Cues for hand placement    Ambulation/Gait Ambulation/Gait assistance: Contact guard assist Gait Distance (Feet): 20 Feet Assistive device: Rolling walker (2 wheels) Gait Pattern/deviations: Decreased stride length, Step-through pattern, Trunk flexed Gait velocity: decreased     General Gait Details: Pt agreeable to ambulate without room. no LOB noted and pt feels that he is at his baseline for mobility.  Stairs            Wheelchair Mobility     Tilt Bed    Modified Rankin (Stroke Patients Only)       Balance Overall balance assessment: Mild deficits observed, not formally tested                                           Pertinent Vitals/Pain Pain Assessment Pain Assessment: 0-10 Pain Score: 3  Pain Location: L finger Pain Descriptors / Indicators: Grimacing, Discomfort Pain Intervention(s): Monitored during session, Limited activity within patient's tolerance    Home Living Family/patient expects to be discharged to:: Private residence Living Arrangements: Other relatives Technical sales engineer) Available Help at Discharge: Family;Available PRN/intermittently Type of Home: House Home Access: Stairs to enter Entrance Stairs-Rails: Right Entrance Stairs-Number of Steps: 3   Home Layout: One level Home Equipment: Tub bench;Rollator (4 wheels);Rolling Walker (2 wheels);Hand held shower head;Grab bars - toilet;Wheelchair - manual      Prior Function Prior  Level of Function : Independent/Modified Independent;Driving             Mobility Comments: Pt reports he mostly uses rollator ADLs Comments: Pt reports ind     Extremity/Trunk Assessment   Upper Extremity Assessment Upper Extremity Assessment: LUE deficits/detail LUE  Deficits / Details: L index finger swelling and osteomyelitis    Lower Extremity Assessment Lower Extremity Assessment: Overall WFL for tasks assessed    Cervical / Trunk Assessment Cervical / Trunk Assessment: Normal  Communication   Communication Communication: Hearing impairment (In person interpreter used) Cueing Techniques: Verbal cues;Tactile cues  Cognition Arousal: Alert Behavior During Therapy: WFL for tasks assessed/performed Overall Cognitive Status: Within Functional Limits for tasks assessed                                          General Comments General comments (skin integrity, edema, etc.): VSS. L finger is weeping    Exercises     Assessment/Plan    PT Assessment Patient needs continued PT services  PT Problem List Decreased strength;Decreased range of motion;Decreased activity tolerance;Decreased balance;Decreased mobility;Decreased coordination;Decreased knowledge of use of DME;Decreased safety awareness;Pain;Decreased skin integrity       PT Treatment Interventions DME instruction;Gait training;Stair training;Functional mobility training;Therapeutic activities;Therapeutic exercise;Balance training;Neuromuscular re-education;Patient/family education    PT Goals (Current goals can be found in the Care Plan section)       Frequency Min 1X/week     Co-evaluation               AM-PAC PT "6 Clicks" Mobility  Outcome Measure Help needed turning from your back to your side while in a flat bed without using bedrails?: A Little Help needed moving from lying on your back to sitting on the side of a flat bed without using bedrails?: A Little Help needed moving to and from a bed to a chair (including a wheelchair)?: A Little Help needed standing up from a chair using your arms (e.g., wheelchair or bedside chair)?: A Little Help needed to walk in hospital room?: A Little Help needed climbing 3-5 steps with a railing? : A Lot 6 Click  Score: 17    End of Session Equipment Utilized During Treatment: Gait belt Activity Tolerance: Patient tolerated treatment well Patient left: in chair;with call bell/phone within reach;with chair alarm set;with nursing/sitter in room Nurse Communication: Mobility status PT Visit Diagnosis: Other abnormalities of gait and mobility (R26.89)    Time: 1610-9604 PT Time Calculation (min) (ACUTE ONLY): 34 min   Charges:   PT Evaluation $PT Eval Moderate Complexity: 1 Mod   PT General Charges $$ ACUTE PT VISIT: 1 Visit         Shela Nevin, PT, DPT Acute Rehab Services [phone]   Gladys Damme 05/02/2023, 1:26 PM

## 2023-05-02 NOTE — Plan of Care (Signed)

## 2023-05-02 NOTE — Evaluation (Signed)
Occupational Therapy Evaluation Patient Details Name: Christian [name] MRN: [id number] DOB: 02/28/1941 Today's Date: 05/02/2023   History of Present Illness Pt is a 82 year old male admitted on 05/01/23 with left index finger swelling and erythema. Pt with hearing difficulties and needs sign language interpreter.  He has medical history significant of hypertension, hyperlipidemia, type 2 diabetes with peripheral neuropathy, Charcot-Marie-Tooth disease, CAD, PAD, chronic HFpEF, CKD stage IIIa, history of transmetatarsal amputation of left foot, gastric ulcers, iron deficiency anemia, GERD.   Clinical Impression   Pt lives with his grandson and grandson's wife who can provide intermittent assist. He walks with a rollator, but reports 4-5 falls per month. He has a L orthotic shoe which is not at the hospital this date. Pt is mod I in self care and drives. Pt uses a sock aid and has a specific technique for starting pants over his feet and donning shoes when he is at home. Pt can prepare simple meals with the microwave. He uses a tub transfer bench when showering. 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 Provided foam build ups for eating utensils to simulate those he has at home. He needs CGA with RW to ambulate in his room and set up to max assist in this environment for ADLs. Recommending HHOT upon discharge.       If plan is discharge home, recommend the following: A little help with walking and/or transfers;Assistance with cooking/housework;A little help with bathing/dressing/bathroom;Help with stairs or ramp for entrance    Functional Status Assessment  Patient has had a recent decline in their functional status and demonstrates the ability to make significant improvements in function in a reasonable and predictable amount of time.  Equipment Recommendations  None recommended by OT    Recommendations for Other Services        Precautions / Restrictions Precautions Precautions: Fall Precaution Comments: reports 4-5 falls a month Required Braces or Orthoses: Other Brace Other Brace: has an orthotic shoe for the L foot, not at hospital Restrictions Weight Bearing Restrictions: No      Mobility Bed Mobility Overal bed mobility: Needs Assistance Bed Mobility: Supine to Sit     Supine to sit: Supervision     General bed mobility comments: HOB up    Transfers Overall transfer level: Needs assistance Equipment used: Rolling walker (2 wheels) Transfers: Sit to/from Stand Sit to Stand: Contact guard assist           General transfer comment: Cues for hand placement      Balance Overall balance assessment: Mild deficits observed, not formally tested                                         ADL either performed or assessed with clinical judgement   ADL Overall ADL's : Needs assistance/impaired Eating/Feeding: Set up;Sitting Eating/Feeding Details (indicate cue type and reason): assist to open containers, provided foam build ups for utensils Grooming: Set up;Sitting   Upper Body Bathing: Minimal assistance;Sitting   Lower Body Bathing: Minimal assistance;Sit to/from stand   Upper Body Dressing : Set up;Sitting   Lower Body Dressing: Maximal assistance;Sit to/from stand Lower Body Dressing Details (indicate cue type and reason): uses AE or specific technique at home to don socks and pants Toilet Transfer: Contact guard assist;Ambulation;Rolling walker (2 wheels)  Functional mobility during ADLs: Contact guard assist;Rolling walker (2 wheels)       Vision Baseline Vision/History: 1 Wears glasses Ability to See in Adequate Light: 0 Adequate Patient Visual Report: No change from baseline Additional Comments: reading glasses     Perception         Praxis         Pertinent Vitals/Pain Pain Assessment Pain Assessment: 0-10 Pain Score: 3  Pain  Location: L first finger Pain Descriptors / Indicators: Grimacing, Discomfort Pain Intervention(s): Monitored during session, Repositioned     Extremity/Trunk Assessment Upper Extremity Assessment Upper Extremity Assessment: Right hand dominant;RUE deficits/detail;LUE deficits/detail RUE Deficits / Details: FF to 100 degrees, limited finger extension pt reports due to CMT RUE Sensation: decreased light touch RUE Coordination: decreased fine motor LUE Deficits / Details: L index finger swelling and osteomyelitis, shoulder FF to 100 degrees, limited finger extension, pt reports from CMT LUE Sensation: decreased light touch LUE Coordination: decreased fine motor   Lower Extremity Assessment Lower Extremity Assessment: Defer to PT evaluation   Cervical / Trunk Assessment Cervical / Trunk Assessment: Kyphotic;Other exceptions (with forward head)   Communication Communication Communication: Hearing impairment;Other (comment) (in person interpreter) Cueing Techniques: Verbal cues;Tactile cues   Cognition Arousal: Alert Behavior During Therapy: WFL for tasks assessed/performed Overall Cognitive Status: Within Functional Limits for tasks assessed                                       General Comments  VSS. L finger is weeping    Exercises     Shoulder Instructions      Home Living Family/patient expects to be discharged to:: Private residence Living Arrangements: Other relatives (grandson and his wife) Available Help at Discharge: Family;Available PRN/intermittently Type of Home: House Home Access: Stairs to enter [employer] of Steps: 3 Entrance Stairs-Rails: Right Home Layout: One level     Bathroom Shower/Tub: Chief Strategy Officer: Standard     Home Equipment: Tub bench;Rollator (4 wheels);Rolling Walker (2 wheels);Hand held shower head;Grab bars - toilet;Wheelchair - Civil engineer, contracting: Sock aid         Prior Functioning/Environment Prior Level of Function : Independent/Modified Independent;Driving             Mobility Comments: Pt reports he mostly uses rollator ADLs Comments: mod I in self care, makes light meals with microwave, has mobile meals delivered, uses a sock aid, has a certain technique for starting pants over feet        OT Problem List: Impaired balance (sitting and/or standing);Decreased coordination;Decreased knowledge of use of DME or AE;Pain;Impaired UE functional use      OT Treatment/Interventions: Self-care/ADL training;DME and/or AE instruction;Therapeutic activities;Patient/family education;Balance training    OT Goals(Current goals can be found in the care plan section) Acute Rehab OT Goals OT Goal Formulation: With patient Time For Goal Achievement: 05/16/23 Potential to Achieve Goals: Good ADL Goals Pt Will Perform Grooming: with modified independence;standing Pt Will Perform Upper Body Bathing: with modified independence;sitting Pt Will Perform Upper Body Dressing: with modified independence;sitting Pt Will Transfer to Toilet: with modified independence;ambulating;regular height toilet Pt Will Perform Toileting - Clothing Manipulation and hygiene: with modified independence;sit to/from stand  OT Frequency: Min 1X/week    Co-evaluation              AM-PAC OT "6 Clicks" Daily Activity     Outcome  Measure Help from another person eating meals?: A Little Help from another person taking care of personal grooming?: A Little Help from another person toileting, which includes using toliet, bedpan, or urinal?: A Little Help from another person bathing (including washing, rinsing, drying)?: A Lot Help from another person to put on and taking off regular upper body clothing?: A Little Help from another person to put on and taking off regular lower body clothing?: A Lot 6 Click Score: 16   End of Session Equipment Utilized During Treatment:  Rolling walker (2 wheels);Gait belt Nurse Communication: Mobility status;Other (comment) (foam build ups for utensils)  Activity Tolerance: Patient tolerated treatment well Patient left: in chair;with call bell/phone within reach;with bed alarm set  OT Visit Diagnosis: Unsteadiness on feet (R26.81);Other abnormalities of gait and mobility (R26.89);Pain;Muscle weakness (generalized) (M62.81);History of falling (Z91.81)                Time: 1240-1309 OT Time Calculation (min): 29 min Charges:  OT General Charges $OT Visit: 1 Visit OT Evaluation $OT Eval Moderate Complexity: 1 Mod Berna Spare, OTR/L Acute Rehabilitation Services Office: [phone]   Evern Bio 05/02/2023, 2:05 PM

## 2023-05-02 NOTE — Care Management Important Message (Signed)
Important Message  Patient Details  Name: Christian [name] MRN: [id number] Date of Birth: 1940-09-18   Medicare Important Message Given:  Yes  Pt is hearing impaired and read the IMM Letter and acknowledged the letter. Patient's finger is infected and swollen, unable to use pen to sign document.   Sherilyn Banker 05/02/2023, 1:43 PM

## 2023-05-02 NOTE — Consult Note (Signed)
[facility]    Date of Admission:  05/01/2023     Total days of antibiotics 2               Reason for Consult: Osteomyelitis    Referring Provider: Dr. Benjamine Mola Primary Care Provider: Montez Hageman, DO   ASSESSMENT:  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 Agree with Orthopedics assessment that surgical intervention will necessary to resolve this infection as antibiotics independently is not likely to be successful. Discussed extent of surgical intervention will need to be determined by Orthopedics. Fortunately blood cultures have remained negative to this point. Continue with broad spectrum coverage with vancomycin and cefepime. Dressing placed on finger. Remaining medical and supportive care per Internal Medicine.  PLAN:  Continue broad spectrum coverage with vancomycin and cefepime.  Surgical intervention per Orthopedics. Monitor blood cultures.  Therapeutic drug monitoring of renal function and vancomycin levels per protocol.  Remaining medical and supportive care per Internal Medicine.    I have personally spent 33 minutes involved in face-to-face and non-face-to-face activities for this patient on the day of the visit. Professional time spent includes the following activities: Preparing to see the patient (review of tests), Obtaining and/or reviewing separately obtained history (admission/discharge record), Performing a medically appropriate examination and/or evaluation , Ordering medications/tests/procedures, referring and communicating with other health care professionals, Documenting clinical information in the EMR, Independently interpreting results (not separately reported), Communicating results to the patient/family/caregiver, Counseling and educating the patient/family/caregiver and Care coordination (not separately reported).   Principal Problem:   Osteomyelitis  (HCC) Active Problems:   Mixed hyperlipidemia   Essential hypertension, benign   Normocytic anemia   Type 2 diabetes mellitus (HCC)   Chronic kidney disease, stage III (moderate) (HCC)   Metabolic acidosis   Hyponatremia   Chronic heart failure with preserved ejection fraction (HFpEF) (HCC)    insulin aspart  0-15 Units Subcutaneous TID WC   insulin aspart  0-5 Units Subcutaneous QHS     HPI: Christian [name] is a 82 y.o. male with previous medical history as detailed below presenting with redness and swelling of the left second finger. [name] is hard of hearing and primary method of communication is through sign language with a medical interpreter present to aid in communication.   Mr. Olbrich was walking to his laundry room and almost fell catching himself and noted a pop in his left second finger with swelling and redness. Had previous redness and swelling secondary picking at his nail which was now worsened with trauma. Presents to the ED with worsening redness and swelling. X-ray left second finger with osteomyelitis of the 2nd distal phalnax with soft tissue ulceration/irregularity. Started on broad spectrum coverage with vancomycin and pip/tazo which has been narrowed to cefepime. Orthopedics consulted and seemed unlikely infection could be successfully treated without amputation and requesting ID opinion.    Review of Systems: Review of Systems  Constitutional:  Negative for chills, fever and weight loss.  Respiratory:  Negative for cough, shortness of breath and wheezing.   Cardiovascular:  Negative for chest pain and leg swelling.  Gastrointestinal:  Negative for abdominal pain, constipation, diarrhea, nausea and vomiting.  Skin:  Negative for rash.     Past Medical History:  Diagnosis Date   Angina pectoris [facility])    Nuclear stress test 8/18: EF 59, fixed inf-lat defect likely attenuation, no ischemia, Low Risk  Arthritis    CMT (Charcot-Marie-Tooth disease)     Deafness    Diverticulosis    DM (diabetes mellitus) (HCC)    HLD (hyperlipidemia)    HTN (hypertension)    Iron deficiency anemia    Multiple gastric ulcers    Overactive bladder    Ulcer     Social History   Tobacco Use   Smoking status: Never   Smokeless tobacco: Never  Substance Use Topics   Alcohol use: No   Drug use: No    Family History  Problem Relation Age of Onset   Cervical cancer Mother        mets   Breast cancer Sister        mets   Diabetes Sister    Diabetes Brother    Heart disease Sister    Cervical cancer Sister     Allergies  Allergen Reactions   Lipitor [Atorvastatin] Other (See Comments)    unknown    OBJECTIVE: Blood pressure 110/62, pulse 92, temperature 98 F (36.7 C), resp. rate 16, height 5\' 8"  (1.727 m), weight 82.1 kg, SpO2 96%.  Physical Exam Constitutional:      General: He is not in acute distress.    Appearance: He is well-developed.  Cardiovascular:     Rate and Rhythm: Normal rate and regular rhythm.     Heart sounds: Normal heart sounds.  Pulmonary:     Effort: Pulmonary effort is normal.     Breath sounds: Normal breath sounds.  Musculoskeletal:     Comments: Left second finger with significant erythema, edema, and purulent drainage from the dorsal aspect. Range of motion limited secondary to swelling.   Skin:    General: Skin is warm and dry.  Neurological:     Mental Status: He is alert and oriented to person, place, and time.  Psychiatric:        Mood and Affect: Mood normal.     Lab Results Lab Results  Component Value Date   WBC 11.8 (H) 05/02/2023   HGB 14.1 05/02/2023   HCT 41.9 05/02/2023   MCV 87.5 05/02/2023   PLT 208 05/02/2023    Lab Results  Component Value Date   CREATININE 1.65 (H) 05/02/2023   BUN 32 (H) 05/02/2023   NA 132 (L) 05/02/2023   K 3.9 05/02/2023   CL 99 05/02/2023   CO2 19 (L) 05/02/2023    Lab Results  Component Value Date   ALT 21 05/01/2023   AST 27 05/01/2023    ALKPHOS 154 (H) 05/01/2023   BILITOT 0.9 05/01/2023     Microbiology: Recent Results (from the past 240 hour(s))  Blood culture (routine x 2)     Status: None (Preliminary result)   Collection Time: 05/01/23  8:23 PM   Specimen: BLOOD RIGHT ARM  Result Value Ref Range Status   Specimen Description BLOOD RIGHT ARM  Final   Special Requests   Final    BOTTLES DRAWN AEROBIC AND ANAEROBIC Blood Culture results may not be optimal due to an excessive volume of blood received in culture bottles   Culture   Final    NO GROWTH < 12 HOURS Performed at [facility], [address], [place], [place] [address]    Report Status PENDING  Incomplete  Blood culture (routine x 2)     Status: None (Preliminary result)   Collection Time: 05/01/23  8:28 PM   Specimen: BLOOD LEFT ARM  Result Value Ref Range Status  Specimen Description BLOOD LEFT ARM  Final   Special Requests   Final    BOTTLES DRAWN AEROBIC AND ANAEROBIC Blood Culture results may not be optimal due to an excessive volume of blood received in culture bottles   Culture   Final    NO GROWTH < 12 HOURS Performed at [facility], [address], [place], [place] [address]    Report Status PENDING  Incomplete     Marcos Eke, NP [facility]  05/02/2023  3:16 PM

## 2023-05-03 ENCOUNTER — Encounter (HOSPITAL_COMMUNITY): Payer: Self-pay | Admitting: Internal Medicine

## 2023-05-03 ENCOUNTER — Other Ambulatory Visit: Payer: Self-pay

## 2023-05-03 ENCOUNTER — Inpatient Hospital Stay (HOSPITAL_COMMUNITY): Payer: Medicare Other

## 2023-05-03 ENCOUNTER — Inpatient Hospital Stay (HOSPITAL_COMMUNITY): Payer: Medicare Other | Admitting: Anesthesiology

## 2023-05-03 ENCOUNTER — Encounter (HOSPITAL_COMMUNITY)
Admission: EM | Disposition: A | Discharge status: Skilled Nursing Facility | Payer: Self-pay | Source: Home / Self Care | Attending: Internal Medicine

## 2023-05-03 DIAGNOSIS — M869 Osteomyelitis, unspecified: Secondary | ICD-10-CM | POA: Diagnosis not present

## 2023-05-03 DIAGNOSIS — R7881 Bacteremia: Secondary | ICD-10-CM

## 2023-05-03 HISTORY — PX: AMPUTATION: SHX166

## 2023-05-03 LAB — ECHOCARDIOGRAM COMPLETE
AR max vel: 2.32 cm2
AV Peak grad: 9.2 mmHg
Ao pk vel: 1.52 m/s
Area-P 1/2: 3.21 cm2
Height: 68 in
MV VTI: 1.55 cm2
S' Lateral: 2.9 cm
Weight: 2895.96 [oz_av]

## 2023-05-03 LAB — GLUCOSE, CAPILLARY
Glucose-Capillary: 149 mg/dL — ABNORMAL HIGH (ref 70–99)
Glucose-Capillary: 126 mg/dL — ABNORMAL HIGH (ref 70–99)
Glucose-Capillary: 142 mg/dL — ABNORMAL HIGH (ref 70–99)
Glucose-Capillary: 129 mg/dL — ABNORMAL HIGH (ref 70–99)
Glucose-Capillary: 235 mg/dL — ABNORMAL HIGH (ref 70–99)

## 2023-05-03 LAB — CBC
HCT: 36.6 % — ABNORMAL LOW (ref 39.0–52.0)
Hemoglobin: 12.2 g/dL — ABNORMAL LOW (ref 13.0–17.0)
MCH: 29.9 pg (ref 26.0–34.0)
MCHC: 33.3 g/dL (ref 30.0–36.0)
MCV: 89.7 fL (ref 80.0–100.0)
Platelets: 223 10*3/uL (ref 150–400)
RBC: 4.08 MIL/uL — ABNORMAL LOW (ref 4.22–5.81)
RDW: 13.5 % (ref 11.5–15.5)
WBC: 11.6 10*3/uL — ABNORMAL HIGH (ref 4.0–10.5)
nRBC: 0 % (ref 0.0–0.2)

## 2023-05-03 LAB — BASIC METABOLIC PANEL
Anion gap: 10 (ref 5–15)
BUN: 29 mg/dL — ABNORMAL HIGH (ref 8–23)
CO2: 21 mmol/L — ABNORMAL LOW (ref 22–32)
Calcium: 8.1 mg/dL — ABNORMAL LOW (ref 8.9–10.3)
Chloride: 104 mmol/L (ref 98–111)
Creatinine, Ser: 1.7 mg/dL — ABNORMAL HIGH (ref 0.61–1.24)
GFR, Estimated: 41 mL/min — ABNORMAL LOW (ref >60)
Glucose, Bld: 147 mg/dL — ABNORMAL HIGH (ref 70–99)
Potassium: 3.8 mmol/L (ref 3.5–5.1)
Sodium: 135 mmol/L (ref 135–145)

## 2023-05-03 SURGERY — AMPUTATION DIGIT
Anesthesia: General | Laterality: Left

## 2023-05-03 MED ORDER — ONDANSETRON HCL 4 MG/2ML IJ SOLN
INTRAMUSCULAR | Status: DC | PRN
  Administered 2023-05-03: 4 mg via INTRAVENOUS

## 2023-05-03 MED ORDER — CHLORHEXIDINE GLUCONATE 0.12 % MT SOLN
OROMUCOSAL | Status: AC
  Filled 2023-05-03: qty 15

## 2023-05-03 MED ORDER — BUPIVACAINE-EPINEPHRINE 0.5% -1:200000 IJ SOLN
INTRAMUSCULAR | Status: DC | PRN
  Administered 2023-05-03: 10 mL

## 2023-05-03 MED ORDER — PHENYLEPHRINE 80 MCG/ML (10ML) SYRINGE FOR IV PUSH (FOR BLOOD PRESSURE SUPPORT)
PREFILLED_SYRINGE | INTRAVENOUS | Status: DC | PRN
  Administered 2023-05-03: 80 ug via INTRAVENOUS
  Administered 2023-05-03: 40 ug via INTRAVENOUS
  Administered 2023-05-03: 80 ug via INTRAVENOUS

## 2023-05-03 MED ORDER — OXYCODONE HCL 5 MG PO TABS: 5.0000 mg | ORAL_TABLET | Freq: Once | ORAL | Status: DC | PRN

## 2023-05-03 MED ORDER — LIDOCAINE 2% (20 MG/ML) 5 ML SYRINGE
INTRAMUSCULAR | Status: DC | PRN
  Administered 2023-05-03: 60 mg via INTRAVENOUS

## 2023-05-03 MED ORDER — FENTANYL CITRATE (PF) 250 MCG/5ML IJ SOLN
INTRAMUSCULAR | Status: AC
  Filled 2023-05-03: qty 5

## 2023-05-03 MED ORDER — FENTANYL CITRATE (PF) 100 MCG/2ML IJ SOLN
25.0000 ug | INTRAMUSCULAR | Status: DC | PRN
  Administered 2023-05-03: 25 ug via INTRAVENOUS

## 2023-05-03 MED ORDER — FENTANYL CITRATE (PF) 100 MCG/2ML IJ SOLN
INTRAMUSCULAR | Status: AC
  Filled 2023-05-03: qty 2

## 2023-05-03 MED ORDER — LIDOCAINE HCL 1 % IJ SOLN
INTRAMUSCULAR | Status: AC
  Filled 2023-05-03: qty 20

## 2023-05-03 MED ORDER — PROPOFOL 10 MG/ML IV BOLUS
INTRAVENOUS | Status: AC
  Filled 2023-05-03: qty 20

## 2023-05-03 MED ORDER — ONDANSETRON HCL 4 MG/2ML IJ SOLN: 4.0000 mg | Freq: Four times a day (QID) | INTRAMUSCULAR | Status: DC | PRN

## 2023-05-03 MED ORDER — PROPOFOL 10 MG/ML IV BOLUS
INTRAVENOUS | Status: DC | PRN
  Administered 2023-05-03: 100 mg via INTRAVENOUS

## 2023-05-03 MED ORDER — BUPIVACAINE-EPINEPHRINE (PF) 0.5% -1:200000 IJ SOLN
INTRAMUSCULAR | Status: AC
  Filled 2023-05-03: qty 30

## 2023-05-03 MED ORDER — FENTANYL CITRATE (PF) 250 MCG/5ML IJ SOLN
INTRAMUSCULAR | Status: DC | PRN
  Administered 2023-05-03: 50 ug via INTRAVENOUS
  Administered 2023-05-03: 25 ug via INTRAVENOUS
  Administered 2023-05-03: 50 ug via INTRAVENOUS

## 2023-05-03 MED ORDER — OXYCODONE HCL 5 MG/5ML PO SOLN: 5.0000 mg | Freq: Once | ORAL | Status: DC | PRN

## 2023-05-03 SURGICAL SUPPLY — 42 items
APL PRP STRL LF DISP 70% ISPRP (MISCELLANEOUS) ×1
BAG COUNTER SPONGE SURGICOUNT (BAG) ×1 IMPLANT
BAG SPNG CNTER NS LX DISP (BAG) ×1
BLADE AVERAGE 25X9 (BLADE) IMPLANT
BNDG CMPR 5X2 CHSV 1 LYR STRL (GAUZE/BANDAGES/DRESSINGS)
BNDG CMPR 75X21 PLY HI ABS (MISCELLANEOUS)
BNDG CMPR 9X4 STRL LF SNTH (GAUZE/BANDAGES/DRESSINGS)
BNDG COHESIVE 2X5 TAN ST LF (GAUZE/BANDAGES/DRESSINGS) IMPLANT
BNDG COHESIVE 4X5 TAN STRL (GAUZE/BANDAGES/DRESSINGS) IMPLANT
BNDG ELASTIC 2X5.8 VLCR STR LF (GAUZE/BANDAGES/DRESSINGS) IMPLANT
BNDG ESMARK 4X9 LF (GAUZE/BANDAGES/DRESSINGS) IMPLANT
BNDG GAUZE DERMACEA FLUFF 4 (GAUZE/BANDAGES/DRESSINGS) ×2 IMPLANT
BNDG GZE 12X3 1 PLY HI ABS (GAUZE/BANDAGES/DRESSINGS) ×1
BNDG GZE DERMACEA 4 6PLY (GAUZE/BANDAGES/DRESSINGS) ×2
BNDG STRETCH GAUZE 3IN X12FT (GAUZE/BANDAGES/DRESSINGS) ×1 IMPLANT
CHLORAPREP W/TINT 26 (MISCELLANEOUS) ×1 IMPLANT
CORD BIPOLAR FORCEPS 12FT (ELECTRODE) ×1 IMPLANT
DRAPE SURG 17X23 STRL (DRAPES) ×1 IMPLANT
DRSG EMULSION OIL 3X3 NADH (GAUZE/BANDAGES/DRESSINGS) ×1 IMPLANT
ELECT REM PT RETURN 9FT ADLT (ELECTROSURGICAL) ×1
ELECTRODE REM PT RTRN 9FT ADLT (ELECTROSURGICAL) ×1 IMPLANT
GAUZE SPONGE 4X4 12PLY STRL (GAUZE/BANDAGES/DRESSINGS) ×1 IMPLANT
GAUZE STRETCH 2X75IN STRL (MISCELLANEOUS) IMPLANT
GAUZE XEROFORM 1X8 LF (GAUZE/BANDAGES/DRESSINGS) ×1 IMPLANT
GLOVE BIO SURGEON STRL SZ7.5 (GLOVE) ×1 IMPLANT
GLOVE BIOGEL PI IND STRL 8 (GLOVE) ×1 IMPLANT
GOWN STRL REUS W/ TWL XL LVL3 (GOWN DISPOSABLE) ×1 IMPLANT
GOWN STRL REUS W/TWL XL LVL3 (GOWN DISPOSABLE) ×1
KIT BASIN OR (CUSTOM PROCEDURE TRAY) ×1 IMPLANT
NDL HYPO 25X1 1.5 SAFETY (NEEDLE) IMPLANT
NEEDLE HYPO 25X1 1.5 SAFETY (NEEDLE) IMPLANT
PACK ORTHO EXTREMITY (CUSTOM PROCEDURE TRAY) ×1 IMPLANT
PAD CAST 4YDX4 CTTN HI CHSV (CAST SUPPLIES) IMPLANT
PADDING CAST ABS COTTON 4X4 ST (CAST SUPPLIES) ×1 IMPLANT
PADDING CAST COTTON 4X4 STRL (CAST SUPPLIES)
PENCIL BUTTON HOLSTER BLD 10FT (ELECTRODE) IMPLANT
SUT ETHILON 4 0 PS 2 18 (SUTURE) IMPLANT
SUT MNCRL AB 4-0 PS2 18 (SUTURE) IMPLANT
SUT VICRYL RAPIDE 4/0 PS 2 (SUTURE) IMPLANT
SYR 10ML LL (SYRINGE) IMPLANT
TOWEL GREEN STERILE FF (TOWEL DISPOSABLE) ×1 IMPLANT
UNDERPAD 30X36 HEAVY ABSORB (UNDERPADS AND DIAPERS) ×1 IMPLANT

## 2023-05-03 NOTE — Progress Notes (Signed)
[facility] Pre-op Review  Pre-op checklist: completed  NPO: since MN 8/22  Labs: WNL CBG 149 @0751  8/22 BUN/Cr elevated but trending  Consent: order in place, asked RN to complete  H&P: 8/21  Vitals: WNL  O2 requirements: 91% RA  MAR/PTA review: completed Albuterol PRN 0609 8/22 Vancomycin q24 LD 8/21 2321  IV: 20g RUA  Floor nurse name:  Harvie Heck RN 5N  Additional info: pt deaf and will need sign language interpreter. This has been arranged

## 2023-05-03 NOTE — Anesthesia Postprocedure Evaluation (Signed)
Anesthesia Post Note  Patient: Christian [name]  Procedure(s) Performed: LEFT INDEX FINGER AMPUTATION (Left)     Patient location during evaluation: PACU Anesthesia Type: General Level of consciousness: awake and alert Pain management: pain level controlled Vital Signs Assessment: post-procedure vital signs reviewed and stable Respiratory status: spontaneous breathing, nonlabored ventilation, respiratory function stable and patient connected to nasal cannula oxygen Cardiovascular status: blood pressure returned to baseline and stable Postop Assessment: no apparent nausea or vomiting Anesthetic complications: no   No notable events documented.  Last Vitals:  Vitals:   05/03/23 1716 05/03/23 1730  BP: (!) 173/71 (!) 123/58  Pulse: 73 77  Resp: 17 13  Temp: 36.5 C   SpO2: 91% 95%    Last Pain:  Vitals:   05/03/23 1716  TempSrc:   PainSc: 0-No pain                 Lalisa Kiehn S

## 2023-05-03 NOTE — Anesthesia Preprocedure Evaluation (Signed)
Anesthesia Evaluation  Patient identified by MRN, date of birth, ID band Patient awake    Reviewed: Allergy & Precautions, H&P , NPO status , Patient's Chart, lab work & pertinent test results  Airway Mallampati: II   Neck ROM: full    Dental   Pulmonary neg pulmonary ROS   breath sounds clear to auscultation       Cardiovascular hypertension, + angina  + Peripheral Vascular Disease   Rhythm:regular Rate:Normal  TTE: EF 60-65%   Neuro/Psych   Anxiety      Neuromuscular disease    GI/Hepatic PUD,GERD  ,,  Endo/Other  diabetes, Type 2    Renal/GU      Musculoskeletal  (+) Arthritis ,    Abdominal   Peds  Hematology   Anesthesia Other Findings   Reproductive/Obstetrics                             Anesthesia Physical Anesthesia Plan  ASA: 3  Anesthesia Plan: General   Post-op Pain Management:    Induction: Intravenous  PONV Risk Score and Plan: 2 and Ondansetron, Dexamethasone and Treatment may vary due to age or medical condition  Airway Management Planned: LMA  Additional Equipment:   Intra-op Plan:   Post-operative Plan: Extubation in OR  Informed Consent: I have reviewed the patients History and Physical, chart, labs and discussed the procedure including the risks, benefits and alternatives for the proposed anesthesia with the patient or authorized representative who has indicated his/her understanding and acceptance.     Dental advisory given  Plan Discussed with: CRNA, Anesthesiologist and Surgeon  Anesthesia Plan Comments:        Anesthesia Quick Evaluation

## 2023-05-03 NOTE — Progress Notes (Signed)
OT Cancellation Note  Patient Details Name: Christian [name] MRN: [id number] DOB: Apr 16, 1941   Cancelled Treatment:    Reason Eval/Treat Not Completed: Patient at procedure or test/ unavailable  Evern Bio 05/03/2023, 3:33 PM Berna Spare, OTR/L Acute Rehabilitation Services Office: [phone]

## 2023-05-03 NOTE — Anesthesia Procedure Notes (Signed)
Procedure Name: LMA Insertion Date/Time: 05/03/2023 4:30 PM  Performed by: Achille Rich, MDPre-anesthesia Checklist: Patient identified, Emergency Drugs available, Suction available and Patient being monitored Patient Re-evaluated:Patient Re-evaluated prior to induction Oxygen Delivery Method: Circle system utilized Preoxygenation: Pre-oxygenation with 100% oxygen Induction Type: IV induction Ventilation: Mask ventilation without difficulty LMA: LMA inserted LMA Size: 4.0 Number of attempts: 1 Tube secured with: Tape Dental Injury: Teeth and Oropharynx as per pre-operative assessment

## 2023-05-03 NOTE — Op Note (Signed)
05/01/2023 - 05/03/2023  4:07 PM  PATIENT:  Christian [name]  82 y.o. 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 Janee Morn, MD  PHYSICIAN ASSISTANT: None  ANESTHESIA: General  SPECIMENS: Deep surgical swabs to microbiology  DRAINS:   None  EBL: Less than 10 mL  PREOPERATIVE INDICATIONS:  Christian [name] is a  82 y.o. 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  OPERATIVE IMPLANTS: None  OPERATIVE PROCEDURE:  After receiving scheduled antibiotics, the patient was escorted to the operative theatre and placed in a supine position.  General anesthesia was administered.  A surgical "time-out" was performed during which the planned procedure, proposed operative site, and the correct patient identity were compared to the operative consent and agreement confirmed by the circulating nurse according to current facility policy.  Following application of a tourniquet to the operative extremity, the exposed skin was prescrubbed with a Hibiclens scrub brush before being formally prepped with beta 9 and draped in the usual sterile fashion.  The limb was exsanguinated with an Esmarch bandage and the tourniquet inflated to approximately higher than systolic BP.  A circular incision was made around the base of the index finger, just distal to the level of the proximal digital crease, ending on the radial side with a slight racquet shape.  Purulence was encountered in the subcutaneous plane and cultures were  obtained.  Skin and soft tissues were incised sharply all the way down to bone.  The blade was then placed into the MP joint and the capsule incised so that the digit could be passed off.  The flexor tendons were then pulled as far distally as possible and transected proximally.  All of the flexor sheath as far as could be reached was excised.  The collateral ligaments were excised.  The extensor apparatus was excised as far proximally as could be accessed.  The digital nerves were isolated, dissected free and divided well proximal to the wound edge.  Wound was copiously irrigated.  Tourniquet was released some additional hemostasis obtained with electrocautery and the flaps loosely inset with 4-0 Vicryl Rapide interrupted suture.  A dressing was applied and he was taken to the recovery room in stable condition, breathing spontaneously  DISPOSITION: The patient will be returned to the floor, to continue multidisciplinary care with a goal at resolving his infection and bacteremia.  I remain available for additional surgical measures as may be needed for source control.

## 2023-05-03 NOTE — Progress Notes (Signed)
PROGRESS NOTE    Christian [name]  ZOX:[id number] DOB: 26-Aug-1941 DOA: 05/01/2023 PCP: Montez Hageman, DO    Brief Narrative:  Christian [name] is a 82 y.o. male with hearing difficulties and needs sign language interpreter.  He has medical history significant of hypertension, hyperlipidemia, type 2 diabetes with peripheral neuropathy, Charcot-Marie-Tooth disease, CAD, PAD, chronic HFpEF, CKD stage IIIa, history of transmetatarsal amputation of left foot, gastric ulcers, iron deficiency anemia, GERD presenting with left index finger swelling and erythema.  Found to have staph bacteremia.   Assessment and Plan: staph bacteremia and Left index finger osteomyelitis -Continue IV antibiotics (vancomycin).   -Blood cultures positive-- repeat per ID.   -OR planned for possible amputation on 8/22   CKD stage IIIa Mild metabolic acidosis Creatinine 1.7, baseline 1.3-1.5.  Bicarb 20.   -LABS STILL PENDING ON 8/22.   Mild hyponatremia -trending up   Fall at home Patient denies head injury or loss of consciousness.  No other injuries reported.  PT/OT eval, fall precautions.   Type 2 diabetes -SSI   Chronic HFpEF Echo done in September 2023 showing EF 60 to 65%, grade 2 diastolic dysfunction.  No signs of volume overload at this time.  Lasix has been held   Chronic normocytic anemia Hemoglobin 11.8, baseline 11-13.  No signs of bleeding.  Continue to monitor labs.  No indication for blood transfusion at this time.  Informed by the patient's daughter that patient is okay with receiving blood transfusions during this hospitalization if needed.   Hypertension: Blood pressure stable. Hyperlipidemia CAD: Patient is not endorsing any anginal symptoms. PVD GERD/PUD Pharmacy med rec pending.  DVT prophylaxis: SCDs Start: 05/01/23 2229    Code Status: Full Code   Disposition Plan:  Level of care: Telemetry Medical Status is: Inpatient Remains inpatient appropriate   Called all 4  emergency contacts-- only Kevin Fenton answered  Consultants:  ID Hand surgery   Subjective: Seen with in person interpreter- overall feeling better  Objective: Vitals:   05/02/23 0743 05/02/23 2000 05/03/23 0600 05/03/23 0752  BP: 110/62 (!) 118/59 136/69 129/64  Pulse: 92 84 87 86  Resp: 16 18 19 16   Temp: 98 F (36.7 C) 98.9 F (37.2 C) 97.9 F (36.6 C) 97.9 F (36.6 C)  TempSrc:  Oral Oral   SpO2: 96% 95% 96% 91%  Weight:      Height:        Intake/Output Summary (Last 24 hours) at 05/03/2023 0950 Last data filed at 05/03/2023 0300 Gross per 24 hour  Intake 661.03 ml  Output --  Net 661.03 ml   Filed Weights   05/01/23 2300  Weight: 82.1 kg    Examination:   General: Appearance:     Overweight male in no acute distress     Lungs:     respirations unlabored  Heart:    Normal heart rate.     MS:       Neurologic:   Awake, alert       Data Reviewed: I have personally reviewed following labs and imaging studies  CBC: Recent Labs  Lab 05/01/23 1429 05/02/23 0329  WBC 13.2* 11.8*  NEUTROABS 10.4*  --   HGB 11.8* 14.1  HCT 35.9* 41.9  MCV 88.0 87.5  PLT 214 208   Basic Metabolic Panel: Recent Labs  Lab 05/01/23 1429 05/02/23 0329  NA 130* 132*  K 4.4 3.9  CL 94* 99  CO2 20* 19*  GLUCOSE 209* 215*  BUN 33*  32*  CREATININE 1.70* 1.65*  CALCIUM 8.3* 8.6*   GFR: Estimated Creatinine Clearance: 37.9 mL/min (A) (by C-G formula based on SCr of 1.65 mg/dL (H)). Liver Function Tests: Recent Labs  Lab 05/01/23 1429  AST 27  ALT 21  ALKPHOS 154*  BILITOT 0.9  PROT 6.6  ALBUMIN 2.9*   No results for input(s): "LIPASE", "AMYLASE" in the last 168 hours. No results for input(s): "AMMONIA" in the last 168 hours. Coagulation Profile: No results for input(s): "INR", "PROTIME" in the last 168 hours. Cardiac Enzymes: No results for input(s): "CKTOTAL", "CKMB", "CKMBINDEX", "TROPONINI" in the last 168 hours. BNP (last 3 results) No results for  input(s): "PROBNP" in the last 8760 hours. HbA1C: Recent Labs    05/01/23 1429  HGBA1C 8.7*   CBG: Recent Labs  Lab 05/02/23 0742 05/02/23 1154 05/02/23 1602 05/02/23 2037 05/03/23 0751  GLUCAP 190* 219* 326* 109* 149*   Lipid Profile: No results for input(s): "CHOL", "HDL", "LDLCALC", "TRIG", "CHOLHDL", "LDLDIRECT" in the last 72 hours. Thyroid Function Tests: No results for input(s): "TSH", "T4TOTAL", "FREET4", "T3FREE", "THYROIDAB" in the last 72 hours. Anemia Panel: No results for input(s): "VITAMINB12", "FOLATE", "FERRITIN", "TIBC", "IRON", "RETICCTPCT" in the last 72 hours. Sepsis Labs: Recent Labs  Lab 05/01/23 1440  LATICACIDVEN 1.1    Recent Results (from the past 240 hour(s))  Blood culture (routine x 2)     Status: None (Preliminary result)   Collection Time: 05/01/23  8:23 PM   Specimen: BLOOD RIGHT ARM  Result Value Ref Range Status   Specimen Description BLOOD RIGHT ARM  Final   Special Requests   Final    BOTTLES DRAWN AEROBIC AND ANAEROBIC Blood Culture results may not be optimal due to an excessive volume of blood received in culture bottles   Culture   Final    NO GROWTH 2 DAYS Performed at [facility], [address] [place], [place] [address]    Report Status PENDING  Incomplete  Blood culture (routine x 2)     Status: Abnormal (Preliminary result)   Collection Time: 05/01/23  8:28 PM   Specimen: BLOOD LEFT ARM  Result Value Ref Range Status   Specimen Description BLOOD LEFT ARM  Final   Special Requests   Final    BOTTLES DRAWN AEROBIC AND ANAEROBIC Blood Culture results may not be optimal due to an excessive volume of blood received in culture bottles   Culture  Setup Time   Final    GRAM POSITIVE COCCI IN CLUSTERS IN BOTH AEROBIC AND ANAEROBIC BOTTLES CRITICAL RESULT CALLED TO, READ BACK BY AND VERIFIED WITH: Neta Mends [id number] @ 2129 FH    Culture (A)  Final    STAPHYLOCOCCUS AUREUS SUSCEPTIBILITIES TO FOLLOW Performed  at [facility], [address] [place], [place] [address]    Report Status PENDING  Incomplete  Blood Culture ID Panel (Reflexed)     Status: Abnormal   Collection Time: 05/01/23  8:28 PM  Result Value Ref Range Status   Enterococcus faecalis NOT DETECTED NOT DETECTED Final   Enterococcus Faecium NOT DETECTED NOT DETECTED Final   Listeria monocytogenes NOT DETECTED NOT DETECTED Final   Staphylococcus species DETECTED (A) NOT DETECTED Final    Comment: CRITICAL RESULT CALLED TO, READ BACK BY AND VERIFIED WITH: PHARMD Talmadge Chad [id number] @ 2129 FH    Staphylococcus aureus (BCID) DETECTED (A) NOT DETECTED Final    Comment: Methicillin (oxacillin)-resistant Staphylococcus aureus (MRSA). MRSA is predictably resistant  to beta-lactam antibiotics (except ceftaroline). Preferred therapy is vancomycin unless clinically contraindicated. Patient requires contact precautions if  hospitalized. CRITICAL RESULT CALLED TO, READ BACK BY AND VERIFIED WITH: Neta Mends [id number] @ 2129 FH    Staphylococcus epidermidis NOT DETECTED NOT DETECTED Final   Staphylococcus lugdunensis NOT DETECTED NOT DETECTED Final   Streptococcus species NOT DETECTED NOT DETECTED Final   Streptococcus agalactiae NOT DETECTED NOT DETECTED Final   Streptococcus pneumoniae NOT DETECTED NOT DETECTED Final   Streptococcus pyogenes NOT DETECTED NOT DETECTED Final   A.calcoaceticus-baumannii NOT DETECTED NOT DETECTED Final   Bacteroides fragilis NOT DETECTED NOT DETECTED Final   Enterobacterales NOT DETECTED NOT DETECTED Final   Enterobacter cloacae complex NOT DETECTED NOT DETECTED Final   Escherichia coli NOT DETECTED NOT DETECTED Final   Klebsiella aerogenes NOT DETECTED NOT DETECTED Final   Klebsiella oxytoca NOT DETECTED NOT DETECTED Final   Klebsiella pneumoniae NOT DETECTED NOT DETECTED Final   Proteus species NOT DETECTED NOT DETECTED Final   Salmonella species NOT DETECTED NOT DETECTED Final   Serratia  marcescens NOT DETECTED NOT DETECTED Final   Haemophilus influenzae NOT DETECTED NOT DETECTED Final   Neisseria meningitidis NOT DETECTED NOT DETECTED Final   Pseudomonas aeruginosa NOT DETECTED NOT DETECTED Final   Stenotrophomonas maltophilia NOT DETECTED NOT DETECTED Final   Candida albicans NOT DETECTED NOT DETECTED Final   Candida auris NOT DETECTED NOT DETECTED Final   Candida glabrata NOT DETECTED NOT DETECTED Final   Candida krusei NOT DETECTED NOT DETECTED Final   Candida parapsilosis NOT DETECTED NOT DETECTED Final   Candida tropicalis NOT DETECTED NOT DETECTED Final   Cryptococcus neoformans/gattii NOT DETECTED NOT DETECTED Final   Meth resistant mecA/C and MREJ DETECTED (A) NOT DETECTED Final    Comment: CRITICAL RESULT CALLED TO, READ BACK BY AND VERIFIED WITH: Neta Mends [id number] @ 2129 FH Performed at [facility], [address] [place], [place] [address]   Culture, blood (Routine X 2) w Reflex to ID Panel     Status: None (Preliminary result)   Collection Time: 05/02/23 11:09 PM   Specimen: BLOOD  Result Value Ref Range Status   Specimen Description BLOOD BLOOD RIGHT HAND  Final   Special Requests   Final    BOTTLES DRAWN AEROBIC AND ANAEROBIC Blood Culture results may not be optimal due to an inadequate volume of blood received in culture bottles   Culture   Final    NO GROWTH < 12 HOURS Performed at [facility], [address], [place], [place] [address]    Report Status PENDING  Incomplete  Culture, blood (Routine X 2) w Reflex to ID Panel     Status: None (Preliminary result)   Collection Time: 05/02/23 11:09 PM   Specimen: BLOOD  Result Value Ref Range Status   Specimen Description BLOOD BLOOD RIGHT HAND  Final   Special Requests   Final    BOTTLES DRAWN AEROBIC AND ANAEROBIC Blood Culture adequate volume   Culture   Final    NO GROWTH < 12 HOURS Performed at [facility], [address] [place], [place] [address]    Report  Status PENDING  Incomplete         Radiology Studies: DG Finger Index Left  Result Date: 05/01/2023 CLINICAL DATA:  Left index finger pain and swelling with clinical symptoms of cellulitis. Diabetes. EXAM: LEFT INDEX FINGER 2+V COMPARISON:  None Available. FINDINGS: Marked diffuse soft tissue swelling of  the index finger with dorsal soft tissue irregularity distally with overlying bandage material. Bone destruction and fragmentation involving the distal aspect of the 2nd distal phalanx. 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 Extensive erosive and cystic changes involving all of the carpals, bases of the metacarpals and distal radius and ulna. Are also radiocarpal degenerative changes and distal radius and ulna fragmentation. IMPRESSION: 1. Osteomyelitis involving the distal aspect of the 2nd distal phalanx with dorsal soft tissue ulceration/irregularity. 2. 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 Electronically Signed   By: Beckie Salts M.D.   On: 05/01/2023 16:38        Scheduled Meds:  insulin aspart  0-15 Units Subcutaneous TID WC   insulin aspart  0-5 Units Subcutaneous QHS   Continuous Infusions:  sodium chloride 50 mL/hr at 05/02/23 1713   vancomycin 1,000 mg (05/02/23 2321)     LOS: 2 days    Time spent: 45 minutes spent on chart review, discussion with nursing staff, consultants, updating family and interview/physical exam; more than 50% of that time was spent in counseling and/or coordination of care.    Joseph Art, DO [facility] Available via Epic secure chat 7am-7pm After these hours, please refer to coverage provider listed on [email or web address] 05/03/2023, 9:50 AM

## 2023-05-03 NOTE — Progress Notes (Signed)
   05/03/23 1115  Mobility  Activity Ambulated with assistance in room  Level of Assistance Minimal assist, patient does 75% or more (+2)  Assistive Device Front wheel walker  Distance Ambulated (ft) 40 ft  Activity Response Tolerated fair  Mobility Referral Yes  $Mobility charge 1 Mobility  Mobility Specialist Start Time (ACUTE ONLY) 1045  Mobility Specialist Stop Time (ACUTE ONLY) 1110  Mobility Specialist Time Calculation (min) (ACUTE ONLY) 25 min   Mobility Specialist: Progress Note  Pt agreeable to mobility session - received in bed. ModA+2 from STS, CG during ambulation. Pt c/o leg soreness, knee pain, and feeling hot after ambulation - RN aware. Pt returned to bed with all needs met - call bell within reach.    Barnie Mort, BS Mobility Specialist Please contact via SecureChat or Rehab office at [phone].

## 2023-05-03 NOTE — Transfer of Care (Signed)
Immediate Anesthesia Transfer of Care Note  Patient: Christian [name]  Procedure(s) Performed: LEFT INDEX FINGER AMPUTATION (Left)  Patient Location: PACU  Anesthesia Type:General  Level of Consciousness: awake, alert , and patient cooperative  Airway & Oxygen Therapy: Patient Spontanous Breathing  Post-op Assessment: Report given to RN and Post -op Vital signs reviewed and stable  Post vital signs: Reviewed and stable  Last Vitals:  Vitals Value Taken Time  BP 173/71 05/03/23 1716  Temp    Pulse 77 05/03/23 1718  Resp 20 05/03/23 1718  SpO2 89 % 05/03/23 1718  Vitals shown include unfiled device data.  Last Pain:  Vitals:   05/03/23 1603  TempSrc:   PainSc: 2          Complications: No notable events documented.

## 2023-05-03 NOTE — Progress Notes (Signed)
Echocardiogram 2D Echocardiogram has been performed.  Lucendia Herrlich 05/03/2023, 12:50 PM

## 2023-05-03 NOTE — Plan of Care (Signed)
  Problem: Coping: Goal: Ability to adjust to condition or change in health will improve Outcome: Progressing   Problem: Fluid Volume: Goal: Ability to maintain a balanced intake and output will improve Outcome: Progressing   Problem: Metabolic: Goal: Ability to maintain appropriate glucose levels will improve Outcome: Progressing   

## 2023-05-03 NOTE — Interval H&P Note (Signed)
History and Physical Interval Note:  05/03/2023 4:07 PM  Christian [name]  has presented today for surgery, with the diagnosis of LEFT INDEX FINGER OSTEOMYELITIS.  The various methods of treatment have been discussed with the patient and family. After consideration of risks, benefits and other options for treatment, the patient has consented to  Procedure(s): LEFT INDEX FINGER AMPUTATION (Left) as a surgical intervention.  The patient's history has been reviewed, patient examined, no change in status, stable for surgery.  I have reviewed the patient's chart and labs.  Questions were answered to the patient's satisfaction.     [name]

## 2023-05-03 NOTE — Progress Notes (Addendum)
S/p L IF amputation thru MPJ  The zone of amputation was clearly through reddened skin with some degree of cellulitis, and there was even some subcutaneous fluid at that level.  However, it seemed the most appropriate level at this point.  If the infection spreads more proximally fails to resolve, he could require additional soft tissue debridement versus more proximal amputation.  The amputation skin flaps are reapproximated with absorbable suture, so there is no need for suture removal.  They will fall away like light scab over the next 2 to 3 weeks.  Further, there is no rehab/functional recovery need.  Dressings can be discontinued when there is no longer any active drainage.  Although the ID service will appropriately take the lead role in eradication of this infection (optimization of host factors, parenteral antibiotic management, including route, duration, associated laboratory monitoring, etc), determination of progress or need for additional tissue removal)  hopefully this procedure will assist in that effort, and I remain available as needed for any surgical questions or concerns, or if additional removal of soft tissues is thought to be needed to assist in infection resolution.  Neil Crouch, MD Hand Surgery

## 2023-05-04 ENCOUNTER — Encounter (HOSPITAL_COMMUNITY): Payer: Self-pay | Admitting: Orthopedic Surgery

## 2023-05-04 DIAGNOSIS — Z794 Long term (current) use of insulin: Secondary | ICD-10-CM | POA: Diagnosis not present

## 2023-05-04 DIAGNOSIS — R7881 Bacteremia: Secondary | ICD-10-CM | POA: Diagnosis not present

## 2023-05-04 DIAGNOSIS — E1169 Type 2 diabetes mellitus with other specified complication: Secondary | ICD-10-CM | POA: Diagnosis not present

## 2023-05-04 DIAGNOSIS — M86142 Other acute osteomyelitis, left hand: Secondary | ICD-10-CM | POA: Diagnosis not present

## 2023-05-04 LAB — BASIC METABOLIC PANEL
Anion gap: 12 (ref 5–15)
BUN: 33 mg/dL — ABNORMAL HIGH (ref 8–23)
CO2: 21 mmol/L — ABNORMAL LOW (ref 22–32)
Calcium: 8.2 mg/dL — ABNORMAL LOW (ref 8.9–10.3)
Chloride: 103 mmol/L (ref 98–111)
Creatinine, Ser: 1.73 mg/dL — ABNORMAL HIGH (ref 0.61–1.24)
GFR, Estimated: 40 mL/min — ABNORMAL LOW (ref >60)
Glucose, Bld: 160 mg/dL — ABNORMAL HIGH (ref 70–99)
Potassium: 4.3 mmol/L (ref 3.5–5.1)
Sodium: 136 mmol/L (ref 135–145)

## 2023-05-04 LAB — GLUCOSE, CAPILLARY
Glucose-Capillary: 147 mg/dL — ABNORMAL HIGH (ref 70–99)
Glucose-Capillary: 190 mg/dL — ABNORMAL HIGH (ref 70–99)
Glucose-Capillary: 241 mg/dL — ABNORMAL HIGH (ref 70–99)
Glucose-Capillary: 228 mg/dL — ABNORMAL HIGH (ref 70–99)

## 2023-05-04 LAB — CBC
HCT: 37.5 % — ABNORMAL LOW (ref 39.0–52.0)
Hemoglobin: 12.2 g/dL — ABNORMAL LOW (ref 13.0–17.0)
MCH: 29.2 pg (ref 26.0–34.0)
MCHC: 32.5 g/dL (ref 30.0–36.0)
MCV: 89.7 fL (ref 80.0–100.0)
Platelets: 258 10*3/uL (ref 150–400)
RBC: 4.18 MIL/uL — ABNORMAL LOW (ref 4.22–5.81)
RDW: 13.7 % (ref 11.5–15.5)
WBC: 9.2 10*3/uL (ref 4.0–10.5)
nRBC: 0 % (ref 0.0–0.2)

## 2023-05-04 LAB — MAGNESIUM: Magnesium: 1.8 mg/dL (ref 1.7–2.4)

## 2023-05-04 MED ORDER — SODIUM CHLORIDE 0.9 % IV SOLN
8.0000 mg/kg | Freq: Every day | INTRAVENOUS | Status: DC
  Administered 2023-05-05 – 2023-05-09 (×4): 650 mg via INTRAVENOUS
  Filled 2023-05-04 (×7): qty 13

## 2023-05-04 MED ORDER — FUROSEMIDE 20 MG PO TABS
20.0000 mg | ORAL_TABLET | Freq: Every day | ORAL | Status: DC
  Administered 2023-05-04 – 2023-05-05 (×2): 20 mg via ORAL
  Filled 2023-05-04 (×2): qty 1

## 2023-05-04 MED ORDER — ISOSORBIDE MONONITRATE ER 60 MG PO TB24
60.0000 mg | ORAL_TABLET | Freq: Every day | ORAL | Status: DC
  Administered 2023-05-04 – 2023-05-10 (×7): 60 mg via ORAL
  Filled 2023-05-04 (×7): qty 1

## 2023-05-04 MED ORDER — OXYCODONE-ACETAMINOPHEN 5-325 MG PO TABS
1.0000 | ORAL_TABLET | Freq: Once | ORAL | Status: AC
  Administered 2023-05-04: 1 via ORAL
  Filled 2023-05-04: qty 1

## 2023-05-04 MED ORDER — TAMSULOSIN HCL 0.4 MG PO CAPS
0.4000 mg | ORAL_CAPSULE | Freq: Every day | ORAL | Status: DC
  Administered 2023-05-04 – 2023-05-10 (×7): 0.4 mg via ORAL
  Filled 2023-05-04 (×7): qty 1

## 2023-05-04 MED ORDER — POTASSIUM CHLORIDE CRYS ER 20 MEQ PO TBCR: 40.0000 meq | EXTENDED_RELEASE_TABLET | Freq: Once | ORAL | Status: DC

## 2023-05-04 NOTE — Progress Notes (Signed)
PROGRESS NOTE    Christian [name]  ION:[id number] DOB: May 12, 1941 DOA: 05/01/2023 PCP: Montez Hageman, DO    Brief Narrative:  Christian [name] is a 82 y.o. male with hearing difficulties and needs sign language interpreter.  He has medical history significant of hypertension, hyperlipidemia, type 2 diabetes with peripheral neuropathy, Charcot-Marie-Tooth disease, CAD, PAD, chronic HFpEF, CKD stage IIIa, history of transmetatarsal amputation of left foot, gastric ulcers, iron deficiency anemia, GERD presenting with left index finger swelling and erythema.  Found to have staph bacteremia.   Assessment and Plan: staph bacteremia and Left index finger osteomyelitis -Continue IV antibiotics (vancomycin).   -Blood cultures positive-- repeat per ID.   -OR 8/22- s/p amputation-- may need further -TTE with thickening-- will get TEE  CKD stage IIIa Mild metabolic acidosis Creatinine 1.7, baseline 1.3-1.5.     Mild hyponatremia normalized   Fall at home Patient denies head injury or loss of consciousness.  No other injuries reported.  PT/OT eval- home health, fall precautions.   Type 2 diabetes -SSI   Chronic HFpEF Echo done in September 2023 showing EF 60 to 65%, grade 2 diastolic dysfunction.  No signs of volume overload at this time.  -resume lasix   Chronic normocytic anemia Hemoglobin 11.8, baseline 11-13.  No signs of bleeding.  Continue to monitor labs.  No indication for blood transfusion at this time.  Informed by the patient's daughter that patient is okay with receiving blood transfusions during this hospitalization if needed.   Hypertension: Blood pressure stable. Hyperlipidemia CAD: Patient is not endorsing any anginal symptoms. PVD GERD/PUD   DVT prophylaxis: SCDs Start: 05/01/23 2229    Code Status: Full Code   Disposition Plan:  Level of care: Telemetry Medical Status is: Inpatient Remains inpatient appropriate    Consultants:  ID Hand surgery Cards  (TEE)   Subjective: No current complaints  Objective: Vitals:   05/04/23 0424 05/04/23 0726 05/04/23 1212 05/04/23 1327  BP: 134/68 124/68  122/63  Pulse: 69 72  86  Resp: 14 17  18   Temp: 98.3 F (36.8 C) 98 F (36.7 C)  98.1 F (36.7 C)  TempSrc: Oral Oral  Oral  SpO2: 96% (!) 89% 97% 97%  Weight:      Height:        Intake/Output Summary (Last 24 hours) at 05/04/2023 1351 Last data filed at 05/04/2023 2440 Gross per 24 hour  Intake 440 ml  Output --  Net 440 ml   Filed Weights   05/01/23 2300  Weight: 82.1 kg    Examination:   General: Appearance:     Overweight male in no acute distress     Lungs:     respirations unlabored  Heart:    Normal heart rate.        Neurologic:   Awake, alert       Data Reviewed: I have personally reviewed following labs and imaging studies  CBC: Recent Labs  Lab 05/01/23 1429 05/02/23 0329 05/03/23 0955 05/04/23 0439  WBC 13.2* 11.8* 11.6* 9.2  NEUTROABS 10.4*  --   --   --   HGB 11.8* 14.1 12.2* 12.2*  HCT 35.9* 41.9 36.6* 37.5*  MCV 88.0 87.5 89.7 89.7  PLT 214 208 223 258   Basic Metabolic Panel: Recent Labs  Lab 05/01/23 1429 05/02/23 0329 05/03/23 0955 05/04/23 0812  NA 130* 132* 135 136  K 4.4 3.9 3.8 4.3  CL 94* 99 104 103  CO2 20* 19* 21* 21*  GLUCOSE 209* 215* 147* 160*  BUN 33* 32* 29* 33*  CREATININE 1.70* 1.65* 1.70* 1.73*  CALCIUM 8.3* 8.6* 8.1* 8.2*   GFR: Estimated Creatinine Clearance: 36.2 mL/min (A) (by C-G formula based on SCr of 1.73 mg/dL (H)). Liver Function Tests: Recent Labs  Lab 05/01/23 1429  AST 27  ALT 21  ALKPHOS 154*  BILITOT 0.9  PROT 6.6  ALBUMIN 2.9*   No results for input(s): "LIPASE", "AMYLASE" in the last 168 hours. No results for input(s): "AMMONIA" in the last 168 hours. Coagulation Profile: No results for input(s): "INR", "PROTIME" in the last 168 hours. Cardiac Enzymes: No results for input(s): "CKTOTAL", "CKMB", "CKMBINDEX", "TROPONINI" in the last  168 hours. BNP (last 3 results) No results for input(s): "PROBNP" in the last 8760 hours. HbA1C: Recent Labs    05/01/23 1429  HGBA1C 8.7*   CBG: Recent Labs  Lab 05/03/23 1514 05/03/23 1720 05/03/23 2115 05/04/23 0723 05/04/23 1120  GLUCAP 142* 129* 235* 147* 190*   Lipid Profile: No results for input(s): "CHOL", "HDL", "LDLCALC", "TRIG", "CHOLHDL", "LDLDIRECT" in the last 72 hours. Thyroid Function Tests: No results for input(s): "TSH", "T4TOTAL", "FREET4", "T3FREE", "THYROIDAB" in the last 72 hours. Anemia Panel: No results for input(s): "VITAMINB12", "FOLATE", "FERRITIN", "TIBC", "IRON", "RETICCTPCT" in the last 72 hours. Sepsis Labs: Recent Labs  Lab 05/01/23 1440  LATICACIDVEN 1.1    Recent Results (from the past 240 hour(s))  Blood culture (routine x 2)     Status: None (Preliminary result)   Collection Time: 05/01/23  8:23 PM   Specimen: BLOOD RIGHT ARM  Result Value Ref Range Status   Specimen Description BLOOD RIGHT ARM  Final   Special Requests   Final    BOTTLES DRAWN AEROBIC AND ANAEROBIC Blood Culture results may not be optimal due to an excessive volume of blood received in culture bottles   Culture   Final    NO GROWTH 3 DAYS Performed at [facility], [address], [place], [place] [address]    Report Status PENDING  Incomplete  Blood culture (routine x 2)     Status: Abnormal   Collection Time: 05/01/23  8:28 PM   Specimen: BLOOD LEFT ARM  Result Value Ref Range Status   Specimen Description BLOOD LEFT ARM  Final   Special Requests   Final    BOTTLES DRAWN AEROBIC AND ANAEROBIC Blood Culture results may not be optimal due to an excessive volume of blood received in culture bottles   Culture  Setup Time   Final    GRAM POSITIVE COCCI IN CLUSTERS IN BOTH AEROBIC AND ANAEROBIC BOTTLES CRITICAL RESULT CALLED TO, READ BACK BY AND VERIFIED WITH: Neta Mends [id number] @ 2129 FH Performed at [facility], [address]  [place], [place] [address]    Culture METHICILLIN RESISTANT STAPHYLOCOCCUS AUREUS (A)  Final   Report Status 05/04/2023 FINAL  Final   Organism ID, Bacteria METHICILLIN RESISTANT STAPHYLOCOCCUS AUREUS  Final      Susceptibility   Methicillin resistant staphylococcus aureus - MIC*    CIPROFLOXACIN >=8 RESISTANT Resistant     ERYTHROMYCIN >=8 RESISTANT Resistant     GENTAMICIN <=0.5 SENSITIVE Sensitive     OXACILLIN >=4 RESISTANT Resistant     TETRACYCLINE <=1 SENSITIVE Sensitive     VANCOMYCIN <=0.5 SENSITIVE Sensitive     TRIMETH/SULFA >=320 RESISTANT Resistant     CLINDAMYCIN <=0.25 SENSITIVE Sensitive     RIFAMPIN <=0.5 SENSITIVE Sensitive  Inducible Clindamycin NEGATIVE Sensitive     LINEZOLID 2 SENSITIVE Sensitive     * METHICILLIN RESISTANT STAPHYLOCOCCUS AUREUS  Blood Culture ID Panel (Reflexed)     Status: Abnormal   Collection Time: 05/01/23  8:28 PM  Result Value Ref Range Status   Enterococcus faecalis NOT DETECTED NOT DETECTED Final   Enterococcus Faecium NOT DETECTED NOT DETECTED Final   Listeria monocytogenes NOT DETECTED NOT DETECTED Final   Staphylococcus species DETECTED (A) NOT DETECTED Final    Comment: CRITICAL RESULT CALLED TO, READ BACK BY AND VERIFIED WITH: PHARMD Talmadge Chad [id number] @ 2129 FH    Staphylococcus aureus (BCID) DETECTED (A) NOT DETECTED Final    Comment: Methicillin (oxacillin)-resistant Staphylococcus aureus (MRSA). MRSA is predictably resistant to beta-lactam antibiotics (except ceftaroline). Preferred therapy is vancomycin unless clinically contraindicated. Patient requires contact precautions if  hospitalized. CRITICAL RESULT CALLED TO, READ BACK BY AND VERIFIED WITH: Neta Mends [id number] @ 2129 FH    Staphylococcus epidermidis NOT DETECTED NOT DETECTED Final   Staphylococcus lugdunensis NOT DETECTED NOT DETECTED Final   Streptococcus species NOT DETECTED NOT DETECTED Final   Streptococcus agalactiae NOT DETECTED NOT DETECTED Final    Streptococcus pneumoniae NOT DETECTED NOT DETECTED Final   Streptococcus pyogenes NOT DETECTED NOT DETECTED Final   A.calcoaceticus-baumannii NOT DETECTED NOT DETECTED Final   Bacteroides fragilis NOT DETECTED NOT DETECTED Final   Enterobacterales NOT DETECTED NOT DETECTED Final   Enterobacter cloacae complex NOT DETECTED NOT DETECTED Final   Escherichia coli NOT DETECTED NOT DETECTED Final   Klebsiella aerogenes NOT DETECTED NOT DETECTED Final   Klebsiella oxytoca NOT DETECTED NOT DETECTED Final   Klebsiella pneumoniae NOT DETECTED NOT DETECTED Final   Proteus species NOT DETECTED NOT DETECTED Final   Salmonella species NOT DETECTED NOT DETECTED Final   Serratia marcescens NOT DETECTED NOT DETECTED Final   Haemophilus influenzae NOT DETECTED NOT DETECTED Final   Neisseria meningitidis NOT DETECTED NOT DETECTED Final   Pseudomonas aeruginosa NOT DETECTED NOT DETECTED Final   Stenotrophomonas maltophilia NOT DETECTED NOT DETECTED Final   Candida albicans NOT DETECTED NOT DETECTED Final   Candida auris NOT DETECTED NOT DETECTED Final   Candida glabrata NOT DETECTED NOT DETECTED Final   Candida krusei NOT DETECTED NOT DETECTED Final   Candida parapsilosis NOT DETECTED NOT DETECTED Final   Candida tropicalis NOT DETECTED NOT DETECTED Final   Cryptococcus neoformans/gattii NOT DETECTED NOT DETECTED Final   Meth resistant mecA/C and MREJ DETECTED (A) NOT DETECTED Final    Comment: CRITICAL RESULT CALLED TO, READ BACK BY AND VERIFIED WITH: Neta Mends [id number] @ 2129 FH Performed at [facility], [address], [place], [place] [address]   Culture, blood (Routine X 2) w Reflex to ID Panel     Status: None (Preliminary result)   Collection Time: 05/02/23 11:09 PM   Specimen: BLOOD  Result Value Ref Range Status   Specimen Description BLOOD BLOOD RIGHT HAND  Final   Special Requests   Final    BOTTLES DRAWN AEROBIC AND ANAEROBIC Blood Culture results may not be optimal due to  an inadequate volume of blood received in culture bottles   Culture   Final    NO GROWTH 2 DAYS Performed at [facility], [address] [place], [place] [address]    Report Status PENDING  Incomplete  Culture, blood (Routine X 2) w Reflex to ID Panel     Status: None (Preliminary result)   Collection  Time: 05/02/23 11:09 PM   Specimen: BLOOD  Result Value Ref Range Status   Specimen Description BLOOD BLOOD RIGHT HAND  Final   Special Requests   Final    BOTTLES DRAWN AEROBIC AND ANAEROBIC Blood Culture adequate volume   Culture   Final    NO GROWTH 2 DAYS Performed at [facility], [address] [place], [place] [address]    Report Status PENDING  Incomplete  Aerobic/Anaerobic Culture w Gram Stain (surgical/deep wound)     Status: None (Preliminary result)   Collection Time: 05/03/23  4:10 PM   Specimen: PATH Cytology Misc. fluid; Body Fluid  Result Value Ref Range Status   Specimen Description WOUND  Final   Special Requests LEFT INDEX FINGER  Final   Gram Stain NO WBC SEEN NO ORGANISMS SEEN   Final   Culture   Final    RARE STAPHYLOCOCCUS AUREUS CULTURE REINCUBATED FOR BETTER GROWTH Performed at [facility], [address] [place], [place] [address]    Report Status PENDING  Incomplete         Radiology Studies: ECHOCARDIOGRAM COMPLETE  Result Date: 05/03/2023    ECHOCARDIOGRAM REPORT   Patient Name:   NESBIT MURRA Date of Exam: 05/03/2023 Medical Rec #:  [id number]        Height:       68.0 in Accession #:    [id number]       Weight:       181.0 lb Date of Birth:  10/09/1940       BSA:          1.959 m Patient Age:    75 years         BP:           136/69 mmHg Patient Gender: M                HR:           84 bpm. Exam Location:  Inpatient Procedure: 2D Echo, Cardiac Doppler and Color Doppler Indications:    Bacteremia R78.81  History:        Patient has prior history of Echocardiogram examinations, most                 recent 05/30/2022. CHF,  Angina; Risk Factors:Hypertension and                 Diabetes. CKD, stage 3.  Sonographer:    Lucendia Herrlich Referring Phys: [id number] CORNELIUS N VAN DAM IMPRESSIONS  1. Left ventricular ejection fraction, by estimation, is 60 to 65%. The left ventricle has normal function. The left ventricle has no regional wall motion abnormalities. Left ventricular diastolic parameters were normal.  2. Right ventricular systolic function is normal. The right ventricular size is normal. There is normal pulmonary artery systolic pressure.  3. The mitral valve is degenerative. Trivial mitral valve regurgitation. No evidence of mitral stenosis.  4. The aortic valve was not well visualized. There is mild calcification of the aortic valve. There is mild thickening of the aortic valve. Aortic valve regurgitation is not visualized. Aortic valve sclerosis is present, with no evidence of aortic valve  stenosis.  5. The inferior vena cava is dilated in size with >50% respiratory variability, suggesting right atrial pressure of 8 mmHg. Comparison(s): No significant change from prior study. Prior images reviewed side by side. Conclusion(s)/Recommendation(s): No evidence of valvular vegetations on this transthoracic echocardiogram. Consider a transesophageal echocardiogram to exclude infective  endocarditis if clinically indicated. FINDINGS  Left Ventricle: Left ventricular ejection fraction, by estimation, is 60 to 65%. The left ventricle has normal function. The left ventricle has no regional wall motion abnormalities. The left ventricular internal cavity size was normal in size. There is  no left ventricular hypertrophy. Left ventricular diastolic parameters were normal. Right Ventricle: The right ventricular size is normal. Right vetricular wall thickness was not well visualized. Right ventricular systolic function is normal. There is normal pulmonary artery systolic pressure. The tricuspid regurgitant velocity is 1.04 m/s, and with an  assumed right atrial pressure of 8 mmHg, the estimated right ventricular systolic pressure is 12.3 mmHg. Left Atrium: Left atrial size was normal in size. Right Atrium: Right atrial size was normal in size. Pericardium: There is no evidence of pericardial effusion. Mitral Valve: The mitral valve is degenerative in appearance. There is mild thickening of the mitral valve leaflet(s). There is mild calcification of the mitral valve leaflet(s). Trivial mitral valve regurgitation. No evidence of mitral valve stenosis. MV peak gradient, 8.2 mmHg. The mean mitral valve gradient is 3.5 mmHg. Tricuspid Valve: The tricuspid valve is not well visualized. Tricuspid valve regurgitation is trivial. No evidence of tricuspid stenosis. Aortic Valve: The aortic valve was not well visualized. There is mild calcification of the aortic valve. There is mild thickening of the aortic valve. Aortic valve regurgitation is not visualized. Aortic valve sclerosis is present, with no evidence of aortic valve stenosis. Aortic valve peak gradient measures 9.2 mmHg. Pulmonic Valve: The pulmonic valve was not well visualized. Pulmonic valve regurgitation is not visualized. Aorta: The aortic root is normal in size and structure. Venous: The inferior vena cava is dilated in size with greater than 50% respiratory variability, suggesting right atrial pressure of 8 mmHg. IAS/Shunts: The atrial septum is grossly normal.  LEFT VENTRICLE PLAX 2D LVIDd:         4.50 cm   Diastology LVIDs:         2.90 cm   LV e' medial:    6.84 cm/s LV PW:         1.00 cm   LV E/e' medial:  13.9 LV IVS:        0.80 cm   LV e' lateral:   11.70 cm/s LVOT diam:     2.10 cm   LV E/e' lateral: 8.1 LV SV:         67 LV SV Index:   34 LVOT Area:     3.46 cm  RIGHT VENTRICLE             IVC RV S prime:     16.60 cm/s  IVC diam: 2.80 cm TAPSE (M-mode): 2.2 cm LEFT ATRIUM             Index        RIGHT ATRIUM           Index LA diam:        2.30 cm 1.17 cm/m   RA Area:     10.50 cm  LA Vol (A2C):   37.7 ml 19.25 ml/m  RA Volume:   17.60 ml  8.98 ml/m LA Vol (A4C):   50.9 ml 25.98 ml/m LA Biplane Vol: 44.5 ml 22.72 ml/m  AORTIC VALVE AV Area (Vmax): 2.32 cm AV Vmax:        152.00 cm/s AV Peak Grad:   9.2 mmHg LVOT Vmax:      101.80 cm/s LVOT Vmean:     64.000 cm/s  LVOT VTI:       0.194 m  AORTA Ao Root diam: 3.40 cm Ao Asc diam:  2.90 cm MITRAL VALVE                TRICUSPID VALVE MV Area (PHT): 3.21 cm     TR Peak grad:   4.3 mmHg MV Area VTI:   1.55 cm     TR Vmax:        104.00 cm/s MV Peak grad:  8.2 mmHg MV Mean grad:  3.5 mmHg     SHUNTS MV Vmax:       1.44 m/s     Systemic VTI:  0.19 m MV Vmean:      81.9 cm/s    Systemic Diam: 2.10 cm MV Decel Time: 236 msec MV E velocity: 94.90 cm/s MV A velocity: 138.00 cm/s MV E/A ratio:  0.69 Jodelle Red MD Electronically signed by Jodelle Red MD Signature Date/Time: 05/03/2023/2:11:43 PM    Final         Scheduled Meds:  furosemide  20 mg Oral Daily   insulin aspart  0-15 Units Subcutaneous TID WC   insulin aspart  0-5 Units Subcutaneous QHS   isosorbide mononitrate  60 mg Oral Daily   tamsulosin  0.4 mg Oral Daily   Continuous Infusions:  [START ON 05/05/2023] DAPTOmycin (CUBICIN) 650 mg in sodium chloride 0.9 % IVPB       LOS: 3 days    Time spent: 45 minutes spent on chart review, discussion with nursing staff, consultants, updating family and interview/physical exam; more than 50% of that time was spent in counseling and/or coordination of care.    Joseph Art, DO [facility] Available via Epic secure chat 7am-7pm After these hours, please refer to coverage provider listed on [email or web address] 05/04/2023, 1:51 PM

## 2023-05-04 NOTE — Plan of Care (Signed)
  Problem: Coping: Goal: Ability to adjust to condition or change in health will improve Outcome: Progressing   Problem: Fluid Volume: Goal: Ability to maintain a balanced intake and output will improve Outcome: Progressing   Problem: Nutritional: Goal: Maintenance of adequate nutrition will improve Outcome: Progressing   

## 2023-05-04 NOTE — Progress Notes (Incomplete)
PHARMACY CONSULT NOTE FOR:  OUTPATIENT  PARENTERAL ANTIBIOTIC THERAPY (OPAT)  Indication: R-finger osteo Regimen: Daptomycin 650 mg IV every 24 hours End date: *** (6 weeks from OR 8/22)  IV antibiotic discharge orders are pended. To discharging provider:  please sign these orders via discharge navigator,  Select New Orders & click on the button choice - Manage This Unsigned Work.     Thank you for allowing pharmacy to be a part of this patient's care.  Georgina Pillion, PharmD, BCPS, BCIDP Infectious Diseases Clinical Pharmacist 05/04/2023 1:43 PM   **Pharmacist phone directory can now be found on [email or web address] ([email or web address]).  Listed under [facility].

## 2023-05-04 NOTE — Progress Notes (Signed)
[facility]  Date of Admission:  05/01/2023   Total days of inpatient antibiotics 3  Principal Problem:   Osteomyelitis (HCC) Active Problems:   Mixed hyperlipidemia   Essential hypertension, benign   Normocytic anemia   Type 2 diabetes mellitus (HCC)   Chronic kidney disease, stage III (moderate) (HCC)   Metabolic acidosis   Hyponatremia   Chronic heart failure with preserved ejection fraction (HFpEF) [facility])          Assessment: 82 year old male with hearing difficulties requires ASL interpreter, uses sign language interpreter, hypertension, hyperlipidemia, diabetes, Charcot Marie tooth disease, CAD, PAD, heart failure, CKD CT, history of TMA of left foot, gastric ulcers, IDA, GERD presented with left index finger swelling and erythema.  On arrival patient was afebrile WBC 13.3 K.  During hospitalization temp wilted to the 100.8. #MRSA bacteremia 2/2 Left index finger infection - 8/20 blood cultures 1/2 MRSA, 8/21 no growth - Taken to the OR on 8/22 for 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  Zone of  amputation reddened, some degree of cellulitis, some subcutaneous fluid at the level.  May need repeat debridement versus more proximal amputation. -TTE showed thickened aortic valve.  Will get TEE Recommendations: -D/C vancomycin, start daptomycin given CKD - Sent for daptomycin sensitivities - Follow OR plus a possible repeat debridement. - TEE ordered - Follow or cultures - Follow repeat blood cultures from 8/21 to ensure clearance - Follow surgical plan  Dr. Daiva Eves will be covering this weekend.  New ID team on next week.   Microbiology:   Antibiotics: Vancomycin 8/20-present Pip-tazo 8/20-21  Cultures: Blood 8/21/2 MRSA 8/21 no growth   Other 8/22 OR cultures growing Staph aureus, fungal and AFB pending  SUBJECTIVE: Staying in bed.  ASL translator used. Interval: Afebrile overnight.   WBC 9.2K.  Review of Systems: Review of Systems  All other systems reviewed and are negative.    Scheduled Meds:  furosemide  20 mg Oral Daily   insulin aspart  0-15 Units Subcutaneous TID WC   insulin aspart  0-5 Units Subcutaneous QHS   isosorbide mononitrate  60 mg Oral Daily   tamsulosin  0.4 mg Oral Daily   Continuous Infusions:  [START ON 05/05/2023] DAPTOmycin (CUBICIN) 650 mg in sodium chloride 0.9 % IVPB     PRN Meds:.acetaminophen **OR** acetaminophen, albuterol Allergies  Allergen Reactions   Lipitor [Atorvastatin] Other (See Comments)    unknown    OBJECTIVE: Vitals:   05/03/23 2349 05/04/23 0424 05/04/23 0726 05/04/23 1212  BP: (!) 106/57 134/68 124/68   Pulse: (!) 56 69 72   Resp: 18 14 17    Temp: 98.4 F (36.9 C) 98.3 F (36.8 C) 98 F (36.7 C)   TempSrc:  Oral Oral   SpO2: 94% 96% (!) 89% 97%  Weight:      Height:       Body mass index is 27.52 kg/m.  Physical Exam Constitutional:      General: He is not in acute distress.    Appearance: He is normal weight. He is not toxic-appearing.  HENT:     Head: Normocephalic and atraumatic.     Right Ear: External ear normal.     Left Ear: External ear normal.     Nose: No congestion or rhinorrhea.     Mouth/Throat:     Mouth: Mucous membranes are moist.     Pharynx: Oropharynx is  clear.  Eyes:     Extraocular Movements: Extraocular movements intact.     Conjunctiva/sclera: Conjunctivae normal.     Pupils: Pupils are equal, round, and reactive to light.  Cardiovascular:     Rate and Rhythm: Normal rate and regular rhythm.     Heart sounds: No murmur heard.    No friction rub. No gallop.  Pulmonary:     Effort: Pulmonary effort is normal.     Breath sounds: Normal breath sounds.  Abdominal:     General: Abdomen is flat. Bowel sounds are normal.     Palpations: Abdomen is soft.  Musculoskeletal:        General: No swelling.     Cervical back: Normal range of motion and neck supple.      Comments: Left index finger amputation  Skin:    General: Skin is warm and dry.  Neurological:     General: No focal deficit present.     Mental Status: He is oriented to person, place, and time.  Psychiatric:        Mood and Affect: Mood normal.       Lab Results Lab Results  Component Value Date   WBC 9.2 05/04/2023   HGB 12.2 (L) 05/04/2023   HCT 37.5 (L) 05/04/2023   MCV 89.7 05/04/2023   PLT 258 05/04/2023    Lab Results  Component Value Date   CREATININE 1.73 (H) 05/04/2023   BUN 33 (H) 05/04/2023   NA 136 05/04/2023   K 4.3 05/04/2023   CL 103 05/04/2023   CO2 21 (L) 05/04/2023    Lab Results  Component Value Date   ALT 21 05/01/2023   AST 27 05/01/2023   ALKPHOS 154 (H) 05/01/2023   BILITOT 0.9 05/01/2023        Danelle Earthly, MD [facility] 05/04/2023, 12:33 PM   I have personally spent 54 minutes involved in face-to-face and non-face-to-face activities for this patient on the day of the visit. Professional time spent includes the following activities: Preparing to see the patient (review of tests), Obtaining and/or reviewing separately obtained history (admission/discharge record), Performing a medically appropriate examination and/or evaluation , Ordering medications/tests/procedures, referring and communicating with other health care professionals, Documenting clinical information in the EMR, Independently interpreting results (not separately reported), Communicating results to the patient/family/caregiver, Counseling and educating the patient/family/caregiver and Care coordination (not separately reported).

## 2023-05-05 DIAGNOSIS — R7881 Bacteremia: Secondary | ICD-10-CM | POA: Diagnosis not present

## 2023-05-05 DIAGNOSIS — M869 Osteomyelitis, unspecified: Secondary | ICD-10-CM | POA: Diagnosis not present

## 2023-05-05 LAB — CBC
HCT: 34.2 % — ABNORMAL LOW (ref 39.0–52.0)
Hemoglobin: 11 g/dL — ABNORMAL LOW (ref 13.0–17.0)
MCH: 28.6 pg (ref 26.0–34.0)
MCHC: 32.2 g/dL (ref 30.0–36.0)
MCV: 89.1 fL (ref 80.0–100.0)
Platelets: 249 10*3/uL (ref 150–400)
RBC: 3.84 MIL/uL — ABNORMAL LOW (ref 4.22–5.81)
RDW: 13.5 % (ref 11.5–15.5)
WBC: 9.2 10*3/uL (ref 4.0–10.5)
nRBC: 0 % (ref 0.0–0.2)

## 2023-05-05 LAB — BASIC METABOLIC PANEL
Anion gap: 13 (ref 5–15)
BUN: 33 mg/dL — ABNORMAL HIGH (ref 8–23)
CO2: 20 mmol/L — ABNORMAL LOW (ref 22–32)
Calcium: 8 mg/dL — ABNORMAL LOW (ref 8.9–10.3)
Chloride: 102 mmol/L (ref 98–111)
Creatinine, Ser: 1.77 mg/dL — ABNORMAL HIGH (ref 0.61–1.24)
GFR, Estimated: 39 mL/min — ABNORMAL LOW (ref >60)
Glucose, Bld: 146 mg/dL — ABNORMAL HIGH (ref 70–99)
Potassium: 4.1 mmol/L (ref 3.5–5.1)
Sodium: 135 mmol/L (ref 135–145)

## 2023-05-05 LAB — CK: Total CK: 154 U/L (ref 49–397)

## 2023-05-05 LAB — GLUCOSE, CAPILLARY
Glucose-Capillary: 141 mg/dL — ABNORMAL HIGH (ref 70–99)
Glucose-Capillary: 214 mg/dL — ABNORMAL HIGH (ref 70–99)
Glucose-Capillary: 193 mg/dL — ABNORMAL HIGH (ref 70–99)
Glucose-Capillary: 269 mg/dL — ABNORMAL HIGH (ref 70–99)

## 2023-05-05 MED ORDER — LORAZEPAM 0.5 MG PO TABS
0.5000 mg | ORAL_TABLET | Freq: Four times a day (QID) | ORAL | Status: DC | PRN
  Administered 2023-05-05 – 2023-05-09 (×7): 0.5 mg via ORAL
  Filled 2023-05-05 (×7): qty 1

## 2023-05-05 MED ORDER — FUROSEMIDE 10 MG/ML IJ SOLN
40.0000 mg | Freq: Once | INTRAMUSCULAR | Status: AC
  Administered 2023-05-05: 40 mg via INTRAVENOUS
  Filled 2023-05-05: qty 4

## 2023-05-05 MED ORDER — MELATONIN 5 MG PO TABS
5.0000 mg | ORAL_TABLET | Freq: Every evening | ORAL | Status: AC | PRN
  Administered 2023-05-05 (×2): 5 mg via ORAL
  Filled 2023-05-05 (×2): qty 1

## 2023-05-05 MED ORDER — GUAIFENESIN ER 600 MG PO TB12
600.0000 mg | ORAL_TABLET | Freq: Two times a day (BID) | ORAL | Status: DC
  Administered 2023-05-05 – 2023-05-10 (×10): 600 mg via ORAL
  Filled 2023-05-05 (×10): qty 1

## 2023-05-05 NOTE — Progress Notes (Signed)
Physical Therapy Treatment Patient Details Name: Christian [name] MRN: [id number] DOB: 04/09/1941 Today's Date: 05/05/2023   History of Present Illness Pt is a 82 year old male admitted on 05/01/23 with left index finger swelling and erythema. Pt with hearing difficulties and needs sign language interpreter.  OR 8/22- s/p L index finger amputation, possible plan for further debridement; Plan for TEE Monday 8/26; PMH: hypertension, hyperlipidemia, type 2 diabetes with peripheral neuropathy, Charcot-Marie-Tooth disease, CAD, PAD, chronic HFpEF, CKD stage IIIa, history of transmetatarsal amputation of left foot, gastric ulcers, iron deficiency anemia, GERD.    PT Comments  Pt received in supine, agreeable to therapy session and with good participation and tolerance for transfer and gait training with L PFRW. Paged ortho MD on call to clarify post-op DME recommendations and MD recommend L PFRW, supervising PT notified. Pt needing up to modA for sit<>stand after initial demo for improved technique and minA for bed mobility and gait with L PFRW. Pt interested in short term post-acute rehab and needing increased assist from baseline given performance today, pt needing consistent modA to safely stand even from elevated surfaces to simulate home set-up, per pt he will not have consistent physical assist upon DC at home. Discussed updated DME and disposition with supervising PT Logan B. Pt continues to benefit from PT services to progress toward functional mobility goals.    If plan is discharge home, recommend the following: A little help with bathing/dressing/bathroom;Assistance with cooking/housework;Assist for transportation;Help with stairs or ramp for entrance;Direct supervision/assist for medications management;Assistance with feeding;A lot of help with walking and/or transfers   Can travel by private vehicle     Yes  Equipment Recommendations  Other (comment) (L platform RW)    Recommendations for  Other Services       Precautions / Restrictions Precautions Precautions: Fall Precaution Comments: Contact precs; reports 4-5 falls a month Required Braces or Orthoses: Other Brace Other Brace: orthotic shoes in room (hx L TMA) Restrictions Weight Bearing Restrictions: Yes LUE Weight Bearing: Weight bear through elbow only Other Position/Activity Restrictions: Saturday on-call MD at [facility] states WB through elbow L on PFRW preferred post-op     Mobility  Bed Mobility Overal bed mobility: Needs Assistance Bed Mobility: Supine to Sit     Supine to sit: Min assist, HOB elevated, Used rails     General bed mobility comments: cues for safe UE placement and to avoid WB through L wrist. He states he has hospital bed at home so [facility] remained elevated    Transfers Overall transfer level: Needs assistance Equipment used: Left platform walker Transfers: Sit to/from Stand, Bed to chair/wheelchair/BSC Sit to Stand: Mod assist, From elevated surface           General transfer comment: Cues for hand placement, heavy assist from elevated surface (EOB) to get into standing. Pt states he typically uses elevated hospital bed or lift chair at home and is not used to standing from lower surface heights. Pt unable to stand from slightly elevated bed height with minA, so bed elevated more and pt used momentum and modA to achieve upright and stabilize at PFRW.    Ambulation/Gait Ambulation/Gait assistance: Min assist Gait Distance (Feet): 100 Feet Assistive device: Left platform walker Gait Pattern/deviations: Decreased stride length, Step-through pattern, Trunk flexed       General Gait Details: Cues for step sequencing and safety with L PFRW which was an unfamiliar device for him. Pt tends to keep neck very flexed, mod cues for  upright posture, proximity to PFRW and neutral cervical posture so lungs stay more open; SpO2 92-93% on RA with exertion.      Balance Overall  balance assessment: Needs assistance Sitting-balance support: No upper extremity supported Sitting balance-[name] Scale: Fair     Standing balance support: Bilateral upper extremity supported, During functional activity Standing balance-[name] Scale: Poor Standing balance comment: poor unsupported, fair with L PFRW                            Cognition Arousal: Alert Behavior During Therapy: WFL for tasks assessed/performed Overall Cognitive Status: Within Functional Limits for tasks assessed                                 General Comments: pt needs repetition of some cues throughout for safety/sequencing. live ASL interpreter present.        Exercises      General Comments General comments (skin integrity, edema, etc.): SpO2 desat to 89% in supine prior to OOB, however once standing/during gait trial, SpO2 92-93% on RA throughout and sitting in chair. HR WFL      Pertinent Vitals/Pain Pain Assessment Pain Assessment: Faces Faces Pain Scale: Hurts little more Pain Location: LUE and neck with neutral posture (pt tends to hold neck in flexion while standing) Pain Descriptors / Indicators: Grimacing, Discomfort Pain Intervention(s): Monitored during session, Repositioned     PT Goals (current goals can now be found in the care plan section) Acute Rehab PT Goals Patient Stated Goal: to be able to move around by myself Progress towards PT goals: Progressing toward goals    Frequency    Min 1X/week      PT Plan         AM-PAC PT "6 Clicks" Mobility   Outcome Measure  Help needed turning from your back to your side while in a flat bed without using bedrails?: A Little Help needed moving from lying on your back to sitting on the side of a flat bed without using bedrails?: A Lot Help needed moving to and from a bed to a chair (including a wheelchair)?: A Lot Help needed standing up from a chair using your arms (e.g., wheelchair or bedside  chair)?: A Lot Help needed to walk in hospital room?: A Little Help needed climbing 3-5 steps with a railing? : A Lot 6 Click Score: 14    End of Session Equipment Utilized During Treatment: Gait belt;Oxygen (O2 initially; RA for gait and WFL) Activity Tolerance: Patient tolerated treatment well Patient left: in chair;with call bell/phone within reach;Other (comment) (ASL interpreter in room) Nurse Communication: Mobility status;Other (comment) (pt asking for a breathing treatment after session) PT Visit Diagnosis: Other abnormalities of gait and mobility (R26.89)     Time: 1123-1203 PT Time Calculation (min) (ACUTE ONLY): 40 min  Charges:    $Gait Training: 8-22 mins $Therapeutic Activity: 23-37 mins PT General Charges $$ ACUTE PT VISIT: 1 Visit                     Yvette Loveless P., PTA Acute Rehabilitation Services Secure Chat Preferred 9a-5:30pm Office: [phone]    Dorathy Kinsman [facility] 05/05/2023, 3:12 PM

## 2023-05-05 NOTE — Progress Notes (Signed)
PROGRESS NOTE    Christian [name]  UEA:[id number] DOB: 02-20-1941 DOA: 05/01/2023 PCP: Montez Hageman, DO    Brief Narrative:  Christian [name] is a 82 y.o. male with hearing difficulties and needs sign language interpreter.  He has medical history significant of hypertension, hyperlipidemia, type 2 diabetes with peripheral neuropathy, Charcot-Marie-Tooth disease, CAD, PAD, chronic HFpEF, CKD stage IIIa, history of transmetatarsal amputation of left foot, gastric ulcers, iron deficiency anemia, GERD presenting with left index finger swelling and erythema.  Found to have staph bacteremia.   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   -Blood cultures positive-- repeat per ID.   -OR 8/22- s/p amputation-- may need further debridement-- Per hand surgery: Dressings can be discontinued when there is no longer any active drainage  -TTE with thickening-- will get TEE Monday  CKD stage IIIa Mild metabolic acidosis - baseline 1.3-1.5.     Mild hyponatremia normalized   Fall at home Patient denies head injury or loss of consciousness.  No other injuries reported.  PT/OT eval- home health, fall precautions.   Type 2 diabetes -SSI   Chronic HFpEF Echo done in September 2023 showing EF 60 to 65%, grade 2 diastolic dysfunction.  No signs of volume overload at this time.  -resume lasix   Chronic normocytic anemia Hemoglobin 11.8, baseline 11-13.  No signs of bleeding.  Continue to monitor labs.  No indication for blood transfusion at this time.  Informed by the patient's daughter that patient is okay with receiving blood transfusions during this hospitalization if needed.   Hypertension: Blood pressure stable. Hyperlipidemia CAD: Patient is not endorsing any anginal symptoms. PVD GERD/PUD   DVT prophylaxis: SCDs Start: 05/01/23 2229    Code Status: Full Code   Disposition Plan:  Level of care: Telemetry  Medical Status is: Inpatient Remains inpatient appropriate    Consultants:  ID Hand surgery Cards (TEE)   Subjective: + cough/congestion  Objective: Vitals:   05/04/23 1327 05/04/23 2030 05/05/23 0456 05/05/23 0830  BP: 122/63 130/76 135/68 133/68  Pulse: 86 91 81 75  Resp: 18 19 18 17   Temp: 98.1 F (36.7 C) 97.8 F (36.6 C) 97.6 F (36.4 C) 97.9 F (36.6 C)  TempSrc: Oral Oral Oral Oral  SpO2: 97% 92% 95% 93%  Weight:      Height:        Intake/Output Summary (Last 24 hours) at 05/05/2023 1234 Last data filed at 05/05/2023 0800 Gross per 24 hour  Intake 240 ml  Output 1300 ml  Net -1060 ml   Filed Weights   05/01/23 2300  Weight: 82.1 kg    Examination:   General: Appearance:     Overweight male in no acute distress     Lungs:     respirations unlabored, on [place], diminished, wet sounding upper airway-- clears with cough  Heart:    Normal heart rate.        Neurologic:   Awake, alert       Data Reviewed: I have personally reviewed following labs and imaging studies  CBC: Recent Labs  Lab 05/01/23 1429 05/02/23 0329 05/03/23 0955 05/04/23 0439 05/05/23 0400  WBC 13.2* 11.8* 11.6* 9.2 9.2  NEUTROABS 10.4*  --   --   --   --   HGB 11.8* 14.1 12.2* 12.2* 11.0*  HCT 35.9* 41.9 36.6* 37.5* 34.2*  MCV 88.0 87.5 89.7 89.7 89.1  PLT 214 208 223 258 249  Basic Metabolic Panel: Recent Labs  Lab 05/01/23 1429 05/02/23 0329 05/03/23 0955 05/04/23 0812 05/05/23 0400  NA 130* 132* 135 136 135  K 4.4 3.9 3.8 4.3 4.1  CL 94* 99 104 103 102  CO2 20* 19* 21* 21* 20*  GLUCOSE 209* 215* 147* 160* 146*  BUN 33* 32* 29* 33* 33*  CREATININE 1.70* 1.65* 1.70* 1.73* 1.77*  CALCIUM 8.3* 8.6* 8.1* 8.2* 8.0*  MG  --   --   --  1.8  --    GFR: Estimated Creatinine Clearance: 35.4 mL/min (A) (by C-G formula based on SCr of 1.77 mg/dL (H)). Liver Function Tests: Recent Labs  Lab 05/01/23 1429  AST 27  ALT 21  ALKPHOS 154*  BILITOT 0.9  PROT 6.6   ALBUMIN 2.9*   No results for input(s): "LIPASE", "AMYLASE" in the last 168 hours. No results for input(s): "AMMONIA" in the last 168 hours. Coagulation Profile: No results for input(s): "INR", "PROTIME" in the last 168 hours. Cardiac Enzymes: Recent Labs  Lab 05/05/23 0400  CKTOTAL 154   BNP (last 3 results) No results for input(s): "PROBNP" in the last 8760 hours. HbA1C: No results for input(s): "HGBA1C" in the last 72 hours.  CBG: Recent Labs  Lab 05/04/23 1120 05/04/23 1607 05/04/23 2153 05/05/23 0831 05/05/23 1139  GLUCAP 190* 241* 228* 141* 214*   Lipid Profile: No results for input(s): "CHOL", "HDL", "LDLCALC", "TRIG", "CHOLHDL", "LDLDIRECT" in the last 72 hours. Thyroid Function Tests: No results for input(s): "TSH", "T4TOTAL", "FREET4", "T3FREE", "THYROIDAB" in the last 72 hours. Anemia Panel: No results for input(s): "VITAMINB12", "FOLATE", "FERRITIN", "TIBC", "IRON", "RETICCTPCT" in the last 72 hours. Sepsis Labs: Recent Labs  Lab 05/01/23 1440  LATICACIDVEN 1.1    Recent Results (from the past 240 hour(s))  Blood culture (routine x 2)     Status: None (Preliminary result)   Collection Time: 05/01/23  8:23 PM   Specimen: BLOOD RIGHT ARM  Result Value Ref Range Status   Specimen Description BLOOD RIGHT ARM  Final   Special Requests   Final    BOTTLES DRAWN AEROBIC AND ANAEROBIC Blood Culture results may not be optimal due to an excessive volume of blood received in culture bottles   Culture   Final    NO GROWTH 4 DAYS Performed at [facility], [address], [place], [place] [address]    Report Status PENDING  Incomplete  Blood culture (routine x 2)     Status: Abnormal (Preliminary result)   Collection Time: 05/01/23  8:28 PM   Specimen: BLOOD LEFT ARM  Result Value Ref Range Status   Specimen Description BLOOD LEFT ARM  Final   Special Requests   Final    BOTTLES DRAWN AEROBIC AND ANAEROBIC Blood Culture results may not be optimal due  to an excessive volume of blood received in culture bottles   Culture  Setup Time   Final    GRAM POSITIVE COCCI IN CLUSTERS IN BOTH AEROBIC AND ANAEROBIC BOTTLES CRITICAL RESULT CALLED TO, READ BACK BY AND VERIFIED WITH: Neta Mends [id number] @ 2129 FH    Culture (A)  Final    METHICILLIN RESISTANT STAPHYLOCOCCUS AUREUS Sent to [facility] for further susceptibility testing. Performed at [facility] Lab, [address], [place], [place] [address]    Report Status PENDING  Incomplete   Organism ID, Bacteria METHICILLIN RESISTANT STAPHYLOCOCCUS AUREUS  Final      Susceptibility   Methicillin resistant staphylococcus aureus - MIC*  CIPROFLOXACIN >=8 RESISTANT Resistant     ERYTHROMYCIN >=8 RESISTANT Resistant     GENTAMICIN <=0.5 SENSITIVE Sensitive     OXACILLIN >=4 RESISTANT Resistant     TETRACYCLINE <=1 SENSITIVE Sensitive     VANCOMYCIN <=0.5 SENSITIVE Sensitive     TRIMETH/SULFA >=320 RESISTANT Resistant     CLINDAMYCIN <=0.25 SENSITIVE Sensitive     RIFAMPIN <=0.5 SENSITIVE Sensitive     Inducible Clindamycin NEGATIVE Sensitive     LINEZOLID 2 SENSITIVE Sensitive     * METHICILLIN RESISTANT STAPHYLOCOCCUS AUREUS  Blood Culture ID Panel (Reflexed)     Status: Abnormal   Collection Time: 05/01/23  8:28 PM  Result Value Ref Range Status   Enterococcus faecalis NOT DETECTED NOT DETECTED Final   Enterococcus Faecium NOT DETECTED NOT DETECTED Final   Listeria monocytogenes NOT DETECTED NOT DETECTED Final   Staphylococcus species DETECTED (A) NOT DETECTED Final    Comment: CRITICAL RESULT CALLED TO, READ BACK BY AND VERIFIED WITH: PHARMD Talmadge Chad [id number] @ 2129 FH    Staphylococcus aureus (BCID) DETECTED (A) NOT DETECTED Final    Comment: Methicillin (oxacillin)-resistant Staphylococcus aureus (MRSA). MRSA is predictably resistant to beta-lactam antibiotics (except ceftaroline). Preferred therapy is vancomycin unless clinically contraindicated. Patient requires contact  precautions if  hospitalized. CRITICAL RESULT CALLED TO, READ BACK BY AND VERIFIED WITH: Neta Mends [id number] @ 2129 FH    Staphylococcus epidermidis NOT DETECTED NOT DETECTED Final   Staphylococcus lugdunensis NOT DETECTED NOT DETECTED Final   Streptococcus species NOT DETECTED NOT DETECTED Final   Streptococcus agalactiae NOT DETECTED NOT DETECTED Final   Streptococcus pneumoniae NOT DETECTED NOT DETECTED Final   Streptococcus pyogenes NOT DETECTED NOT DETECTED Final   A.calcoaceticus-baumannii NOT DETECTED NOT DETECTED Final   Bacteroides fragilis NOT DETECTED NOT DETECTED Final   Enterobacterales NOT DETECTED NOT DETECTED Final   Enterobacter cloacae complex NOT DETECTED NOT DETECTED Final   Escherichia coli NOT DETECTED NOT DETECTED Final   Klebsiella aerogenes NOT DETECTED NOT DETECTED Final   Klebsiella oxytoca NOT DETECTED NOT DETECTED Final   Klebsiella pneumoniae NOT DETECTED NOT DETECTED Final   Proteus species NOT DETECTED NOT DETECTED Final   Salmonella species NOT DETECTED NOT DETECTED Final   Serratia marcescens NOT DETECTED NOT DETECTED Final   Haemophilus influenzae NOT DETECTED NOT DETECTED Final   Neisseria meningitidis NOT DETECTED NOT DETECTED Final   Pseudomonas aeruginosa NOT DETECTED NOT DETECTED Final   Stenotrophomonas maltophilia NOT DETECTED NOT DETECTED Final   Candida albicans NOT DETECTED NOT DETECTED Final   Candida auris NOT DETECTED NOT DETECTED Final   Candida glabrata NOT DETECTED NOT DETECTED Final   Candida krusei NOT DETECTED NOT DETECTED Final   Candida parapsilosis NOT DETECTED NOT DETECTED Final   Candida tropicalis NOT DETECTED NOT DETECTED Final   Cryptococcus neoformans/gattii NOT DETECTED NOT DETECTED Final   Meth resistant mecA/C and MREJ DETECTED (A) NOT DETECTED Final    Comment: CRITICAL RESULT CALLED TO, READ BACK BY AND VERIFIED WITH: Neta Mends [id number] @ 2129 FH Performed at [facility], [address] [place], [place] [address]   Culture, blood (Routine X 2) w Reflex to ID Panel     Status: None (Preliminary result)   Collection Time: 05/02/23 11:09 PM   Specimen: BLOOD  Result Value Ref Range Status   Specimen Description BLOOD BLOOD RIGHT HAND  Final   Special Requests   Final    BOTTLES DRAWN AEROBIC AND ANAEROBIC Blood Culture  results may not be optimal due to an inadequate volume of blood received in culture bottles   Culture   Final    NO GROWTH 3 DAYS Performed at [facility], [address], [place], [place] [address]    Report Status PENDING  Incomplete  Culture, blood (Routine X 2) w Reflex to ID Panel     Status: None (Preliminary result)   Collection Time: 05/02/23 11:09 PM   Specimen: BLOOD  Result Value Ref Range Status   Specimen Description BLOOD BLOOD RIGHT HAND  Final   Special Requests   Final    BOTTLES DRAWN AEROBIC AND ANAEROBIC Blood Culture adequate volume   Culture   Final    NO GROWTH 3 DAYS Performed at [facility], [address] [place], [place] [address]    Report Status PENDING  Incomplete  Aerobic/Anaerobic Culture w Gram Stain (surgical/deep wound)     Status: None (Preliminary result)   Collection Time: 05/03/23  4:10 PM   Specimen: PATH Cytology Misc. fluid; Body Fluid  Result Value Ref Range Status   Specimen Description WOUND  Final   Special Requests LEFT INDEX FINGER  Final   Gram Stain NO WBC SEEN NO ORGANISMS SEEN   Final   Culture   Final    RARE STAPHYLOCOCCUS AUREUS SUSCEPTIBILITIES TO FOLLOW Performed at [facility] Lab, [address] [place], [place] [address]    Report Status PENDING  Incomplete         Radiology Studies: ECHOCARDIOGRAM COMPLETE  Result Date: 05/03/2023    ECHOCARDIOGRAM REPORT   Patient Name:   Christian [name] Date of Exam: 05/03/2023 Medical Rec #:  [id number]        Height:       68.0 in Accession #:    [id number]       Weight:       181.0 lb Date of Birth:  11/01/1940       BSA:           1.959 m Patient Age:    5 years         BP:           136/69 mmHg Patient Gender: M                HR:           84 bpm. Exam Location:  Inpatient Procedure: 2D Echo, Cardiac Doppler and Color Doppler Indications:    Bacteremia R78.81  History:        Patient has prior history of Echocardiogram examinations, most                 recent 05/30/2022. CHF, Angina; Risk Factors:Hypertension and                 Diabetes. CKD, stage 3.  Sonographer:    Lucendia Herrlich Referring Phys: [id number] CORNELIUS N VAN DAM IMPRESSIONS  1. Left ventricular ejection fraction, by estimation, is 60 to 65%. The left ventricle has normal function. The left ventricle has no regional wall motion abnormalities. Left ventricular diastolic parameters were normal.  2. Right ventricular systolic function is normal. The right ventricular size is normal. There is normal pulmonary artery systolic pressure.  3. The mitral valve is degenerative. Trivial mitral valve regurgitation. No evidence of mitral stenosis.  4. The aortic valve was not well visualized. There is mild calcification of the aortic valve. There is mild thickening of the aortic valve.  Aortic valve regurgitation is not visualized. Aortic valve sclerosis is present, with no evidence of aortic valve  stenosis.  5. The inferior vena cava is dilated in size with >50% respiratory variability, suggesting right atrial pressure of 8 mmHg. Comparison(s): No significant change from prior study. Prior images reviewed side by side. Conclusion(s)/Recommendation(s): No evidence of valvular vegetations on this transthoracic echocardiogram. Consider a transesophageal echocardiogram to exclude infective endocarditis if clinically indicated. FINDINGS  Left Ventricle: Left ventricular ejection fraction, by estimation, is 60 to 65%. The left ventricle has normal function. The left ventricle has no regional wall motion abnormalities. The left ventricular internal cavity size was normal in size. There  is  no left ventricular hypertrophy. Left ventricular diastolic parameters were normal. Right Ventricle: The right ventricular size is normal. Right vetricular wall thickness was not well visualized. Right ventricular systolic function is normal. There is normal pulmonary artery systolic pressure. The tricuspid regurgitant velocity is 1.04 m/s, and with an assumed right atrial pressure of 8 mmHg, the estimated right ventricular systolic pressure is 12.3 mmHg. Left Atrium: Left atrial size was normal in size. Right Atrium: Right atrial size was normal in size. Pericardium: There is no evidence of pericardial effusion. Mitral Valve: The mitral valve is degenerative in appearance. There is mild thickening of the mitral valve leaflet(s). There is mild calcification of the mitral valve leaflet(s). Trivial mitral valve regurgitation. No evidence of mitral valve stenosis. MV peak gradient, 8.2 mmHg. The mean mitral valve gradient is 3.5 mmHg. Tricuspid Valve: The tricuspid valve is not well visualized. Tricuspid valve regurgitation is trivial. No evidence of tricuspid stenosis. Aortic Valve: The aortic valve was not well visualized. There is mild calcification of the aortic valve. There is mild thickening of the aortic valve. Aortic valve regurgitation is not visualized. Aortic valve sclerosis is present, with no evidence of aortic valve stenosis. Aortic valve peak gradient measures 9.2 mmHg. Pulmonic Valve: The pulmonic valve was not well visualized. Pulmonic valve regurgitation is not visualized. Aorta: The aortic root is normal in size and structure. Venous: The inferior vena cava is dilated in size with greater than 50% respiratory variability, suggesting right atrial pressure of 8 mmHg. IAS/Shunts: The atrial septum is grossly normal.  LEFT VENTRICLE PLAX 2D LVIDd:         4.50 cm   Diastology LVIDs:         2.90 cm   LV e' medial:    6.84 cm/s LV PW:         1.00 cm   LV E/e' medial:  13.9 LV IVS:        0.80 cm   LV  e' lateral:   11.70 cm/s LVOT diam:     2.10 cm   LV E/e' lateral: 8.1 LV SV:         67 LV SV Index:   34 LVOT Area:     3.46 cm  RIGHT VENTRICLE             IVC RV S prime:     16.60 cm/s  IVC diam: 2.80 cm TAPSE (M-mode): 2.2 cm LEFT ATRIUM             Index        RIGHT ATRIUM           Index LA diam:        2.30 cm 1.17 cm/m   RA Area:     10.50 cm LA Vol (A2C):   37.7 ml 19.25  ml/m  RA Volume:   17.60 ml  8.98 ml/m LA Vol (A4C):   50.9 ml 25.98 ml/m LA Biplane Vol: 44.5 ml 22.72 ml/m  AORTIC VALVE AV Area (Vmax): 2.32 cm AV Vmax:        152.00 cm/s AV Peak Grad:   9.2 mmHg LVOT Vmax:      101.80 cm/s LVOT Vmean:     64.000 cm/s LVOT VTI:       0.194 m  AORTA Ao Root diam: 3.40 cm Ao Asc diam:  2.90 cm MITRAL VALVE                TRICUSPID VALVE MV Area (PHT): 3.21 cm     TR Peak grad:   4.3 mmHg MV Area VTI:   1.55 cm     TR Vmax:        104.00 cm/s MV Peak grad:  8.2 mmHg MV Mean grad:  3.5 mmHg     SHUNTS MV Vmax:       1.44 m/s     Systemic VTI:  0.19 m MV Vmean:      81.9 cm/s    Systemic Diam: 2.10 cm MV Decel Time: 236 msec MV E velocity: 94.90 cm/s MV A velocity: 138.00 cm/s MV E/A ratio:  0.69 Jodelle Red MD Electronically signed by Jodelle Red MD Signature Date/Time: 05/03/2023/2:11:43 PM    Final         Scheduled Meds:  guaiFENesin  600 mg Oral BID   insulin aspart  0-15 Units Subcutaneous TID WC   insulin aspart  0-5 Units Subcutaneous QHS   isosorbide mononitrate  60 mg Oral Daily   tamsulosin  0.4 mg Oral Daily   Continuous Infusions:  DAPTOmycin (CUBICIN) 650 mg in sodium chloride 0.9 % IVPB 650 mg (05/05/23 0743)     LOS: 4 days    Time spent: 45 minutes spent on chart review, discussion with nursing staff, consultants, updating family and interview/physical exam; more than 50% of that time was spent in counseling and/or coordination of care.    Joseph Art, DO [facility] Available via Epic secure chat 7am-7pm After these hours,  please refer to coverage provider listed on [email or web address] 05/05/2023, 12:34 PM

## 2023-05-05 NOTE — TOC Initial Note (Addendum)
Transition of Care [facility]) - Initial/Assessment Note    Patient Details  Name: Christian [name] MRN: [id number] Date of Birth: [birth year]  Transition of Care [facility]) CM/SW Contact:    Carmina Miller, LCSWA Phone Number: 05/05/2023, 9:38 AM  Clinical Narrative:                 Update-CSW received return call from Crystal, she states she would like pt to go to SNF if he is going to dc on IV antibiotics as he lives alone and would not be able to give himself injections.  CSW spoke with MD, she is waiting on ID recs, advised wishes of pt's daughter. TOC will continue to follow.  CSW received consult, attempted to call pt's daughter Aggie Cosier (states she is POA) to discuss dc plans, no answer, vm left.        Patient Goals and CMS Choice            Expected Discharge Plan and Services                                              Prior Living Arrangements/Services                       Activities of Daily Living      Permission Sought/Granted                  Emotional Assessment              Admission diagnosis:  Osteomyelitis (HCC) [M86.9] Patient Active Problem List   Diagnosis Date Noted   Osteomyelitis (HCC) 05/01/2023   Metabolic acidosis 05/01/2023   Hyponatremia 05/01/2023   Chronic heart failure with preserved ejection fraction (HFpEF) (HCC) 05/01/2023   Decreased functional mobility and endurance 06/15/2021   Pressure injury of skin 03/15/2021   Wound cellulitis 03/12/2021   Acute-on-chronic kidney injury (HCC) 03/12/2021   Fall at home, initial encounter 09/14/2020   Chronic kidney disease, stage III (moderate) (HCC) 09/11/2020   Gastro-esophageal reflux disease with esophagitis 03/16/2020   Intractable vomiting with nausea 03/13/2020   Decreased activities of daily living (ADL) 02/26/2020   Dysphagia 02/26/2020   Impaired functional mobility, balance, gait, and endurance 02/26/2020   Acute respiratory failure with hypoxia  (HCC) 10/27/2019   Sepsis (HCC) 10/21/2019   Generalized anxiety disorder 09/01/2019   Primary insomnia 09/01/2019   GERD without esophagitis 07/23/2019   Meningioma (HCC) 07/23/2019   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   Ulcer of left foot, limited to breakdown of skin (HCC) 02/07/2018   Chronic pain of both shoulders 08/09/2017   Pain in right wrist 08/09/2017   Atherosclerosis of renal artery (HCC) 04/24/2017   Type 2 diabetes mellitus (HCC) 04/24/2017   Chronic left-sided low back pain with left-sided sciatica 07/24/2016   Lower extremity edema 01/28/2015   Angina pectoris (HCC) 07/08/2014   Normocytic anemia 05/06/2014   Acute gastric ulcer 08/27/2013   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   IDA (iron deficiency anemia) 08/25/2013   Mixed hyperlipidemia 08/13/2013   Essential hypertension, benign 08/13/2013   PCP:  Montez Hageman, DO Pharmacy:   [facility] [phone] - [place], [place] - [address] [place] [address] Phone: [phone] Fax: [phone]  [facility]) -  [place], [place] - [address] [place] [address] Phone: [phone] Fax: [phone]  [facility] - [place], [place] - [address] [place]  [address] Phone: [phone] Fax: [phone]     Social Determinants of Health (SDOH) Social History: SDOH Screenings   Tobacco Use: Low Risk  (05/03/2023)   SDOH Interventions:     Readmission Risk Interventions     No data to display
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
## 2023-05-05 NOTE — Final Progress Note (Signed)
05/03/23- L IF amputation thru MPJ  Resting comfortably this am.  Postop dressing in place  Radial portion of hand remains swollen, some erythema bordering the incision, scant drainage, nothing really expressible.  Light gauze dressing replaced    Dressings can be discontinued when there is no longer any active drainage.  Please contact me if questions arise or additional surgical services are requested.  Neil Crouch, MD Hand Surgery

## 2023-05-05 NOTE — Progress Notes (Signed)
Occupational Therapy Treatment Patient Details Name: Christian [name] MRN: [id number] DOB: 1941/03/15 Today's Date: 05/05/2023   History of present illness Pt is a 82 year old male admitted on 05/01/23 with left index finger swelling and erythema. Pt with hearing difficulties and needs sign language interpreter.  He has medical history significant of hypertension, hyperlipidemia, type 2 diabetes with peripheral neuropathy, Charcot-Marie-Tooth disease, CAD, PAD, chronic HFpEF, CKD stage IIIa, history of transmetatarsal amputation of left foot, gastric ulcers, iron deficiency anemia, GERD.   OT comments  Pt. Seen for skilled OT treatment session.  Sign language interpreter present and active/helpful during session.  Pt. Able to complete bed mobility mod a from requested elevated surface. Max a for lb dressing.  Max a for pericare following bm in b.room. cga during ambulation but heavier assist for sit/stands.  Cont. To progress adls next session.         If plan is discharge home, recommend the following:  A little help with walking and/or transfers;Assistance with cooking/housework;A little help with bathing/dressing/bathroom;Help with stairs or ramp for entrance   Equipment Recommendations  None recommended by OT    Recommendations for Other Services      Precautions / Restrictions Precautions Precautions: Fall Precaution Comments: reports 4-5 falls a month Required Braces or Orthoses: Other Brace Other Brace: has an orthotic shoe for the L foot, both shoes present in room  Restrictions Other Position/Activity Restrictions: no WB restrictions on post surgical notes for LUE.  After session complete PTA updated that after speaking with someone at [facility], WB through elbow on PLRW is preferred       Mobility Bed Mobility Overal bed mobility: Needs Assistance Bed Mobility: Supine to Sit     Supine to sit: Min assist, Mod assist, HOB elevated     General bed mobility  comments: reaching rue forward "i need help" reviewed need for him to attempt first he was adamant that this is how he does it at home with grandson assisting. states he has hospital bed at home so hob remained elevated    Transfers Overall transfer level: Needs assistance Equipment used: Rolling walker (2 wheels) Transfers: Sit to/from Stand, Bed to chair/wheelchair/BSC Sit to Stand: Mod assist, From elevated surface     Step pivot transfers: Contact guard assist     General transfer comment: Cues for hand placement, heavy assist from elevated surface (eob) to get into standing, heavy use of grab bar in b.room also to get into standing after using the b.room     Balance                                           ADL either performed or assessed with clinical judgement   ADL Overall ADL's : Needs assistance/impaired     Grooming: Sitting;Maximal assistance Grooming Details (indicate cue type and reason): requesting asssitance with scratching back, and applying lotion due to itching and reports limited rom in [place] due to reported arthritis         Upper Body Dressing : Minimal assistance;Sitting Upper Body Dressing Details (indicate cue type and reason): don/doff gown and managment of lines Lower Body Dressing: Maximal assistance;Sit to/from stand;Sitting/lateral leans Lower Body Dressing Details (indicate cue type and reason): states he can not don shoes today secondary to back pain Toilet Transfer: Contact guard assist;Ambulation;Rolling walker (2 wheels)   Toileting- Clothing Manipulation and  Hygiene: Maximal assistance;Sit to/from stand Toileting - Clothing Manipulation Details (indicate cue type and reason): reports usually uses L hand for cleaning buttocks and would not attempt with rue. requests use of warm wet washcloths states he can not use toilet paper for wiping     Functional mobility during ADLs: Contact guard assist;Rolling walker (2  wheels) General ADL Comments: pt.reporting arthiris in [place] along with back pain limiting his abilities to attempt and complete adls.  interpreter who was present assisted with explaining role of therapy and that he needed to always try first then request assistance her nodded and signaled understanding but still cont. to request things be completed for him without attempting to try first    Extremity/Trunk Assessment              Vision       Perception     Praxis      Cognition Arousal: Alert Behavior During Therapy: [facility] for tasks assessed/performed Overall Cognitive Status: Within Functional Limits for tasks assessed                                          Exercises      Shoulder Instructions       General Comments      Pertinent Vitals/ Pain       Pain Assessment Pain Assessment: No/denies pain  Home Living                                          Prior Functioning/Environment              Frequency  Min 1X/week        Progress Toward Goals  OT Goals(current goals can now be found in the care plan section)  Progress towards OT goals: Progressing toward goals     Plan      Co-evaluation                 AM-PAC OT "6 Clicks" Daily Activity     Outcome Measure   Help from another person eating meals?: A Little Help from another person taking care of personal grooming?: A Little Help from another person toileting, which includes using toliet, bedpan, or urinal?: A Little Help from another person bathing (including washing, rinsing, drying)?: A Lot Help from another person to put on and taking off regular upper body clothing?: A Little Help from another person to put on and taking off regular lower body clothing?: A Lot 6 Click Score: 16    End of Session Equipment Utilized During Treatment: Rolling walker (2 wheels);Gait belt  OT Visit Diagnosis: Unsteadiness on feet (R26.81);Other abnormalities of  gait and mobility (R26.89);Pain;Muscle weakness (generalized) (M62.81);History of falling (Z91.81)   Activity Tolerance Patient tolerated treatment well   Patient Left Other (comment) (seated eob waiting for rn to come in, interpreter remained present outside of the room)   Nurse Communication Other (comment) (alerted rn bandage on hand had come off during ambulation. she was present at end of session to replace bandage for pt.)        Time: 1016-1055 OT Time Calculation (min): 39 min  Charges: OT General Charges $OT Visit: 1 Visit OT Treatments $Self Care/Home Management : 38-52 mins  Boneta Lucks, COTA/L Acute Rehabilitation [phone]  Alessandra Bevels Lorraine-COTA/L 05/05/2023, 1:22 PM

## 2023-05-06 ENCOUNTER — Inpatient Hospital Stay (HOSPITAL_COMMUNITY): Payer: Medicare Other

## 2023-05-06 DIAGNOSIS — R0600 Dyspnea, unspecified: Secondary | ICD-10-CM

## 2023-05-06 LAB — GLUCOSE, CAPILLARY
Glucose-Capillary: 188 mg/dL — ABNORMAL HIGH (ref 70–99)
Glucose-Capillary: 176 mg/dL — ABNORMAL HIGH (ref 70–99)
Glucose-Capillary: 305 mg/dL — ABNORMAL HIGH (ref 70–99)
Glucose-Capillary: 173 mg/dL — ABNORMAL HIGH (ref 70–99)

## 2023-05-06 LAB — CULTURE, BLOOD (ROUTINE X 2): Culture: NO GROWTH

## 2023-05-06 MED ORDER — FUROSEMIDE 40 MG PO TABS
40.0000 mg | ORAL_TABLET | Freq: Every day | ORAL | Status: DC
  Administered 2023-05-06 – 2023-05-10 (×5): 40 mg via ORAL
  Filled 2023-05-06 (×5): qty 1

## 2023-05-06 MED ORDER — SALINE SPRAY 0.65 % NA SOLN
2.0000 | Freq: Two times a day (BID) | NASAL | Status: DC
  Administered 2023-05-06 – 2023-05-10 (×9): 2 via NASAL
  Filled 2023-05-06: qty 44

## 2023-05-06 NOTE — Plan of Care (Signed)
  Problem: Education: Goal: Ability to describe self-care measures that may prevent or decrease complications (Diabetes Survival Skills Education) will improve Outcome: Progressing   Problem: Coping: Goal: Ability to adjust to condition or change in health will improve Outcome: Progressing   Problem: Fluid Volume: Goal: Ability to maintain a balanced intake and output will improve Outcome: Progressing   Problem: Nutritional: Goal: Maintenance of adequate nutrition will improve Outcome: Progressing   Problem: Skin Integrity: Goal: Risk for impaired skin integrity will decrease Outcome: Progressing   Problem: Tissue Perfusion: Goal: Adequacy of tissue perfusion will improve Outcome: Progressing

## 2023-05-06 NOTE — Progress Notes (Signed)
PROGRESS NOTE    RICCO PILOTO  ZOX:[id number] DOB: [birth year] DOA: 05/01/2023 PCP: Montez Hageman, DO    Brief Narrative:  Christian [name] is a 82 y.o. male with hearing difficulties and needs sign language interpreter.  He has medical history significant of hypertension, hyperlipidemia, type 2 diabetes with peripheral neuropathy, Charcot-Marie-Tooth disease, CAD, PAD, chronic HFpEF, CKD stage IIIa, history of transmetatarsal amputation of left foot, gastric ulcers, iron deficiency anemia, GERD presenting with left index finger swelling and erythema.  Found to have staph bacteremia.   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   -Blood cultures positive-- repeat per ID.   -OR 8/22- s/p amputation-- may need further debridement-- Per hand surgery: Dressings can be discontinued when there is no longer any active drainage  -TTE with thickening--  TEE Monday  CKD stage IIIa Mild metabolic acidosis - baseline 1.3-1.5.     Mild hyponatremia normalized   Fall at home Patient denies head injury or loss of consciousness.  No other injuries reported.  PT/OT eval- home health, fall precautions.   Type 2 diabetes -SSI   Chronic HFpEF Echo done in September 2023 showing EF 60 to 65%, grade 2 diastolic dysfunction.  No signs of volume overload at this time.  -resume lasix -check x ray for dyspnea   Chronic normocytic anemia Hemoglobin 11.8, baseline 11-13.  No signs of bleeding.  Continue to monitor labs.  No indication for blood transfusion at this time.  Informed by the patient's daughter that patient is okay with receiving blood transfusions during this hospitalization if needed.   Hypertension: Blood pressure stable. Hyperlipidemia CAD: Patient is not endorsing any anginal symptoms. PVD GERD/PUD   DVT prophylaxis: SCDs Start: 05/01/23 2229    Code Status: Full Code   Disposition Plan:  Level of  care: Telemetry Medical Status is: Inpatient Remains inpatient appropriate -- if needs long IV abx will need to go to SNF   Consultants:  ID Hand surgery Cards (TEE)   Subjective: C/o dryness in nose  Objective: Vitals:   05/05/23 1524 05/05/23 1931 05/06/23 0456 05/06/23 0839  BP: 124/67 127/61 125/69 129/62  Pulse: 80 91 68 77  Resp: 17 18 15 18   Temp: 97.7 F (36.5 C) 98.3 F (36.8 C)  98.5 F (36.9 C)  TempSrc: Oral Oral    SpO2: 99% 100% 96% 93%  Weight:      Height:        Intake/Output Summary (Last 24 hours) at 05/06/2023 1107 Last data filed at 05/06/2023 0600 Gross per 24 hour  Intake 720 ml  Output 2825 ml  Net -2105 ml   Filed Weights   05/01/23 2300  Weight: 82.1 kg    Examination:   General: Appearance:     Overweight male in no acute distress     Lungs:     Crackles at bases  Heart:    Normal heart rate.        Neurologic:   Awake, alert       Data Reviewed: I have personally reviewed following labs and imaging studies  CBC: Recent Labs  Lab 05/01/23 1429 05/02/23 0329 05/03/23 0955 05/04/23 0439 05/05/23 0400  WBC 13.2* 11.8* 11.6* 9.2 9.2  NEUTROABS 10.4*  --   --   --   --   HGB 11.8* 14.1 12.2* 12.2* 11.0*  HCT 35.9* 41.9 36.6* 37.5* 34.2*  MCV 88.0 87.5 89.7 89.7 89.1  PLT 214 208  223 258 249   Basic Metabolic Panel: Recent Labs  Lab 05/01/23 1429 05/02/23 0329 05/03/23 0955 05/04/23 0812 05/05/23 0400  NA 130* 132* 135 136 135  K 4.4 3.9 3.8 4.3 4.1  CL 94* 99 104 103 102  CO2 20* 19* 21* 21* 20*  GLUCOSE 209* 215* 147* 160* 146*  BUN 33* 32* 29* 33* 33*  CREATININE 1.70* 1.65* 1.70* 1.73* 1.77*  CALCIUM 8.3* 8.6* 8.1* 8.2* 8.0*  MG  --   --   --  1.8  --    GFR: Estimated Creatinine Clearance: 35.4 mL/min (A) (by C-G formula based on SCr of 1.77 mg/dL (H)). Liver Function Tests: Recent Labs  Lab 05/01/23 1429  AST 27  ALT 21  ALKPHOS 154*  BILITOT 0.9  PROT 6.6  ALBUMIN 2.9*   No results for  input(s): "LIPASE", "AMYLASE" in the last 168 hours. No results for input(s): "AMMONIA" in the last 168 hours. Coagulation Profile: No results for input(s): "INR", "PROTIME" in the last 168 hours. Cardiac Enzymes: Recent Labs  Lab 05/05/23 0400  CKTOTAL 154   BNP (last 3 results) No results for input(s): "PROBNP" in the last 8760 hours. HbA1C: No results for input(s): "HGBA1C" in the last 72 hours.  CBG: Recent Labs  Lab 05/05/23 0831 05/05/23 1139 05/05/23 1626 05/05/23 2120 05/06/23 0831  GLUCAP 141* 214* 193* 269* 188*   Lipid Profile: No results for input(s): "CHOL", "HDL", "LDLCALC", "TRIG", "CHOLHDL", "LDLDIRECT" in the last 72 hours. Thyroid Function Tests: No results for input(s): "TSH", "T4TOTAL", "FREET4", "T3FREE", "THYROIDAB" in the last 72 hours. Anemia Panel: No results for input(s): "VITAMINB12", "FOLATE", "FERRITIN", "TIBC", "IRON", "RETICCTPCT" in the last 72 hours. Sepsis Labs: Recent Labs  Lab 05/01/23 1440  LATICACIDVEN 1.1    Recent Results (from the past 240 hour(s))  Blood culture (routine x 2)     Status: None   Collection Time: 05/01/23  8:23 PM   Specimen: BLOOD RIGHT ARM  Result Value Ref Range Status   Specimen Description BLOOD RIGHT ARM  Final   Special Requests   Final    BOTTLES DRAWN AEROBIC AND ANAEROBIC Blood Culture results may not be optimal due to an excessive volume of blood received in culture bottles   Culture   Final    NO GROWTH 5 DAYS Performed at [facility], [address] [place], [place] [address]    Report Status 05/06/2023 FINAL  Final  Blood culture (routine x 2)     Status: Abnormal (Preliminary result)   Collection Time: 05/01/23  8:28 PM   Specimen: BLOOD LEFT ARM  Result Value Ref Range Status   Specimen Description BLOOD LEFT ARM  Final   Special Requests   Final    BOTTLES DRAWN AEROBIC AND ANAEROBIC Blood Culture results may not be optimal due to an excessive volume of blood received in culture  bottles   Culture  Setup Time   Final    GRAM POSITIVE COCCI IN CLUSTERS IN BOTH AEROBIC AND ANAEROBIC BOTTLES CRITICAL RESULT CALLED TO, READ BACK BY AND VERIFIED WITH: Neta Mends [id number] @ 2129 FH    Culture (A)  Final    METHICILLIN RESISTANT STAPHYLOCOCCUS AUREUS Sent to [facility] for further susceptibility testing. Performed at [facility], [address], [place], [place] [address]    Report Status PENDING  Incomplete   Organism ID, Bacteria METHICILLIN RESISTANT STAPHYLOCOCCUS AUREUS  Final      Susceptibility   Methicillin resistant  staphylococcus aureus - MIC*    CIPROFLOXACIN >=8 RESISTANT Resistant     ERYTHROMYCIN >=8 RESISTANT Resistant     GENTAMICIN <=0.5 SENSITIVE Sensitive     OXACILLIN >=4 RESISTANT Resistant     TETRACYCLINE <=1 SENSITIVE Sensitive     VANCOMYCIN <=0.5 SENSITIVE Sensitive     TRIMETH/SULFA >=320 RESISTANT Resistant     CLINDAMYCIN <=0.25 SENSITIVE Sensitive     RIFAMPIN <=0.5 SENSITIVE Sensitive     Inducible Clindamycin NEGATIVE Sensitive     LINEZOLID 2 SENSITIVE Sensitive     * METHICILLIN RESISTANT STAPHYLOCOCCUS AUREUS  Blood Culture ID Panel (Reflexed)     Status: Abnormal   Collection Time: 05/01/23  8:28 PM  Result Value Ref Range Status   Enterococcus faecalis NOT DETECTED NOT DETECTED Final   Enterococcus Faecium NOT DETECTED NOT DETECTED Final   Listeria monocytogenes NOT DETECTED NOT DETECTED Final   Staphylococcus species DETECTED (A) NOT DETECTED Final    Comment: CRITICAL RESULT CALLED TO, READ BACK BY AND VERIFIED WITH: PHARMD Talmadge Chad [id number] @ 2129 FH    Staphylococcus aureus (BCID) DETECTED (A) NOT DETECTED Final    Comment: Methicillin (oxacillin)-resistant Staphylococcus aureus (MRSA). MRSA is predictably resistant to beta-lactam antibiotics (except ceftaroline). Preferred therapy is vancomycin unless clinically contraindicated. Patient requires contact precautions if  hospitalized. CRITICAL RESULT  CALLED TO, READ BACK BY AND VERIFIED WITH: Neta Mends [id number] @ 2129 FH    Staphylococcus epidermidis NOT DETECTED NOT DETECTED Final   Staphylococcus lugdunensis NOT DETECTED NOT DETECTED Final   Streptococcus species NOT DETECTED NOT DETECTED Final   Streptococcus agalactiae NOT DETECTED NOT DETECTED Final   Streptococcus pneumoniae NOT DETECTED NOT DETECTED Final   Streptococcus pyogenes NOT DETECTED NOT DETECTED Final   A.calcoaceticus-baumannii NOT DETECTED NOT DETECTED Final   Bacteroides fragilis NOT DETECTED NOT DETECTED Final   Enterobacterales NOT DETECTED NOT DETECTED Final   Enterobacter cloacae complex NOT DETECTED NOT DETECTED Final   Escherichia coli NOT DETECTED NOT DETECTED Final   Klebsiella aerogenes NOT DETECTED NOT DETECTED Final   Klebsiella oxytoca NOT DETECTED NOT DETECTED Final   Klebsiella pneumoniae NOT DETECTED NOT DETECTED Final   Proteus species NOT DETECTED NOT DETECTED Final   Salmonella species NOT DETECTED NOT DETECTED Final   Serratia marcescens NOT DETECTED NOT DETECTED Final   Haemophilus influenzae NOT DETECTED NOT DETECTED Final   Neisseria meningitidis NOT DETECTED NOT DETECTED Final   Pseudomonas aeruginosa NOT DETECTED NOT DETECTED Final   Stenotrophomonas maltophilia NOT DETECTED NOT DETECTED Final   Candida albicans NOT DETECTED NOT DETECTED Final   Candida auris NOT DETECTED NOT DETECTED Final   Candida glabrata NOT DETECTED NOT DETECTED Final   Candida krusei NOT DETECTED NOT DETECTED Final   Candida parapsilosis NOT DETECTED NOT DETECTED Final   Candida tropicalis NOT DETECTED NOT DETECTED Final   Cryptococcus neoformans/gattii NOT DETECTED NOT DETECTED Final   Meth resistant mecA/C and MREJ DETECTED (A) NOT DETECTED Final    Comment: CRITICAL RESULT CALLED TO, READ BACK BY AND VERIFIED WITH: Neta Mends [id number] @ 2129 FH Performed at [facility], [address], [place], [place] [address]   Culture, blood  (Routine X 2) w Reflex to ID Panel     Status: None (Preliminary result)   Collection Time: 05/02/23 11:09 PM   Specimen: BLOOD  Result Value Ref Range Status   Specimen Description BLOOD BLOOD RIGHT HAND  Final   Special Requests   Final  BOTTLES DRAWN AEROBIC AND ANAEROBIC Blood Culture results may not be optimal due to an inadequate volume of blood received in culture bottles   Culture   Final    NO GROWTH 4 DAYS Performed at [facility], [address] [place], [place] [address]    Report Status PENDING  Incomplete  Culture, blood (Routine X 2) w Reflex to ID Panel     Status: None (Preliminary result)   Collection Time: 05/02/23 11:09 PM   Specimen: BLOOD  Result Value Ref Range Status   Specimen Description BLOOD BLOOD RIGHT HAND  Final   Special Requests   Final    BOTTLES DRAWN AEROBIC AND ANAEROBIC Blood Culture adequate volume   Culture   Final    NO GROWTH 4 DAYS Performed at [facility], [address] [place], [place] [address]    Report Status PENDING  Incomplete  Aerobic/Anaerobic Culture w Gram Stain (surgical/deep wound)     Status: None (Preliminary result)   Collection Time: 05/03/23  4:10 PM   Specimen: PATH Cytology Misc. fluid; Body Fluid  Result Value Ref Range Status   Specimen Description WOUND  Final   Special Requests LEFT INDEX FINGER  Final   Gram Stain   Final    NO WBC SEEN NO ORGANISMS SEEN Performed at [facility], [address] [place], [place] [address]    Culture   Final    RARE STAPHYLOCOCCUS AUREUS SUSCEPTIBILITIES TO FOLLOW NO ANAEROBES ISOLATED; CULTURE IN PROGRESS FOR 5 DAYS    Report Status PENDING  Incomplete         Radiology Studies: No results found.      Scheduled Meds:  guaiFENesin  600 mg Oral BID   insulin aspart  0-15 Units Subcutaneous TID WC   insulin aspart  0-5 Units Subcutaneous QHS   isosorbide mononitrate  60 mg Oral Daily   sodium chloride  2 spray Each Nare BID    tamsulosin  0.4 mg Oral Daily   Continuous Infusions:  DAPTOmycin (CUBICIN) 650 mg in sodium chloride 0.9 % IVPB 650 mg (05/05/23 0743)     LOS: 5 days    Time spent: 45 minutes spent on chart review, discussion with nursing staff, consultants, updating family and interview/physical exam; more than 50% of that time was spent in counseling and/or coordination of care.    Joseph Art, DO [facility] Available via Epic secure chat 7am-7pm After these hours, please refer to coverage provider listed on [email or web address] 05/06/2023, 11:07 AM

## 2023-05-07 ENCOUNTER — Inpatient Hospital Stay (HOSPITAL_COMMUNITY): Payer: Medicare Other | Admitting: Anesthesiology

## 2023-05-07 ENCOUNTER — Other Ambulatory Visit: Payer: Self-pay

## 2023-05-07 ENCOUNTER — Encounter (HOSPITAL_COMMUNITY)
Admission: EM | Disposition: A | Discharge status: Skilled Nursing Facility | Payer: Self-pay | Source: Home / Self Care | Attending: Internal Medicine

## 2023-05-07 ENCOUNTER — Inpatient Hospital Stay (HOSPITAL_COMMUNITY): Payer: Medicare Other

## 2023-05-07 ENCOUNTER — Encounter (HOSPITAL_COMMUNITY): Payer: Self-pay | Admitting: Internal Medicine

## 2023-05-07 DIAGNOSIS — I5032 Chronic diastolic (congestive) heart failure: Secondary | ICD-10-CM

## 2023-05-07 DIAGNOSIS — N183 Chronic kidney disease, stage 3 unspecified: Secondary | ICD-10-CM

## 2023-05-07 DIAGNOSIS — I340 Nonrheumatic mitral (valve) insufficiency: Secondary | ICD-10-CM | POA: Diagnosis not present

## 2023-05-07 DIAGNOSIS — Z794 Long term (current) use of insulin: Secondary | ICD-10-CM | POA: Diagnosis not present

## 2023-05-07 DIAGNOSIS — M86142 Other acute osteomyelitis, left hand: Secondary | ICD-10-CM | POA: Diagnosis not present

## 2023-05-07 DIAGNOSIS — I130 Hypertensive heart and chronic kidney disease with heart failure and stage 1 through stage 4 chronic kidney disease, or unspecified chronic kidney disease: Secondary | ICD-10-CM

## 2023-05-07 DIAGNOSIS — R7881 Bacteremia: Secondary | ICD-10-CM

## 2023-05-07 DIAGNOSIS — M869 Osteomyelitis, unspecified: Secondary | ICD-10-CM | POA: Diagnosis not present

## 2023-05-07 DIAGNOSIS — E1169 Type 2 diabetes mellitus with other specified complication: Secondary | ICD-10-CM | POA: Diagnosis not present

## 2023-05-07 HISTORY — PX: TEE WITHOUT CARDIOVERSION: SHX5443

## 2023-05-07 LAB — CULTURE, BLOOD (ROUTINE X 2)
Culture: NO GROWTH
Culture: NO GROWTH
Special Requests: ADEQUATE

## 2023-05-07 LAB — CBC
HCT: 36.5 % — ABNORMAL LOW (ref 39.0–52.0)
Hemoglobin: 11.8 g/dL — ABNORMAL LOW (ref 13.0–17.0)
MCH: 28.5 pg (ref 26.0–34.0)
MCHC: 32.3 g/dL (ref 30.0–36.0)
MCV: 88.2 fL (ref 80.0–100.0)
Platelets: 304 10*3/uL (ref 150–400)
RBC: 4.14 MIL/uL — ABNORMAL LOW (ref 4.22–5.81)
RDW: 13.3 % (ref 11.5–15.5)
WBC: 8.7 10*3/uL (ref 4.0–10.5)
nRBC: 0 % (ref 0.0–0.2)

## 2023-05-07 LAB — BASIC METABOLIC PANEL
Anion gap: 8 (ref 5–15)
BUN: 42 mg/dL — ABNORMAL HIGH (ref 8–23)
CO2: 28 mmol/L (ref 22–32)
Calcium: 8.3 mg/dL — ABNORMAL LOW (ref 8.9–10.3)
Chloride: 100 mmol/L (ref 98–111)
Creatinine, Ser: 1.62 mg/dL — ABNORMAL HIGH (ref 0.61–1.24)
GFR, Estimated: 43 mL/min — ABNORMAL LOW (ref >60)
Glucose, Bld: 158 mg/dL — ABNORMAL HIGH (ref 70–99)
Potassium: 3.8 mmol/L (ref 3.5–5.1)
Sodium: 136 mmol/L (ref 135–145)

## 2023-05-07 LAB — ECHO TEE

## 2023-05-07 LAB — GLUCOSE, CAPILLARY
Glucose-Capillary: 143 mg/dL — ABNORMAL HIGH (ref 70–99)
Glucose-Capillary: 125 mg/dL — ABNORMAL HIGH (ref 70–99)
Glucose-Capillary: 101 mg/dL — ABNORMAL HIGH (ref 70–99)
Glucose-Capillary: 306 mg/dL — ABNORMAL HIGH (ref 70–99)
Glucose-Capillary: 380 mg/dL — ABNORMAL HIGH (ref 70–99)

## 2023-05-07 SURGERY — ECHOCARDIOGRAM, TRANSESOPHAGEAL
Anesthesia: General

## 2023-05-07 MED ORDER — MORPHINE SULFATE (PF) 2 MG/ML IV SOLN
1.0000 mg | Freq: Once | INTRAVENOUS | Status: AC
  Administered 2023-05-07: 1 mg via INTRAVENOUS
  Filled 2023-05-07: qty 1

## 2023-05-07 MED ORDER — PROPOFOL 500 MG/50ML IV EMUL
INTRAVENOUS | Status: DC | PRN
  Administered 2023-05-07: 20 mg via INTRAVENOUS
  Administered 2023-05-07: 100 ug/kg/min via INTRAVENOUS
  Administered 2023-05-07: 20 mg via INTRAVENOUS

## 2023-05-07 MED ORDER — SODIUM CHLORIDE 0.9 % IV SOLN: INTRAVENOUS | Status: DC

## 2023-05-07 NOTE — Plan of Care (Signed)
  Problem: Fluid Volume: Goal: Ability to maintain a balanced intake and output will improve Outcome: Progressing   Problem: Metabolic: Goal: Ability to maintain appropriate glucose levels will improve Outcome: Progressing   Problem: Nutritional: Goal: Maintenance of adequate nutrition will improve Outcome: Progressing   Problem: Clinical Measurements: Goal: Will remain free from infection Outcome: Progressing

## 2023-05-07 NOTE — Transfer of Care (Signed)
Immediate Anesthesia Transfer of Care Note  Patient: Christian [name]  Procedure(s) Performed: TRANSESOPHAGEAL ECHOCARDIOGRAM  Patient Location: Cath Lab  Anesthesia Type:MAC  Level of Consciousness: drowsy and patient cooperative  Airway & Oxygen Therapy: Patient Spontanous Breathing and Patient connected to nasal cannula oxygen  Post-op Assessment: Report given to RN and Post -op Vital signs reviewed and stable  Post vital signs: Reviewed and stable  Last Vitals:  Vitals Value Taken Time  BP 101/33 05/07/23 1445  Temp 36.3 C 05/07/23 1437  Pulse 77 05/07/23 1445  Resp 20 05/07/23 1445  SpO2 90 % 05/07/23 1445    Last Pain:  Vitals:   05/07/23 1437  TempSrc: Temporal  PainSc: 0-No pain      Patients Stated Pain Goal: 3 (05/07/23 1141)  Complications: No notable events documented.

## 2023-05-07 NOTE — Progress Notes (Signed)
PT Cancellation Note  Patient Details Name: DRYSTAN BOUSE MRN: [id number] DOB: 07-11-1941   Cancelled Treatment:    Reason Eval/Treat Not Completed: Patient at procedure or test/unavailable (Pt off the floor for TEE. Will follow up if time allows.)   Gladys Damme 05/07/2023, 1:40 PM

## 2023-05-07 NOTE — Progress Notes (Signed)
  Echocardiogram Echocardiogram Transesophageal has been performed.  Milda Smart 05/07/2023, 2:32 PM

## 2023-05-07 NOTE — Progress Notes (Signed)
OT Cancellation Note  Patient Details Name: Christian [name] MRN: [id number] DOB: 01/13/1941   Cancelled Treatment:    Reason Eval/Treat Not Completed: Patient at procedure or test/ unavailable  Evern Bio 05/07/2023, 2:09 PM Berna Spare, OTR/L Acute Rehabilitation Services Office: [phone]

## 2023-05-07 NOTE — Interval H&P Note (Signed)
History and Physical Interval Note:  05/07/2023 1:34 PM  Christian [name]  has presented today for surgery, with the diagnosis of bacteremia.  The various methods of treatment have been discussed with the patient and family. After consideration of risks, benefits and other options for treatment, the patient has consented to  Procedure(s): TRANSESOPHAGEAL ECHOCARDIOGRAM (N/A) as a surgical intervention.  The patient's history has been reviewed, patient examined, no change in status, stable for surgery.  I have reviewed the patient's chart and labs.  Questions were answered to the patient's satisfaction.     [employer]

## 2023-05-07 NOTE — CV Procedure (Signed)
   Transesophageal Echocardiogram  Indications: Bacteremia  Time out performed  During this procedure the patient was administered propofol under anesthesiology supervision to achieve and maintain moderate sedation.  The patient's heart rate, blood pressure, and oxygen saturation are monitored continuously during the procedure.   Findings:  Left Ventricle: Normal EF 60%  Mitral Valve: Normal, no MR  Aortic Valve: Trileaflet, normal  Tricuspid Valve: Trace TR, normal  Left Atrium: Normal, no left atrial appendage thrombus  Right Atrium: Normal  Intraatrial septum: Normal, no shunt by color flow   Bubble Contrast Study: Not performed  Impression: No vegetation.   Donato Schultz, MD

## 2023-05-07 NOTE — H&P (View-Only) (Signed)
PROGRESS NOTE    Christian [name]  ION:[id number] DOB: [birth year] DOA: 05/01/2023 PCP: Montez Hageman, DO    Brief Narrative:  Christian [name] is a 82 y.o. male with hearing difficulties and needs sign language interpreter.  He has medical history significant of hypertension, hyperlipidemia, type 2 diabetes with peripheral neuropathy, Charcot-Marie-Tooth disease, CAD, PAD, chronic HFpEF, CKD stage IIIa, history of transmetatarsal amputation of left foot, gastric ulcers, iron deficiency anemia, GERD presenting with left index finger swelling and erythema.  Found to have staph bacteremia.   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   -Blood cultures positive-- repeat negative  -OR 8/22- s/p amputation-- may need further debridement-- Per hand surgery: Dressings can be discontinued when there is no longer any active drainage  -TTE with thickening--  TEE Monday  CKD stage IIIa Mild metabolic acidosis - baseline 1.3-1.5.     Mild hyponatremia normalized   Fall at home Patient denies head injury or loss of consciousness.  No other injuries reported.  PT/OT eval- home health, fall precautions.   Type 2 diabetes -SSI   Chronic HFpEF Echo done in September 2023 showing EF 60 to 65%, grade 2 diastolic dysfunction.  No signs of volume overload at this time.  -resume lasix -check x ray for dyspnea   Chronic normocytic anemia Hemoglobin 11.8, baseline 11-13.  No signs of bleeding.  Continue to monitor labs.  No indication for blood transfusion at this time.  Informed by the patient's daughter that patient is okay with receiving blood transfusions during this hospitalization if needed.   Hypertension: Blood pressure stable. Hyperlipidemia CAD: Patient is not endorsing any anginal symptoms. PVD GERD/PUD   DVT prophylaxis: SCDs Start: 05/01/23 2229    Code Status: Full Code   Disposition Plan:  Level of  care: Telemetry Medical Status is: Inpatient Remains inpatient appropriate -- if needs long IV abx will need to go to SNF   Consultants:  ID Hand surgery Cards (TEE)   Subjective: Breathing better  Objective: Vitals:   05/06/23 2100 05/07/23 0500 05/07/23 0811 05/07/23 1000  BP: 112/66 127/69 133/70   Pulse: 85 77 81   Resp: 19 16 18    Temp: 98.7 F (37.1 C) 97.9 F (36.6 C) 97.9 F (36.6 C)   TempSrc: Oral Oral    SpO2: 96% 95% 93% 94%  Weight:      Height:        Intake/Output Summary (Last 24 hours) at 05/07/2023 1217 Last data filed at 05/07/2023 0900 Gross per 24 hour  Intake 9.82 ml  Output 1550 ml  Net -1540.18 ml   Filed Weights   05/01/23 2300  Weight: 82.1 kg    Examination:   General: Appearance:     Overweight male in no acute distress     Lungs:     Diminished, poor effort  Heart:    Normal heart rate.        Neurologic:   Awake, alert       Data Reviewed: I have personally reviewed following labs and imaging studies  CBC: Recent Labs  Lab 05/01/23 1429 05/02/23 0329 05/03/23 0955 05/04/23 0439 05/05/23 0400 05/07/23 0506  WBC 13.2* 11.8* 11.6* 9.2 9.2 8.7  NEUTROABS 10.4*  --   --   --   --   --   HGB 11.8* 14.1 12.2* 12.2* 11.0* 11.8*  HCT 35.9* 41.9 36.6* 37.5* 34.2* 36.5*  MCV 88.0 87.5 89.7 89.7 89.1  88.2  PLT 214 208 223 258 249 304   Basic Metabolic Panel: Recent Labs  Lab 05/02/23 0329 05/03/23 0955 05/04/23 0812 05/05/23 0400 05/07/23 0506  NA 132* 135 136 135 136  K 3.9 3.8 4.3 4.1 3.8  CL 99 104 103 102 100  CO2 19* 21* 21* 20* 28  GLUCOSE 215* 147* 160* 146* 158*  BUN 32* 29* 33* 33* 42*  CREATININE 1.65* 1.70* 1.73* 1.77* 1.62*  CALCIUM 8.6* 8.1* 8.2* 8.0* 8.3*  MG  --   --  1.8  --   --    GFR: Estimated Creatinine Clearance: 38.6 mL/min (A) (by C-G formula based on SCr of 1.62 mg/dL (H)). Liver Function Tests: Recent Labs  Lab 05/01/23 1429  AST 27  ALT 21  ALKPHOS 154*  BILITOT 0.9  PROT  6.6  ALBUMIN 2.9*   No results for input(s): "LIPASE", "AMYLASE" in the last 168 hours. No results for input(s): "AMMONIA" in the last 168 hours. Coagulation Profile: No results for input(s): "INR", "PROTIME" in the last 168 hours. Cardiac Enzymes: Recent Labs  Lab 05/05/23 0400  CKTOTAL 154   BNP (last 3 results) No results for input(s): "PROBNP" in the last 8760 hours. HbA1C: No results for input(s): "HGBA1C" in the last 72 hours.  CBG: Recent Labs  Lab 05/06/23 1133 05/06/23 1613 05/06/23 1949 05/07/23 0809 05/07/23 1127  GLUCAP 176* 305* 173* 143* 125*   Lipid Profile: No results for input(s): "CHOL", "HDL", "LDLCALC", "TRIG", "CHOLHDL", "LDLDIRECT" in the last 72 hours. Thyroid Function Tests: No results for input(s): "TSH", "T4TOTAL", "FREET4", "T3FREE", "THYROIDAB" in the last 72 hours. Anemia Panel: No results for input(s): "VITAMINB12", "FOLATE", "FERRITIN", "TIBC", "IRON", "RETICCTPCT" in the last 72 hours. Sepsis Labs: Recent Labs  Lab 05/01/23 1440  LATICACIDVEN 1.1    Recent Results (from the past 240 hour(s))  Blood culture (routine x 2)     Status: None   Collection Time: 05/01/23  8:23 PM   Specimen: BLOOD RIGHT ARM  Result Value Ref Range Status   Specimen Description BLOOD RIGHT ARM  Final   Special Requests   Final    BOTTLES DRAWN AEROBIC AND ANAEROBIC Blood Culture results may not be optimal due to an excessive volume of blood received in culture bottles   Culture   Final    NO GROWTH 5 DAYS Performed at [facility], [address] [place], [place] [address]    Report Status 05/06/2023 FINAL  Final  Blood culture (routine x 2)     Status: Abnormal (Preliminary result)   Collection Time: 05/01/23  8:28 PM   Specimen: BLOOD LEFT ARM  Result Value Ref Range Status   Specimen Description BLOOD LEFT ARM  Final   Special Requests   Final    BOTTLES DRAWN AEROBIC AND ANAEROBIC Blood Culture results may not be optimal due to an  excessive volume of blood received in culture bottles   Culture  Setup Time   Final    GRAM POSITIVE COCCI IN CLUSTERS IN BOTH AEROBIC AND ANAEROBIC BOTTLES CRITICAL RESULT CALLED TO, READ BACK BY AND VERIFIED WITH: Neta Mends [id number] @ 2129 FH    Culture (A)  Final    METHICILLIN RESISTANT STAPHYLOCOCCUS AUREUS Sent to [facility] for further susceptibility testing. Performed at [facility], [address], [place], [place] [address]    Report Status PENDING  Incomplete   Organism ID, Bacteria METHICILLIN RESISTANT STAPHYLOCOCCUS AUREUS  Final  Susceptibility   Methicillin resistant staphylococcus aureus - MIC*    CIPROFLOXACIN >=8 RESISTANT Resistant     ERYTHROMYCIN >=8 RESISTANT Resistant     GENTAMICIN <=0.5 SENSITIVE Sensitive     OXACILLIN >=4 RESISTANT Resistant     TETRACYCLINE <=1 SENSITIVE Sensitive     VANCOMYCIN <=0.5 SENSITIVE Sensitive     TRIMETH/SULFA >=320 RESISTANT Resistant     CLINDAMYCIN <=0.25 SENSITIVE Sensitive     RIFAMPIN <=0.5 SENSITIVE Sensitive     Inducible Clindamycin NEGATIVE Sensitive     LINEZOLID 2 SENSITIVE Sensitive     * METHICILLIN RESISTANT STAPHYLOCOCCUS AUREUS  Blood Culture ID Panel (Reflexed)     Status: Abnormal   Collection Time: 05/01/23  8:28 PM  Result Value Ref Range Status   Enterococcus faecalis NOT DETECTED NOT DETECTED Final   Enterococcus Faecium NOT DETECTED NOT DETECTED Final   Listeria monocytogenes NOT DETECTED NOT DETECTED Final   Staphylococcus species DETECTED (A) NOT DETECTED Final    Comment: CRITICAL RESULT CALLED TO, READ BACK BY AND VERIFIED WITH: PHARMD Talmadge Chad [id number] @ 2129 FH    Staphylococcus aureus (BCID) DETECTED (A) NOT DETECTED Final    Comment: Methicillin (oxacillin)-resistant Staphylococcus aureus (MRSA). MRSA is predictably resistant to beta-lactam antibiotics (except ceftaroline). Preferred therapy is vancomycin unless clinically contraindicated. Patient requires contact  precautions if  hospitalized. CRITICAL RESULT CALLED TO, READ BACK BY AND VERIFIED WITH: Neta Mends [id number] @ 2129 FH    Staphylococcus epidermidis NOT DETECTED NOT DETECTED Final   Staphylococcus lugdunensis NOT DETECTED NOT DETECTED Final   Streptococcus species NOT DETECTED NOT DETECTED Final   Streptococcus agalactiae NOT DETECTED NOT DETECTED Final   Streptococcus pneumoniae NOT DETECTED NOT DETECTED Final   Streptococcus pyogenes NOT DETECTED NOT DETECTED Final   A.calcoaceticus-baumannii NOT DETECTED NOT DETECTED Final   Bacteroides fragilis NOT DETECTED NOT DETECTED Final   Enterobacterales NOT DETECTED NOT DETECTED Final   Enterobacter cloacae complex NOT DETECTED NOT DETECTED Final   Escherichia coli NOT DETECTED NOT DETECTED Final   Klebsiella aerogenes NOT DETECTED NOT DETECTED Final   Klebsiella oxytoca NOT DETECTED NOT DETECTED Final   Klebsiella pneumoniae NOT DETECTED NOT DETECTED Final   Proteus species NOT DETECTED NOT DETECTED Final   Salmonella species NOT DETECTED NOT DETECTED Final   Serratia marcescens NOT DETECTED NOT DETECTED Final   Haemophilus influenzae NOT DETECTED NOT DETECTED Final   Neisseria meningitidis NOT DETECTED NOT DETECTED Final   Pseudomonas aeruginosa NOT DETECTED NOT DETECTED Final   Stenotrophomonas maltophilia NOT DETECTED NOT DETECTED Final   Candida albicans NOT DETECTED NOT DETECTED Final   Candida auris NOT DETECTED NOT DETECTED Final   Candida glabrata NOT DETECTED NOT DETECTED Final   Candida krusei NOT DETECTED NOT DETECTED Final   Candida parapsilosis NOT DETECTED NOT DETECTED Final   Candida tropicalis NOT DETECTED NOT DETECTED Final   Cryptococcus neoformans/gattii NOT DETECTED NOT DETECTED Final   Meth resistant mecA/C and MREJ DETECTED (A) NOT DETECTED Final    Comment: CRITICAL RESULT CALLED TO, READ BACK BY AND VERIFIED WITH: Neta Mends [id number] @ 2129 FH Performed at [facility], [address] [place], [place] [address]   Culture, blood (Routine X 2) w Reflex to ID Panel     Status: None (Preliminary result)   Collection Time: 05/02/23 11:09 PM   Specimen: BLOOD  Result Value Ref Range Status   Specimen Description BLOOD BLOOD RIGHT HAND  Final   Special Requests  Final    BOTTLES DRAWN AEROBIC AND ANAEROBIC Blood Culture results may not be optimal due to an inadequate volume of blood received in culture bottles   Culture   Final    NO GROWTH 4 DAYS Performed at [facility], [address] [place], [place] [address]    Report Status PENDING  Incomplete  Culture, blood (Routine X 2) w Reflex to ID Panel     Status: None (Preliminary result)   Collection Time: 05/02/23 11:09 PM   Specimen: BLOOD  Result Value Ref Range Status   Specimen Description BLOOD BLOOD RIGHT HAND  Final   Special Requests   Final    BOTTLES DRAWN AEROBIC AND ANAEROBIC Blood Culture adequate volume   Culture   Final    NO GROWTH 4 DAYS Performed at [facility], [address], [place], [place] [address]    Report Status PENDING  Incomplete  Aerobic/Anaerobic Culture w Gram Stain (surgical/deep wound)     Status: None (Preliminary result)   Collection Time: 05/03/23  4:10 PM   Specimen: PATH Cytology Misc. fluid; Body Fluid  Result Value Ref Range Status   Specimen Description WOUND  Final   Special Requests LEFT INDEX FINGER  Final   Gram Stain   Final    NO WBC SEEN NO ORGANISMS SEEN Performed at [facility], [address], [place], [place] [address]    Culture   Final    RARE METHICILLIN RESISTANT STAPHYLOCOCCUS AUREUS NO ANAEROBES ISOLATED; CULTURE IN PROGRESS FOR 5 DAYS    Report Status PENDING  Incomplete   Organism ID, Bacteria METHICILLIN RESISTANT STAPHYLOCOCCUS AUREUS  Final      Susceptibility   Methicillin resistant staphylococcus aureus - MIC*    CIPROFLOXACIN >=8 RESISTANT Resistant     ERYTHROMYCIN >=8 RESISTANT Resistant     GENTAMICIN <=0.5  SENSITIVE Sensitive     OXACILLIN >=4 RESISTANT Resistant     TETRACYCLINE <=1 SENSITIVE Sensitive     VANCOMYCIN <=0.5 SENSITIVE Sensitive     TRIMETH/SULFA >=320 RESISTANT Resistant     CLINDAMYCIN <=0.25 SENSITIVE Sensitive     RIFAMPIN <=0.5 SENSITIVE Sensitive     Inducible Clindamycin NEGATIVE Sensitive     LINEZOLID 2 SENSITIVE Sensitive     * RARE METHICILLIN RESISTANT STAPHYLOCOCCUS AUREUS         Radiology Studies: DG CHEST PORT 1 VIEW  Result Date: 05/06/2023 CLINICAL DATA:  Dyspnea. EXAM: PORTABLE CHEST 1 VIEW COMPARISON:  03/13/2022 FINDINGS: Chronic elevation of left hemidiaphragm. Adjacent left lung base scarring. Normal heart size with stable mediastinal contours. No focal airspace disease, pleural effusion, or pneumothorax. Chronic bilateral shoulder arthropathy. IMPRESSION: Chronic elevation of left hemidiaphragm with adjacent left lung base scarring. Electronically Signed   By: Narda Rutherford M.D.   On: 05/06/2023 14:52        Scheduled Meds:  furosemide  40 mg Oral Daily   guaiFENesin  600 mg Oral BID   insulin aspart  0-15 Units Subcutaneous TID WC   insulin aspart  0-5 Units Subcutaneous QHS   isosorbide mononitrate  60 mg Oral Daily   sodium chloride  2 spray Each Nare BID   tamsulosin  0.4 mg Oral Daily   Continuous Infusions:  sodium chloride 20 mL/hr at 05/07/23 0528   DAPTOmycin (CUBICIN) 650 mg in sodium chloride 0.9 % IVPB 650 mg (05/06/23 1315)     LOS: 6 days    Time spent: 45 minutes spent on  chart review, discussion with nursing staff, consultants, updating family and interview/physical exam; more than 50% of that time was spent in counseling and/or coordination of care.    Joseph Art, DO [facility] Available via Epic secure chat 7am-7pm After these hours, please refer to coverage provider listed on [email or web address] 05/07/2023, 12:17 PM

## 2023-05-07 NOTE — Progress Notes (Addendum)
[facility] Infectious Diseases Follow Up Note  Patient Identification: Patient Name: Christian [name] MRN: [id number] Admit Date: 05/01/2023  2:12 PM Age: 82 y.o.Today's Date: 05/07/2023  Reason for Visit: MRSA bacteremia, left index finger infection  Principal Problem:   Osteomyelitis (HCC) Active Problems:   Mixed hyperlipidemia   Essential hypertension, benign   Normocytic anemia   Type 2 diabetes mellitus (HCC)   Chronic kidney disease, stage III (moderate) (HCC)   Metabolic acidosis   Hyponatremia   Chronic heart failure with preserved ejection fraction (HFpEF) (HCC)   Current antibiotics:  Daptomycin 8/24-current Vancomycin 8/20-8/22  Lines/Hardwares:   Interval Events: Continues to be afebrile,  Labs remarkable for creatinine downtrending at 1.62, WBC 8.7  Assessment 82 year old male with PMH as below including CMT, DM, deafness requiring ASL interpreter admitted with  # MRSA bacteremia 2/2 left index finger infection -8/20 blood culture 1/2 MRSA, 8/21 no growth -8/22 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  Cultures growing MRSA.  Joint of amputation reddened with some degree of cellulitis and subcutaneous fluid. Seen by Orthopedics 8/24 and no further surgical intervention recommended  - 8/22 TTE with thickened aortic valve - 8/26 TEE with no vegetations   Recommendations Continue daptomycin, isolate has been sent [facility] for daptomycin sensitivity Needs PICC  Plan for 6 weeks of IV daptomycin from 8/22 ID Pharm D to place OPAT  ID will SO, please recall if needed   OPAT  Diagnosis: MRSA osteomyelitis   Culture Result: MRSA  Allergies  Allergen Reactions   Lipitor [Atorvastatin] Other (See Comments)    unknown    OPAT Orders Discharge antibiotics to be given via PICC line Discharge antibiotics: daptomycin 650mg  iv q 24 hrs daily  Per pharmacy protocol  Duration: 6 weeks  End  Date: 06/14/23  [facility] Care Per Protocol:  Home health RN for IV administration and teaching; PICC line care and labs.    Labs weekly while on IV antibiotics: X__ CBC with differential X__ BMP __ CMP __ CRP __ ESR __ Vancomycin trough X__ CK  X__ Please pull PIC at completion of IV antibiotics __ Please leave PIC in place until doctor has seen patient or been notified  Fax weekly labs to [phone]  Clinic Follow Up Appt: 9/16 at 11: 15 pm   Rest of the management as per the primary team. Thank you for the consult. Please page with pertinent questions or concerns.  ______________________________________________________________________ Subjective patient seen and examined at the bedside. Spoke with ASL interpreter. Left hand wound is healing with no drainage.   Past Medical History:  Diagnosis Date   Angina pectoris [facility])    Nuclear stress test 8/18: EF 59, fixed inf-lat defect likely attenuation, no ischemia, Low Risk   Arthritis    CMT (Charcot-Marie-Tooth disease)    Deafness    Diverticulosis    DM (diabetes mellitus) (HCC)    HLD (hyperlipidemia)    HTN (hypertension)    Iron deficiency anemia    Multiple gastric ulcers    Overactive bladder    Ulcer     Past Surgical History:  Procedure Laterality Date   AMPUTATION Left 05/03/2023   Procedure: LEFT INDEX FINGER AMPUTATION;  Surgeon: Mack Hook, MD;  Location: [facility] OR;  Service: Orthopedics;  Laterality: Left;   COLONOSCOPY N/A 08/27/2013   Procedure: COLONOSCOPY;  Surgeon: Beverley Fiedler, MD;  Location: [facility] ENDOSCOPY;  Service: Gastroenterology;  Laterality: N/A;   ESOPHAGOGASTRODUODENOSCOPY N/A 08/27/2013  Procedure: ESOPHAGOGASTRODUODENOSCOPY (EGD);  Surgeon: Beverley Fiedler, MD;  Location: [facility];  Service: Gastroenterology;  Laterality: N/A;   FOOT AMPUTATION THROUGH METATARSAL Left    FOOT SURGERY     age 33   TOTAL HIP ARTHROPLASTY Bilateral 2002, 2003   TOTAL KNEE ARTHROPLASTY Bilateral 1999     Vitals BP 127/69 (BP Location: Right Arm)   Pulse 77   Temp 97.9 F (36.6 C) (Oral)   Resp 16   Ht 5\' 8"  (1.727 m)   Wt 82.1 kg   SpO2 95%   BMI 27.52 kg/m   Physical Exam Constitutional:  elderly male sitting in the bed, NAD    Comments:   Cardiovascular:     Rate and Rhythm: Normal rate and regular rhythm.     Heart sounds: s1s2  Pulmonary:     Effort: Pulmonary effort is normal.     Comments: Normal lung sounds   Abdominal:     Palpations: Abdomen is soft.     Tenderness: non distended and non tender   Musculoskeletal:        General: No swelling or tenderness in peripheral joints   Skin:    Comments: no rashes. Left hand surgical wound with some faint erythma nearby incision site with no frank swelling, tenderness, fluctuance, appears to be healing as expected post op  Neurological:     General: awake, alert and oriented, following commands   Psychiatric:        Mood and Affect: Mood normal.    Pertinent Microbiology Results for orders placed or performed during the hospital encounter of 05/01/23  Blood culture (routine x 2)     Status: None   Collection Time: 05/01/23  8:23 PM   Specimen: BLOOD RIGHT ARM  Result Value Ref Range Status   Specimen Description BLOOD RIGHT ARM  Final   Special Requests   Final    BOTTLES DRAWN AEROBIC AND ANAEROBIC Blood Culture results may not be optimal due to an excessive volume of blood received in culture bottles   Culture   Final    NO GROWTH 5 DAYS Performed at [facility], [address], [place], [place] [address]    Report Status 05/06/2023 FINAL  Final  Blood culture (routine x 2)     Status: Abnormal (Preliminary result)   Collection Time: 05/01/23  8:28 PM   Specimen: BLOOD LEFT ARM  Result Value Ref Range Status   Specimen Description BLOOD LEFT ARM  Final   Special Requests   Final    BOTTLES DRAWN AEROBIC AND ANAEROBIC Blood Culture results may not be optimal due to an excessive volume of  blood received in culture bottles   Culture  Setup Time   Final    GRAM POSITIVE COCCI IN CLUSTERS IN BOTH AEROBIC AND ANAEROBIC BOTTLES CRITICAL RESULT CALLED TO, READ BACK BY AND VERIFIED WITH: Neta Mends [id number] @ 2129 FH    Culture (A)  Final    METHICILLIN RESISTANT STAPHYLOCOCCUS AUREUS Sent to [facility] for further susceptibility testing. Performed at [facility], [address], [place], [place] [address]    Report Status PENDING  Incomplete   Organism ID, Bacteria METHICILLIN RESISTANT STAPHYLOCOCCUS AUREUS  Final      Susceptibility   Methicillin resistant staphylococcus aureus - MIC*    CIPROFLOXACIN >=8 RESISTANT Resistant     ERYTHROMYCIN >=8 RESISTANT Resistant     GENTAMICIN <=0.5 SENSITIVE Sensitive     OXACILLIN >=4 RESISTANT Resistant  TETRACYCLINE <=1 SENSITIVE Sensitive     VANCOMYCIN <=0.5 SENSITIVE Sensitive     TRIMETH/SULFA >=320 RESISTANT Resistant     CLINDAMYCIN <=0.25 SENSITIVE Sensitive     RIFAMPIN <=0.5 SENSITIVE Sensitive     Inducible Clindamycin NEGATIVE Sensitive     LINEZOLID 2 SENSITIVE Sensitive     * METHICILLIN RESISTANT STAPHYLOCOCCUS AUREUS  Blood Culture ID Panel (Reflexed)     Status: Abnormal   Collection Time: 05/01/23  8:28 PM  Result Value Ref Range Status   Enterococcus faecalis NOT DETECTED NOT DETECTED Final   Enterococcus Faecium NOT DETECTED NOT DETECTED Final   Listeria monocytogenes NOT DETECTED NOT DETECTED Final   Staphylococcus species DETECTED (A) NOT DETECTED Final    Comment: CRITICAL RESULT CALLED TO, READ BACK BY AND VERIFIED WITH: PHARMD Talmadge Chad [id number] @ 2129 FH    Staphylococcus aureus (BCID) DETECTED (A) NOT DETECTED Final    Comment: Methicillin (oxacillin)-resistant Staphylococcus aureus (MRSA). MRSA is predictably resistant to beta-lactam antibiotics (except ceftaroline). Preferred therapy is vancomycin unless clinically contraindicated. Patient requires contact precautions if   hospitalized. CRITICAL RESULT CALLED TO, READ BACK BY AND VERIFIED WITH: Neta Mends [id number] @ 2129 FH    Staphylococcus epidermidis NOT DETECTED NOT DETECTED Final   Staphylococcus lugdunensis NOT DETECTED NOT DETECTED Final   Streptococcus species NOT DETECTED NOT DETECTED Final   Streptococcus agalactiae NOT DETECTED NOT DETECTED Final   Streptococcus pneumoniae NOT DETECTED NOT DETECTED Final   Streptococcus pyogenes NOT DETECTED NOT DETECTED Final   A.calcoaceticus-baumannii NOT DETECTED NOT DETECTED Final   Bacteroides fragilis NOT DETECTED NOT DETECTED Final   Enterobacterales NOT DETECTED NOT DETECTED Final   Enterobacter cloacae complex NOT DETECTED NOT DETECTED Final   Escherichia coli NOT DETECTED NOT DETECTED Final   Klebsiella aerogenes NOT DETECTED NOT DETECTED Final   Klebsiella oxytoca NOT DETECTED NOT DETECTED Final   Klebsiella pneumoniae NOT DETECTED NOT DETECTED Final   Proteus species NOT DETECTED NOT DETECTED Final   Salmonella species NOT DETECTED NOT DETECTED Final   Serratia marcescens NOT DETECTED NOT DETECTED Final   Haemophilus influenzae NOT DETECTED NOT DETECTED Final   Neisseria meningitidis NOT DETECTED NOT DETECTED Final   Pseudomonas aeruginosa NOT DETECTED NOT DETECTED Final   Stenotrophomonas maltophilia NOT DETECTED NOT DETECTED Final   Candida albicans NOT DETECTED NOT DETECTED Final   Candida auris NOT DETECTED NOT DETECTED Final   Candida glabrata NOT DETECTED NOT DETECTED Final   Candida krusei NOT DETECTED NOT DETECTED Final   Candida parapsilosis NOT DETECTED NOT DETECTED Final   Candida tropicalis NOT DETECTED NOT DETECTED Final   Cryptococcus neoformans/gattii NOT DETECTED NOT DETECTED Final   Meth resistant mecA/C and MREJ DETECTED (A) NOT DETECTED Final    Comment: CRITICAL RESULT CALLED TO, READ BACK BY AND VERIFIED WITH: Neta Mends [id number] @ 2129 FH Performed at [facility], [address] [place],  [place] [address]   Culture, blood (Routine X 2) w Reflex to ID Panel     Status: None (Preliminary result)   Collection Time: 05/02/23 11:09 PM   Specimen: BLOOD  Result Value Ref Range Status   Specimen Description BLOOD BLOOD RIGHT HAND  Final   Special Requests   Final    BOTTLES DRAWN AEROBIC AND ANAEROBIC Blood Culture results may not be optimal due to an inadequate volume of blood received in culture bottles   Culture   Final    NO GROWTH 4 DAYS Performed at [facility], [address] [place], [place] [address]    Report Status PENDING  Incomplete  Culture, blood (Routine X 2) w Reflex to ID Panel     Status: None (Preliminary result)   Collection Time: 05/02/23 11:09 PM   Specimen: BLOOD  Result Value Ref Range Status   Specimen Description BLOOD BLOOD RIGHT HAND  Final   Special Requests   Final    BOTTLES DRAWN AEROBIC AND ANAEROBIC Blood Culture adequate volume   Culture   Final    NO GROWTH 4 DAYS Performed at [facility], [address] [place], [place] [address]    Report Status PENDING  Incomplete  Aerobic/Anaerobic Culture w Gram Stain (surgical/deep wound)     Status: None (Preliminary result)   Collection Time: 05/03/23  4:10 PM   Specimen: PATH Cytology Misc. fluid; Body Fluid  Result Value Ref Range Status   Specimen Description WOUND  Final   Special Requests LEFT INDEX FINGER  Final   Gram Stain   Final    NO WBC SEEN NO ORGANISMS SEEN Performed at [facility], [address], [place], [place] [address]    Culture   Final    RARE METHICILLIN RESISTANT STAPHYLOCOCCUS AUREUS NO ANAEROBES ISOLATED; CULTURE IN PROGRESS FOR 5 DAYS    Report Status PENDING  Incomplete   Organism ID, Bacteria METHICILLIN RESISTANT STAPHYLOCOCCUS AUREUS  Final      Susceptibility   Methicillin resistant staphylococcus aureus - MIC*    CIPROFLOXACIN >=8 RESISTANT Resistant     ERYTHROMYCIN >=8 RESISTANT Resistant     GENTAMICIN <=0.5 SENSITIVE Sensitive      OXACILLIN >=4 RESISTANT Resistant     TETRACYCLINE <=1 SENSITIVE Sensitive     VANCOMYCIN <=0.5 SENSITIVE Sensitive     TRIMETH/SULFA >=320 RESISTANT Resistant     CLINDAMYCIN <=0.25 SENSITIVE Sensitive     RIFAMPIN <=0.5 SENSITIVE Sensitive     Inducible Clindamycin NEGATIVE Sensitive     LINEZOLID 2 SENSITIVE Sensitive     * RARE METHICILLIN RESISTANT STAPHYLOCOCCUS AUREUS   Pertinent Lab.    Latest Ref Rng & Units 05/07/2023    5:06 AM 05/05/2023    4:00 AM 05/04/2023    4:39 AM  CBC  WBC 4.0 - 10.5 K/uL 8.7  9.2  9.2   Hemoglobin 13.0 - 17.0 g/dL 10.2  72.5  36.6   Hematocrit 39.0 - 52.0 % 36.5  34.2  37.5   Platelets 150 - 400 K/uL 304  249  258       Latest Ref Rng & Units 05/07/2023    5:06 AM 05/05/2023    4:00 AM 05/04/2023    8:12 AM  CMP  Glucose 70 - 99 mg/dL 440  347  425   BUN 8 - 23 mg/dL 42  33  33   Creatinine 0.61 - 1.24 mg/dL 9.56  3.87  5.64   Sodium 135 - 145 mmol/L 136  135  136   Potassium 3.5 - 5.1 mmol/L 3.8  4.1  4.3   Chloride 98 - 111 mmol/L 100  102  103   CO2 22 - 32 mmol/L 28  20  21    Calcium 8.9 - 10.3 mg/dL 8.3  8.0  8.2      Pertinent Imaging today Plain films and CT images have been personally visualized and interpreted; radiology reports have been reviewed. Decision making incorporated into the Impression /   EP STUDY  Result Date: 05/07/2023  See surgical note for result.  DG CHEST PORT 1 VIEW  Result Date: 05/06/2023 CLINICAL DATA:  Dyspnea. EXAM: PORTABLE CHEST 1 VIEW COMPARISON:  03/13/2022 FINDINGS: Chronic elevation of left hemidiaphragm. Adjacent left lung base scarring. Normal heart size with stable mediastinal contours. No focal airspace disease, pleural effusion, or pneumothorax. Chronic bilateral shoulder arthropathy. IMPRESSION: Chronic elevation of left hemidiaphragm with adjacent left lung base scarring. Electronically Signed   By: Narda Rutherford M.D.   On: 05/06/2023 14:52   ECHOCARDIOGRAM COMPLETE  Result Date:  05/03/2023    ECHOCARDIOGRAM REPORT   Patient Name:   Christian [name] Date of Exam: 05/03/2023 Medical Rec #:  [id number]        Height:       68.0 in Accession #:    [id number]       Weight:       181.0 lb Date of Birth:  [birth year]       BSA:          1.959 m Patient Age:    49 years         BP:           136/69 mmHg Patient Gender: M                HR:           84 bpm. Exam Location:  Inpatient Procedure: 2D Echo, Cardiac Doppler and Color Doppler Indications:    Bacteremia R78.81  History:        Patient has prior history of Echocardiogram examinations, most                 recent 05/30/2022. CHF, Angina; Risk Factors:Hypertension and                 Diabetes. CKD, stage 3.  Sonographer:    Lucendia Herrlich Referring Phys: [id number] CORNELIUS N VAN DAM IMPRESSIONS  1. Left ventricular ejection fraction, by estimation, is 60 to 65%. The left ventricle has normal function. The left ventricle has no regional wall motion abnormalities. Left ventricular diastolic parameters were normal.  2. Right ventricular systolic function is normal. The right ventricular size is normal. There is normal pulmonary artery systolic pressure.  3. The mitral valve is degenerative. Trivial mitral valve regurgitation. No evidence of mitral stenosis.  4. The aortic valve was not well visualized. There is mild calcification of the aortic valve. There is mild thickening of the aortic valve. Aortic valve regurgitation is not visualized. Aortic valve sclerosis is present, with no evidence of aortic valve  stenosis.  5. The inferior vena cava is dilated in size with >50% respiratory variability, suggesting right atrial pressure of 8 mmHg. Comparison(s): No significant change from prior study. Prior images reviewed side by side. Conclusion(s)/Recommendation(s): No evidence of valvular vegetations on this transthoracic echocardiogram. Consider a transesophageal echocardiogram to exclude infective endocarditis if clinically indicated. FINDINGS  Left  Ventricle: Left ventricular ejection fraction, by estimation, is 60 to 65%. The left ventricle has normal function. The left ventricle has no regional wall motion abnormalities. The left ventricular internal cavity size was normal in size. There is  no left ventricular hypertrophy. Left ventricular diastolic parameters were normal. Right Ventricle: The right ventricular size is normal. Right vetricular wall thickness was not well visualized. Right ventricular systolic function is normal. There is normal pulmonary artery systolic pressure. The tricuspid regurgitant velocity is 1.04 m/s, and with an assumed right atrial pressure of 8 mmHg, the estimated right ventricular systolic  pressure is 12.3 mmHg. Left Atrium: Left atrial size was normal in size. Right Atrium: Right atrial size was normal in size. Pericardium: There is no evidence of pericardial effusion. Mitral Valve: The mitral valve is degenerative in appearance. There is mild thickening of the mitral valve leaflet(s). There is mild calcification of the mitral valve leaflet(s). Trivial mitral valve regurgitation. No evidence of mitral valve stenosis. MV peak gradient, 8.2 mmHg. The mean mitral valve gradient is 3.5 mmHg. Tricuspid Valve: The tricuspid valve is not well visualized. Tricuspid valve regurgitation is trivial. No evidence of tricuspid stenosis. Aortic Valve: The aortic valve was not well visualized. There is mild calcification of the aortic valve. There is mild thickening of the aortic valve. Aortic valve regurgitation is not visualized. Aortic valve sclerosis is present, with no evidence of aortic valve stenosis. Aortic valve peak gradient measures 9.2 mmHg. Pulmonic Valve: The pulmonic valve was not well visualized. Pulmonic valve regurgitation is not visualized. Aorta: The aortic root is normal in size and structure. Venous: The inferior vena cava is dilated in size with greater than 50% respiratory variability, suggesting right atrial pressure  of 8 mmHg. IAS/Shunts: The atrial septum is grossly normal.  LEFT VENTRICLE PLAX 2D LVIDd:         4.50 cm   Diastology LVIDs:         2.90 cm   LV e' medial:    6.84 cm/s LV PW:         1.00 cm   LV E/e' medial:  13.9 LV IVS:        0.80 cm   LV e' lateral:   11.70 cm/s LVOT diam:     2.10 cm   LV E/e' lateral: 8.1 LV SV:         67 LV SV Index:   34 LVOT Area:     3.46 cm  RIGHT VENTRICLE             IVC RV S prime:     16.60 cm/s  IVC diam: 2.80 cm TAPSE (M-mode): 2.2 cm LEFT ATRIUM             Index        RIGHT ATRIUM           Index LA diam:        2.30 cm 1.17 cm/m   RA Area:     10.50 cm LA Vol (A2C):   37.7 ml 19.25 ml/m  RA Volume:   17.60 ml  8.98 ml/m LA Vol (A4C):   50.9 ml 25.98 ml/m LA Biplane Vol: 44.5 ml 22.72 ml/m  AORTIC VALVE AV Area (Vmax): 2.32 cm AV Vmax:        152.00 cm/s AV Peak Grad:   9.2 mmHg LVOT Vmax:      101.80 cm/s LVOT Vmean:     64.000 cm/s LVOT VTI:       0.194 m  AORTA Ao Root diam: 3.40 cm Ao Asc diam:  2.90 cm MITRAL VALVE                TRICUSPID VALVE MV Area (PHT): 3.21 cm     TR Peak grad:   4.3 mmHg MV Area VTI:   1.55 cm     TR Vmax:        104.00 cm/s MV Peak grad:  8.2 mmHg MV Mean grad:  3.5 mmHg     SHUNTS MV Vmax:       1.44 m/s  Systemic VTI:  0.19 m MV Vmean:      81.9 cm/s    Systemic Diam: 2.10 cm MV Decel Time: 236 msec MV E velocity: 94.90 cm/s MV A velocity: 138.00 cm/s MV E/A ratio:  0.69 Jodelle Red MD Electronically signed by Jodelle Red MD Signature Date/Time: 05/03/2023/2:11:43 PM    Final    DG Finger Index Left  Result Date: 05/01/2023 CLINICAL DATA:  Left index finger pain and swelling with clinical symptoms of cellulitis. Diabetes. EXAM: LEFT INDEX FINGER 2+V COMPARISON:  None Available. FINDINGS: Marked diffuse soft tissue swelling of the index finger with dorsal soft tissue irregularity distally with overlying bandage material. Bone destruction and fragmentation involving the distal aspect of the 2nd distal  phalanx. 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 Extensive erosive and cystic changes involving all of the carpals, bases of the metacarpals and distal radius and ulna. Are also radiocarpal degenerative changes and distal radius and ulna fragmentation. IMPRESSION: 1. Osteomyelitis involving the distal aspect of the 2nd distal phalanx with dorsal soft tissue ulceration/irregularity. 2. 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 Electronically Signed   By: Beckie Salts M.D.   On: 05/01/2023 16:38     I have personally spent 53 minutes involved in face-to-face and non-face-to-face activities for this patient on the day of the visit. Professional time spent includes the following activities: Preparing to see the patient (review of tests), Obtaining and/or reviewing separately obtained history (admission/discharge record), Performing a medically appropriate examination and/or evaluation , Ordering medications/tests/procedures, referring and communicating with other health care professionals, Documenting clinical information in the EMR, Independently interpreting results (not separately reported), Communicating results to the patient/family/caregiver, Counseling and educating the patient/family/caregiver and Care coordination (not separately reported).   Plan d/w requesting provider as well as ID pharm D  Note: This document was prepared using dragon voice recognition software and may include unintentional dictation errors.   Electronically signed by:   Odette Fraction, MD Infectious Disease Physician [facility] Infectious Disease Pager: [phone]

## 2023-05-07 NOTE — Progress Notes (Deleted)
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 Blood cultures from 05/02/23 have been without growth. Surgical specimen growing MRSA (R-Bactrim). Surgical wound healing well. TEE planned today to rule out endocarditis given suspected chronic nature of infection. Anticipate 6 weeks of antibiotics via PICC line. Tolerating Daptomycin with no adverse side effect and last CK level 154. Disposition pending. Continue post-operative wound care per Orthopedics with remaining medical and supportive care per Internal Medicine.   PLAN:  Continue current dose of daptomycin.  Post-operative wound care per Orthopedics.  Await TEE to rule out endocarditis. Continue therapeutic drug monitoring of CK levels while on Daptomycin. PICC line placement prior to discharge. Remaining medical and supportive care per Internal Medicine.   I have personally 27 spent  minutes involved in face-to-face and non-face-to-face activities for this patient on the day of the visit. Professional time spent includes the following activities: Preparing to see the patient (review of tests), Obtaining and/or reviewing separately obtained history (admission/discharge record), Performing a medically appropriate examination and/or evaluation , Ordering medications/tests/procedures, referring and communicating with other health care professionals, Documenting clinical information in the EMR, Independently interpreting results (not separately reported), Communicating results to the patient/family/caregiver, Counseling and educating the patient/family/caregiver and Care coordination (not separately reported).    Principal Problem:   Osteomyelitis (HCC) Active Problems:   Mixed hyperlipidemia   Essential hypertension,  benign   Normocytic anemia   Type 2 diabetes mellitus (HCC)   Chronic kidney disease, stage III (moderate) (HCC)   Metabolic acidosis   Hyponatremia   Chronic heart failure with preserved ejection fraction (HFpEF) (HCC)    furosemide  40 mg Oral Daily   guaiFENesin  600 mg Oral BID   insulin aspart  0-15 Units Subcutaneous TID WC   insulin aspart  0-5 Units Subcutaneous QHS   isosorbide mononitrate  60 mg Oral Daily   sodium chloride  2 spray Each Nare BID   tamsulosin  0.4 mg Oral Daily    SUBJECTIVE:  Afebrile overnight with no acute events. 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   Allergies  Allergen Reactions   Lipitor [Atorvastatin] Other (See Comments)    unknown     Review of Systems: Review of Systems  Constitutional:  Negative for chills and fever.  Respiratory:  Negative for cough, shortness of breath and wheezing.   Cardiovascular:  Negative for chest pain.  Gastrointestinal:  Negative for abdominal pain, constipation, diarrhea, nausea and vomiting.  Skin:  Negative for rash.      OBJECTIVE: Vitals:   05/06/23 2100 05/07/23 0500 05/07/23 0811 05/07/23 1000  BP: 112/66 127/69 133/70   Pulse: 85 77 81   Resp: 19 16 18    Temp: 98.7 F (37.1 C) 97.9 F (36.6 C) 97.9 F (36.6 C)   TempSrc: Oral Oral    SpO2: 96% 95% 93% 94%  Weight:      Height:       Body mass index is 27.52 kg/m.  Physical Exam Constitutional:      General: He is not in acute distress.    Appearance: He is well-developed.     Comments: Laying in bed with head of bed elevated;  pleasant.   Cardiovascular:     Rate and Rhythm: Normal rate and regular rhythm.     Heart sounds: Normal heart sounds.  Pulmonary:     Effort: Pulmonary effort is normal.     Breath sounds: Normal breath sounds.  Musculoskeletal:     Comments: Post-surgical dressing clean, dry and intact. Surgical site clean and dry, well approximated with  sutures.   Skin:    General: Skin is warm and dry.  Neurological:     Mental Status: He is alert.  Psychiatric:        Mood and Affect: Mood normal.     Lab Results Lab Results  Component Value Date   WBC 8.7 05/07/2023   HGB 11.8 (L) 05/07/2023   HCT 36.5 (L) 05/07/2023   MCV 88.2 05/07/2023   PLT 304 05/07/2023    Lab Results  Component Value Date   CREATININE 1.62 (H) 05/07/2023   BUN 42 (H) 05/07/2023   NA 136 05/07/2023   K 3.8 05/07/2023   CL 100 05/07/2023   CO2 28 05/07/2023    Lab Results  Component Value Date   ALT 21 05/01/2023   AST 27 05/01/2023   ALKPHOS 154 (H) 05/01/2023   BILITOT 0.9 05/01/2023     Microbiology: Recent Results (from the past 240 hour(s))  Blood culture (routine x 2)     Status: None   Collection Time: 05/01/23  8:23 PM   Specimen: BLOOD RIGHT ARM  Result Value Ref Range Status   Specimen Description BLOOD RIGHT ARM  Final   Special Requests   Final    BOTTLES DRAWN AEROBIC AND ANAEROBIC Blood Culture results may not be optimal due to an excessive volume of blood received in culture bottles   Culture   Final    NO GROWTH 5 DAYS Performed at [facility], [address], [place], [place] [address]    Report Status 05/06/2023 FINAL  Final  Blood culture (routine x 2)     Status: Abnormal (Preliminary result)   Collection Time: 05/01/23  8:28 PM   Specimen: BLOOD LEFT ARM  Result Value Ref Range Status   Specimen Description BLOOD LEFT ARM  Final   Special Requests   Final    BOTTLES DRAWN AEROBIC AND ANAEROBIC Blood Culture results may not be optimal due to an excessive volume of blood received in culture bottles   Culture  Setup Time   Final    GRAM POSITIVE COCCI IN CLUSTERS IN BOTH AEROBIC AND ANAEROBIC BOTTLES CRITICAL RESULT CALLED TO, READ BACK BY AND VERIFIED WITH: Neta Mends [id number] @ 2129 FH    Culture (A)  Final    METHICILLIN RESISTANT STAPHYLOCOCCUS AUREUS Sent to [facility] for further  susceptibility testing. Performed at [facility], [address] [place], [place] [address]    Report Status PENDING  Incomplete   Organism ID, Bacteria METHICILLIN RESISTANT STAPHYLOCOCCUS AUREUS  Final      Susceptibility   Methicillin resistant staphylococcus aureus - MIC*    CIPROFLOXACIN >=8 RESISTANT Resistant     ERYTHROMYCIN >=8 RESISTANT Resistant     GENTAMICIN <=0.5 SENSITIVE Sensitive     OXACILLIN >=4 RESISTANT Resistant     TETRACYCLINE <=1 SENSITIVE Sensitive     VANCOMYCIN <=0.5 SENSITIVE Sensitive     TRIMETH/SULFA >=320 RESISTANT Resistant     CLINDAMYCIN <=0.25 SENSITIVE Sensitive     RIFAMPIN <=0.5 SENSITIVE Sensitive     Inducible Clindamycin NEGATIVE Sensitive  LINEZOLID 2 SENSITIVE Sensitive     * METHICILLIN RESISTANT STAPHYLOCOCCUS AUREUS  Blood Culture ID Panel (Reflexed)     Status: Abnormal   Collection Time: 05/01/23  8:28 PM  Result Value Ref Range Status   Enterococcus faecalis NOT DETECTED NOT DETECTED Final   Enterococcus Faecium NOT DETECTED NOT DETECTED Final   Listeria monocytogenes NOT DETECTED NOT DETECTED Final   Staphylococcus species DETECTED (A) NOT DETECTED Final    Comment: CRITICAL RESULT CALLED TO, READ BACK BY AND VERIFIED WITH: PHARMD Talmadge Chad [id number] @ 2129 FH    Staphylococcus aureus (BCID) DETECTED (A) NOT DETECTED Final    Comment: Methicillin (oxacillin)-resistant Staphylococcus aureus (MRSA). MRSA is predictably resistant to beta-lactam antibiotics (except ceftaroline). Preferred therapy is vancomycin unless clinically contraindicated. Patient requires contact precautions if  hospitalized. CRITICAL RESULT CALLED TO, READ BACK BY AND VERIFIED WITH: Neta Mends [id number] @ 2129 FH    Staphylococcus epidermidis NOT DETECTED NOT DETECTED Final   Staphylococcus lugdunensis NOT DETECTED NOT DETECTED Final   Streptococcus species NOT DETECTED NOT DETECTED Final   Streptococcus agalactiae NOT DETECTED NOT  DETECTED Final   Streptococcus pneumoniae NOT DETECTED NOT DETECTED Final   Streptococcus pyogenes NOT DETECTED NOT DETECTED Final   A.calcoaceticus-baumannii NOT DETECTED NOT DETECTED Final   Bacteroides fragilis NOT DETECTED NOT DETECTED Final   Enterobacterales NOT DETECTED NOT DETECTED Final   Enterobacter cloacae complex NOT DETECTED NOT DETECTED Final   Escherichia coli NOT DETECTED NOT DETECTED Final   Klebsiella aerogenes NOT DETECTED NOT DETECTED Final   Klebsiella oxytoca NOT DETECTED NOT DETECTED Final   Klebsiella pneumoniae NOT DETECTED NOT DETECTED Final   Proteus species NOT DETECTED NOT DETECTED Final   Salmonella species NOT DETECTED NOT DETECTED Final   Serratia marcescens NOT DETECTED NOT DETECTED Final   Haemophilus influenzae NOT DETECTED NOT DETECTED Final   Neisseria meningitidis NOT DETECTED NOT DETECTED Final   Pseudomonas aeruginosa NOT DETECTED NOT DETECTED Final   Stenotrophomonas maltophilia NOT DETECTED NOT DETECTED Final   Candida albicans NOT DETECTED NOT DETECTED Final   Candida auris NOT DETECTED NOT DETECTED Final   Candida glabrata NOT DETECTED NOT DETECTED Final   Candida krusei NOT DETECTED NOT DETECTED Final   Candida parapsilosis NOT DETECTED NOT DETECTED Final   Candida tropicalis NOT DETECTED NOT DETECTED Final   Cryptococcus neoformans/gattii NOT DETECTED NOT DETECTED Final   Meth resistant mecA/C and MREJ DETECTED (A) NOT DETECTED Final    Comment: CRITICAL RESULT CALLED TO, READ BACK BY AND VERIFIED WITH: Neta Mends [id number] @ 2129 FH Performed at [facility], [address], [place], [place] [address]   Culture, blood (Routine X 2) w Reflex to ID Panel     Status: None (Preliminary result)   Collection Time: 05/02/23 11:09 PM   Specimen: BLOOD  Result Value Ref Range Status   Specimen Description BLOOD BLOOD RIGHT HAND  Final   Special Requests   Final    BOTTLES DRAWN AEROBIC AND ANAEROBIC Blood Culture results may not  be optimal due to an inadequate volume of blood received in culture bottles   Culture   Final    NO GROWTH 4 DAYS Performed at [facility], [address] [place], [place] [address]    Report Status PENDING  Incomplete  Culture, blood (Routine X 2) w Reflex to ID Panel     Status: None (Preliminary result)   Collection Time: 05/02/23 11:09 PM   Specimen: BLOOD  Result Value Ref Range Status   Specimen Description BLOOD BLOOD RIGHT HAND  Final   Special Requests   Final    BOTTLES DRAWN AEROBIC AND ANAEROBIC Blood Culture adequate volume   Culture   Final    NO GROWTH 4 DAYS Performed at [facility], [address], [place], [place] [address]    Report Status PENDING  Incomplete  Aerobic/Anaerobic Culture w Gram Stain (surgical/deep wound)     Status: None (Preliminary result)   Collection Time: 05/03/23  4:10 PM   Specimen: PATH Cytology Misc. fluid; Body Fluid  Result Value Ref Range Status   Specimen Description WOUND  Final   Special Requests LEFT INDEX FINGER  Final   Gram Stain   Final    NO WBC SEEN NO ORGANISMS SEEN Performed at [facility], [address], [place], [place] [address]    Culture   Final    RARE METHICILLIN RESISTANT STAPHYLOCOCCUS AUREUS NO ANAEROBES ISOLATED; CULTURE IN PROGRESS FOR 5 DAYS    Report Status PENDING  Incomplete   Organism ID, Bacteria METHICILLIN RESISTANT STAPHYLOCOCCUS AUREUS  Final      Susceptibility   Methicillin resistant staphylococcus aureus - MIC*    CIPROFLOXACIN >=8 RESISTANT Resistant     ERYTHROMYCIN >=8 RESISTANT Resistant     GENTAMICIN <=0.5 SENSITIVE Sensitive     OXACILLIN >=4 RESISTANT Resistant     TETRACYCLINE <=1 SENSITIVE Sensitive     VANCOMYCIN <=0.5 SENSITIVE Sensitive     TRIMETH/SULFA >=320 RESISTANT Resistant     CLINDAMYCIN <=0.25 SENSITIVE Sensitive     RIFAMPIN <=0.5 SENSITIVE Sensitive     Inducible Clindamycin NEGATIVE Sensitive     LINEZOLID 2 SENSITIVE Sensitive     * RARE  METHICILLIN RESISTANT STAPHYLOCOCCUS AUREUS     Marcos Eke, NP [facility]  05/07/2023  10:50 AM

## 2023-05-07 NOTE — Anesthesia Preprocedure Evaluation (Addendum)
Anesthesia Evaluation  Patient identified by MRN, date of birth, ID band Patient awake    Reviewed: Allergy & Precautions, H&P , NPO status , Patient's Chart, lab work & pertinent test results  Airway Mallampati: II  TM Distance: >3 FB Neck ROM: Limited    Dental  (+) Dental Advisory Given   Pulmonary neg pulmonary ROS   Pulmonary exam normal breath sounds clear to auscultation       Cardiovascular hypertension, + angina  + Peripheral Vascular Disease  Normal cardiovascular exam Rhythm:Regular Rate:Normal  Echo 04/2023  1. Left ventricular ejection fraction, by estimation, is 60 to 65%. The left ventricle has normal function. The left ventricle has no regional wall motion abnormalities. Left ventricular diastolic parameters were normal.   2. Right ventricular systolic function is normal. The right ventricular size is normal. There is normal pulmonary artery systolic pressure.   3. The mitral valve is degenerative. Trivial mitral valve regurgitation. No evidence of mitral stenosis.   4. The aortic valve was not well visualized. There is mild calcification of the aortic valve. There is mild thickening of the aortic valve. Aortic valve regurgitation is not visualized. Aortic valve sclerosis is present, with no evidence of aortic valve   stenosis.   5. The inferior vena cava is dilated in size with >50% respiratory variability, suggesting right atrial pressure of 8 mmHg.   Comparison(s): No significant change from prior study. Prior images  reviewed side by side.    Stress MPS 2018 The left ventricular ejection fraction is normal (55-65%).  Nuclear stress EF: 59%.  There was no ST segment deviation noted during stress.  This is a low risk study.   1. EF 59%, normal wall motion.  2. Fixed medium-sized, mild basal to mid inferolateral perfusion defect.  Given normal wall motion, this may represent attenuation.  No evidence for  ischemia.       Neuro/Psych   Anxiety      Neuromuscular disease    GI/Hepatic PUD,GERD  ,,  Endo/Other  diabetes, Type 2    Renal/GU Renal disease     Musculoskeletal  (+) Arthritis ,    Abdominal   Peds  Hematology  (+) Blood dyscrasia, anemia   Anesthesia Other Findings   Reproductive/Obstetrics                             Anesthesia Physical Anesthesia Plan  ASA: 3  Anesthesia Plan: MAC   Post-op Pain Management: Minimal or no pain anticipated   Induction: Intravenous  PONV Risk Score and Plan: 2 and Ondansetron, Dexamethasone and Treatment may vary due to age or medical condition  Airway Management Planned:   Additional Equipment:   Intra-op Plan:   Post-operative Plan:   Informed Consent: I have reviewed the patients History and Physical, chart, labs and discussed the procedure including the risks, benefits and alternatives for the proposed anesthesia with the patient or authorized representative who has indicated his/her understanding and acceptance.     Dental advisory given  Plan Discussed with: CRNA  Anesthesia Plan Comments:        Anesthesia Quick Evaluation

## 2023-05-07 NOTE — Progress Notes (Signed)
PROGRESS NOTE    Christian [name]  ZOX:[id number] DOB: 1941/07/26 DOA: 05/01/2023 PCP: Montez Hageman, DO    Brief Narrative:  Christian [name] is a 82 y.o. male with hearing difficulties and needs sign language interpreter.  He has medical history significant of hypertension, hyperlipidemia, type 2 diabetes with peripheral neuropathy, Charcot-Marie-Tooth disease, CAD, PAD, chronic HFpEF, CKD stage IIIa, history of transmetatarsal amputation of left foot, gastric ulcers, iron deficiency anemia, GERD presenting with left index finger swelling and erythema.  Found to have staph bacteremia.   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   -Blood cultures positive-- repeat negative  -OR 8/22- s/p amputation-- may need further debridement-- Per hand surgery: Dressings can be discontinued when there is no longer any active drainage  -TTE with thickening--  TEE Monday  CKD stage IIIa Mild metabolic acidosis - baseline 1.3-1.5.     Mild hyponatremia normalized   Fall at home Patient denies head injury or loss of consciousness.  No other injuries reported.  PT/OT eval- home health, fall precautions.   Type 2 diabetes -SSI   Chronic HFpEF Echo done in September 2023 showing EF 60 to 65%, grade 2 diastolic dysfunction.  No signs of volume overload at this time.  -resume lasix -check x ray for dyspnea   Chronic normocytic anemia Hemoglobin 11.8, baseline 11-13.  No signs of bleeding.  Continue to monitor labs.  No indication for blood transfusion at this time.  Informed by the patient's daughter that patient is okay with receiving blood transfusions during this hospitalization if needed.   Hypertension: Blood pressure stable. Hyperlipidemia CAD: Patient is not endorsing any anginal symptoms. PVD GERD/PUD   DVT prophylaxis: SCDs Start: 05/01/23 2229    Code Status: Full Code   Disposition Plan:  Level of  care: Telemetry Medical Status is: Inpatient Remains inpatient appropriate -- if needs long IV abx will need to go to SNF   Consultants:  ID Hand surgery Cards (TEE)   Subjective: Breathing better  Objective: Vitals:   05/06/23 2100 05/07/23 0500 05/07/23 0811 05/07/23 1000  BP: 112/66 127/69 133/70   Pulse: 85 77 81   Resp: 19 16 18    Temp: 98.7 F (37.1 C) 97.9 F (36.6 C) 97.9 F (36.6 C)   TempSrc: Oral Oral    SpO2: 96% 95% 93% 94%  Weight:      Height:        Intake/Output Summary (Last 24 hours) at 05/07/2023 1217 Last data filed at 05/07/2023 0900 Gross per 24 hour  Intake 9.82 ml  Output 1550 ml  Net -1540.18 ml   Filed Weights   05/01/23 2300  Weight: 82.1 kg    Examination:   General: Appearance:     Overweight male in no acute distress     Lungs:     Diminished, poor effort  Heart:    Normal heart rate.        Neurologic:   Awake, alert       Data Reviewed: I have personally reviewed following labs and imaging studies  CBC: Recent Labs  Lab 05/01/23 1429 05/02/23 0329 05/03/23 0955 05/04/23 0439 05/05/23 0400 05/07/23 0506  WBC 13.2* 11.8* 11.6* 9.2 9.2 8.7  NEUTROABS 10.4*  --   --   --   --   --   HGB 11.8* 14.1 12.2* 12.2* 11.0* 11.8*  HCT 35.9* 41.9 36.6* 37.5* 34.2* 36.5*  MCV 88.0 87.5 89.7 89.7 89.1  88.2  PLT 214 208 223 258 249 304   Basic Metabolic Panel: Recent Labs  Lab 05/02/23 0329 05/03/23 0955 05/04/23 0812 05/05/23 0400 05/07/23 0506  NA 132* 135 136 135 136  K 3.9 3.8 4.3 4.1 3.8  CL 99 104 103 102 100  CO2 19* 21* 21* 20* 28  GLUCOSE 215* 147* 160* 146* 158*  BUN 32* 29* 33* 33* 42*  CREATININE 1.65* 1.70* 1.73* 1.77* 1.62*  CALCIUM 8.6* 8.1* 8.2* 8.0* 8.3*  MG  --   --  1.8  --   --    GFR: Estimated Creatinine Clearance: 38.6 mL/min (A) (by C-G formula based on SCr of 1.62 mg/dL (H)). Liver Function Tests: Recent Labs  Lab 05/01/23 1429  AST 27  ALT 21  ALKPHOS 154*  BILITOT 0.9  PROT  6.6  ALBUMIN 2.9*   No results for input(s): "LIPASE", "AMYLASE" in the last 168 hours. No results for input(s): "AMMONIA" in the last 168 hours. Coagulation Profile: No results for input(s): "INR", "PROTIME" in the last 168 hours. Cardiac Enzymes: Recent Labs  Lab 05/05/23 0400  CKTOTAL 154   BNP (last 3 results) No results for input(s): "PROBNP" in the last 8760 hours. HbA1C: No results for input(s): "HGBA1C" in the last 72 hours.  CBG: Recent Labs  Lab 05/06/23 1133 05/06/23 1613 05/06/23 1949 05/07/23 0809 05/07/23 1127  GLUCAP 176* 305* 173* 143* 125*   Lipid Profile: No results for input(s): "CHOL", "HDL", "LDLCALC", "TRIG", "CHOLHDL", "LDLDIRECT" in the last 72 hours. Thyroid Function Tests: No results for input(s): "TSH", "T4TOTAL", "FREET4", "T3FREE", "THYROIDAB" in the last 72 hours. Anemia Panel: No results for input(s): "VITAMINB12", "FOLATE", "FERRITIN", "TIBC", "IRON", "RETICCTPCT" in the last 72 hours. Sepsis Labs: Recent Labs  Lab 05/01/23 1440  LATICACIDVEN 1.1    Recent Results (from the past 240 hour(s))  Blood culture (routine x 2)     Status: None   Collection Time: 05/01/23  8:23 PM   Specimen: BLOOD RIGHT ARM  Result Value Ref Range Status   Specimen Description BLOOD RIGHT ARM  Final   Special Requests   Final    BOTTLES DRAWN AEROBIC AND ANAEROBIC Blood Culture results may not be optimal due to an excessive volume of blood received in culture bottles   Culture   Final    NO GROWTH 5 DAYS Performed at [facility], [address], [place], [place] [address]    Report Status 05/06/2023 FINAL  Final  Blood culture (routine x 2)     Status: Abnormal (Preliminary result)   Collection Time: 05/01/23  8:28 PM   Specimen: BLOOD LEFT ARM  Result Value Ref Range Status   Specimen Description BLOOD LEFT ARM  Final   Special Requests   Final    BOTTLES DRAWN AEROBIC AND ANAEROBIC Blood Culture results may not be optimal due to an  excessive volume of blood received in culture bottles   Culture  Setup Time   Final    GRAM POSITIVE COCCI IN CLUSTERS IN BOTH AEROBIC AND ANAEROBIC BOTTLES CRITICAL RESULT CALLED TO, READ BACK BY AND VERIFIED WITH: Neta Mends [id number] @ 2129 FH    Culture (A)  Final    METHICILLIN RESISTANT STAPHYLOCOCCUS AUREUS Sent to [facility] for further susceptibility testing. Performed at [facility], [address], [place], [place] [address]    Report Status PENDING  Incomplete   Organism ID, Bacteria METHICILLIN RESISTANT STAPHYLOCOCCUS AUREUS  Final  Susceptibility   Methicillin resistant staphylococcus aureus - MIC*    CIPROFLOXACIN >=8 RESISTANT Resistant     ERYTHROMYCIN >=8 RESISTANT Resistant     GENTAMICIN <=0.5 SENSITIVE Sensitive     OXACILLIN >=4 RESISTANT Resistant     TETRACYCLINE <=1 SENSITIVE Sensitive     VANCOMYCIN <=0.5 SENSITIVE Sensitive     TRIMETH/SULFA >=320 RESISTANT Resistant     CLINDAMYCIN <=0.25 SENSITIVE Sensitive     RIFAMPIN <=0.5 SENSITIVE Sensitive     Inducible Clindamycin NEGATIVE Sensitive     LINEZOLID 2 SENSITIVE Sensitive     * METHICILLIN RESISTANT STAPHYLOCOCCUS AUREUS  Blood Culture ID Panel (Reflexed)     Status: Abnormal   Collection Time: 05/01/23  8:28 PM  Result Value Ref Range Status   Enterococcus faecalis NOT DETECTED NOT DETECTED Final   Enterococcus Faecium NOT DETECTED NOT DETECTED Final   Listeria monocytogenes NOT DETECTED NOT DETECTED Final   Staphylococcus species DETECTED (A) NOT DETECTED Final    Comment: CRITICAL RESULT CALLED TO, READ BACK BY AND VERIFIED WITH: PHARMD Talmadge Chad [id number] @ 2129 FH    Staphylococcus aureus (BCID) DETECTED (A) NOT DETECTED Final    Comment: Methicillin (oxacillin)-resistant Staphylococcus aureus (MRSA). MRSA is predictably resistant to beta-lactam antibiotics (except ceftaroline). Preferred therapy is vancomycin unless clinically contraindicated. Patient requires contact  precautions if  hospitalized. CRITICAL RESULT CALLED TO, READ BACK BY AND VERIFIED WITH: Neta Mends [id number] @ 2129 FH    Staphylococcus epidermidis NOT DETECTED NOT DETECTED Final   Staphylococcus lugdunensis NOT DETECTED NOT DETECTED Final   Streptococcus species NOT DETECTED NOT DETECTED Final   Streptococcus agalactiae NOT DETECTED NOT DETECTED Final   Streptococcus pneumoniae NOT DETECTED NOT DETECTED Final   Streptococcus pyogenes NOT DETECTED NOT DETECTED Final   A.calcoaceticus-baumannii NOT DETECTED NOT DETECTED Final   Bacteroides fragilis NOT DETECTED NOT DETECTED Final   Enterobacterales NOT DETECTED NOT DETECTED Final   Enterobacter cloacae complex NOT DETECTED NOT DETECTED Final   Escherichia coli NOT DETECTED NOT DETECTED Final   Klebsiella aerogenes NOT DETECTED NOT DETECTED Final   Klebsiella oxytoca NOT DETECTED NOT DETECTED Final   Klebsiella pneumoniae NOT DETECTED NOT DETECTED Final   Proteus species NOT DETECTED NOT DETECTED Final   Salmonella species NOT DETECTED NOT DETECTED Final   Serratia marcescens NOT DETECTED NOT DETECTED Final   Haemophilus influenzae NOT DETECTED NOT DETECTED Final   Neisseria meningitidis NOT DETECTED NOT DETECTED Final   Pseudomonas aeruginosa NOT DETECTED NOT DETECTED Final   Stenotrophomonas maltophilia NOT DETECTED NOT DETECTED Final   Candida albicans NOT DETECTED NOT DETECTED Final   Candida auris NOT DETECTED NOT DETECTED Final   Candida glabrata NOT DETECTED NOT DETECTED Final   Candida krusei NOT DETECTED NOT DETECTED Final   Candida parapsilosis NOT DETECTED NOT DETECTED Final   Candida tropicalis NOT DETECTED NOT DETECTED Final   Cryptococcus neoformans/gattii NOT DETECTED NOT DETECTED Final   Meth resistant mecA/C and MREJ DETECTED (A) NOT DETECTED Final    Comment: CRITICAL RESULT CALLED TO, READ BACK BY AND VERIFIED WITH: Neta Mends [id number] @ 2129 FH Performed at [facility], [address] [place], [place] [address]   Culture, blood (Routine X 2) w Reflex to ID Panel     Status: None (Preliminary result)   Collection Time: 05/02/23 11:09 PM   Specimen: BLOOD  Result Value Ref Range Status   Specimen Description BLOOD BLOOD RIGHT HAND  Final   Special Requests  Final    BOTTLES DRAWN AEROBIC AND ANAEROBIC Blood Culture results may not be optimal due to an inadequate volume of blood received in culture bottles   Culture   Final    NO GROWTH 4 DAYS Performed at [facility], [address], [place], [place] [address]    Report Status PENDING  Incomplete  Culture, blood (Routine X 2) w Reflex to ID Panel     Status: None (Preliminary result)   Collection Time: 05/02/23 11:09 PM   Specimen: BLOOD  Result Value Ref Range Status   Specimen Description BLOOD BLOOD RIGHT HAND  Final   Special Requests   Final    BOTTLES DRAWN AEROBIC AND ANAEROBIC Blood Culture adequate volume   Culture   Final    NO GROWTH 4 DAYS Performed at [facility], [address], [place], [place] [address]    Report Status PENDING  Incomplete  Aerobic/Anaerobic Culture w Gram Stain (surgical/deep wound)     Status: None (Preliminary result)   Collection Time: 05/03/23  4:10 PM   Specimen: PATH Cytology Misc. fluid; Body Fluid  Result Value Ref Range Status   Specimen Description WOUND  Final   Special Requests LEFT INDEX FINGER  Final   Gram Stain   Final    NO WBC SEEN NO ORGANISMS SEEN Performed at [facility], [address], [place], [place] [address]    Culture   Final    RARE METHICILLIN RESISTANT STAPHYLOCOCCUS AUREUS NO ANAEROBES ISOLATED; CULTURE IN PROGRESS FOR 5 DAYS    Report Status PENDING  Incomplete   Organism ID, Bacteria METHICILLIN RESISTANT STAPHYLOCOCCUS AUREUS  Final      Susceptibility   Methicillin resistant staphylococcus aureus - MIC*    CIPROFLOXACIN >=8 RESISTANT Resistant     ERYTHROMYCIN >=8 RESISTANT Resistant     GENTAMICIN <=0.5  SENSITIVE Sensitive     OXACILLIN >=4 RESISTANT Resistant     TETRACYCLINE <=1 SENSITIVE Sensitive     VANCOMYCIN <=0.5 SENSITIVE Sensitive     TRIMETH/SULFA >=320 RESISTANT Resistant     CLINDAMYCIN <=0.25 SENSITIVE Sensitive     RIFAMPIN <=0.5 SENSITIVE Sensitive     Inducible Clindamycin NEGATIVE Sensitive     LINEZOLID 2 SENSITIVE Sensitive     * RARE METHICILLIN RESISTANT STAPHYLOCOCCUS AUREUS         Radiology Studies: DG CHEST PORT 1 VIEW  Result Date: 05/06/2023 CLINICAL DATA:  Dyspnea. EXAM: PORTABLE CHEST 1 VIEW COMPARISON:  03/13/2022 FINDINGS: Chronic elevation of left hemidiaphragm. Adjacent left lung base scarring. Normal heart size with stable mediastinal contours. No focal airspace disease, pleural effusion, or pneumothorax. Chronic bilateral shoulder arthropathy. IMPRESSION: Chronic elevation of left hemidiaphragm with adjacent left lung base scarring. Electronically Signed   By: Narda Rutherford M.D.   On: 05/06/2023 14:52        Scheduled Meds:  furosemide  40 mg Oral Daily   guaiFENesin  600 mg Oral BID   insulin aspart  0-15 Units Subcutaneous TID WC   insulin aspart  0-5 Units Subcutaneous QHS   isosorbide mononitrate  60 mg Oral Daily   sodium chloride  2 spray Each Nare BID   tamsulosin  0.4 mg Oral Daily   Continuous Infusions:  sodium chloride 20 mL/hr at 05/07/23 0528   DAPTOmycin (CUBICIN) 650 mg in sodium chloride 0.9 % IVPB 650 mg (05/06/23 1315)     LOS: 6 days    Time spent: 45 minutes spent on  chart review, discussion with nursing staff, consultants, updating family and interview/physical exam; more than 50% of that time was spent in counseling and/or coordination of care.    Joseph Art, DO [facility] Available via Epic secure chat 7am-7pm After these hours, please refer to coverage provider listed on [email or web address] 05/07/2023, 12:17 PM

## 2023-05-07 NOTE — Plan of Care (Addendum)
Interpreter, Otho Najjar, set for 8/27 9-10am  Problem: Skin Integrity: Goal: Risk for impaired skin integrity will decrease Outcome: Progressing   Problem: Education: Goal: Knowledge of General Education information will improve Description: Including pain rating scale, medication(s)/side effects and non-pharmacologic comfort measures Outcome: Progressing   Problem: Activity: Goal: Risk for activity intolerance will decrease Outcome: Progressing   Problem: Pain Managment: Goal: General experience of comfort will improve Outcome: Progressing   Problem: Safety: Goal: Ability to remain free from injury will improve Outcome: Progressing   Problem: Skin Integrity: Goal: Risk for impaired skin integrity will decrease Outcome: Progressing

## 2023-05-08 ENCOUNTER — Other Ambulatory Visit: Payer: Self-pay

## 2023-05-08 ENCOUNTER — Encounter (HOSPITAL_COMMUNITY): Payer: Self-pay | Admitting: Cardiology

## 2023-05-08 DIAGNOSIS — M869 Osteomyelitis, unspecified: Secondary | ICD-10-CM | POA: Diagnosis not present

## 2023-05-08 LAB — GLUCOSE, CAPILLARY
Glucose-Capillary: 238 mg/dL — ABNORMAL HIGH (ref 70–99)
Glucose-Capillary: 177 mg/dL — ABNORMAL HIGH (ref 70–99)
Glucose-Capillary: 328 mg/dL — ABNORMAL HIGH (ref 70–99)

## 2023-05-08 LAB — BASIC METABOLIC PANEL
Anion gap: 10 (ref 5–15)
BUN: 41 mg/dL — ABNORMAL HIGH (ref 8–23)
CO2: 24 mmol/L (ref 22–32)
Calcium: 8 mg/dL — ABNORMAL LOW (ref 8.9–10.3)
Chloride: 98 mmol/L (ref 98–111)
Creatinine, Ser: 1.58 mg/dL — ABNORMAL HIGH (ref 0.61–1.24)
GFR, Estimated: 44 mL/min — ABNORMAL LOW (ref >60)
Glucose, Bld: 290 mg/dL — ABNORMAL HIGH (ref 70–99)
Potassium: 4 mmol/L (ref 3.5–5.1)
Sodium: 132 mmol/L — ABNORMAL LOW (ref 135–145)

## 2023-05-08 LAB — ACID FAST SMEAR (AFB, MYCOBACTERIA): Acid Fast Smear: NEGATIVE

## 2023-05-08 LAB — CBC
HCT: 37 % — ABNORMAL LOW (ref 39.0–52.0)
Hemoglobin: 12.1 g/dL — ABNORMAL LOW (ref 13.0–17.0)
MCH: 29.3 pg (ref 26.0–34.0)
MCHC: 32.7 g/dL (ref 30.0–36.0)
MCV: 89.6 fL (ref 80.0–100.0)
Platelets: 317 10*3/uL (ref 150–400)
RBC: 4.13 MIL/uL — ABNORMAL LOW (ref 4.22–5.81)
RDW: 13.3 % (ref 11.5–15.5)
WBC: 10.9 10*3/uL — ABNORMAL HIGH (ref 4.0–10.5)
nRBC: 0 % (ref 0.0–0.2)

## 2023-05-08 LAB — MRSA NEXT GEN BY PCR, NASAL: MRSA by PCR Next Gen: DETECTED — AB

## 2023-05-08 LAB — AEROBIC/ANAEROBIC CULTURE W GRAM STAIN (SURGICAL/DEEP WOUND): Gram Stain: NONE SEEN

## 2023-05-08 MED ORDER — INSULIN ASPART 100 UNIT/ML IJ SOLN
0.0000 [IU] | Freq: Every day | INTRAMUSCULAR | Status: DC
  Administered 2023-05-08: 4 [IU] via SUBCUTANEOUS
  Administered 2023-05-09: 2 [IU] via SUBCUTANEOUS

## 2023-05-08 MED ORDER — SODIUM CHLORIDE 0.9% FLUSH: 10.0000 mL | INTRAVENOUS | Status: DC | PRN

## 2023-05-08 MED ORDER — INSULIN ASPART 100 UNIT/ML IJ SOLN
0.0000 [IU] | Freq: Three times a day (TID) | INTRAMUSCULAR | Status: DC
  Administered 2023-05-08 (×2): 4 [IU] via SUBCUTANEOUS
  Administered 2023-05-09: 7 [IU] via SUBCUTANEOUS
  Administered 2023-05-09: 4 [IU] via SUBCUTANEOUS
  Administered 2023-05-09 – 2023-05-10 (×2): 3 [IU] via SUBCUTANEOUS

## 2023-05-08 MED ORDER — CHLORHEXIDINE GLUCONATE CLOTH 2 % EX PADS
6.0000 | MEDICATED_PAD | Freq: Every day | CUTANEOUS | Status: DC
  Administered 2023-05-09 – 2023-05-10 (×2): 6 via TOPICAL

## 2023-05-08 MED ORDER — SODIUM CHLORIDE 0.9% FLUSH
10.0000 mL | Freq: Two times a day (BID) | INTRAVENOUS | Status: DC
  Administered 2023-05-08 – 2023-05-09 (×3): 10 mL

## 2023-05-08 MED ORDER — TRAZODONE HCL 50 MG PO TABS
25.0000 mg | ORAL_TABLET | Freq: Every evening | ORAL | Status: DC | PRN
  Administered 2023-05-08 – 2023-05-10 (×2): 25 mg via ORAL
  Filled 2023-05-08 (×2): qty 1

## 2023-05-08 MED ORDER — MUPIROCIN 2 % EX OINT
1.0000 | TOPICAL_OINTMENT | Freq: Two times a day (BID) | CUTANEOUS | Status: DC
  Administered 2023-05-08 – 2023-05-10 (×4): 1 via NASAL
  Filled 2023-05-08 (×2): qty 22

## 2023-05-08 NOTE — Progress Notes (Addendum)
   05/08/23 1457  Mobility  Activity Ambulated with assistance in hallway  Level of Assistance Minimal assist, patient does 75% or more (+2) CG.  Location manager Ambulated (ft) 50 ft  LUE Weight Bearing Weight bear through elbow only  Activity Response Tolerated fair  Mobility Referral Yes  $Mobility charge 1 Mobility  Mobility Specialist Start Time (ACUTE ONLY) 1427  Mobility Specialist Stop Time (ACUTE ONLY) 1447  Mobility Specialist Time Calculation (min) (ACUTE ONLY) 20 min   Mobility Specialist: Progress Note  Pt agreeable to mobility session - received in chair. Required MinA +2 STS, ambulated CG with a wobbly gait, c/o neck pain. Pt returned to bed with all needs met - call bell within reach. NT in room.   Barnie Mort, BS Mobility Specialist Please contact via SecureChat or Rehab office at [phone].

## 2023-05-08 NOTE — Plan of Care (Signed)
  Problem: Fluid Volume: Goal: Ability to maintain a balanced intake and output will improve Outcome: Progressing   Problem: Health Behavior/Discharge Planning: Goal: Ability to identify and utilize available resources and services will improve Outcome: Progressing   Problem: Nutritional: Goal: Maintenance of adequate nutrition will improve Outcome: Progressing

## 2023-05-08 NOTE — Progress Notes (Signed)
PICC order noted, patient has had anesthesia today and cannot legally give consent for 24 hours.  Contacted daughter Claybon Jabs who states that she is the POA.  Explained PICC, risk, benefit, and use.  Crystal states that she would like to wait until her father is able to give his own consent for this procedure.  Dr. Benjamine Mola and Lelon Mast, RN updated.  PICC team will follow up tomorrow.

## 2023-05-08 NOTE — Anesthesia Postprocedure Evaluation (Signed)
Anesthesia Post Note  Patient: Christian [name]  Procedure(s) Performed: TRANSESOPHAGEAL ECHOCARDIOGRAM     Patient location during evaluation: Cath Lab Anesthesia Type: MAC Level of consciousness: sedated and patient cooperative Pain management: pain level controlled Vital Signs Assessment: post-procedure vital signs reviewed and stable Respiratory status: spontaneous breathing Cardiovascular status: stable Anesthetic complications: no   No notable events documented.  Last Vitals:  Vitals:   05/08/23 0400 05/08/23 0733  BP: 116/66 127/68  Pulse: 93 78  Resp: 19 19  Temp: 36.7 C 36.6 C  SpO2: 97% (!) 89%    Last Pain:  Vitals:   05/08/23 0733  TempSrc:   PainSc: 0-No pain                 Lewie Loron

## 2023-05-08 NOTE — TOC Initial Note (Signed)
Transition of Care [facility]) - Initial/Assessment Note    Patient Details  Name: Christian [name] MRN: [id number] Date of Birth: 11/01/1940  Transition of [facility]) CM/SW Contact:    Lorri Frederick, LCSW Phone Number: 05/08/2023, 2:04 PM  Clinical Narrative:     CSW met with pt with assistance from [place] sign language interpretor.  Pt from home, grandson lives with him.  Pt does have HH RN, unclear which agency.  Pt is agreeable to SNF, medicare choice document provided from [place] and [place] locations.  Permission given to speak with all listed children.  Will be IV abx primary but also requires PT/OT.  Referral sent out in hub for SNF.  CSW LM with daughter  Joni Reining.              Expected Discharge Plan: Skilled Nursing Facility Barriers to Discharge: Continued Medical Work up, SNF Pending bed offer   Patient Goals and CMS Choice Patient states their goals for this hospitalization and ongoing recovery are:: walk CMS Medicare.gov Compare Post Acute Care list provided to:: Patient Choice offered to / list presented to : Patient      Expected Discharge Plan and Services In-house Referral: Clinical Social Work   Post Acute Care Choice: Skilled Nursing Facility Living arrangements for the past 2 months: Single Family Home                                      Prior Living Arrangements/Services Living arrangements for the past 2 months: Single Family Home Lives with:: Relatives (lives with grandson) Patient language and need for interpreter reviewed:: Yes Do you feel safe going back to the place where you live?: Yes      Need for Family Participation in Patient Care: Yes (Comment) Care giver support system in place?: Yes (comment) Current home services: Home RN (unsure what agency) Criminal Activity/Legal Involvement Pertinent to Current Situation/Hospitalization: No - Comment as needed  Activities of Daily Living      Permission  Sought/Granted Permission sought to share information with : Family Supports Permission granted to share information with : Yes, Verbal Permission Granted  Share Information with NAME: all listed children           Emotional Assessment Appearance:: Appears stated age Attitude/Demeanor/Rapport: Engaged Affect (typically observed): Appropriate, Pleasant Orientation: : Oriented to Self, Oriented to Place, Oriented to  Time, Oriented to Situation      Admission diagnosis:  Osteomyelitis (HCC) [M86.9] Patient Active Problem List   Diagnosis Date Noted   Osteomyelitis (HCC) 05/01/2023   Metabolic acidosis 05/01/2023   Hyponatremia 05/01/2023   Chronic heart failure with preserved ejection fraction (HFpEF) (HCC) 05/01/2023   Decreased functional mobility and endurance 06/15/2021   Pressure injury of skin 03/15/2021   Wound cellulitis 03/12/2021   Acute-on-chronic kidney injury (HCC) 03/12/2021   Fall at home, initial encounter 09/14/2020   Chronic kidney disease, stage III (moderate) (HCC) 09/11/2020   Gastro-esophageal reflux disease with esophagitis 03/16/2020   Intractable vomiting with nausea 03/13/2020   Decreased activities of daily living (ADL) 02/26/2020   Dysphagia 02/26/2020   Impaired functional mobility, balance, gait, and endurance 02/26/2020   Acute respiratory failure with hypoxia (HCC) 10/27/2019   Sepsis (HCC) 10/21/2019   Generalized anxiety disorder 09/01/2019   Primary insomnia 09/01/2019   GERD without esophagitis 07/23/2019   Meningioma (HCC) 07/23/2019   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   Ulcer of left foot, limited to breakdown of skin (HCC) 02/07/2018   Chronic pain of both shoulders 08/09/2017   Pain in right wrist 08/09/2017   Atherosclerosis of renal artery (HCC) 04/24/2017   Type 2 diabetes mellitus (HCC) 04/24/2017   Chronic left-sided low back pain with left-sided sciatica 07/24/2016   Lower  extremity edema 01/28/2015   Angina pectoris (HCC) 07/08/2014   Normocytic anemia 05/06/2014   Acute gastric ulcer 08/27/2013   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   IDA (iron deficiency anemia) 08/25/2013   Mixed hyperlipidemia 08/13/2013   Essential hypertension, benign 08/13/2013   PCP:  Montez Hageman, DO Pharmacy:   [facility] [phone] - [place], [place] - [address] [place] [address] Phone: [phone] Fax: [phone]  [facility] Delivery) - [place], [place] - [address] [facility] [address] [place] [address] [place] [address] Phone: [phone] Fax: [phone]  [facility] Delivery - [place], [place] - [address] [place] [address] Phone: [phone] Fax: [phone]     Social Determinants of Health (SDOH) Social History: SDOH Screenings   Tobacco Use: Low Risk  (05/07/2023)   SDOH Interventions:     Readmission Risk Interventions     No data to display

## 2023-05-08 NOTE — Progress Notes (Signed)
PROGRESS NOTE    Christian [name]  ZOX:[id number] DOB: 09-Jun-1941 DOA: 05/01/2023 PCP: Montez Hageman, DO    Brief Narrative:  Christian [name] is a 82 y.o. male with hearing difficulties and needs sign language interpreter.  He has medical history significant of hypertension, hyperlipidemia, type 2 diabetes with peripheral neuropathy, Charcot-Marie-Tooth disease, CAD, PAD, chronic HFpEF, CKD stage IIIa, history of transmetatarsal amputation of left foot, gastric ulcers, iron deficiency anemia, GERD presenting with left index finger swelling and erythema.  Found to have staph bacteremia. TEE Negative.  Plan for IV Abx at SNF when bed available.   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   Mild hyponatremia normalized   Fall at home Patient denies head injury or loss of consciousness.  No other injuries reported.    Type 2 diabetes -SSI- increased SSI as blood sugars high -change to carb mod diet   Chronic HFpEF Echo done in September 2023 showing EF 60 to 65%, grade 2 diastolic dysfunction.  No signs of volume overload at this time.  -resume lasix PO -check x ray: chronic lung scarring   Chronic normocytic anemia Hemoglobin 11.8, baseline 11-13.  No signs of bleeding.  Continue to monitor labs.  No indication for blood transfusion at this time.  Informed by the patient's daughter that patient is okay with receiving blood transfusions during this hospitalization if needed.   Hypertension: Blood pressure stable. Hyperlipidemia CAD: Patient is not endorsing any anginal symptoms. PVD GERD/PUD   DVT  prophylaxis: SCDs Start: 05/01/23 2229    Code Status: Full Code   Disposition Plan:  Level of care: Telemetry Medical Status is: Inpatient Remains inpatient appropriate -- for IV abx will need to go to SNF   Consultants:  ID Hand surgery Cards (TEE)   Subjective: Had trouble sleeping last night  Objective: Vitals:   05/07/23 1500 05/07/23 2100 05/08/23 0400 05/08/23 0733  BP: 118/76 131/71 116/66 127/68  Pulse: 81 100 93 78  Resp: 20 18 19 19   Temp:  98.4 F (36.9 C) 98 F (36.7 C) 97.9 F (36.6 C)  TempSrc:  Oral Oral   SpO2: 93% 95% 97% (!) 89%  Weight:      Height:        Intake/Output Summary (Last 24 hours) at 05/08/2023 1104 Last data filed at 05/08/2023 0800 Gross per 24 hour  Intake 783.48 ml  Output 2000 ml  Net -1216.52 ml   Filed Weights   05/01/23 2300 05/07/23 1334  Weight: 82.1 kg 81.6 kg    Examination:    General: Appearance:     Overweight male in no acute distress     Lungs:     Not on O2, diminished, respirations unlabored  Heart:    Normal heart rate. .   MS:   Left index finger amputation noted.   Neurologic:   Awake, alert, oriented x 3. No apparent focal neurological           defect.        Data Reviewed: I have personally reviewed following labs and imaging studies  CBC: Recent Labs  Lab 05/01/23 1429 05/02/23 0329 05/03/23  1191 05/04/23 0439 05/05/23 0400 05/07/23 0506 05/08/23 0307  WBC 13.2*   < > 11.6* 9.2 9.2 8.7 10.9*  NEUTROABS 10.4*  --   --   --   --   --   --   HGB 11.8*   < > 12.2* 12.2* 11.0* 11.8* 12.1*  HCT 35.9*   < > 36.6* 37.5* 34.2* 36.5* 37.0*  MCV 88.0   < > 89.7 89.7 89.1 88.2 89.6  PLT 214   < > 223 258 249 304 317   < > = values in this interval not displayed.   Basic Metabolic Panel: Recent Labs  Lab 05/03/23 0955 05/04/23 0812 05/05/23 0400 05/07/23 0506 05/08/23 0307  NA 135 136 135 136 132*  K 3.8 4.3 4.1 3.8 4.0  CL 104 103 102 100 98  CO2 21* 21* 20* 28 24  GLUCOSE 147*  160* 146* 158* 290*  BUN 29* 33* 33* 42* 41*  CREATININE 1.70* 1.73* 1.77* 1.62* 1.58*  CALCIUM 8.1* 8.2* 8.0* 8.3* 8.0*  MG  --  1.8  --   --   --    GFR: Estimated Creatinine Clearance: 39.1 mL/min (A) (by C-G formula based on SCr of 1.58 mg/dL (H)). Liver Function Tests: Recent Labs  Lab 05/01/23 1429  AST 27  ALT 21  ALKPHOS 154*  BILITOT 0.9  PROT 6.6  ALBUMIN 2.9*   No results for input(s): "LIPASE", "AMYLASE" in the last 168 hours. No results for input(s): "AMMONIA" in the last 168 hours. Coagulation Profile: No results for input(s): "INR", "PROTIME" in the last 168 hours. Cardiac Enzymes: Recent Labs  Lab 05/05/23 0400  CKTOTAL 154   BNP (last 3 results) No results for input(s): "PROBNP" in the last 8760 hours. HbA1C: No results for input(s): "HGBA1C" in the last 72 hours.  CBG: Recent Labs  Lab 05/07/23 1127 05/07/23 1630 05/07/23 2032 05/07/23 2110 05/08/23 0730  GLUCAP 125* 101* 306* 380* 238*   Lipid Profile: No results for input(s): "CHOL", "HDL", "LDLCALC", "TRIG", "CHOLHDL", "LDLDIRECT" in the last 72 hours. Thyroid Function Tests: No results for input(s): "TSH", "T4TOTAL", "FREET4", "T3FREE", "THYROIDAB" in the last 72 hours. Anemia Panel: No results for input(s): "VITAMINB12", "FOLATE", "FERRITIN", "TIBC", "IRON", "RETICCTPCT" in the last 72 hours. Sepsis Labs: Recent Labs  Lab 05/01/23 1440  LATICACIDVEN 1.1    Recent Results (from the past 240 hour(s))  Blood culture (routine x 2)     Status: None   Collection Time: 05/01/23  8:23 PM   Specimen: BLOOD RIGHT ARM  Result Value Ref Range Status   Specimen Description BLOOD RIGHT ARM  Final   Special Requests   Final    BOTTLES DRAWN AEROBIC AND ANAEROBIC Blood Culture results may not be optimal due to an excessive volume of blood received in culture bottles   Culture   Final    NO GROWTH 5 DAYS Performed at [facility], [address] [place], [place] [address]    Report  Status 05/06/2023 FINAL  Final  Blood culture (routine x 2)     Status: Abnormal (Preliminary result)   Collection Time: 05/01/23  8:28 PM   Specimen: BLOOD LEFT ARM  Result Value Ref Range Status   Specimen Description BLOOD LEFT ARM  Final   Special Requests   Final    BOTTLES DRAWN AEROBIC AND ANAEROBIC Blood Culture results may not be optimal due to an excessive volume of blood received in culture bottles   Culture  Setup  Time   Final    GRAM POSITIVE COCCI IN CLUSTERS IN BOTH AEROBIC AND ANAEROBIC BOTTLES CRITICAL RESULT CALLED TO, READ BACK BY AND VERIFIED WITH: Neta Mends [id number] @ 2129 FH    Culture (A)  Final    METHICILLIN RESISTANT STAPHYLOCOCCUS AUREUS Sent to [facility] for further susceptibility testing. Performed at [facility], [address], [place], [place] [address]    Report Status PENDING  Incomplete   Organism ID, Bacteria METHICILLIN RESISTANT STAPHYLOCOCCUS AUREUS  Final      Susceptibility   Methicillin resistant staphylococcus aureus - MIC*    CIPROFLOXACIN >=8 RESISTANT Resistant     ERYTHROMYCIN >=8 RESISTANT Resistant     GENTAMICIN <=0.5 SENSITIVE Sensitive     OXACILLIN >=4 RESISTANT Resistant     TETRACYCLINE <=1 SENSITIVE Sensitive     VANCOMYCIN <=0.5 SENSITIVE Sensitive     TRIMETH/SULFA >=320 RESISTANT Resistant     CLINDAMYCIN <=0.25 SENSITIVE Sensitive     RIFAMPIN <=0.5 SENSITIVE Sensitive     Inducible Clindamycin NEGATIVE Sensitive     LINEZOLID 2 SENSITIVE Sensitive     * METHICILLIN RESISTANT STAPHYLOCOCCUS AUREUS  Blood Culture ID Panel (Reflexed)     Status: Abnormal   Collection Time: 05/01/23  8:28 PM  Result Value Ref Range Status   Enterococcus faecalis NOT DETECTED NOT DETECTED Final   Enterococcus Faecium NOT DETECTED NOT DETECTED Final   Listeria monocytogenes NOT DETECTED NOT DETECTED Final   Staphylococcus species DETECTED (A) NOT DETECTED Final    Comment: CRITICAL RESULT CALLED TO, READ BACK BY AND VERIFIED  WITH: PHARMD Talmadge Chad [id number] @ 2129 FH    Staphylococcus aureus (BCID) DETECTED (A) NOT DETECTED Final    Comment: Methicillin (oxacillin)-resistant Staphylococcus aureus (MRSA). MRSA is predictably resistant to beta-lactam antibiotics (except ceftaroline). Preferred therapy is vancomycin unless clinically contraindicated. Patient requires contact precautions if  hospitalized. CRITICAL RESULT CALLED TO, READ BACK BY AND VERIFIED WITH: Neta Mends [id number] @ 2129 FH    Staphylococcus epidermidis NOT DETECTED NOT DETECTED Final   Staphylococcus lugdunensis NOT DETECTED NOT DETECTED Final   Streptococcus species NOT DETECTED NOT DETECTED Final   Streptococcus agalactiae NOT DETECTED NOT DETECTED Final   Streptococcus pneumoniae NOT DETECTED NOT DETECTED Final   Streptococcus pyogenes NOT DETECTED NOT DETECTED Final   A.calcoaceticus-baumannii NOT DETECTED NOT DETECTED Final   Bacteroides fragilis NOT DETECTED NOT DETECTED Final   Enterobacterales NOT DETECTED NOT DETECTED Final   Enterobacter cloacae complex NOT DETECTED NOT DETECTED Final   Escherichia coli NOT DETECTED NOT DETECTED Final   Klebsiella aerogenes NOT DETECTED NOT DETECTED Final   Klebsiella oxytoca NOT DETECTED NOT DETECTED Final   Klebsiella pneumoniae NOT DETECTED NOT DETECTED Final   Proteus species NOT DETECTED NOT DETECTED Final   Salmonella species NOT DETECTED NOT DETECTED Final   Serratia marcescens NOT DETECTED NOT DETECTED Final   Haemophilus influenzae NOT DETECTED NOT DETECTED Final   Neisseria meningitidis NOT DETECTED NOT DETECTED Final   Pseudomonas aeruginosa NOT DETECTED NOT DETECTED Final   Stenotrophomonas maltophilia NOT DETECTED NOT DETECTED Final   Candida albicans NOT DETECTED NOT DETECTED Final   Candida auris NOT DETECTED NOT DETECTED Final   Candida glabrata NOT DETECTED NOT DETECTED Final   Candida krusei NOT DETECTED NOT DETECTED Final   Candida parapsilosis NOT DETECTED NOT  DETECTED Final   Candida tropicalis NOT DETECTED NOT DETECTED Final   Cryptococcus neoformans/gattii NOT DETECTED NOT DETECTED Final   Meth resistant mecA/C  and MREJ DETECTED (A) NOT DETECTED Final    Comment: CRITICAL RESULT CALLED TO, READ BACK BY AND VERIFIED WITH: Neta Mends [id number] @ 2129 FH Performed at [facility], [address], [place], [place] [address]   Culture, blood (Routine X 2) w Reflex to ID Panel     Status: None   Collection Time: 05/02/23 11:09 PM   Specimen: BLOOD  Result Value Ref Range Status   Specimen Description BLOOD BLOOD RIGHT HAND  Final   Special Requests   Final    BOTTLES DRAWN AEROBIC AND ANAEROBIC Blood Culture results may not be optimal due to an inadequate volume of blood received in culture bottles   Culture   Final    NO GROWTH 5 DAYS Performed at [facility], [address], [place], [place] [address]    Report Status 05/07/2023 FINAL  Final  Culture, blood (Routine X 2) w Reflex to ID Panel     Status: None   Collection Time: 05/02/23 11:09 PM   Specimen: BLOOD  Result Value Ref Range Status   Specimen Description BLOOD BLOOD RIGHT HAND  Final   Special Requests   Final    BOTTLES DRAWN AEROBIC AND ANAEROBIC Blood Culture adequate volume   Culture   Final    NO GROWTH 5 DAYS Performed at [facility], [address], [place], [place] [address]    Report Status 05/07/2023 FINAL  Final  Aerobic/Anaerobic Culture w Gram Stain (surgical/deep wound)     Status: None (Preliminary result)   Collection Time: 05/03/23  4:10 PM   Specimen: PATH Cytology Misc. fluid; Body Fluid  Result Value Ref Range Status   Specimen Description WOUND  Final   Special Requests LEFT INDEX FINGER  Final   Gram Stain   Final    NO WBC SEEN NO ORGANISMS SEEN Performed at [facility], [address], [place], [place] [address]    Culture   Final    RARE METHICILLIN RESISTANT STAPHYLOCOCCUS AUREUS NO ANAEROBES ISOLATED;  CULTURE IN PROGRESS FOR 5 DAYS    Report Status PENDING  Incomplete   Organism ID, Bacteria METHICILLIN RESISTANT STAPHYLOCOCCUS AUREUS  Final      Susceptibility   Methicillin resistant staphylococcus aureus - MIC*    CIPROFLOXACIN >=8 RESISTANT Resistant     ERYTHROMYCIN >=8 RESISTANT Resistant     GENTAMICIN <=0.5 SENSITIVE Sensitive     OXACILLIN >=4 RESISTANT Resistant     TETRACYCLINE <=1 SENSITIVE Sensitive     VANCOMYCIN <=0.5 SENSITIVE Sensitive     TRIMETH/SULFA >=320 RESISTANT Resistant     CLINDAMYCIN <=0.25 SENSITIVE Sensitive     RIFAMPIN <=0.5 SENSITIVE Sensitive     Inducible Clindamycin NEGATIVE Sensitive     LINEZOLID 2 SENSITIVE Sensitive     * RARE METHICILLIN RESISTANT STAPHYLOCOCCUS AUREUS         Radiology Studies: [place] EKG SITE RITE  Result Date: 05/08/2023 If Site Rite image not attached, placement could not be confirmed due to current cardiac rhythm.  ECHO TEE  Result Date: 05/07/2023    TRANSESOPHOGEAL ECHO REPORT   Patient Name:   Christian [name] Date of Exam: 05/07/2023 Medical Rec #:  [id number]        Height:       68.0 in Accession #:    [id number]       Weight:       181.0 lb Date of Birth:  July 29, 1941  BSA:          1.959 m Patient Age:    81 years         BP:           111/72 mmHg Patient Gender: M                HR:           80 bpm. Exam Location:  Inpatient Procedure: Transesophageal Echo, Cardiac Doppler, Color Doppler and 3D Echo Indications:    Bacteremia  History:        Patient has prior history of Echocardiogram examinations, most                 recent 05/03/2023. Risk Factors:Hypertension, Diabetes and                 Dyslipidemia.  Sonographer:    Milda Smart Referring Phys: [id number] [place] PROCEDURE: After discussion of the risks and benefits of a TEE, an informed consent was obtained from the patient. TEE procedure time was 9 minutes. The transesophogeal probe was passed without difficulty through the esophogus of the  patient. Imaged were  obtained with the patient in a left lateral decubitus position. Sedation performed by different physician. The patient was monitored while under deep sedation. Anesthestetic sedation was provided intravenously by Anesthesiology: 186.88mg  of Propofol. Image quality was good. The patient's vital signs; including heart rate, blood pressure, and oxygen saturation; remained stable throughout the procedure. The patient developed no complications during the procedure.  IMPRESSIONS  1. Left ventricular ejection fraction, by estimation, is 55 to 60%. The left ventricle has normal function. The left ventricle has no regional wall motion abnormalities.  2. Right ventricular systolic function is normal. The right ventricular size is normal.  3. No left atrial/left atrial appendage thrombus was detected.  4. The mitral valve is grossly normal. Mild mitral valve regurgitation. No evidence of mitral stenosis.  5. The aortic valve is tricuspid. Aortic valve regurgitation is not visualized. No aortic stenosis is present.  6. There is mild (Grade II) plaque.  7. The inferior vena cava is normal in size with greater than 50% respiratory variability, suggesting right atrial pressure of 3 mmHg. Conclusion(s)/Recommendation(s): No evidence of vegetation/infective endocarditis on this transesophageael echocardiogram. FINDINGS  Left Ventricle: Left ventricular ejection fraction, by estimation, is 55 to 60%. The left ventricle has normal function. The left ventricle has no regional wall motion abnormalities. The left ventricular internal cavity size was normal in size. There is  no left ventricular hypertrophy. Right Ventricle: The right ventricular size is normal. No increase in right ventricular wall thickness. Right ventricular systolic function is normal. Left Atrium: Left atrial size was normal in size. No left atrial/left atrial appendage thrombus was detected. Right Atrium: Right atrial size was normal in size.  Pericardium: There is no evidence of pericardial effusion. Mitral Valve: The mitral valve is grossly normal. There is mild calcification of the mitral valve leaflet(s). Mild mitral valve regurgitation. No evidence of mitral valve stenosis. Tricuspid Valve: The tricuspid valve is normal in structure. Tricuspid valve regurgitation is trivial. No evidence of tricuspid stenosis. Aortic Valve: The aortic valve is tricuspid. Aortic valve regurgitation is not visualized. No aortic stenosis is present. Pulmonic Valve: The pulmonic valve was normal in structure. Pulmonic valve regurgitation is not visualized. No evidence of pulmonic stenosis. Aorta: The aortic root is normal in size and structure. There is mild (Grade II) plaque. Venous: The inferior vena cava is normal  in size with greater than 50% respiratory variability, suggesting right atrial pressure of 3 mmHg. IAS/Shunts: No atrial level shunt detected by color flow Doppler. Additional Comments: Spectral Doppler performed. Donato Schultz MD Electronically signed by Donato Schultz MD Signature Date/Time: 05/07/2023/4:23:19 PM    Final    EP STUDY  Result Date: 05/07/2023 See surgical note for result.       Scheduled Meds:  furosemide  40 mg Oral Daily   guaiFENesin  600 mg Oral BID   insulin aspart  0-15 Units Subcutaneous TID WC   insulin aspart  0-5 Units Subcutaneous QHS   isosorbide mononitrate  60 mg Oral Daily   sodium chloride  2 spray Each Nare BID   tamsulosin  0.4 mg Oral Daily   Continuous Infusions:  DAPTOmycin (CUBICIN) 650 mg in sodium chloride 0.9 % IVPB 650 mg (05/06/23 1315)     LOS: 7 days    Time spent: 45 minutes spent on chart review, discussion with nursing staff, consultants, updating family and interview/physical exam; more than 50% of that time was spent in counseling and/or coordination of care.    Joseph Art, DO [facility] Available via Epic secure chat 7am-7pm After these hours, please refer to coverage  provider listed on [email or web address] 05/08/2023, 11:04 AM

## 2023-05-08 NOTE — Progress Notes (Signed)
Peripherally Inserted Central Catheter Placement  The IV Nurse has discussed with the patient and/or persons authorized to consent for the patient, the purpose of this procedure and the potential benefits and risks involved with this procedure.  The benefits include less needle sticks, lab draws from the catheter, and the patient may be discharged home with the catheter. Risks include, but not limited to, infection, bleeding, blood clot (thrombus formation), and puncture of an artery; nerve damage and irregular heartbeat and possibility to perform a PICC exchange if needed/ordered by physician.  Alternatives to this procedure were also discussed.  Bard Power PICC patient education guide, fact sheet on infection prevention and patient information card has been provided to patient /or left at bedside.    PICC Placement Documentation  PICC Single Lumen 05/08/23 Right Basilic 39 cm 0 cm (Active)  Indication for Insertion or Continuance of Line Prolonged intravenous therapies 05/08/23 1840  Exposed Catheter (cm) 0 cm 05/08/23 1840  Site Assessment Clean, Dry, Intact 05/08/23 1840  Line Status Flushed;Saline locked;Blood return noted 05/08/23 1840  Dressing Type Transparent;Securing device 05/08/23 1840  Dressing Status Antimicrobial disc in place;Clean, Dry, Intact 05/08/23 1840  Line Care Connections checked and tightened 05/08/23 1840  Line Adjustment (NICU/IV Team Only) No 05/08/23 1840  Dressing Intervention New dressing;Adhesive placed at insertion site (IV team only);Other (Comment) 05/08/23 1840  Dressing Change Due 05/15/23 05/08/23 1840    Patient signed PICC consent with [facility]/ASL Cailin #[id number]. Verified by 2 PICC RNs.   Annett Fabian 05/08/2023, 6:42 PM

## 2023-05-08 NOTE — Progress Notes (Addendum)
PHARMACY CONSULT NOTE FOR:  OUTPATIENT  PARENTERAL ANTIBIOTIC THERAPY (OPAT)  Informational OPAT orders only, planned discharge to SNF  Indication: osteo of left hand Regimen: daptomycin 650 mg IV q24h End date: 06/14/23  IV antibiotic discharge orders are pended. To discharging provider:  please sign these orders via discharge navigator,  Select New Orders & click on the button choice - Manage This Unsigned Work.     Thank you for allowing pharmacy to be a part of this patient's care.  Romie Minus, PharmD PGY1 Pharmacy Resident  Please check AMION for all [facility] Pharmacy phone numbers After 10:00 PM, call Main Pharmacy [phone]

## 2023-05-08 NOTE — Inpatient Diabetes Management (Signed)
Inpatient Diabetes Program Recommendations  AACE/ADA: New Consensus Statement on Inpatient Glycemic Control (2015)  Target Ranges:  Prepandial:   less than 140 mg/dL      Peak postprandial:   less than 180 mg/dL (1-2 hours)      Critically ill patients:  140 - 180 mg/dL   Lab Results  Component Value Date   GLUCAP 177 (H) 05/08/2023   HGBA1C 8.7 (H) 05/01/2023    Review of Glycemic Control  Latest Reference Range & Units 05/07/23 08:09 05/07/23 11:27 05/07/23 16:30 05/07/23 20:32 05/07/23 21:10 05/08/23 07:30 05/08/23 11:33  Glucose-Capillary 70 - 99 mg/dL 644 (H) 034 (H) 742 (H) 306 (H) 380 (H) 238 (H) 177 (H)   Diabetes history: DM 2 Outpatient Diabetes medications: none noted Current orders for Inpatient glycemic control:  Novolog 0-20 units tid + hs  A1c 8.7% on 8/20 Renal function elevated  Inpatient Diabetes Program Recommendations:    -   Consider reducing Novolog Correction down to 0-9 units tid + hs -   Add Semglee 5 units Q24 hours  Thanks,  Christena Deem RN, MSN, BC-ADM Inpatient Diabetes Coordinator Team Pager [phone] (8a-5p)

## 2023-05-08 NOTE — Progress Notes (Signed)
Physical Therapy Treatment Patient Details Name: Christian [name] MRN: [id number] DOB: Nov 17, 1940 Today's Date: 05/08/2023   History of Present Illness Pt is a 82 year old male admitted on 05/01/23 with left index finger swelling and erythema. Pt with hearing difficulties and needs sign language interpreter.  OR 8/22- s/p L index finger amputation, possible plan for further debridement; Plan for TEE Monday 8/26; PMH: hypertension, hyperlipidemia, type 2 diabetes with peripheral neuropathy, Charcot-Marie-Tooth disease, CAD, PAD, chronic HFpEF, CKD stage IIIa, history of transmetatarsal amputation of left foot, gastric ulcers, iron deficiency anemia, GERD.    PT Comments  Pt finishing up breakfast when ALS interpreter arrived. Pt request to use bathroom. Pt limited in safe mobility by decreased ability to maintain L UE weight bearing through elbow only without increased multimodal cuing, in presence of generalized weakness and decreased balance. Pt is min A for bed mobility. Requires total A for donning his orthopedic shoes. Pt is min A for transfers and ambulation into bathroom. Pt does require assistance for pericare. D/c plans remain appropriate at this time. PT will continue to follow acutely.   If plan is discharge home, recommend the following: A little help with bathing/dressing/bathroom;Assistance with cooking/housework;Assist for transportation;Help with stairs or ramp for entrance;Direct supervision/assist for medications management;Assistance with feeding;A lot of help with walking and/or transfers   Can travel by private vehicle     Yes  Equipment Recommendations  Other (comment) (L platform RW)       Precautions / Restrictions Precautions Precautions: Fall Precaution Comments: Contact precs; reports 4-5 falls a month Required Braces or Orthoses: Other Brace Other Brace: orthotic shoes in room (hx L TMA) Restrictions Weight Bearing Restrictions: Yes LUE Weight Bearing: Weight bear  through elbow only Other Position/Activity Restrictions: WB through elbow L on PFRW preferred post-op     Mobility  Bed Mobility Overal bed mobility: Needs Assistance Bed Mobility: Supine to Sit     Supine to sit: Min assist, HOB elevated, Used rails     General bed mobility comments: cues for safe UE placement and minA to avoid WB through L wrist.    Transfers Overall transfer level: Needs assistance Equipment used: Left platform walker Transfers: Sit to/from Stand, Bed to chair/wheelchair/BSC Sit to Stand: From elevated surface, Min assist           General transfer comment: with bed elevated and min A for managment of L UE to platform walker pt able to power up and steady in standing.    Ambulation/Gait Ambulation/Gait assistance: Min assist Gait Distance (Feet): 24 Feet Assistive device: Left platform walker Gait Pattern/deviations: Decreased stride length, Step-through pattern, Trunk flexed Gait velocity: decreased Gait velocity interpretation: <1.31 ft/sec, indicative of household ambulator   General Gait Details: min A for steadying with platform walker, cues for upright posture and managment of walker around obstacles       Balance Overall balance assessment: Needs assistance Sitting-balance support: No upper extremity supported Sitting balance-[name] Scale: Fair     Standing balance support: Bilateral upper extremity supported, During functional activity Standing balance-[name] Scale: Poor Standing balance comment: poor unsupported, fair with L PFRW                            Cognition Arousal: Alert Behavior During Therapy: WFL for tasks assessed/performed Overall Cognitive Status: Within Functional Limits for tasks assessed  General Comments: pt needs repetition of some cues throughout for safety/sequencing. live ASL interpreter present.           General Comments General comments (skin  integrity, edema, etc.): utilized in person ASL interpreter, PT donned shoes in seated EoB      Pertinent Vitals/Pain Pain Assessment Pain Assessment: Faces Faces Pain Scale: Hurts a little bit Pain Location: LUE Pain Descriptors / Indicators: Grimacing, Discomfort Pain Intervention(s): Limited activity within patient's tolerance, Monitored during session, Repositioned     PT Goals (current goals can now be found in the care plan section) Acute Rehab PT Goals Patient Stated Goal: to be able to move around by myself Progress towards PT goals: Progressing toward goals    Frequency    Min 1X/week       AM-PAC PT "6 Clicks" Mobility   Outcome Measure  Help needed turning from your back to your side while in a flat bed without using bedrails?: A Little Help needed moving from lying on your back to sitting on the side of a flat bed without using bedrails?: A Lot Help needed moving to and from a bed to a chair (including a wheelchair)?: A Lot Help needed standing up from a chair using your arms (e.g., wheelchair or bedside chair)?: A Lot Help needed to walk in hospital room?: A Little Help needed climbing 3-5 steps with a railing? : A Lot 6 Click Score: 14    End of Session Equipment Utilized During Treatment: Gait belt Activity Tolerance: Patient tolerated treatment well Patient left: in chair;with call bell/phone within reach;Other (comment) (ASL interpreter in room) Nurse Communication: Mobility status PT Visit Diagnosis: Other abnormalities of gait and mobility (R26.89)     Time: 9147-8295 PT Time Calculation (min) (ACUTE ONLY): 33 min  Charges:    $Gait Training: 8-22 mins $Therapeutic Activity: 8-22 mins PT General Charges $$ ACUTE PT VISIT: 1 Visit                     Shauni Henner B. Beverely Risen PT, DPT Acute Rehabilitation Services Please use secure chat or  Call Office [phone]    [facility] 05/08/2023, 2:54 PM

## 2023-05-08 NOTE — Progress Notes (Addendum)
Occupational Therapy Treatment Patient Details Name: Christian [name] MRN: [id number] DOB: [birth year] Today's Date: 05/08/2023   History of present illness Pt is a 82 year old male admitted on 05/01/23 with left index finger swelling and erythema. Pt with hearing difficulties and needs sign language interpreter.  OR 8/22- s/p L index finger amputation, possible plan for further debridement; Plan for TEE Monday 8/26; PMH: hypertension, hyperlipidemia, type 2 diabetes with peripheral neuropathy, Charcot-Marie-Tooth disease, CAD, PAD, chronic HFpEF, CKD stage IIIa, history of transmetatarsal amputation of left foot, gastric ulcers, iron deficiency anemia, GERD.   OT comments  Pt in good spirits, eager to return home, agreeable to postacute placement while on IV antibiotics. Pt displays overall good safety awareness, great with reading lips during session, able to respond appropriately. Pt assisted to toilet with CGA, max A for hygiene and LB dressing. Pt required increased time for ADLs due to pain with movement and decreased FM skills due to stiffness in B hands. Pt would beneift grealty from continued skilled therapy, [facility] to post acute therapy, <3hrs/day      If plan is discharge home, recommend the following:  A little help with walking and/or transfers;Assistance with cooking/housework;A little help with bathing/dressing/bathroom;Help with stairs or ramp for entrance   Equipment Recommendations  None recommended by OT    Recommendations for Other Services      Precautions / Restrictions Precautions Precautions: Fall Precaution Comments: Contact precs; reports 4-5 falls a month Required Braces or Orthoses: Other Brace Other Brace: orthotic shoes in room (hx L TMA) Restrictions Weight Bearing Restrictions: Yes LUE Weight Bearing: Weight bear through elbow only Other Position/Activity Restrictions: WB through elbow L on PFRW preferred post-op       Mobility Bed  Mobility Overal bed mobility: Needs Assistance Bed Mobility: Supine to Sit     Supine to sit: Contact guard, HOB elevated, Used rails     General bed mobility comments: increased time, verbal cueing for scotting to edge with feet flat on floor    Transfers Overall transfer level: Needs assistance Equipment used: Left platform walker Transfers: Sit to/from Stand, Bed to chair/wheelchair/BSC Sit to Stand: From elevated surface, Min assist     Step pivot transfers: Contact guard assist     General transfer comment: good overall safety awareness, CGA     Balance Overall balance assessment: Needs assistance Sitting-balance support: No upper extremity supported Sitting balance-[name] Scale: Fair Sitting balance - Comments: limited with ROM sitting with dressing   Standing balance support: Bilateral upper extremity supported, During functional activity Standing balance-[name] Scale: Fair Standing balance comment: fair with L platform walker, CGA for ambulation/transfers, standing with toileting                           ADL either performed or assessed with clinical judgement   ADL Overall ADL's : Needs assistance/impaired Eating/Feeding: Set up;Sitting   Grooming: Sitting;Maximal assistance               Lower Body Dressing: Maximal assistance;Sit to/from stand;Sitting/lateral leans   Toilet Transfer: Contact guard assist;Ambulation;Rolling walker (2 wheels)   Toileting- Clothing Manipulation and Hygiene: Maximal assistance;Sit to/from stand Toileting - Clothing Manipulation Details (indicate cue type and reason): reports usually uses L hand for cleaning buttocks and would not attempt with rue. requests use of warm wet washcloths states he can not use toilet paper for wiping     Functional mobility during ADLs: Contact guard  assist;Rolling walker (2 wheels) General ADL Comments: Pt assisted to toilet CGA, verbal cueing for safety with IV management, assist  with wiping with warm washcloths    Extremity/Trunk Assessment Upper Extremity Assessment Upper Extremity Assessment: Generalized weakness;RUE deficits/detail;LUE deficits/detail            Vision       Perception     Praxis      Cognition Arousal: Alert Behavior During Therapy: WFL for tasks assessed/performed Overall Cognitive Status: Within Functional Limits for tasks assessed                                          Exercises      Shoulder Instructions       General Comments utilized in person ASL interpreter, PT donned shoes in seated EoB    Pertinent Vitals/ Pain       Pain Assessment Pain Assessment: Faces Faces Pain Scale: Hurts a little bit Pain Descriptors / Indicators: Grimacing, Discomfort Pain Intervention(s): Monitored during session  Home Living                                          Prior Functioning/Environment              Frequency  Min 1X/week        Progress Toward Goals  OT Goals(current goals can now be found in the care plan section)  Progress towards OT goals: Progressing toward goals  Acute Rehab OT Goals OT Goal Formulation: With patient Time For Goal Achievement: 05/16/23 Potential to Achieve Goals: Good ADL Goals Pt Will Perform Grooming: with modified independence;standing Pt Will Perform Upper Body Bathing: with modified independence;sitting Pt Will Perform Upper Body Dressing: with modified independence;sitting Pt Will Transfer to Toilet: with modified independence;ambulating;regular height toilet Pt Will Perform Toileting - Clothing Manipulation and hygiene: with modified independence;sit to/from stand  Plan      Co-evaluation                 AM-PAC OT "6 Clicks" Daily Activity     Outcome Measure   Help from another person eating meals?: A Little Help from another person taking care of personal grooming?: A Little Help from another person toileting, which  includes using toliet, bedpan, or urinal?: A Little Help from another person bathing (including washing, rinsing, drying)?: A Lot Help from another person to put on and taking off regular upper body clothing?: A Little Help from another person to put on and taking off regular lower body clothing?: A Lot 6 Click Score: 16    End of Session Equipment Utilized During Treatment: Gait belt;Other (comment) (L platform walker)  OT Visit Diagnosis: Unsteadiness on feet (R26.81);Other abnormalities of gait and mobility (R26.89);Pain;Muscle weakness (generalized) (M62.81);History of falling (Z91.81)   Activity Tolerance Patient tolerated treatment well   Patient Left in bed;with call bell/phone within reach   Nurse Communication Mobility status        Time: 1610-9604 OT Time Calculation (min): 31 min  Charges: OT General Charges $OT Visit: 1 Visit OT Treatments $Self Care/Home Management : 23-37 mins  [place], OTR/L   Alexis Goodell 05/08/2023, 5:14 PM

## 2023-05-08 NOTE — Progress Notes (Signed)
It has been brought to our attention that the TEE was done 8/26 with anesthesia end time at 1600 per Lelon Mast, RN.  PICC will be placed this evening if patient consents.

## 2023-05-08 NOTE — NC FL2 (Signed)
[place] MEDICAID FL2 LEVEL OF CARE FORM     IDENTIFICATION  Patient Name: Christian [name] Birthdate: 03/22/1941 Sex: male Admission Date (Current Location): 05/01/2023  [facility] and [place] Number:  Producer, television/film/video and Address:  [facility] [place], [address], [place], [place] [address]      Provider Number: [id number]  Attending Physician Name and Address:  Joseph Art, DO  Relative Name and Phone Number:  Phillips,Nicole Daughter   [phone]    Current Level of Care: Hospital Recommended Level of Care: Skilled Nursing Facility Prior Approval Number:    Date Approved/Denied:   PASRR Number: [id number]  Discharge Plan: SNF    Current Diagnoses: Patient Active Problem List   Diagnosis Date Noted   Osteomyelitis (HCC) 05/01/2023   Metabolic acidosis 05/01/2023   Hyponatremia 05/01/2023   Chronic heart failure with preserved ejection fraction (HFpEF) (HCC) 05/01/2023   Decreased functional mobility and endurance 06/15/2021   Pressure injury of skin 03/15/2021   Wound cellulitis 03/12/2021   Acute-on-chronic kidney injury (HCC) 03/12/2021   Fall at home, initial encounter 09/14/2020   Chronic kidney disease, stage III (moderate) (HCC) 09/11/2020   Gastro-esophageal reflux disease with esophagitis 03/16/2020   Intractable vomiting with nausea 03/13/2020   Decreased activities of daily living (ADL) 02/26/2020   Dysphagia 02/26/2020   Impaired functional mobility, balance, gait, and endurance 02/26/2020   Acute respiratory failure with hypoxia (HCC) 10/27/2019   Sepsis (HCC) 10/21/2019   Generalized anxiety disorder 09/01/2019   Primary insomnia 09/01/2019   GERD without esophagitis 07/23/2019   Meningioma (HCC) 07/23/2019   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   Ulcer of left foot, limited to breakdown of skin (HCC) 02/07/2018   Chronic pain of both shoulders 08/09/2017   Pain in  right wrist 08/09/2017   Atherosclerosis of renal artery (HCC) 04/24/2017   Type 2 diabetes mellitus (HCC) 04/24/2017   Chronic left-sided low back pain with left-sided sciatica 07/24/2016   Lower extremity edema 01/28/2015   Angina pectoris (HCC) 07/08/2014   Normocytic anemia 05/06/2014   Acute gastric ulcer 08/27/2013   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   IDA (iron deficiency anemia) 08/25/2013   Mixed hyperlipidemia 08/13/2013   Essential hypertension, benign 08/13/2013    Orientation RESPIRATION BLADDER Height & Weight     Self, Time, Situation, Place  Normal Continent Weight: 180 lb (81.6 kg) Height:  5\' 10"  (177.8 cm)  BEHAVIORAL SYMPTOMS/MOOD NEUROLOGICAL BOWEL NUTRITION STATUS      Continent Diet (see discharge summary)  AMBULATORY STATUS COMMUNICATION OF NEEDS Skin   Limited Assist Verbally Surgical wounds                       Personal Care Assistance Level of Assistance  Bathing, Feeding, Dressing Bathing Assistance: Limited assistance Feeding assistance: Limited assistance Dressing Assistance: Limited assistance     Functional Limitations Info  Sight, Hearing, Speech Sight Info: Adequate Hearing Info: Impaired (deaf: uses sign language) Speech Info: Impaired    SPECIAL CARE FACTORS FREQUENCY  PT (By licensed PT), OT (By licensed OT)     PT Frequency: 5x week OT Frequency: 5x week            Contractures Contractures Info: Not present    Additional Factors Info  Code Status, Allergies, Insulin Sliding Scale Code Status Info: full Allergies Info: lipitor   Insulin Sliding Scale Info: novolog: see discharge summary  Current Medications (05/08/2023):  This is the current hospital active medication list Current Facility-Administered Medications  Medication Dose Route Frequency Provider Last Rate Last Admin   acetaminophen (TYLENOL) tablet 650 mg  650 mg Oral Q6H PRN Jake Bathe, MD   650 mg at 05/08/23 0404   Or   acetaminophen  (TYLENOL) suppository 650 mg  650 mg Rectal Q6H PRN Jake Bathe, MD   650 mg at 05/07/23 1025   albuterol (PROVENTIL) (2.5 MG/3ML) 0.083% nebulizer solution 2.5 mg  2.5 mg Inhalation Q4H PRN Jake Bathe, MD   2.5 mg at 05/03/23 5366   DAPTOmycin (CUBICIN) 650 mg in sodium chloride 0.9 % IVPB  8 mg/kg Intravenous Q1400 Jake Bathe, MD 126 mL/hr at 05/06/23 1315 650 mg at 05/06/23 1315   furosemide (LASIX) tablet 40 mg  40 mg Oral Daily Jake Bathe, MD   40 mg at 05/08/23 0848   guaiFENesin (MUCINEX) 12 hr tablet 600 mg  600 mg Oral BID Jake Bathe, MD   600 mg at 05/08/23 0848   insulin aspart (novoLOG) injection 0-20 Units  0-20 Units Subcutaneous TID WC Marlin Canary U, DO   4 Units at 05/08/23 1143   insulin aspart (novoLOG) injection 0-5 Units  0-5 Units Subcutaneous QHS Vann, Jessica U, DO       isosorbide mononitrate (IMDUR) 24 hr tablet 60 mg  60 mg Oral Daily Jake Bathe, MD   60 mg at 05/08/23 0848   LORazepam (ATIVAN) tablet 0.5 mg  0.5 mg Oral Q6H PRN Jake Bathe, MD   0.5 mg at 05/08/23 0404   sodium chloride (OCEAN) 0.65 % nasal spray 2 spray  2 spray Each Nare BID Jake Bathe, MD   2 spray at 05/08/23 0849   tamsulosin (FLOMAX) capsule 0.4 mg  0.4 mg Oral Daily Jake Bathe, MD   0.4 mg at 05/08/23 0848   traZODone (DESYREL) tablet 25 mg  25 mg Oral QHS PRN Joseph Art, DO         Discharge Medications: Please see discharge summary for a list of discharge medications.  Relevant Imaging Results:  Relevant Lab Results:   Additional Information SSN: [id number]. Pt is deaf and uses sign language.   Pt will require IV ZDG:LOVFIEPPIR 650 mg IV q24h, through 06/14/23.  Lorri Frederick, LCSW

## 2023-05-09 DIAGNOSIS — N1832 Chronic kidney disease, stage 3b: Secondary | ICD-10-CM

## 2023-05-09 DIAGNOSIS — M869 Osteomyelitis, unspecified: Secondary | ICD-10-CM | POA: Diagnosis not present

## 2023-05-09 DIAGNOSIS — D649 Anemia, unspecified: Secondary | ICD-10-CM | POA: Diagnosis not present

## 2023-05-09 LAB — GLUCOSE, CAPILLARY
Glucose-Capillary: 207 mg/dL — ABNORMAL HIGH (ref 70–99)
Glucose-Capillary: 149 mg/dL — ABNORMAL HIGH (ref 70–99)
Glucose-Capillary: 167 mg/dL — ABNORMAL HIGH (ref 70–99)
Glucose-Capillary: 249 mg/dL — ABNORMAL HIGH (ref 70–99)
Glucose-Capillary: 213 mg/dL — ABNORMAL HIGH (ref 70–99)

## 2023-05-09 LAB — MISC LABCORP TEST (SEND OUT): Labcorp test code: 96388

## 2023-05-09 LAB — CBC
HCT: 34.8 % — ABNORMAL LOW (ref 39.0–52.0)
Hemoglobin: 11.1 g/dL — ABNORMAL LOW (ref 13.0–17.0)
MCH: 28.4 pg (ref 26.0–34.0)
MCHC: 31.9 g/dL (ref 30.0–36.0)
MCV: 89 fL (ref 80.0–100.0)
Platelets: 335 10*3/uL (ref 150–400)
RBC: 3.91 MIL/uL — ABNORMAL LOW (ref 4.22–5.81)
RDW: 13.3 % (ref 11.5–15.5)
WBC: 10.4 10*3/uL (ref 4.0–10.5)
nRBC: 0 % (ref 0.0–0.2)

## 2023-05-09 LAB — SURGICAL PATHOLOGY

## 2023-05-09 LAB — BASIC METABOLIC PANEL
Anion gap: 12 (ref 5–15)
BUN: 46 mg/dL — ABNORMAL HIGH (ref 8–23)
CO2: 27 mmol/L (ref 22–32)
Calcium: 8.4 mg/dL — ABNORMAL LOW (ref 8.9–10.3)
Chloride: 98 mmol/L (ref 98–111)
Creatinine, Ser: 1.63 mg/dL — ABNORMAL HIGH (ref 0.61–1.24)
GFR, Estimated: 43 mL/min — ABNORMAL LOW (ref >60)
Glucose, Bld: 181 mg/dL — ABNORMAL HIGH (ref 70–99)
Potassium: 3.6 mmol/L (ref 3.5–5.1)
Sodium: 137 mmol/L (ref 135–145)

## 2023-05-09 MED ORDER — INSULIN GLARGINE-YFGN 100 UNIT/ML ~~LOC~~ SOLN
5.0000 [IU] | Freq: Every day | SUBCUTANEOUS | Status: DC
  Administered 2023-05-09 – 2023-05-10 (×2): 5 [IU] via SUBCUTANEOUS
  Filled 2023-05-09 (×3): qty 0.05

## 2023-05-09 MED ORDER — LORAZEPAM 0.5 MG PO TABS
0.2500 mg | ORAL_TABLET | Freq: Three times a day (TID) | ORAL | Status: DC | PRN
  Administered 2023-05-09: 0.25 mg via ORAL
  Filled 2023-05-09: qty 1

## 2023-05-09 NOTE — Progress Notes (Signed)
   05/09/23 1444  Mobility  Activity Refused mobility  Mobility Specialist Start Time (ACUTE ONLY) 1407  Mobility Specialist Stop Time (ACUTE ONLY) 1412  Mobility Specialist Time Calculation (min) (ACUTE ONLY) 5 min   Mobility Specialist: Progress Note  Pt refused mobility session - received in bed.  Pt c/o trouble sleeping last night, that his pain meds kept him awake and he expressed that his nurse made him angry. Pt left with all needs met - call bell within reach.   Barnie Mort, BS Mobility Specialist Please contact via SecureChat or Rehab office at [phone].

## 2023-05-09 NOTE — TOC Progression Note (Addendum)
Transition of Care [facility]) - Progression Note    Patient Details  Name: Christian [name] MRN: [id number] Date of Birth: [birth year]  [facility] CM/SW Contact  Lorri Frederick, LCSW Phone Number: 05/09/2023, 11:34 AM  Clinical Narrative:   CSW spoke with daughter Crystal.  Her preference for SNF would be [place].  Referral sent out to [facility], [facility], therapeutic, [facility] [employer], [facility].  CSW reached out to those facilities.   1300: Message from [facility].  They cannot accept with d[facility], too expensive.    1445: [facility].  They can make bed offer.  CSW communicated with pt via whiteboard, he is agreeable to [facility].  CSW informed daughter Aggie Cosier, who is also in agreement with this plan.   Auth request submitted in [place] and approved: [id number], 6 days: 8/29-9/3.     Expected Discharge Plan: Skilled Nursing Facility Barriers to Discharge: Continued Medical Work up, SNF Pending bed offer  Expected Discharge Plan and Services In-house Referral: Clinical Social Work   Post Acute Care Choice: Skilled Nursing Facility Living arrangements for the past 2 months: Single Family Home                                       Social Determinants of Health (SDOH) Interventions SDOH Screenings   Tobacco Use: Low Risk  (05/07/2023)    Readmission Risk Interventions     No data to display

## 2023-05-09 NOTE — Progress Notes (Signed)
PROGRESS NOTE    Christian [name]  UEA:[id number] DOB: 1941/04/23 DOA: 05/01/2023 PCP: Montez Hageman, DO    Brief Narrative:  Christian [name] is a 82 y.o. male with hearing difficulties and needs sign language interpreter.  He has medical history significant of hypertension, hyperlipidemia, type 2 diabetes with peripheral neuropathy, Charcot-Marie-Tooth disease, CAD, PAD, chronic HFpEF, CKD stage IIIa, history of transmetatarsal amputation of left foot, gastric ulcers, iron deficiency anemia, GERD presenting with left index finger swelling and erythema.  Found to have staph bacteremia. TEE Negative.  Plan for IV Abx at SNF when bed available.   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 Plan is to continue with IV daptomycin till 10/3 per infectious disease. Plan is for placement to skilled nursing facility when bed is available.    CKD stage IIIa Mild metabolic acidosis Renal function close to baseline which is around 1.3-1.6.  Mild hyponatremia normalized   Fall at home Patient denies head injury or loss of consciousness.  No other injuries reported.   Type 2 diabetes -SSI- increased SSI as blood sugars high -change to carb mod diet   Chronic HFpEF Echo done in September 2023 showing EF 60 to 65%, grade 2 diastolic dysfunction.  No signs of volume overload at this time.  -resume lasix PO -check x ray: chronic lung scarring   Chronic normocytic anemia Hemoglobin 11.8, baseline 11-13.  No signs of bleeding.  Continue to monitor labs.  No indication for blood transfusion at this time.  Informed by the patient's daughter that patient is okay with receiving blood transfusions during this hospitalization if needed.    Hypertension: Blood pressure stable. Hyperlipidemia CAD: Patient is not endorsing any anginal symptoms. PVD GERD/PUD   DVT prophylaxis: SCDs Start: 05/01/23 2229    Code Status: Full Code   Disposition Plan:  Level of care: Telemetry Medical Status is: Inpatient Remains inpatient appropriate -- for IV abx will need to go to SNF   Consultants:  ID Hand surgery Cards (TEE)   Subjective: Somnolent this morning.  Opens his eyes but goes right back to sleep.  Vital signs noted to be stable.  Objective: Vitals:   05/08/23 1512 05/08/23 1951 05/09/23 0346 05/09/23 0700  BP: (!) 124/59 128/68 121/70 127/69  Pulse: 89 88 85 82  Resp: 18 18 16 17   Temp: 98.5 F (36.9 C) 98.9 F (37.2 C)  98.5 F (36.9 C)  TempSrc:  Oral  Oral  SpO2: 98% 99% 99% 99%  Weight:      Height:        Intake/Output Summary (Last 24 hours) at 05/09/2023 0907 Last data filed at 05/09/2023 0500 Gross per 24 hour  Intake 480 ml  Output 2400 ml  Net -1920 ml   Filed Weights   05/01/23 2300 05/07/23 1334  Weight: 82.1 kg 81.6 kg    Examination:   General appearance: Asleep Resp: Clear to auscultation bilaterally.  Normal effort Cardio: S1-S2 is normal regular.  No S3-S4.  No rubs murmurs or bruit GI: Abdomen is soft.  Nontender nondistended.  Bowel sounds are present normal.  No masses organomegaly    Data Reviewed:   CBC: Recent Labs  Lab 05/04/23 0439 05/05/23 0400 05/07/23 0506 05/08/23 0307 05/09/23 4782  WBC 9.2 9.2 8.7 10.9* 10.4  HGB 12.2* 11.0* 11.8* 12.1* 11.1*  HCT 37.5* 34.2* 36.5* 37.0* 34.8*  MCV 89.7 89.1 88.2 89.6 89.0  PLT 258 249 304 317 335   Basic Metabolic Panel: Recent Labs  Lab 05/04/23 0812 05/05/23 0400 05/07/23 0506 05/08/23 0307 05/09/23 0227  NA 136 135 136 132* 137  K 4.3 4.1 3.8 4.0 3.6  CL 103 102 100 98 98  CO2 21* 20* 28 24 27   GLUCOSE 160* 146* 158* 290* 181*  BUN 33* 33* 42* 41* 46*  CREATININE 1.73* 1.77* 1.62* 1.58* 1.63*   CALCIUM 8.2* 8.0* 8.3* 8.0* 8.4*  MG 1.8  --   --   --   --    GFR: Estimated Creatinine Clearance: 37.9 mL/min (A) (by C-G formula based on SCr of 1.63 mg/dL (H)).  Cardiac Enzymes: Recent Labs  Lab 05/05/23 0400  CKTOTAL 154    CBG: Recent Labs  Lab 05/07/23 2110 05/08/23 0730 05/08/23 1133 05/08/23 2150 05/09/23 0818  GLUCAP 380* 238* 177* 328* 207*    Recent Results (from the past 240 hour(s))  Blood culture (routine x 2)     Status: None   Collection Time: 05/01/23  8:23 PM   Specimen: BLOOD RIGHT ARM  Result Value Ref Range Status   Specimen Description BLOOD RIGHT ARM  Final   Special Requests   Final    BOTTLES DRAWN AEROBIC AND ANAEROBIC Blood Culture results may not be optimal due to an excessive volume of blood received in culture bottles   Culture   Final    NO GROWTH 5 DAYS Performed at [facility], [address], [place], [place] [address]    Report Status 05/06/2023 FINAL  Final  Blood culture (routine x 2)     Status: Abnormal (Preliminary result)   Collection Time: 05/01/23  8:28 PM   Specimen: BLOOD LEFT ARM  Result Value Ref Range Status   Specimen Description BLOOD LEFT ARM  Final   Special Requests   Final    BOTTLES DRAWN AEROBIC AND ANAEROBIC Blood Culture results may not be optimal due to an excessive volume of blood received in culture bottles   Culture  Setup Time   Final    GRAM POSITIVE COCCI IN CLUSTERS IN BOTH AEROBIC AND ANAEROBIC BOTTLES CRITICAL RESULT CALLED TO, READ BACK BY AND VERIFIED WITH: Neta Mends [id number] @ 2129 FH    Culture (A)  Final    METHICILLIN RESISTANT STAPHYLOCOCCUS AUREUS Sent to [facility] for further susceptibility testing. Performed at [facility], [address], [place], [place] [address]    Report Status PENDING  Incomplete   Organism ID, Bacteria METHICILLIN RESISTANT STAPHYLOCOCCUS AUREUS  Final      Susceptibility   Methicillin resistant staphylococcus aureus - MIC*     CIPROFLOXACIN >=8 RESISTANT Resistant     ERYTHROMYCIN >=8 RESISTANT Resistant     GENTAMICIN <=0.5 SENSITIVE Sensitive     OXACILLIN >=4 RESISTANT Resistant     TETRACYCLINE <=1 SENSITIVE Sensitive     VANCOMYCIN <=0.5 SENSITIVE Sensitive     TRIMETH/SULFA >=320 RESISTANT Resistant     CLINDAMYCIN <=0.25 SENSITIVE Sensitive     RIFAMPIN <=0.5 SENSITIVE Sensitive     Inducible Clindamycin NEGATIVE Sensitive     LINEZOLID 2 SENSITIVE Sensitive     * METHICILLIN RESISTANT STAPHYLOCOCCUS AUREUS  Blood Culture ID Panel (Reflexed)     Status: Abnormal   Collection Time: 05/01/23  8:28 PM  Result Value Ref Range Status   Enterococcus faecalis NOT DETECTED NOT DETECTED Final   Enterococcus Faecium NOT DETECTED NOT DETECTED Final   Listeria monocytogenes NOT DETECTED NOT DETECTED Final   Staphylococcus species DETECTED (A) NOT DETECTED Final    Comment: CRITICAL RESULT CALLED TO, READ BACK BY AND VERIFIED WITH: PHARMD Talmadge Chad [id number] @ 2129 FH    Staphylococcus aureus (BCID) DETECTED (A) NOT DETECTED Final    Comment: Methicillin (oxacillin)-resistant Staphylococcus aureus (MRSA). MRSA is predictably resistant to beta-lactam antibiotics (except ceftaroline). Preferred therapy is vancomycin unless clinically contraindicated. Patient requires contact precautions if  hospitalized. CRITICAL RESULT CALLED TO, READ BACK BY AND VERIFIED WITH: Neta Mends [id number] @ 2129 FH    Staphylococcus epidermidis NOT DETECTED NOT DETECTED Final   Staphylococcus lugdunensis NOT DETECTED NOT DETECTED Final   Streptococcus species NOT DETECTED NOT DETECTED Final   Streptococcus agalactiae NOT DETECTED NOT DETECTED Final   Streptococcus pneumoniae NOT DETECTED NOT DETECTED Final   Streptococcus pyogenes NOT DETECTED NOT DETECTED Final   A.calcoaceticus-baumannii NOT DETECTED NOT DETECTED Final   Bacteroides fragilis NOT DETECTED NOT DETECTED Final   Enterobacterales NOT DETECTED NOT DETECTED Final    Enterobacter cloacae complex NOT DETECTED NOT DETECTED Final   Escherichia coli NOT DETECTED NOT DETECTED Final   Klebsiella aerogenes NOT DETECTED NOT DETECTED Final   Klebsiella oxytoca NOT DETECTED NOT DETECTED Final   Klebsiella pneumoniae NOT DETECTED NOT DETECTED Final   Proteus species NOT DETECTED NOT DETECTED Final   Salmonella species NOT DETECTED NOT DETECTED Final   Serratia marcescens NOT DETECTED NOT DETECTED Final   Haemophilus influenzae NOT DETECTED NOT DETECTED Final   Neisseria meningitidis NOT DETECTED NOT DETECTED Final   Pseudomonas aeruginosa NOT DETECTED NOT DETECTED Final   Stenotrophomonas maltophilia NOT DETECTED NOT DETECTED Final   Candida albicans NOT DETECTED NOT DETECTED Final   Candida auris NOT DETECTED NOT DETECTED Final   Candida glabrata NOT DETECTED NOT DETECTED Final   Candida krusei NOT DETECTED NOT DETECTED Final   Candida parapsilosis NOT DETECTED NOT DETECTED Final   Candida tropicalis NOT DETECTED NOT DETECTED Final   Cryptococcus neoformans/gattii NOT DETECTED NOT DETECTED Final   Meth resistant mecA/C and MREJ DETECTED (A) NOT DETECTED Final    Comment: CRITICAL RESULT CALLED TO, READ BACK BY AND VERIFIED WITH: Neta Mends [id number] @ 2129 FH Performed at [facility], [address] [place], [place] [address]   Culture, blood (Routine X 2) w Reflex to ID Panel     Status: None   Collection Time: 05/02/23 11:09 PM   Specimen: BLOOD  Result Value Ref Range Status   Specimen Description BLOOD BLOOD RIGHT HAND  Final   Special Requests   Final    BOTTLES DRAWN AEROBIC AND ANAEROBIC Blood Culture results may not be optimal due to an inadequate volume of blood received in culture bottles   Culture   Final    NO GROWTH 5 DAYS Performed at [facility], [address] [place], [place] [address]    Report Status 05/07/2023 FINAL  Final  Culture, blood (Routine X 2) w Reflex to ID Panel     Status: None   Collection  Time: 05/02/23 11:09 PM   Specimen: BLOOD  Result Value Ref Range Status   Specimen Description BLOOD BLOOD RIGHT HAND  Final   Special Requests   Final    BOTTLES DRAWN AEROBIC AND ANAEROBIC Blood Culture adequate volume  Culture   Final    NO GROWTH 5 DAYS Performed at [facility], [address] [place], [place] [address]    Report Status 05/07/2023 FINAL  Final  Aerobic/Anaerobic Culture w Gram Stain (surgical/deep wound)     Status: None   Collection Time: 05/03/23  4:10 PM   Specimen: PATH Cytology Misc. fluid; Body Fluid  Result Value Ref Range Status   Specimen Description WOUND  Final   Special Requests LEFT INDEX FINGER  Final   Gram Stain NO WBC SEEN NO ORGANISMS SEEN   Final   Culture   Final    RARE METHICILLIN RESISTANT STAPHYLOCOCCUS AUREUS NO ANAEROBES ISOLATED Performed at [facility], [address], [place], [place] [address]    Report Status 05/08/2023 FINAL  Final   Organism ID, Bacteria METHICILLIN RESISTANT STAPHYLOCOCCUS AUREUS  Final      Susceptibility   Methicillin resistant staphylococcus aureus - MIC*    CIPROFLOXACIN >=8 RESISTANT Resistant     ERYTHROMYCIN >=8 RESISTANT Resistant     GENTAMICIN <=0.5 SENSITIVE Sensitive     OXACILLIN >=4 RESISTANT Resistant     TETRACYCLINE <=1 SENSITIVE Sensitive     VANCOMYCIN <=0.5 SENSITIVE Sensitive     TRIMETH/SULFA >=320 RESISTANT Resistant     CLINDAMYCIN <=0.25 SENSITIVE Sensitive     RIFAMPIN <=0.5 SENSITIVE Sensitive     Inducible Clindamycin NEGATIVE Sensitive     LINEZOLID 2 SENSITIVE Sensitive     * RARE METHICILLIN RESISTANT STAPHYLOCOCCUS AUREUS  Acid Fast Smear (AFB)     Status: None   Collection Time: 05/03/23  4:10 PM   Specimen: PATH Cytology Misc. fluid; Body Fluid  Result Value Ref Range Status   AFB Specimen Processing Concentration  Final   Acid Fast Smear Negative  Final    Comment: (NOTE) Performed At: [facility] [address] [place], [place]  [id number] Jolene Schimke MD WG:[id number]    Source (AFB) LEFT INDEX FINGER  Final    Comment: Performed at [facility], [address], [place], [place] [address]  MRSA Next Gen by PCR, Nasal     Status: Abnormal   Collection Time: 05/08/23 11:04 AM   Specimen: Nasal Mucosa; Nasal Swab  Result Value Ref Range Status   MRSA by PCR Next Gen DETECTED (A) NOT DETECTED Final    Comment: RESULT CALLED TO, READ BACK BY AND VERIFIED WITH: [id number] AT 1307 TO RN Johny Shock, ADC (NOTE) The GeneXpert MRSA Assay (FDA approved for NASAL specimens only), is one component of a comprehensive MRSA colonization surveillance program. It is not intended to diagnose MRSA infection nor to guide or monitor treatment for MRSA infections. Test performance is not FDA approved in patients less than 49 years old. Performed at [facility], [address], [place], [place] [address]          Radiology Studies: [place] EKG SITE RITE  Result Date: 05/08/2023 If [facility] image not attached, placement could not be confirmed due to current cardiac rhythm.  ECHO TEE  Result Date: 05/07/2023    TRANSESOPHOGEAL ECHO REPORT   Patient Name:   Christian [name] Date of Exam: 05/07/2023 Medical Rec #:  [id number]        Height:       68.0 in Accession #:    [id number]       Weight:       181.0 lb Date of Birth:  26-Aug-1941  BSA:          1.959 m Patient Age:    81 years         BP:           111/72 mmHg Patient Gender: M                HR:           80 bpm. Exam Location:  Inpatient Procedure: Transesophageal Echo, Cardiac Doppler, Color Doppler and 3D Echo Indications:    Bacteremia  History:        Patient has prior history of Echocardiogram examinations, most                 recent 05/03/2023. Risk Factors:Hypertension, Diabetes and                 Dyslipidemia.  Sonographer:    Milda Smart Referring Phys: [id number] [facility] PROCEDURE: After discussion of the risks and benefits of a TEE, an informed  consent was obtained from the patient. TEE procedure time was 9 minutes. The transesophogeal probe was passed without difficulty through the esophogus of the patient. Imaged were  obtained with the patient in a left lateral decubitus position. Sedation performed by different physician. The patient was monitored while under deep sedation. Anesthestetic sedation was provided intravenously by Anesthesiology: 186.88mg  of Propofol. Image quality was good. The patient's vital signs; including heart rate, blood pressure, and oxygen saturation; remained stable throughout the procedure. The patient developed no complications during the procedure.  IMPRESSIONS  1. Left ventricular ejection fraction, by estimation, is 55 to 60%. The left ventricle has normal function. The left ventricle has no regional wall motion abnormalities.  2. Right ventricular systolic function is normal. The right ventricular size is normal.  3. No left atrial/left atrial appendage thrombus was detected.  4. The mitral valve is grossly normal. Mild mitral valve regurgitation. No evidence of mitral stenosis.  5. The aortic valve is tricuspid. Aortic valve regurgitation is not visualized. No aortic stenosis is present.  6. There is mild (Grade II) plaque.  7. The inferior vena cava is normal in size with greater than 50% respiratory variability, suggesting right atrial pressure of 3 mmHg. Conclusion(s)/Recommendation(s): No evidence of vegetation/infective endocarditis on this transesophageael echocardiogram. FINDINGS  Left Ventricle: Left ventricular ejection fraction, by estimation, is 55 to 60%. The left ventricle has normal function. The left ventricle has no regional wall motion abnormalities. The left ventricular internal cavity size was normal in size. There is  no left ventricular hypertrophy. Right Ventricle: The right ventricular size is normal. No increase in right ventricular wall thickness. Right ventricular systolic function is normal. Left  Atrium: Left atrial size was normal in size. No left atrial/left atrial appendage thrombus was detected. Right Atrium: Right atrial size was normal in size. Pericardium: There is no evidence of pericardial effusion. Mitral Valve: The mitral valve is grossly normal. There is mild calcification of the mitral valve leaflet(s). Mild mitral valve regurgitation. No evidence of mitral valve stenosis. Tricuspid Valve: The tricuspid valve is normal in structure. Tricuspid valve regurgitation is trivial. No evidence of tricuspid stenosis. Aortic Valve: The aortic valve is tricuspid. Aortic valve regurgitation is not visualized. No aortic stenosis is present. Pulmonic Valve: The pulmonic valve was normal in structure. Pulmonic valve regurgitation is not visualized. No evidence of pulmonic stenosis. Aorta: The aortic root is normal in size and structure. There is mild (Grade II) plaque. Venous: The inferior vena cava is normal  in size with greater than 50% respiratory variability, suggesting right atrial pressure of 3 mmHg. IAS/Shunts: No atrial level shunt detected by color flow Doppler. Additional Comments: Spectral Doppler performed. Donato Schultz MD Electronically signed by Donato Schultz MD Signature Date/Time: 05/07/2023/4:23:19 PM    Final    EP STUDY  Result Date: 05/07/2023 See surgical note for result.       Scheduled Meds:  Chlorhexidine Gluconate Cloth  6 each Topical Q0600   furosemide  40 mg Oral Daily   guaiFENesin  600 mg Oral BID   insulin aspart  0-20 Units Subcutaneous TID WC   insulin aspart  0-5 Units Subcutaneous QHS   isosorbide mononitrate  60 mg Oral Daily   mupirocin ointment  1 Application Nasal BID   sodium chloride  2 spray Each Nare BID   sodium chloride flush  10-40 mL Intracatheter Q12H   tamsulosin  0.4 mg Oral Daily   Continuous Infusions:  DAPTOmycin (CUBICIN) 650 mg in sodium chloride 0.9 % IVPB 650 mg (05/08/23 1511)     LOS: 8 days    Osvaldo Shipper,  [facility] Available via Epic secure chat 7am-7pm After these hours, please refer to coverage provider listed on [email or web address] 05/09/2023, 9:07 AM

## 2023-05-09 NOTE — Plan of Care (Signed)
  Problem: Nutritional: Goal: Maintenance of adequate nutrition will improve Outcome: Progressing   Problem: Education: Goal: Knowledge of General Education information will improve Description: Including pain rating scale, medication(s)/side effects and non-pharmacologic comfort measures Outcome: Progressing

## 2023-05-10 DIAGNOSIS — M869 Osteomyelitis, unspecified: Secondary | ICD-10-CM | POA: Diagnosis not present

## 2023-05-10 LAB — GLUCOSE, CAPILLARY
Glucose-Capillary: 126 mg/dL — ABNORMAL HIGH (ref 70–99)
Glucose-Capillary: 227 mg/dL — ABNORMAL HIGH (ref 70–99)

## 2023-05-10 MED ORDER — INSULIN GLARGINE-YFGN 100 UNIT/ML ~~LOC~~ SOLN: 5.0000 [IU] | Freq: Every day | SUBCUTANEOUS | Status: AC

## 2023-05-10 MED ORDER — GUAIFENESIN ER 600 MG PO TB12: 600.0000 mg | ORAL_TABLET | Freq: Two times a day (BID) | ORAL | Status: AC

## 2023-05-10 MED ORDER — TRAZODONE HCL 50 MG PO TABS: 25.0000 mg | ORAL_TABLET | Freq: Every evening | ORAL | Status: AC | PRN

## 2023-05-10 MED ORDER — DAPTOMYCIN IV (FOR PTA / DISCHARGE USE ONLY): 650.0000 mg | INTRAVENOUS | 0 refills | Status: AC

## 2023-05-10 MED ORDER — SALINE SPRAY 0.65 % NA SOLN: 2.0000 | Freq: Two times a day (BID) | NASAL | Status: AC

## 2023-05-10 MED ORDER — HEPARIN SOD (PORK) LOCK FLUSH 100 UNIT/ML IV SOLN
250.0000 [IU] | INTRAVENOUS | Status: AC | PRN
  Administered 2023-05-10: 250 [IU]
  Filled 2023-05-10: qty 3

## 2023-05-10 MED ORDER — LORAZEPAM 0.5 MG PO TABS: 0.2500 mg | ORAL_TABLET | Freq: Two times a day (BID) | ORAL | 0 refills | Status: AC | PRN

## 2023-05-10 NOTE — Discharge Summary (Addendum)
[facility]  Physician Discharge Summary   Patient ID: MAVIN OSORTO MRN: [id number] DOB/AGE: 80/11/1940 80 y.o.  Admit date: 05/01/2023 Discharge date:   05/10/2023   PCP: Montez Hageman, DO  DISCHARGE DIAGNOSES:    Osteomyelitis (HCC)   Mixed hyperlipidemia   Essential hypertension, benign   Normocytic anemia   Type 2 diabetes mellitus (HCC)   Chronic kidney disease, stage III (moderate) (HCC)   Metabolic acidosis   Hyponatremia   Chronic heart failure with preserved ejection fraction (HFpEF) (HCC)   RECOMMENDATIONS FOR OUTPATIENT FOLLOW UP: Please check CBC and basic metabolic panel in 1 week Monitor CBGs Follow-up for Dr. Janee Morn with hand surgery as needed   Home Health: Going to SNF Equipment/Devices: None  CODE STATUS: Full code  DISCHARGE CONDITION: fair  Wound care instruction/dressing instructions: Clean the wound on the left hand with normal saline.  Apply dry gauze and cover with bandage.  Can stop doing dressing changes once the wound does not have any active drainage.  Diet recommendation: Modified carbohydrate  INITIAL HISTORY: MILDRED STROSNIDER is a 82 y.o. male with hearing difficulties and needs sign language interpreter.  He has medical history significant of hypertension, hyperlipidemia, type 2 diabetes with peripheral neuropathy, Charcot-Marie-Tooth disease, CAD, PAD, chronic HFpEF, CKD stage IIIa, history of transmetatarsal amputation of left foot, gastric ulcers, iron deficiency anemia, GERD presenting with left index finger swelling and erythema.  Found to have staph bacteremia. TEE Negative.  Plan for IV Abx at SNF when bed available.    HOSPITAL COURSE:   MRSA bacteremia and Left index finger osteomyelitis -Blood cultures positive for MRSA-- repeat cultures negative  -OR 8/22- s/p amputation-- - Per hand surgery: Dressings can be discontinued when there is no longer any active drainage  - TEE negative -PICC line was placed on  8/27. Plan is to continue with IV daptomycin till 10/3 per infectious disease. Plan is for placement to skilled nursing facility when bed is available.     CKD stage IIIa Mild metabolic acidosis Renal function close to baseline which is around 1.3-1.6.   Mild hyponatremia normalized   Fall at home Patient denies head injury or loss of consciousness.  No other injuries reported.   Type 2 diabetes -SSI- increased SSI as blood sugars high -change to carb mod diet   Chronic HFpEF Echo done in September 2023 showing EF 60 to 65%, grade 2 diastolic dysfunction.  No signs of volume overload at this time.  -resume lasix PO -Chest x ray: chronic lung scarring   Chronic normocytic anemia Hemoglobin is at baseline.  No active bleeding noted.   Hypertension: Blood pressure stable. Hyperlipidemia CAD: Patient is not endorsing any anginal symptoms. PVD GERD/PUD  Patient is stable.  Did not have any questions this morning.  Okay for discharge to SNF.   PERTINENT LABS:  The results of significant diagnostics from this hospitalization (including imaging, microbiology, ancillary and laboratory) are listed below for reference.    Microbiology: Recent Results (from the past 240 hour(s))  Blood culture (routine x 2)     Status: None   Collection Time: 05/01/23  8:23 PM   Specimen: BLOOD RIGHT ARM  Result Value Ref Range Status   Specimen Description BLOOD RIGHT ARM  Final   Special Requests   Final    BOTTLES DRAWN AEROBIC AND ANAEROBIC Blood Culture results may not be optimal due to an excessive volume of blood received in culture bottles   Culture   Final  NO GROWTH 5 DAYS Performed at [facility], [address] [place], [place] [address]    Report Status 05/06/2023 FINAL  Final  Blood culture (routine x 2)     Status: Abnormal (Preliminary result)   Collection Time: 05/01/23  8:28 PM   Specimen: BLOOD LEFT ARM  Result Value Ref Range Status   Specimen Description  BLOOD LEFT ARM  Final   Special Requests   Final    BOTTLES DRAWN AEROBIC AND ANAEROBIC Blood Culture results may not be optimal due to an excessive volume of blood received in culture bottles   Culture  Setup Time   Final    GRAM POSITIVE COCCI IN CLUSTERS IN BOTH AEROBIC AND ANAEROBIC BOTTLES CRITICAL RESULT CALLED TO, READ BACK BY AND VERIFIED WITH: Neta Mends [id number] @ 2129 FH    Culture (A)  Final    METHICILLIN RESISTANT STAPHYLOCOCCUS AUREUS Sent to [facility] for further susceptibility testing. Performed at [facility], [address] [place], [place] [address]    Report Status PENDING  Incomplete   Organism ID, Bacteria METHICILLIN RESISTANT STAPHYLOCOCCUS AUREUS  Final      Susceptibility   Methicillin resistant staphylococcus aureus - MIC*    CIPROFLOXACIN >=8 RESISTANT Resistant     ERYTHROMYCIN >=8 RESISTANT Resistant     GENTAMICIN <=0.5 SENSITIVE Sensitive     OXACILLIN >=4 RESISTANT Resistant     TETRACYCLINE <=1 SENSITIVE Sensitive     VANCOMYCIN <=0.5 SENSITIVE Sensitive     TRIMETH/SULFA >=320 RESISTANT Resistant     CLINDAMYCIN <=0.25 SENSITIVE Sensitive     RIFAMPIN <=0.5 SENSITIVE Sensitive     Inducible Clindamycin NEGATIVE Sensitive     LINEZOLID 2 SENSITIVE Sensitive     * METHICILLIN RESISTANT STAPHYLOCOCCUS AUREUS  Blood Culture ID Panel (Reflexed)     Status: Abnormal   Collection Time: 05/01/23  8:28 PM  Result Value Ref Range Status   Enterococcus faecalis NOT DETECTED NOT DETECTED Final   Enterococcus Faecium NOT DETECTED NOT DETECTED Final   Listeria monocytogenes NOT DETECTED NOT DETECTED Final   Staphylococcus species DETECTED (A) NOT DETECTED Final    Comment: CRITICAL RESULT CALLED TO, READ BACK BY AND VERIFIED WITH: PHARMD Talmadge Chad [id number] @ 2129 FH    Staphylococcus aureus (BCID) DETECTED (A) NOT DETECTED Final    Comment: Methicillin (oxacillin)-resistant Staphylococcus aureus (MRSA). MRSA is predictably resistant to  beta-lactam antibiotics (except ceftaroline). Preferred therapy is vancomycin unless clinically contraindicated. Patient requires contact precautions if  hospitalized. CRITICAL RESULT CALLED TO, READ BACK BY AND VERIFIED WITH: Neta Mends [id number] @ 2129 FH    Staphylococcus epidermidis NOT DETECTED NOT DETECTED Final   Staphylococcus lugdunensis NOT DETECTED NOT DETECTED Final   Streptococcus species NOT DETECTED NOT DETECTED Final   Streptococcus agalactiae NOT DETECTED NOT DETECTED Final   Streptococcus pneumoniae NOT DETECTED NOT DETECTED Final   Streptococcus pyogenes NOT DETECTED NOT DETECTED Final   A.calcoaceticus-baumannii NOT DETECTED NOT DETECTED Final   Bacteroides fragilis NOT DETECTED NOT DETECTED Final   Enterobacterales NOT DETECTED NOT DETECTED Final   Enterobacter cloacae complex NOT DETECTED NOT DETECTED Final   Escherichia coli NOT DETECTED NOT DETECTED Final   Klebsiella aerogenes NOT DETECTED NOT DETECTED Final   Klebsiella oxytoca NOT DETECTED NOT DETECTED Final   Klebsiella pneumoniae NOT DETECTED NOT DETECTED Final   Proteus species NOT DETECTED NOT DETECTED Final   Salmonella species NOT DETECTED NOT DETECTED Final   Serratia marcescens NOT DETECTED  NOT DETECTED Final   Haemophilus influenzae NOT DETECTED NOT DETECTED Final   Neisseria meningitidis NOT DETECTED NOT DETECTED Final   Pseudomonas aeruginosa NOT DETECTED NOT DETECTED Final   Stenotrophomonas maltophilia NOT DETECTED NOT DETECTED Final   Candida albicans NOT DETECTED NOT DETECTED Final   Candida auris NOT DETECTED NOT DETECTED Final   Candida glabrata NOT DETECTED NOT DETECTED Final   Candida krusei NOT DETECTED NOT DETECTED Final   Candida parapsilosis NOT DETECTED NOT DETECTED Final   Candida tropicalis NOT DETECTED NOT DETECTED Final   Cryptococcus neoformans/gattii NOT DETECTED NOT DETECTED Final   Meth resistant mecA/C and MREJ DETECTED (A) NOT DETECTED Final    Comment: CRITICAL  RESULT CALLED TO, READ BACK BY AND VERIFIED WITH: Neta Mends [id number] @ 2129 FH Performed at [facility], [address], [place], [place] [address]   Culture, blood (Routine X 2) w Reflex to ID Panel     Status: None   Collection Time: 05/02/23 11:09 PM   Specimen: BLOOD  Result Value Ref Range Status   Specimen Description BLOOD BLOOD RIGHT HAND  Final   Special Requests   Final    BOTTLES DRAWN AEROBIC AND ANAEROBIC Blood Culture results may not be optimal due to an inadequate volume of blood received in culture bottles   Culture   Final    NO GROWTH 5 DAYS Performed at [facility], [address] [place], [place] [address]    Report Status 05/07/2023 FINAL  Final  Culture, blood (Routine X 2) w Reflex to ID Panel     Status: None   Collection Time: 05/02/23 11:09 PM   Specimen: BLOOD  Result Value Ref Range Status   Specimen Description BLOOD BLOOD RIGHT HAND  Final   Special Requests   Final    BOTTLES DRAWN AEROBIC AND ANAEROBIC Blood Culture adequate volume   Culture   Final    NO GROWTH 5 DAYS Performed at [facility], [address] [place], [place] [address]    Report Status 05/07/2023 FINAL  Final  Aerobic/Anaerobic Culture w Gram Stain (surgical/deep wound)     Status: None   Collection Time: 05/03/23  4:10 PM   Specimen: PATH Cytology Misc. fluid; Body Fluid  Result Value Ref Range Status   Specimen Description WOUND  Final   Special Requests LEFT INDEX FINGER  Final   Gram Stain NO WBC SEEN NO ORGANISMS SEEN   Final   Culture   Final    RARE METHICILLIN RESISTANT STAPHYLOCOCCUS AUREUS NO ANAEROBES ISOLATED Performed at [facility], [address] [place], [place] [address]    Report Status 05/08/2023 FINAL  Final   Organism ID, Bacteria METHICILLIN RESISTANT STAPHYLOCOCCUS AUREUS  Final      Susceptibility   Methicillin resistant staphylococcus aureus - MIC*    CIPROFLOXACIN >=8 RESISTANT Resistant     ERYTHROMYCIN  >=8 RESISTANT Resistant     GENTAMICIN <=0.5 SENSITIVE Sensitive     OXACILLIN >=4 RESISTANT Resistant     TETRACYCLINE <=1 SENSITIVE Sensitive     VANCOMYCIN <=0.5 SENSITIVE Sensitive     TRIMETH/SULFA >=320 RESISTANT Resistant     CLINDAMYCIN <=0.25 SENSITIVE Sensitive     RIFAMPIN <=0.5 SENSITIVE Sensitive     Inducible Clindamycin NEGATIVE Sensitive     LINEZOLID 2 SENSITIVE Sensitive     * RARE METHICILLIN RESISTANT STAPHYLOCOCCUS AUREUS  Acid Fast Smear (AFB)     Status: None  Collection Time: 05/03/23  4:10 PM   Specimen: PATH Cytology Misc. fluid; Body Fluid  Result Value Ref Range Status   AFB Specimen Processing Concentration  Final   Acid Fast Smear Negative  Final    Comment: (NOTE) Performed At: [facility] [address] [place], [place] [id number] Jolene Schimke MD HC:[id number]    Source (AFB) LEFT INDEX FINGER  Final    Comment: Performed at [facility], [address] [place] [address], [place], [place] [address]  MRSA Next Gen by PCR, Nasal     Status: Abnormal   Collection Time: 05/08/23 11:04 AM   Specimen: Nasal Mucosa; Nasal Swab  Result Value Ref Range Status   MRSA by PCR Next Gen DETECTED (A) NOT DETECTED Final    Comment: RESULT CALLED TO, READ BACK BY AND VERIFIED WITH: [id number] AT 1307 TO RN Johny Shock, ADC (NOTE) The GeneXpert MRSA Assay (FDA approved for NASAL specimens only), is one component of a comprehensive MRSA colonization surveillance program. It is not intended to diagnose MRSA infection nor to guide or monitor treatment for MRSA infections. Test performance is not FDA approved in patients less than 71 years old. Performed at [facility], [address], [place], [place] [address]      Labs:   Basic Metabolic Panel: Recent Labs  Lab 05/04/23 [phone] 05/05/23 0400 05/07/23 0506 05/08/23 0307 05/09/23 0227  NA 136 135 136 132* 137  K 4.3 4.1 3.8 4.0 3.6  CL 103 102 100 98 98  CO2 21* 20* 28 24 27   GLUCOSE 160* 146* 158*  290* 181*  BUN 33* 33* 42* 41* 46*  CREATININE 1.73* 1.77* 1.62* 1.58* 1.63*  CALCIUM 8.2* 8.0* 8.3* 8.0* 8.4*  MG 1.8  --   --   --   --     CBC: Recent Labs  Lab 05/04/23 0439 05/05/23 0400 05/07/23 0506 05/08/23 0307 05/09/23 0227  WBC 9.2 9.2 8.7 10.9* 10.4  HGB 12.2* 11.0* 11.8* 12.1* 11.1*  HCT 37.5* 34.2* 36.5* 37.0* 34.8*  MCV 89.7 89.1 88.2 89.6 89.0  PLT 258 249 304 317 335   Cardiac Enzymes: Recent Labs  Lab 05/05/23 0400  CKTOTAL 154   BNP: BNP (last 3 results) Recent Labs    05/02/23 0329  BNP 66.9     CBG: Recent Labs  Lab 05/09/23 1145 05/09/23 1626 05/09/23 1920 05/09/23 2157 05/10/23 0756  GLUCAP 149* 167* 249* 213* 126*     IMAGING STUDIES [place] EKG SITE RITE  Result Date: 05/08/2023 If Site Rite image not attached, placement could not be confirmed due to current cardiac rhythm.  ECHO TEE  Result Date: 05/07/2023    TRANSESOPHOGEAL ECHO REPORT   Patient Name:   NAM REISH Date of Exam: 05/07/2023 Medical Rec #:  [id number]        Height:       68.0 in Accession #:    [id number]       Weight:       181.0 lb Date of Birth:  01/01/1941       BSA:          1.959 m Patient Age:    81 years         BP:           111/72 mmHg Patient Gender: M                HR:           80 bpm.  Exam Location:  Inpatient Procedure: Transesophageal Echo, Cardiac Doppler, Color Doppler and 3D Echo Indications:    Bacteremia  History:        Patient has prior history of Echocardiogram examinations, most                 recent 05/03/2023. Risk Factors:Hypertension, Diabetes and                 Dyslipidemia.  Sonographer:    Milda Smart Referring Phys: [id number] [facility] PROCEDURE: After discussion of the risks and benefits of a TEE, an informed consent was obtained from the patient. TEE procedure time was 9 minutes. The transesophogeal probe was passed without difficulty through the esophogus of the patient. Imaged were  obtained with the patient in a left lateral  decubitus position. Sedation performed by different physician. The patient was monitored while under deep sedation. Anesthestetic sedation was provided intravenously by Anesthesiology: 186.88mg  of Propofol. Image quality was good. The patient's vital signs; including heart rate, blood pressure, and oxygen saturation; remained stable throughout the procedure. The patient developed no complications during the procedure.  IMPRESSIONS  1. Left ventricular ejection fraction, by estimation, is 55 to 60%. The left ventricle has normal function. The left ventricle has no regional wall motion abnormalities.  2. Right ventricular systolic function is normal. The right ventricular size is normal.  3. No left atrial/left atrial appendage thrombus was detected.  4. The mitral valve is grossly normal. Mild mitral valve regurgitation. No evidence of mitral stenosis.  5. The aortic valve is tricuspid. Aortic valve regurgitation is not visualized. No aortic stenosis is present.  6. There is mild (Grade II) plaque.  7. The inferior vena cava is normal in size with greater than 50% respiratory variability, suggesting right atrial pressure of 3 mmHg. Conclusion(s)/Recommendation(s): No evidence of vegetation/infective endocarditis on this transesophageael echocardiogram. FINDINGS  Left Ventricle: Left ventricular ejection fraction, by estimation, is 55 to 60%. The left ventricle has normal function. The left ventricle has no regional wall motion abnormalities. The left ventricular internal cavity size was normal in size. There is  no left ventricular hypertrophy. Right Ventricle: The right ventricular size is normal. No increase in right ventricular wall thickness. Right ventricular systolic function is normal. Left Atrium: Left atrial size was normal in size. No left atrial/left atrial appendage thrombus was detected. Right Atrium: Right atrial size was normal in size. Pericardium: There is no evidence of pericardial effusion. Mitral  Valve: The mitral valve is grossly normal. There is mild calcification of the mitral valve leaflet(s). Mild mitral valve regurgitation. No evidence of mitral valve stenosis. Tricuspid Valve: The tricuspid valve is normal in structure. Tricuspid valve regurgitation is trivial. No evidence of tricuspid stenosis. Aortic Valve: The aortic valve is tricuspid. Aortic valve regurgitation is not visualized. No aortic stenosis is present. Pulmonic Valve: The pulmonic valve was normal in structure. Pulmonic valve regurgitation is not visualized. No evidence of pulmonic stenosis. Aorta: The aortic root is normal in size and structure. There is mild (Grade II) plaque. Venous: The inferior vena cava is normal in size with greater than 50% respiratory variability, suggesting right atrial pressure of 3 mmHg. IAS/Shunts: No atrial level shunt detected by color flow Doppler. Additional Comments: Spectral Doppler performed. Donato Schultz MD Electronically signed by Donato Schultz MD Signature Date/Time: 05/07/2023/4:23:19 PM    Final    EP STUDY  Result Date: 05/07/2023 See surgical note for result.  DG CHEST PORT 1 VIEW  Result Date: 05/06/2023  CLINICAL DATA:  Dyspnea. EXAM: PORTABLE CHEST 1 VIEW COMPARISON:  03/13/2022 FINDINGS: Chronic elevation of left hemidiaphragm. Adjacent left lung base scarring. Normal heart size with stable mediastinal contours. No focal airspace disease, pleural effusion, or pneumothorax. Chronic bilateral shoulder arthropathy. IMPRESSION: Chronic elevation of left hemidiaphragm with adjacent left lung base scarring. Electronically Signed   By: Narda Rutherford M.D.   On: 05/06/2023 14:52   ECHOCARDIOGRAM COMPLETE  Result Date: 05/03/2023    ECHOCARDIOGRAM REPORT   Patient Name:   FERDINANDO TRAINO Date of Exam: 05/03/2023 Medical Rec #:  [id number]        Height:       68.0 in Accession #:    [id number]       Weight:       181.0 lb Date of Birth:  [birth year]       BSA:          1.959 m Patient Age:     57 years         BP:           136/69 mmHg Patient Gender: M                HR:           84 bpm. Exam Location:  Inpatient Procedure: 2D Echo, Cardiac Doppler and Color Doppler Indications:    Bacteremia R78.81  History:        Patient has prior history of Echocardiogram examinations, most                 recent 05/30/2022. CHF, Angina; Risk Factors:Hypertension and                 Diabetes. CKD, stage 3.  Sonographer:    Lucendia Herrlich Referring Phys: [id number] CORNELIUS N VAN DAM IMPRESSIONS  1. Left ventricular ejection fraction, by estimation, is 60 to 65%. The left ventricle has normal function. The left ventricle has no regional wall motion abnormalities. Left ventricular diastolic parameters were normal.  2. Right ventricular systolic function is normal. The right ventricular size is normal. There is normal pulmonary artery systolic pressure.  3. The mitral valve is degenerative. Trivial mitral valve regurgitation. No evidence of mitral stenosis.  4. The aortic valve was not well visualized. There is mild calcification of the aortic valve. There is mild thickening of the aortic valve. Aortic valve regurgitation is not visualized. Aortic valve sclerosis is present, with no evidence of aortic valve  stenosis.  5. The inferior vena cava is dilated in size with >50% respiratory variability, suggesting right atrial pressure of 8 mmHg. Comparison(s): No significant change from prior study. Prior images reviewed side by side. Conclusion(s)/Recommendation(s): No evidence of valvular vegetations on this transthoracic echocardiogram. Consider a transesophageal echocardiogram to exclude infective endocarditis if clinically indicated. FINDINGS  Left Ventricle: Left ventricular ejection fraction, by estimation, is 60 to 65%. The left ventricle has normal function. The left ventricle has no regional wall motion abnormalities. The left ventricular internal cavity size was normal in size. There is  no left ventricular  hypertrophy. Left ventricular diastolic parameters were normal. Right Ventricle: The right ventricular size is normal. Right vetricular wall thickness was not well visualized. Right ventricular systolic function is normal. There is normal pulmonary artery systolic pressure. The tricuspid regurgitant velocity is 1.04 m/s, and with an assumed right atrial pressure of 8 mmHg, the estimated right ventricular systolic pressure is 12.3 mmHg. Left Atrium: Left atrial size was normal in size. Right Atrium:  Right atrial size was normal in size. Pericardium: There is no evidence of pericardial effusion. Mitral Valve: The mitral valve is degenerative in appearance. There is mild thickening of the mitral valve leaflet(s). There is mild calcification of the mitral valve leaflet(s). Trivial mitral valve regurgitation. No evidence of mitral valve stenosis. MV peak gradient, 8.2 mmHg. The mean mitral valve gradient is 3.5 mmHg. Tricuspid Valve: The tricuspid valve is not well visualized. Tricuspid valve regurgitation is trivial. No evidence of tricuspid stenosis. Aortic Valve: The aortic valve was not well visualized. There is mild calcification of the aortic valve. There is mild thickening of the aortic valve. Aortic valve regurgitation is not visualized. Aortic valve sclerosis is present, with no evidence of aortic valve stenosis. Aortic valve peak gradient measures 9.2 mmHg. Pulmonic Valve: The pulmonic valve was not well visualized. Pulmonic valve regurgitation is not visualized. Aorta: The aortic root is normal in size and structure. Venous: The inferior vena cava is dilated in size with greater than 50% respiratory variability, suggesting right atrial pressure of 8 mmHg. IAS/Shunts: The atrial septum is grossly normal.  LEFT VENTRICLE PLAX 2D LVIDd:         4.50 cm   Diastology LVIDs:         2.90 cm   LV e' medial:    6.84 cm/s LV PW:         1.00 cm   LV E/e' medial:  13.9 LV IVS:        0.80 cm   LV e' lateral:   11.70  cm/s LVOT diam:     2.10 cm   LV E/e' lateral: 8.1 LV SV:         67 LV SV Index:   34 LVOT Area:     3.46 cm  RIGHT VENTRICLE             IVC RV S prime:     16.60 cm/s  IVC diam: 2.80 cm TAPSE (M-mode): 2.2 cm LEFT ATRIUM             Index        RIGHT ATRIUM           Index LA diam:        2.30 cm 1.17 cm/m   RA Area:     10.50 cm LA Vol (A2C):   37.7 ml 19.25 ml/m  RA Volume:   17.60 ml  8.98 ml/m LA Vol (A4C):   50.9 ml 25.98 ml/m LA Biplane Vol: 44.5 ml 22.72 ml/m  AORTIC VALVE AV Area (Vmax): 2.32 cm AV Vmax:        152.00 cm/s AV Peak Grad:   9.2 mmHg LVOT Vmax:      101.80 cm/s LVOT Vmean:     64.000 cm/s LVOT VTI:       0.194 m  AORTA Ao Root diam: 3.40 cm Ao Asc diam:  2.90 cm MITRAL VALVE                TRICUSPID VALVE MV Area (PHT): 3.21 cm     TR Peak grad:   4.3 mmHg MV Area VTI:   1.55 cm     TR Vmax:        104.00 cm/s MV Peak grad:  8.2 mmHg MV Mean grad:  3.5 mmHg     SHUNTS MV Vmax:       1.44 m/s     Systemic VTI:  0.19 m MV Vmean:  81.9 cm/s    Systemic Diam: 2.10 cm MV Decel Time: 236 msec MV E velocity: 94.90 cm/s MV A velocity: 138.00 cm/s MV E/A ratio:  0.69 Jodelle Red MD Electronically signed by Jodelle Red MD Signature Date/Time: 05/03/2023/2:11:43 PM    Final    DG Finger Index Left  Result Date: 05/01/2023 CLINICAL DATA:  Left index finger pain and swelling with clinical symptoms of cellulitis. Diabetes. EXAM: LEFT INDEX FINGER 2+V COMPARISON:  None Available. FINDINGS: Marked diffuse soft tissue swelling of the index finger with dorsal soft tissue irregularity distally with overlying bandage material. Bone destruction and fragmentation involving the distal aspect of the 2nd distal phalanx. 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 Extensive erosive and cystic changes involving all of the carpals, bases of the metacarpals and distal radius and ulna. Are  also radiocarpal degenerative changes and distal radius and ulna fragmentation. IMPRESSION: 1. Osteomyelitis involving the distal aspect of the 2nd distal phalanx with dorsal soft tissue ulceration/irregularity. 2. 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 Electronically Signed   By: Beckie Salts M.D.   On: 05/01/2023 16:38    DISCHARGE EXAMINATION: Vitals:   05/09/23 0700 05/09/23 1257 05/09/23 1917 05/10/23 0527  BP: 127/69 114/66 116/60 120/62  Pulse: 82 78 79 (!) 52  Resp: 17 18 18 18   Temp: 98.5 F (36.9 C) 97.9 F (36.6 C) 98.6 F (37 C) 98.5 F (36.9 C)  TempSrc: Oral  Oral Oral  SpO2: 99% 94% 95% 94%  Weight:      Height:       General appearance: Awake alert.  In no distress Resp: Clear to auscultation bilaterally.  Normal effort Cardio: S1-S2 is normal regular.  No S3-S4.  No rubs murmurs or bruit GI: Abdomen is soft.  Nontender nondistended.  Bowel sounds are present normal.  No masses organomegaly   DISPOSITION: SNF  Discharge Instructions     Call MD for:  extreme fatigue   Complete by: As directed    Call MD for:  persistant dizziness or light-headedness   Complete by: As directed    Call MD for:  persistant nausea and vomiting   Complete by: As directed    Call MD for:  redness, tenderness, or signs of infection (pain, swelling, redness, odor or green/yellow discharge around incision site)   Complete by: As directed    Call MD for:  severe uncontrolled pain   Complete by: As directed    Call MD for:  temperature >100.4   Complete by: As directed    Discharge instructions   Complete by: As directed    Please review instructions on the discharge summary.  You were cared for by a hospitalist during your hospital stay. If you have any questions about your discharge medications or the care you received while you were in the hospital after you are discharged, you can call the unit and asked to speak with the  hospitalist on call if the hospitalist that took care of you is not available. Once you are discharged, your primary care physician will handle any further medical issues. Please note that NO REFILLS for any discharge medications will be authorized once you are discharged, as it is imperative that you return to your primary care physician (or establish a relationship with a primary care physician if you do not have one) for your aftercare needs so  that they can reassess your need for medications and monitor your lab values. If you do not have a primary care physician, you can call [phone] for a physician referral.   Home infusion instructions   Complete by: As directed    Planning discharge to SNF   Instructions: Flushing of vascular access device: 0.9% NaCl pre/post medication administration and prn patency; Heparin 100 u/ml, 5ml for implanted ports and Heparin 10u/ml, 5ml for all other central venous catheters.   Increase activity slowly   Complete by: As directed    No wound care   Complete by: As directed           Allergies as of 05/10/2023       Reactions   Lipitor [atorvastatin] Other (See Comments)   unknown        Medication List     STOP taking these medications    amLODipine 10 MG tablet Commonly known as: NORVASC       TAKE these medications    Accu-Chek SmartView test strip Generic drug: glucose blood USE 1 STRIP TO CHECK GLUCOSE TWICE DAILY AS DIRECTED   albuterol 108 (90 Base) MCG/ACT inhaler Commonly known as: VENTOLIN HFA Inhale into the lungs.   daptomycin IVPB Commonly known as: CUBICIN Inject 650 mg into the vein daily. Indication:  Osteo of left hand First Dose: Yes Last Day of Therapy:  06/14/23 Labs - Once weekly:  CBC/D, BMP, and CPK Labs - Once weekly: ESR and CRP Method of administration: IV Push or per SNF protocol Please pull PIC at completion of IV antibiotics Method of administration may be changed at the discretion of home infusion  pharmacist based upon assessment of the patient and/or caregiver's ability to self-administer the medication ordered.   fexofenadine 180 MG tablet Commonly known as: ALLEGRA Take 180 mg by mouth daily.   furosemide 20 MG tablet Commonly known as: LASIX TAKE 1 TABLET BY MOUTH EVERY DAY   guaiFENesin 600 MG 12 hr tablet Commonly known as: MUCINEX Take 1 tablet (600 mg total) by mouth 2 (two) times daily.   insulin glargine-yfgn 100 UNIT/ML injection Commonly known as: SEMGLEE Inject 0.05 mLs (5 Units total) into the skin daily.   isosorbide mononitrate 60 MG 24 hr tablet Commonly known as: IMDUR Take 1 tablet (60 mg total) by mouth daily. Please make overdue appt with Dr. Eldridge Dace before anymore refills. Thank you 3rd and Final Attempt   LORazepam 0.5 MG tablet Commonly known as: ATIVAN Take 0.5 tablets (0.25 mg total) by mouth 2 (two) times daily as needed for anxiety.   nitroGLYCERIN 0.4 MG SL tablet Commonly known as: NITROSTAT Place 1 tablet (0.4 mg total) under the tongue every 5 (five) minutes as needed for chest pain. Please keep upcoming appt in May with Dr. Eldridge Dace. Thank you   pravastatin 40 MG tablet Commonly known as: PRAVACHOL Take 40 mg by mouth daily.   sodium chloride 0.65 % Soln nasal spray Commonly known as: OCEAN Place 2 sprays into both nostrils 2 (two) times daily.   tamsulosin 0.4 MG Caps capsule Commonly known as: FLOMAX Take 0.4 mg by mouth in the morning and at bedtime.   traZODone 50 MG tablet Commonly known as: DESYREL Take 0.5 tablets (25 mg total) by mouth at bedtime as needed for sleep.               Home Infusion Instuctions  (From admission, onward)           Start  Ordered   05/10/23 0000  Home infusion instructions       Comments: Planning discharge to SNF  Question:  Instructions  Answer:  Flushing of vascular access device: 0.9% NaCl pre/post medication administration and prn patency; Heparin 100 u/ml, 5ml for  implanted ports and Heparin 10u/ml, 5ml for all other central venous catheters.   05/10/23 0865              Contact information for follow-up providers     Mack Hook, MD Follow up.   Specialty: Orthopedic Surgery Why: As needed Contact information: [address] [place] [address] [phone]         Darlis Loan C, DO Follow up.   Specialty: Family Medicine Contact information: [phone] [place] [address] [phone]              Contact information for after-discharge care     Destination     [facility] .   Service: Skilled Nursing Contact information: [address] [place] [address] [phone]                     TOTAL DISCHARGE TIME:  Osvaldo Shipper  [facility] Pager on [email or web address]  05/10/2023, 8:56 AM

## 2023-05-10 NOTE — Plan of Care (Signed)
  Problem: Education: Goal: Ability to describe self-care measures that may prevent or decrease complications (Diabetes Survival Skills Education) will improve Outcome: Progressing   Problem: Coping: Goal: Ability to adjust to condition or change in health will improve Outcome: Progressing   Problem: Skin Integrity: Goal: Risk for impaired skin integrity will decrease Outcome: Progressing   Problem: Tissue Perfusion: Goal: Adequacy of tissue perfusion will improve Outcome: Progressing

## 2023-05-10 NOTE — Care Management Important Message (Signed)
Important Message  Patient Details  Name: Christian [name] MRN: [id number] Date of Birth: [birth year]   Medicare Important Message Given:  Yes     Sherilyn Banker 05/10/2023, 12:50 PM

## 2023-05-10 NOTE — Progress Notes (Signed)
Report given to Immunologist at [facility].

## 2023-05-10 NOTE — TOC Transition Note (Signed)
Transition of Care [facility]) - CM/SW Discharge Note   Patient Details  Name: Christian [name] MRN: [id number] Date of Birth: 1940-10-04  Transition of Care [facility]) CM/SW Contact:  Lorri Frederick, LCSW Phone Number: 05/10/2023, 9:45 AM   Clinical Narrative:   Pt discharging to [facility].  RN call report to [phone].  Per SNF request, [facility] has been set up for noon.     0935: Message from [facility].  They can receive pt today, room will be ready at 1pm.    Final next level of care: Skilled Nursing Facility Barriers to Discharge: Barriers Resolved   Patient Goals and CMS Choice CMS Medicare.gov Compare Post Acute Care list provided to:: Patient Choice offered to / list presented to : Patient  Discharge Placement                Patient chooses bed at:  [facility]) Patient to be transferred to facility by: [facility] Name of family member notified: daughter Crystal Patient and family notified of of transfer: 05/10/23  Discharge Plan and Services Additional resources added to the After Visit Summary for   In-house Referral: Clinical Social Work   Post Acute Care Choice: Skilled Nursing Facility                               Social Determinants of Health (SDOH) Interventions SDOH Screenings   Tobacco Use: Low Risk  (05/07/2023)     Readmission Risk Interventions     No data to display

## 2023-05-12 LAB — GLUCOSE, CAPILLARY: Glucose-Capillary: 322 mg/dL — ABNORMAL HIGH (ref 70–99)

## 2023-05-14 LAB — CULTURE, BLOOD (ROUTINE X 2)

## 2023-05-28 ENCOUNTER — Ambulatory Visit (INDEPENDENT_AMBULATORY_CARE_PROVIDER_SITE_OTHER): Payer: Medicare Other | Admitting: Internal Medicine

## 2023-05-28 ENCOUNTER — Encounter: Payer: Self-pay | Admitting: Internal Medicine

## 2023-05-28 ENCOUNTER — Telehealth: Payer: Self-pay

## 2023-05-28 ENCOUNTER — Other Ambulatory Visit: Payer: Self-pay

## 2023-05-28 VITALS — BP 126/72 | HR 79 | Temp 97.7°F

## 2023-05-28 DIAGNOSIS — M869 Osteomyelitis, unspecified: Secondary | ICD-10-CM | POA: Diagnosis not present

## 2023-05-28 NOTE — Telephone Encounter (Signed)
Spoke with patient's daughter Christian [name] to let her know that I spoke with the charge nurse Christian [name] at [facility] and she stated that Christian [name] will not be discharged from the facility until he finishes his IV abx. Christian [name] verbalized understanding and had no further questions.

## 2023-05-28 NOTE — Telephone Encounter (Signed)
Patient's daughter called office today stating patient is scheduled to discharge tomorrow at from  [facility]. Would like to know if MD is able to give orders to have patient stay until he completes Iv antiobiotics.  Family is not able to help with IV antibiotics due to work schedules.  Is requesting call back after reviewing message. Juanita Laster, RMA

## 2023-05-28 NOTE — Telephone Encounter (Signed)
Per Dr. Thedore Mins extend patient IV abx till seen on 10/8. Called [facility] [phone]) and spoke with charge nurse Crystal whom I relayed orders to and she read back orders and verbalized understanding. Also, verified that patient is not being discharged from facility until he finished IV abx.

## 2023-05-28 NOTE — Patient Instructions (Addendum)
Christian Hook, MD Follow up. ->please f/u prior to 10/8 visit with ID  Specialty: Orthopedic Surgery Why: As needed Contact information: [address] [place] [address] [phone]   -Extend dapto till 10/8/ -F/U with ID on 10/8

## 2023-05-28 NOTE — Progress Notes (Signed)
Patient: Christian [name]  DOB: [birth year] MRN: [id number] PCP: Montez Hageman, DO      Patient Active Problem List   Diagnosis Date Noted   Osteomyelitis (HCC) 05/01/2023   Metabolic acidosis 05/01/2023   Hyponatremia 05/01/2023   Chronic heart failure with preserved ejection fraction (HFpEF) (HCC) 05/01/2023   Decreased functional mobility and endurance 06/15/2021   Pressure injury of skin 03/15/2021   Wound cellulitis 03/12/2021   Acute-on-chronic kidney injury (HCC) 03/12/2021   Fall at home, initial encounter 09/14/2020   Chronic kidney disease, stage III (moderate) (HCC) 09/11/2020   Gastro-esophageal reflux disease with esophagitis 03/16/2020   Intractable vomiting with nausea 03/13/2020   Decreased activities of daily living (ADL) 02/26/2020   Dysphagia 02/26/2020   Impaired functional mobility, balance, gait, and endurance 02/26/2020   Acute respiratory failure with hypoxia (HCC) 10/27/2019   Sepsis (HCC) 10/21/2019   Generalized anxiety disorder 09/01/2019   Primary insomnia 09/01/2019   GERD without esophagitis 07/23/2019   Meningioma (HCC) 07/23/2019   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   Ulcer of left foot, limited to breakdown of skin (HCC) 02/07/2018   Chronic pain of both shoulders 08/09/2017   Pain in right wrist 08/09/2017   Atherosclerosis of renal artery (HCC) 04/24/2017   Type 2 diabetes mellitus (HCC) 04/24/2017   Chronic left-sided low back pain with left-sided sciatica 07/24/2016   Lower extremity edema 01/28/2015   Angina pectoris (HCC) 07/08/2014   Normocytic anemia 05/06/2014   Acute gastric ulcer 08/27/2013   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 male with hearing difficulties requires ASL times daily, uses sign language  interpreter, hypertension, hyperlipidemia, diabetes,, Charcot Marie tooth disease, CAD, PAD, heart failure, CKD CT, history of TMA of left foot, gastric ulcers, IDA, GERD present presents for hospital follow-up of MRSA bacteremia secondary to (X infection. - Patient was admitted [facility] on 8/20-29.  Blood cultures on admission grew MRSA, he was initially started on vancomycin but transition to Dapto given CKD.  Taken to the OR 8/22 for I&D and left index finger amputation level of MPJ, cultures grew MRSA as well.  TTE showed thickened aortic valve, TEE no vegetation.  On 8/24 seen by orthopedics, recommends repeat I&D. - Discharged on daptomycin x 6 weeks EOT 06/24/2023. -Patient presents today at 9/16 with daughter and interpreter.  States he is tolerating antibiotics.  Has a PICC line in place.  Has not seen orthopedics since discharge.  Review of Systems  All other systems reviewed and are negative.   Past Medical History:  Diagnosis Date   Angina pectoris [name])    Nuclear stress test 8/18: EF 59, fixed inf-lat defect likely attenuation, no ischemia, Low Risk   Arthritis    CMT (Charcot-Marie-Tooth disease)    Deafness    Diverticulosis    DM (diabetes mellitus) (HCC)    HLD (hyperlipidemia)    HTN (hypertension)    Iron deficiency anemia    Multiple gastric ulcers    Overactive bladder    Ulcer     Outpatient Medications Prior to Visit  Medication Sig Dispense Refill   ACCU-CHEK SMARTVIEW test strip USE 1 STRIP TO CHECK GLUCOSE TWICE DAILY AS DIRECTED     albuterol (VENTOLIN HFA) 108 (90 Base) MCG/ACT inhaler Inhale into  the lungs.     daptomycin (CUBICIN) IVPB Inject 650 mg into the vein daily. Indication:  Osteo of left hand First Dose: Yes Last Day of Therapy:  06/14/23 Labs - Once weekly:  CBC/D, BMP, and CPK Labs - Once weekly: ESR and CRP Method of administration: IV Push or per SNF protocol Please pull PIC at completion of IV antibiotics Method of administration may  be changed at the discretion of home infusion pharmacist based upon assessment of the patient and/or caregiver's ability to self-administer the medication ordered. 37 Units 0   fexofenadine (ALLEGRA) 180 MG tablet Take 180 mg by mouth daily.     furosemide (LASIX) 20 MG tablet TAKE 1 TABLET BY MOUTH EVERY DAY 90 tablet 1   guaiFENesin (MUCINEX) 600 MG 12 hr tablet Take 1 tablet (600 mg total) by mouth 2 (two) times daily.     insulin glargine-yfgn (SEMGLEE) 100 UNIT/ML injection Inject 0.05 mLs (5 Units total) into the skin daily.     isosorbide mononitrate (IMDUR) 60 MG 24 hr tablet Take 1 tablet (60 mg total) by mouth daily. Please make overdue appt with Dr. Eldridge Dace before anymore refills. Thank you 3rd and Final Attempt 15 tablet 0   LORazepam (ATIVAN) 0.5 MG tablet Take 0.5 tablets (0.25 mg total) by mouth 2 (two) times daily as needed for anxiety. 15 tablet 0   nitroGLYCERIN (NITROSTAT) 0.4 MG SL tablet Place 1 tablet (0.4 mg total) under the tongue every 5 (five) minutes as needed for chest pain. Please keep upcoming appt in May with Dr. Eldridge Dace. Thank you 75 tablet 0   pravastatin (PRAVACHOL) 40 MG tablet Take 40 mg by mouth daily.     sodium chloride (OCEAN) 0.65 % SOLN nasal spray Place 2 sprays into both nostrils 2 (two) times daily.     tamsulosin (FLOMAX) 0.4 MG CAPS capsule Take 0.4 mg by mouth in the morning and at bedtime.     traZODone (DESYREL) 50 MG tablet Take 0.5 tablets (25 mg total) by mouth at bedtime as needed for sleep.     No facility-administered medications prior to visit.     Allergies  Allergen Reactions   Lipitor [Atorvastatin] Other (See Comments)    unknown    Social History   Tobacco Use   Smoking status: Never   Smokeless tobacco: Never  Substance Use Topics   Alcohol use: No   Drug use: No    Family History  Problem Relation Age of Onset   Cervical cancer Mother        mets   Breast cancer Sister        mets   Diabetes Sister    Diabetes  Brother    Heart disease Sister    Cervical cancer Sister     Objective:  There were no vitals filed for this visit. There is no height or weight on file to calculate BMI.  Physical Exam Constitutional:      General: He is not in acute distress.    Appearance: He is normal weight. He is not toxic-appearing.  HENT:     Head: Normocephalic and atraumatic.     Right Ear: External ear normal.     Left Ear: External ear normal.     Nose: No congestion or rhinorrhea.     Mouth/Throat:     Mouth: Mucous membranes are moist.     Pharynx: Oropharynx is clear.  Eyes:     Extraocular Movements: Extraocular movements intact.  Conjunctiva/sclera: Conjunctivae normal.     Pupils: Pupils are equal, round, and reactive to light.  Cardiovascular:     Rate and Rhythm: Normal rate and regular rhythm.     Heart sounds: No murmur heard.    No friction rub. No gallop.  Pulmonary:     Effort: Pulmonary effort is normal.     Breath sounds: Normal breath sounds.  Abdominal:     General: Abdomen is flat. Bowel sounds are normal.     Palpations: Abdomen is soft.  Musculoskeletal:        General: No swelling.     Cervical back: Normal range of motion and neck supple.  Skin:    General: Skin is warm and dry.  Neurological:     Mental Status: He is oriented to person, place, and time.  Psychiatric:        Mood and Affect: Mood normal.     Lab Results: Lab Results  Component Value Date   WBC 10.4 05/09/2023   HGB 11.1 (L) 05/09/2023   HCT 34.8 (L) 05/09/2023   MCV 89.0 05/09/2023   PLT 335 05/09/2023    Lab Results  Component Value Date   CREATININE 1.63 (H) 05/09/2023   BUN 46 (H) 05/09/2023   NA 137 05/09/2023   K 3.6 05/09/2023   CL 98 05/09/2023   CO2 27 05/09/2023    Lab Results  Component Value Date   ALT 21 05/01/2023   AST 27 05/01/2023   ALKPHOS 154 (H) 05/01/2023   BILITOT 0.9 05/01/2023     Assessment & Plan:  #MRSA bacteremia secondary to left finger  infection - Blood culture 8/20/2 MRSA, 8/21 both -Taken to the OR on 8/22 for 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  Zone of  amputation reddened, some degree of cellulitis, some subcutaneous fluid at the level.  Seen by Ortho on 8/24, noted no further surgical intervention recommended.  Or cultures grew MRSA - TTE showed thickened aortic valve.  TEE no vegetation - Discharge on daptomycin x 6 weeks from the OR EOT 06/14/2023 Plan: -Will extend antibiotics till 10/8.  Patient states via interpreter that he is tolerating antibiotics. - Follow-up with Ortho at least once prior to stopping antibiotics and ID visit on 10/8. wound appears to be closed and healing well.   #Medication monitoring 9/6: wbc 10k, ck 125, scr 1.43 -Added esr and crp to OPAT orders     OPAT  Diagnosis: MRSA osteomyelitis    Culture Result: MRSA   Allergies       Allergies  Allergen Reactions   Lipitor [Atorvastatin] Other (See Comments)      unknown        OPAT Orders Discharge antibiotics to be given via PICC line Discharge antibiotics: daptomycin 650mg  iv q 24 hrs daily  Per pharmacy protocol   End Date: 06/19/23   [facility] Care Per Protocol:   Home health RN for IV administration and teaching; PICC line care and labs.     Labs weekly while on IV antibiotics: X__ CBC with differential X__ BMP __ CMP _x_ CRP x__ ESR __ Vancomycin trough X__ CK   X__ Please pull PIC at completion of IV antibiotics __ Please leave PIC in place until doctor has seen patient or been notified   Fax weekly labs to [phone]   Clinic Follow Up Appt: 10/8   Danelle Earthly, MD [facility]  05/28/23  11:20 AM   I have personally spent 42 minutes involved in face-to-face and non-face-to-face activities for this patient on the day of the visit. Professional time spent includes the following activities: Preparing to see  the patient (review of tests), Obtaining and/or reviewing separately obtained history (admission/discharge record), Performing a medically appropriate examination and/or evaluation , Ordering medications/tests/procedures, referring and communicating with other health care professionals, Documenting clinical information in the EMR, Independently interpreting results (not separately reported), Communicating results to the patient/family/caregiver, Counseling and educating the patient/family/caregiver and Care coordination (not separately reported).

## 2023-06-04 LAB — FUNGAL ORGANISM REFLEX

## 2023-06-04 LAB — FUNGUS CULTURE WITH STAIN

## 2023-06-04 LAB — FUNGUS CULTURE RESULT

## 2023-06-08 ENCOUNTER — Telehealth: Payer: Self-pay | Admitting: Internal Medicine

## 2023-06-08 NOTE — Telephone Encounter (Signed)
[facility]'s daughter, Joni Reining, called with hopes to reschedule her father's appointment. He will be released from the facility on 10/8 so she does not believe he could do an appointment the same day. At the time, there is no availability until November and they rather only see Dr. Thedore Mins. Joni Reining would like to know if waiting until November is okay or if she should explore other options. She decided to cancel the 10/18 appointment for now. Monday and Tuesday are best. She can be reached at [phone].

## 2023-06-15 ENCOUNTER — Telehealth: Payer: Self-pay

## 2023-06-15 NOTE — Telephone Encounter (Signed)
Left a voicemail regarding rescheduling cancelled appointment. Per Dr. Thedore Mins, patient needs to be seen by 10/8 with a different provider due to IV therapy.

## 2023-06-19 ENCOUNTER — Ambulatory Visit: Payer: Medicare Other | Admitting: Internal Medicine

## 2023-07-12 LAB — ACID FAST CULTURE WITH REFLEXED SENSITIVITIES (MYCOBACTERIA): Acid Fast Culture: NEGATIVE

## 2024-02-23 ENCOUNTER — Other Ambulatory Visit: Payer: Self-pay | Admitting: Interventional Cardiology

## 2024-02-26 NOTE — Telephone Encounter (Signed)
 Sending to Dr. Lavonne Prairie to refuse or approve the following medication amlodipine  10 mg. Former doctor is no longer at [facility]. Please review ER visit on 05/01/2023. Pt hasn't been to the office since 05/02/2022.

## 2024-04-14 ENCOUNTER — Other Ambulatory Visit: Payer: Self-pay | Admitting: Interventional Cardiology

## 2024-04-21 ENCOUNTER — Other Ambulatory Visit: Payer: Self-pay | Admitting: Interventional Cardiology
